# Patient Record
Sex: Female | Born: 1960 | Hispanic: No | Marital: Single | Smoking: Never smoker
Health system: Southern US, Community
[De-identification: ages and names within clinical notes are randomized; demographics above are authoritative.]

## PROBLEM LIST (undated history)

## (undated) DIAGNOSIS — E119 Type 2 diabetes mellitus without complications: Secondary | ICD-10-CM

## (undated) DIAGNOSIS — I10 Essential (primary) hypertension: Secondary | ICD-10-CM

## (undated) DIAGNOSIS — I639 Cerebral infarction, unspecified: Secondary | ICD-10-CM

## (undated) HISTORY — DX: Essential (primary) hypertension: I10

## (undated) HISTORY — PX: ABDOMINAL HYSTERECTOMY: SHX81

## (undated) NOTE — *Deleted (*Deleted)
48 year old female status post stroke with left-sided weakness has been having episodic left-sided chest pain tonight.  Pain will last a few minutes before resolving.  She describes a tight feeling.  There is no associated dyspnea, nausea, diaphoresis.  She does have history of hypertension and there is a positive family history of premature coronary atherosclerosis.  ECG shows no acute process.

---

## 1997-11-30 ENCOUNTER — Other Ambulatory Visit: Admission: RE | Admit: 1997-11-30 | Discharge: 1997-11-30 | Payer: Self-pay | Admitting: Internal Medicine

## 1999-11-07 ENCOUNTER — Emergency Department (HOSPITAL_COMMUNITY): Admission: EM | Admit: 1999-11-07 | Discharge: 1999-11-07 | Payer: Self-pay | Admitting: Emergency Medicine

## 1999-11-07 ENCOUNTER — Encounter: Payer: Self-pay | Admitting: Emergency Medicine

## 2008-01-27 ENCOUNTER — Ambulatory Visit: Payer: Self-pay | Admitting: Pulmonary Disease

## 2008-01-27 ENCOUNTER — Ambulatory Visit: Payer: Self-pay | Admitting: Cardiovascular Disease

## 2008-01-27 ENCOUNTER — Inpatient Hospital Stay (HOSPITAL_COMMUNITY): Admission: EM | Admit: 2008-01-27 | Discharge: 2008-02-04 | Payer: Self-pay | Admitting: Emergency Medicine

## 2008-01-27 ENCOUNTER — Encounter (INDEPENDENT_AMBULATORY_CARE_PROVIDER_SITE_OTHER): Payer: Self-pay | Admitting: Internal Medicine

## 2008-01-27 ENCOUNTER — Ambulatory Visit: Payer: Self-pay | Admitting: Oncology

## 2008-02-18 ENCOUNTER — Ambulatory Visit: Payer: Self-pay | Admitting: Family Medicine

## 2008-02-22 ENCOUNTER — Ambulatory Visit: Payer: Self-pay | Admitting: *Deleted

## 2008-03-01 ENCOUNTER — Ambulatory Visit (HOSPITAL_COMMUNITY): Admission: RE | Admit: 2008-03-01 | Discharge: 2008-03-01 | Payer: Self-pay | Admitting: Family Medicine

## 2008-03-03 ENCOUNTER — Ambulatory Visit: Payer: Self-pay | Admitting: Pulmonary Disease

## 2008-03-03 DIAGNOSIS — R05 Cough: Secondary | ICD-10-CM | POA: Insufficient documentation

## 2008-03-03 DIAGNOSIS — R059 Cough, unspecified: Secondary | ICD-10-CM | POA: Insufficient documentation

## 2008-03-03 DIAGNOSIS — R0902 Hypoxemia: Secondary | ICD-10-CM | POA: Insufficient documentation

## 2008-03-09 ENCOUNTER — Encounter: Payer: Self-pay | Admitting: Obstetrics and Gynecology

## 2008-03-09 ENCOUNTER — Other Ambulatory Visit: Admission: RE | Admit: 2008-03-09 | Discharge: 2008-03-09 | Payer: Self-pay | Admitting: Obstetrics and Gynecology

## 2008-03-09 ENCOUNTER — Ambulatory Visit: Payer: Self-pay | Admitting: Obstetrics and Gynecology

## 2008-03-09 LAB — CONVERTED CEMR LAB
HCT: 33.8 % — ABNORMAL LOW (ref 36.0–46.0)
Hemoglobin: 10.6 g/dL — ABNORMAL LOW (ref 12.0–15.0)
MCHC: 31.4 g/dL (ref 30.0–36.0)
MCV: 82 fL (ref 78.0–100.0)
Platelets: 300 10*3/uL (ref 150–400)
RBC: 4.12 M/uL (ref 3.87–5.11)
RDW: 23.8 % — ABNORMAL HIGH (ref 11.5–15.5)
WBC: 9.4 10*3/uL (ref 4.0–10.5)

## 2008-03-21 ENCOUNTER — Telehealth: Payer: Self-pay | Admitting: Pulmonary Disease

## 2008-03-23 ENCOUNTER — Ambulatory Visit: Payer: Self-pay | Admitting: Obstetrics and Gynecology

## 2008-03-23 ENCOUNTER — Encounter: Payer: Self-pay | Admitting: Pulmonary Disease

## 2008-04-05 ENCOUNTER — Telehealth: Payer: Self-pay | Admitting: Pulmonary Disease

## 2008-05-02 ENCOUNTER — Ambulatory Visit: Payer: Self-pay | Admitting: Pulmonary Disease

## 2008-05-07 ENCOUNTER — Encounter: Payer: Self-pay | Admitting: Pulmonary Disease

## 2008-05-07 ENCOUNTER — Ambulatory Visit (HOSPITAL_BASED_OUTPATIENT_CLINIC_OR_DEPARTMENT_OTHER): Admission: RE | Admit: 2008-05-07 | Discharge: 2008-05-07 | Payer: Self-pay | Admitting: Pulmonary Disease

## 2008-05-09 ENCOUNTER — Ambulatory Visit: Payer: Self-pay | Admitting: Family Medicine

## 2008-05-09 LAB — CONVERTED CEMR LAB
ALT: 14 units/L (ref 0–35)
AST: 17 units/L (ref 0–37)
Albumin: 4 g/dL (ref 3.5–5.2)
Alkaline Phosphatase: 58 units/L (ref 39–117)
BUN: 11 mg/dL (ref 6–23)
Basophils Absolute: 0.1 10*3/uL (ref 0.0–0.1)
Basophils Relative: 1 % (ref 0–1)
CO2: 21 meq/L (ref 19–32)
Calcium: 11 mg/dL — ABNORMAL HIGH (ref 8.4–10.5)
Chloride: 106 meq/L (ref 96–112)
Cholesterol: 156 mg/dL (ref 0–200)
Creatinine, Ser: 0.58 mg/dL (ref 0.40–1.20)
Eosinophils Absolute: 0.3 10*3/uL (ref 0.0–0.7)
Eosinophils Relative: 3 % (ref 0–5)
Free T4: 0.96 ng/dL (ref 0.89–1.80)
Glucose, Bld: 93 mg/dL (ref 70–99)
HCT: 36 % (ref 36.0–46.0)
HDL: 49 mg/dL (ref >39)
Hemoglobin: 11.2 g/dL — ABNORMAL LOW (ref 12.0–15.0)
LDL Cholesterol: 91 mg/dL (ref 0–99)
Lymphocytes Relative: 24 % (ref 12–46)
Lymphs Abs: 2 10*3/uL (ref 0.7–4.0)
MCHC: 31.1 g/dL (ref 30.0–36.0)
MCV: 91.8 fL (ref 78.0–100.0)
Monocytes Absolute: 0.8 10*3/uL (ref 0.1–1.0)
Monocytes Relative: 9 % (ref 3–12)
Neutro Abs: 5.1 10*3/uL (ref 1.7–7.7)
Neutrophils Relative %: 62 % (ref 43–77)
Platelets: 319 10*3/uL (ref 150–400)
Potassium: 3.9 meq/L (ref 3.5–5.3)
RBC: 3.92 M/uL (ref 3.87–5.11)
RDW: 14.6 % (ref 11.5–15.5)
Sodium: 139 meq/L (ref 135–145)
TSH: 2.107 microintl units/mL (ref 0.350–4.50)
Total Bilirubin: 0.2 mg/dL — ABNORMAL LOW (ref 0.3–1.2)
Total CHOL/HDL Ratio: 3.2
Total Protein: 7.7 g/dL (ref 6.0–8.3)
Triglycerides: 78 mg/dL (ref <150)
VLDL: 16 mg/dL (ref 0–40)
Vit D, 1,25-Dihydroxy: 10 — ABNORMAL LOW (ref 30–89)
WBC: 8.2 10*3/uL (ref 4.0–10.5)

## 2008-05-10 ENCOUNTER — Ambulatory Visit: Payer: Self-pay | Admitting: Pulmonary Disease

## 2008-06-27 ENCOUNTER — Ambulatory Visit: Payer: Self-pay | Admitting: Pulmonary Disease

## 2008-06-27 DIAGNOSIS — G4733 Obstructive sleep apnea (adult) (pediatric): Secondary | ICD-10-CM | POA: Insufficient documentation

## 2008-06-27 DIAGNOSIS — J449 Chronic obstructive pulmonary disease, unspecified: Secondary | ICD-10-CM | POA: Insufficient documentation

## 2008-06-27 DIAGNOSIS — J4489 Other specified chronic obstructive pulmonary disease: Secondary | ICD-10-CM

## 2008-06-27 HISTORY — DX: Other specified chronic obstructive pulmonary disease: J44.89

## 2008-06-27 HISTORY — DX: Chronic obstructive pulmonary disease, unspecified: J44.9

## 2008-06-27 HISTORY — DX: Obstructive sleep apnea (adult) (pediatric): G47.33

## 2008-07-21 ENCOUNTER — Encounter: Payer: Self-pay | Admitting: Pulmonary Disease

## 2008-08-14 ENCOUNTER — Ambulatory Visit: Payer: Self-pay | Admitting: Pulmonary Disease

## 2008-08-31 ENCOUNTER — Ambulatory Visit: Payer: Self-pay | Admitting: Obstetrics & Gynecology

## 2008-09-06 ENCOUNTER — Encounter: Payer: Self-pay | Admitting: Pulmonary Disease

## 2008-09-11 ENCOUNTER — Encounter: Payer: Self-pay | Admitting: Pulmonary Disease

## 2008-09-12 ENCOUNTER — Encounter: Payer: Self-pay | Admitting: Pulmonary Disease

## 2008-10-03 ENCOUNTER — Inpatient Hospital Stay (HOSPITAL_COMMUNITY): Admission: RE | Admit: 2008-10-03 | Discharge: 2008-10-05 | Payer: Self-pay | Admitting: Obstetrics & Gynecology

## 2008-10-03 ENCOUNTER — Ambulatory Visit: Payer: Self-pay | Admitting: Obstetrics & Gynecology

## 2008-10-03 ENCOUNTER — Encounter: Payer: Self-pay | Admitting: Obstetrics & Gynecology

## 2008-11-22 ENCOUNTER — Ambulatory Visit: Payer: Self-pay | Admitting: Obstetrics & Gynecology

## 2009-08-29 IMAGING — NM NM PULM PERFUSION & VENT (REBREATHING & WASHOUT)
2 series · 12 of 12 positions shown · non-contrast
Comparison: No priors

CLINICAL DATA: Chest pain/hypoxemia

NUCLEAR MEDICINE VENTILATION AND PERFUSION SCAN
TECHNIQUE: Perfusion images were obtained in multiple projections
after intravenous injection of radiopharmaceutical. Sequential
dynamic ventilation images were obtained in standard planar
projections following inhalation of radiopharmaceutical.
Radiopharmaceutical: 8.2 mCi xenon 133 and 4.9 mCi technetium MAA

[Series 1: vq ventlung · 3.26mm/px · 6 of 17 frames shown (1 of 2)]
[frame 2/17  full-range]
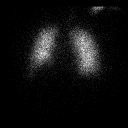
[frame 4/17  full-range]
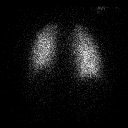
[frame 7/17  full-range]
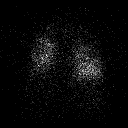
[frame 10/17]
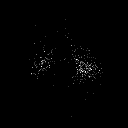
[frame 13/17]
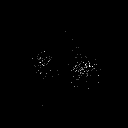
[frame 16/17]
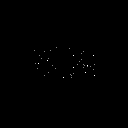

[Series 1: vq ventlung · 3.25mm/px · 6 of 17 frames shown (2 of 2)]
[frame 2/17  full-range]
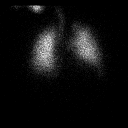
[frame 4/17  full-range]
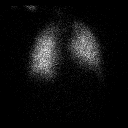
[frame 7/17  full-range]
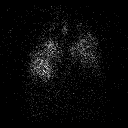
[frame 10/17]
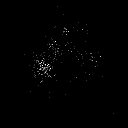
[frame 13/17]
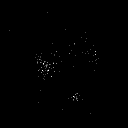
[frame 16/17]
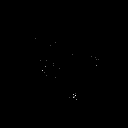

[12 of 12 positions shown; findings below may reference images not displayed]

FINDINGS: There is no significant perfusion defect noted.  There is
normal ventilatory wash in.  There is moderate retention of isotope
at the lung bases on the wash out phase, consistent with an element
of obstructive pulmonary disease.
IMPRESSION: 1.  No evidence for pulmonary emboli or other perfusion
abnormality.
2.  There is moderate retention of isotope at the lung bases
consistent with an element of obstructive pulmonary disease.

## 2009-12-19 ENCOUNTER — Telehealth: Payer: Self-pay | Admitting: Pulmonary Disease

## 2010-06-04 NOTE — Miscellaneous (Signed)
Summary: Overnight oximetry on CPAP 09/06/08  Clinical Lists Changes Test time 6hrs 10 min.  Lowest SpO2 88%, with 8 seconds of test time spent with SpO2 < 90.  Explained to patient over phone that CPAP is effective, but that she needs to be more compliant with therapy.

## 2010-06-04 NOTE — Progress Notes (Signed)
Summary: nos appt  Phone Note Call from Patient   Caller: [email or web address]  Call For: Kisean Rollo Summary of Call: LMTCB x2 to rsc nos from 8/16. Initial call taken by: Darletta Moll,  December 19, 2009 2:30 PM

## 2010-06-04 NOTE — Assessment & Plan Note (Signed)
Summary: f/u sleep results ///kp   PCP:     Chief Complaint:  Followup on sleep study results.  Pt denies any new complaints today.Marland Kitchen  History of Present Illness: I saw Haley Hernandez in hospital follow up for her obstructive lung disease, nocturnal hypoxemia, and to review her sleep test results.  Her breathing has been doing well.  She does not feel her breathing limits her activities in any way.  She denies cough, wheeze, sputum, or chest pain.  She has not needed to use her combivent for several months.  She had her sleep test from May 07, 2008.  This showed an AHI of 11, and oxygen nadir of 78%.    Current Allergies: ! PCN Past Medical History:    COPD         - PFT 05/02/08 FEV1 2.02 (90%), FVC 2.87 (98%), TLC 3.74 (89%), DLCO 81%    OSA         - PSG 05/07/08 AHI 11, O2 nadir 78%    Anemia    Substance abuse    ACE inhibitor cough    HTN    Fibroids   Vital Signs:  Patient Profile:   50 Years Old Female Height:     60 inches Weight:      165.50 pounds O2 Sat:      99 % O2 treatment:    Room Air Temp:     97.9 degrees F oral Pulse rate:   85 / minute BP sitting:   126 / 78  (left arm)  Vitals Entered By: Vernie Murders (June 27, 2008 9:48 AM)                Physical Exam  General:     normal appearance and obese.   Nose:     no deformity, discharge, inflammation, or lesions Mouth:     3+ tonsills, MP 3 airway Neck:     no JVD.   Lungs:     clear bilaterally to auscultation and percussion Heart:     regular rate and rhythm, S1, S2 without murmurs, rubs, gallops, or clicks Abdomen:     bowel sounds positive; abdomen soft and non-tender without masses, or organomegaly Extremities:     no clubbing, cyanosis, edema, or deformity noted Cervical Nodes:     no significant adenopathy   Impression & Recommendations:  Problem # 1:  OBSTRUCTIVE SLEEP APNEA (ICD-327.23) I reviewed her sleep test with her.  I explained how sleep apnea can affect her  health, particularly with regard to her hypertension.  The importance of weight control was reviewed.  Driving precautions were discussed.  I reviewed treatment options for her sleep apnea.  Will proceed with starting her on auto-CPAP titration.  Depending on her response she may need to have an in-lab titration study.  She may also need ENT evaluation for her tonsills if she has difficulty tolerating CPAP.  Problem # 2:  HYPOXEMIA (ICD-799.02) Her nocturnal hypoxemia is most likely related to her sleep disordered breathing, and is being addressed as stated above.  Problem # 3:  COPD (ICD-496) She has very little with regards to symptoms from her COPD.  I have advised her to continue with her as needed use of combivent.  Complete Medication List: 1)  Hydrochlorothiazide 25 Mg Tabs (Hydrochlorothiazide) .Marland Kitchen.. 1 once daily 2)  Norvasc 10 Mg Tabs (Amlodipine besylate) .Marland Kitchen.. 1 once daily 3)  Benicar 40 Mg Tabs (Olmesartan medoxomil) .Marland Kitchen.. 1 once daily 4)  Ferrous  Sulfate 325 (65 Fe) Mg Tabs (Ferrous sulfate) .Marland Kitchen.. 1 two times a day 5)  Combivent 103-18 Mcg/act Aero (Ipratropium-albuterol) .... 2 puffs three times a day as needed 6)  Megestrol Acetate 20 Mg Tabs (Megestrol acetate) .... Once daily  Patient Instructions: 1)  Will set up CPAP machine at home 2)  follow up in 6 to 8 weeks

## 2010-06-04 NOTE — Procedures (Signed)
Summary: Oximetry/[facility]  Oximetry/[facility]   Imported By: Lester Clearfield 06/14/2008 13:26:11  _____________________________________________________________________  External Attachment:    Type:   Image     Comment:   External Document

## 2010-06-04 NOTE — Miscellaneous (Signed)
Summary: Orders Update pft charges  Clinical Lists Changes  Orders: Added new Service order of Carbon Monoxide diffusing w/capacity (94720) - Signed Added new Service order of Lung Volumes (94240) - Signed Added new Service order of Spirometry (Pre & Post) (94060) - Signed 

## 2010-06-04 NOTE — Progress Notes (Signed)
Summary: returning  call from vs  Phone Note Call from Patient   Caller: Patient Call For: Haley Hernandez Summary of Call: pt returning call from dr Karryn Kosinski.  Initial call taken by: Tivis Ringer,  April 05, 2008 4:16 PM  Follow-up for Phone Call        Explained to patient about intermittent oxygen desaturation during ONO, and concern for sleep apnea.  She is agreeable to doing sleep test.  Will set this up, and arrange follow up after review of PSG. Follow-up by: Coralyn Helling MD,  April 10, 2008 2:37 PM

## 2010-06-04 NOTE — Progress Notes (Signed)
Summary: pt wants o2 picked up  Phone Note Call from Patient Call back at Home Phone [phone]   Caller: Patient Call For: Haley Hernandez Summary of Call: pt wants [facility] to pick up her oxygen Initial call taken by: Lacinda Axon,  March 21, 2008 11:00 AM  Follow-up for Phone Call        Spoke with pt.  She states that she never uses o2 and VS is aware of this.  She is requesting that we send order to DME to have tanks picked up.  Having overnight oximetry done tonight.  I advised that we sould probably wait until VS reviews results of overnight before we d/c o2, but pt wants tanks picked up asap.  Please advise if this is okay, thanks Follow-up by: Vernie Murders,  March 21, 2008 11:06 AM  Additional Follow-up for Phone Call Additional follow up Details #1::        Has she had her overnight oximetry done?  I would like to review this first.  If we do not have a copy, then can you please have [facility] send a copy of the report. Additional Follow-up by: Coralyn Helling MD,  March 22, 2008 12:01 AM    Additional Follow-up for Phone Call Additional follow up Details #2::    Pt states she will have ONO tonight. She understands that VS wants to review these results before making a decision about her oxygen. We will ask VS to contact the patient once he receives results of ONO. Follow-up by: Michel Bickers CMA,  March 22, 2008 1:51 PM  Additional Follow-up for Phone Call Additional follow up Details #3:: Details for Additional Follow-up Action Taken: Reviewed ONO on RA.  She does have intermittent episodes of oxygen desaturation.  I am concerned that she may have sleep apnea, and will need a sleep study to further evaluate this.  I have left a message for the patient to call and discuss this further. Additional Follow-up by: Coralyn Helling MD,  April 04, 2008 12:40 PM

## 2010-06-04 NOTE — Assessment & Plan Note (Signed)
Summary: HFU PER PT///KP   Visit Type:  Hospital Follow-up PCP:  None  Chief Complaint:  Followup after hospital discharge.  She c/o body aches x 6 days.  Breathing is well.  No other complaints..  History of Present Illness: I saw Haley Hernandez in hospital follow up for her obstructive lung disease.  She has been doing okay since hospital discharge.  She continues to use supplemental oxygen at night.  She has been using combivent two times a day, and this helps.  She feels her breathing has improved.  She denies using cocaine since her hospitalization.    Serial Vital Signs/Assessments:  Comments: 12:19 PM Ambulatory Pulse Oximetry  Resting; HR__77___    02 Sat__97%ra___  Lap1 (185 feet)   HR__107___   02 Sat__94%ra___ Lap2 (185 feet)   HR__113___   02 Sat__92%ra___    Lap3 (185 feet)   HR__115___   02 Sat__92%ra__  _x__Test Completed without Difficulty ___Test Stopped due to:   By: Vernie Murders      Current Allergies (reviewed today): ! PCN  Past Medical History:    Obstructive lung disease    Anemia    Substance abuse    ACE inhibitor cough    HTN      Past Surgical History:    None   Family History:    Mother - DM, HTN    Brother - Lung cancer  Social History:    Never smoker     ETOH on occ    Single    children    Works in childcare     Vital Signs:  Patient Profile:   50 Years Old Female Weight:      160.13 pounds O2 Sat:      99 % O2 treatment:    Room Air Temp:     97.9 degrees F oral Pulse rate:   76 / minute BP sitting:   118 / 80  (left arm)  Vitals Entered By: Vernie Murders (March 03, 2008 11:47 AM)                 Physical Exam  General:     normal appearance.   Nose:     no deformity, discharge, inflammation, or lesions Mouth:     no deformity or lesions Neck:     no masses, thyromegaly, or abnormal cervical nodes Lungs:     clear bilaterally to auscultation and percussion Heart:     regular rate and  rhythm, S1, S2 without murmurs, rubs, gallops, or clicks Abdomen:     bowel sounds positive; abdomen soft and non-tender without masses, or organomegaly Extremities:     no clubbing, cyanosis, edema, or deformity noted      Impression & Recommendations:  Problem # 1:  DYSPNEA (ICD-786.05) Again I a concerned that she has reactive airways disease.  I have advised her to use her combivent as needed.  If she finds she needs to use this on a regular basis, then she may need to have further adjustments to her inhaler regimen.  I will re-schedule her for PFT's to further evaluate this. Orders: Est. Patient Level II (27253) Full Pulmomary Function Test (PFT)   Problem # 2:  HYPOXEMIA (ICD-799.02) Her oxygen level is normal at rest, and with exertion.   I will schedule her for an overnight oximetry on room air.  If this is abnormal, then she will need to have a sleep test. Orders: Est. Patient Level II (66440) Full Pulmomary Function  Test (PFT) DME Referral (DME)   Problem # 3:  COUGH DUE TO ACE INHIBITORS (ICD-786.2) This has improved since she was switched to an ARB.  Medications Added to Medication List This Visit: 1)  Hydrochlorothiazide 25 Mg Tabs (Hydrochlorothiazide) .Marland Kitchen.. 1 once daily 2)  Norvasc 10 Mg Tabs (Amlodipine besylate) .Marland Kitchen.. 1 once daily 3)  Benicar 40 Mg Tabs (Olmesartan medoxomil) .Marland Kitchen.. 1 once daily 4)  Ferrous Sulfate 325 (65 Fe) Mg Tabs (Ferrous sulfate) .Marland Kitchen.. 1 two times a day 5)  Combivent 103-18 Mcg/act Aero (Ipratropium-albuterol) .... 2 puffs three times a day 6)  Combivent 103-18 Mcg/act Aero (Ipratropium-albuterol) .... 2 puffs three times a day as needed  Complete Medication List: 1)  Hydrochlorothiazide 25 Mg Tabs (Hydrochlorothiazide) .Marland Kitchen.. 1 once daily 2)  Norvasc 10 Mg Tabs (Amlodipine besylate) .Marland Kitchen.. 1 once daily 3)  Benicar 40 Mg Tabs (Olmesartan medoxomil) .Marland Kitchen.. 1 once daily 4)  Ferrous Sulfate 325 (65 Fe) Mg Tabs (Ferrous sulfate) .Marland Kitchen.. 1 two times a  day 5)  Combivent 103-18 Mcg/act Aero (Ipratropium-albuterol) .... 2 puffs three times a day as needed   Patient Instructions: 1)  Overnight oxygen test at home 2)  Use combivent two puffs only as needed upto four times per day 3)  Will schedule breathing test (PFT) 4)  Follow up in one month   ]  Appended Document: HFU PER PT///KP Overnight oximetry from 03/22/08 on room air.  Lowest O2 59% although this may have been artifact.  She did however spend 10% of test with O2 level below 90%, and 3.4% of test with O2 below 88%.  I have left a message for the patient to call to discuss the possibility of undergoing an overnight polysomnogram to further evaluate her nocturnal hypoxemia, and the possibility of sleep apnea.

## 2010-08-12 LAB — CBC
HCT: 23.6 % — ABNORMAL LOW (ref 36.0–46.0)
HCT: 24.3 % — ABNORMAL LOW (ref 36.0–46.0)
Hemoglobin: 7.8 g/dL — CL (ref 12.0–15.0)
Hemoglobin: 8 g/dL — ABNORMAL LOW (ref 12.0–15.0)
MCHC: 32.8 g/dL (ref 30.0–36.0)
MCHC: 32.8 g/dL (ref 30.0–36.0)
MCV: 77 fL — ABNORMAL LOW (ref 78.0–100.0)
MCV: 78.3 fL (ref 78.0–100.0)
Platelets: 229 10*3/uL (ref 150–400)
Platelets: 262 10*3/uL (ref 150–400)
RBC: 3.02 MIL/uL — ABNORMAL LOW (ref 3.87–5.11)
RBC: 3.16 MIL/uL — ABNORMAL LOW (ref 3.87–5.11)
RDW: 23.2 % — ABNORMAL HIGH (ref 11.5–15.5)
RDW: 24 % — ABNORMAL HIGH (ref 11.5–15.5)
WBC: 15.1 10*3/uL — ABNORMAL HIGH (ref 4.0–10.5)
WBC: 9.4 10*3/uL (ref 4.0–10.5)

## 2010-08-12 LAB — TYPE AND SCREEN
ABO/RH(D): O POS
Antibody Screen: NEGATIVE

## 2010-08-12 LAB — ABO/RH: ABO/RH(D): O POS

## 2010-08-13 LAB — CBC
HCT: 26 % — ABNORMAL LOW (ref 36.0–46.0)
Hemoglobin: 8.4 g/dL — ABNORMAL LOW (ref 12.0–15.0)
MCHC: 32.1 g/dL (ref 30.0–36.0)
MCV: 77.6 fL — ABNORMAL LOW (ref 78.0–100.0)
Platelets: 237 10*3/uL (ref 150–400)
RBC: 3.36 MIL/uL — ABNORMAL LOW (ref 3.87–5.11)
RDW: 23.4 % — ABNORMAL HIGH (ref 11.5–15.5)
WBC: 6.6 10*3/uL (ref 4.0–10.5)

## 2010-08-13 LAB — BASIC METABOLIC PANEL
BUN: 4 mg/dL — ABNORMAL LOW (ref 6–23)
CO2: 26 mEq/L (ref 19–32)
Calcium: 10.3 mg/dL (ref 8.4–10.5)
Chloride: 107 mEq/L (ref 96–112)
Creatinine, Ser: 0.71 mg/dL (ref 0.4–1.2)
GFR calc Af Amer: >60 mL/min (ref >60)
GFR calc non Af Amer: >60 mL/min (ref >60)
Glucose, Bld: 101 mg/dL — ABNORMAL HIGH (ref 70–99)
Potassium: 3.8 mEq/L (ref 3.5–5.1)
Sodium: 137 mEq/L (ref 135–145)

## 2010-09-17 NOTE — Consult Note (Signed)
NAME:  Haley Hernandez, Haley Hernandez             ACCOUNT NO.:  [id number]   MEDICAL RECORD NO.:  [id number]          PATIENT TYPE:  INP   LOCATION:  1425                         FACILITY:  [facility]   PHYSICIAN:  Coralyn Helling, MD        DATE OF BIRTH:  [birth year]   DATE OF CONSULTATION:  02/02/2008  DATE OF DISCHARGE:                                 CONSULTATION   REFERRING PHYSICIAN:  Dr. Isidor Holts.   REASON FOR CONSULTATION:  Hypoxemia.   [name] is a 50 year old female who was admitted on September 24  with symptoms of chest pain in the setting of hypertensive urgency.  She  was also noted to be bronchospastic.  It was later revealed that she had  recently done cocaine, and this is demonstrated in her urine drug  screen.  She was also noted to have profound anemia which was felt to be  related to excessive menstrual bleeding.  She does say that she has  problems with chest tightness and wheezing even prior to these recent  events.  She was recovering from her chest pain and anemia when it was  noted that she had oxygen desaturation with ambulation.  An arterial  blood gas was done on room air which demonstrated hypoxemia.  She had a  CT scan of her chest done on September 24 which showed no evidence for  pulmonary embolism or pulmonary parenchymal disease.  She had an  echocardiogram done on September 24 which showed an ejection fraction of  70% with left ventricular hypertrophy and normal right ventricular  function.  She had a chest x-ray on September 29 which showed  cardiomegaly.  She had an arterial blood gas on September 29 which  showed a pH of 7.47, a pCO2 of 46, and a PaO2 of 50.  She also developed  a cough, and there is concern this may related to an ACE inhibitor which  was started for her blood pressure control, and this has been  subsequently switched to an angiotensin receptor blocker.   Her past medical history is unremarkable.   Past surgical history is unremarkable.   She was not on any outpatient  medications.   Her inpatient medications include:  1. Aspirin 81 mg daily.  2. Avelox 400 mg daily.  3. Benicar 40 mg daily.  4. Ferrous sulfate 325 mg b.i.d.  5. Hydrochlorothiazide 25 mg daily.  6. Norvasc 10 mg daily.  7. Protonix 40 mg b.i.d.   SHE HAD AN ALLERGY TO PENICILLIN WHICH CAUSES HER TO DEVELOP HIVES.   FAMILY HISTORY:  Significant for mother who had diabetes and  hypertension and brother had lung cancer.   PHYSICAL EXAMINATION:  She is seen in her hospital room.  She is awake,  alert, does not appear to be acute distress.  Blood pressure is 123/70,  heart rate is 84, respiratory rate 20, temperature is 97.5, oxygen  saturation 97% on 2 liters but was noted to drop to 86% on room air with  ambulation.  HEENT:  Pupils reactive.  External muscles intact.  There is no sinus  tenderness.  No nasal discharge.  No oral lesions.  She has scalloped  border of the tongue.  She has a Mallampati 3 airway.  There is no lymphadenopathy, no thyromegaly, no jugular distention.  HEART:  S1-S2 with an S4.  There is no murmur.  CHEST:  There was mildly prolonged expiratory phase, but there was no  wheezing or rales.  ABDOMEN:  Soft, nontender, positive bowel sounds.  EXTREMITIES:  She has STDs in place.  There is no edema, cyanosis or  clubbing.  NEUROLOGIC EXAM:  She had normal strength.   Hemoglobin 9.2, hematocrit 29.9, platelet count 123, WBC 9.8.  Sodium  137, potassium 2.9, chloride 103, CO2 29, BUN 7, creatinine 0.63,  glucose 103, AST 25, ALT 13, ALP 68, bilirubin 0.7, albumin 3.7.  Cardiac enzymes are negative.  Calcium is 10.9.  TSH was 0.81.   IMPRESSION:  Hypoxemia.  She does have evidence for diastolic  dysfunction on her echocardiogram.  Her CT scan of the chest which was  recently done was unremarkable.  She does have symptoms of reactive  airway disease, and this could be contributing to some of the findings  of hypoxemia.  What I  would recommend is to have her undergo full  pulmonary function tests to include spirometry pre- and post-  bronchodilators lung volume assessment as well as diffusing capacity.  I  will also arrange for her to undergo a VQ scan.  Then, after review of  these results, further recommendations will be made with regards to  additional diagnostic interventions or therapies for her hypoxemia.      Coralyn Helling, MD  Electronically Signed     VS/MEDQ  D:  02/02/2008  T:  02/03/2008  Job:  [id number]

## 2010-09-17 NOTE — Op Note (Signed)
NAME:  Haley Hernandez, Haley Hernandez             ACCOUNT NO.:  [id number]   MEDICAL RECORD NO.:  [id number]          PATIENT TYPE:  INP   LOCATION:  9302                          FACILITY:  [facility]   PHYSICIAN:  Allie Bossier, MD        DATE OF BIRTH:  13-Feb-1961   DATE OF PROCEDURE:  10/03/2008  DATE OF DISCHARGE:                               OPERATIVE REPORT   PREOPERATIVE DIAGNOSES:  1. Menorrhagia.  2. Fibroids.  3. Anemia.  4. Obesity.   POSTOPERATIVE DIAGNOSES:  1. Menorrhagia.  2. Fibroids.  3. Anemia.  4. Obesity.   PROCEDURE:  Total abdominal hysterectomy.   SURGEON:  Allie Bossier, MD   ASSISTANTMichele Mcalpine D. Okey Dupre, MD   ANESTHESIA:  GETA.   ANESTHESIOLOGIST:  Raul Del, MD   COMPLICATIONS:  None.   ESTIMATED BLOOD LOSS:  250 mL.   SPECIMEN:  Greatly enlarged uterus with fibroids.   FINDINGS:  Normal adnexa.   DETAILED PROCEDURE AND FINDINGS:  Preoperatively the risks, benefits,  alternatives of surgery were explained, understood, and accepted.  Consents were signed.  All questions were answered.  She was given her  preop antibiotics in the holding area.  In the operating room, general  anesthesia was applied without complication.  Her abdomen and vagina  were prepped and draped in usual sterile fashion.  Her bladder was  catheterized with a Foley.  This Foley catheter drained clear urine  throughout the case.  A transverse incision was made at the site of her  previous transverse scar.  Incision was carried down to the subcutaneous  tissue to the fascia.  Bleeding encountered was cauterized with the  Bovie.  Fascial incision was scored in the midline.  Fascial incision  was extended bilaterally and curved slightly upward.  The pyramidalis  muscles and approximately 50% of the rectus muscles were separated in  transverse fashion using electrosurgical technique.  The inferior  epigastric vessels were left intact.  The peritoneum was entered with  hemostats.  Peritoneal  incision was extended bilaterally and away from  the bladder with the Bovie.  The greatly enlarged uterus (palpable now  up to 1 cm below the umbilicus) was grasped and brought forth through  the incision where the proximal large 5 cm pedunculated fibroid  separated, but the remainder of the uterus was grossly enlarged with  multiple fibroids.  The utero-ovarian round ligaments were clamped with  Kocher clamps for uterine manipulation.  The adnexa appeared normal.  The utero-ovarian ligaments were then clamped, cut, and doubly ligated  with 2-0 Vicryl sutures used throughout this case unless otherwise  specified.  The round ligaments were clamped, cut, and ligated.  Bladder  flap was created anteriorly.  Bladder was kept well out of the operative  site.  At this point, the uterine vessels were skeletonized, they were  then clamped, cut, and doubly ligated.  The uterus and cervix were  separated from their pelvic attachments using clamp, cut, and ligate  technique.  A curved Heaney sutures were placed at the vaginal cuff  angle, these were ligated using  Heaney sutures as well.  The remainder  of the cervix was removed and noted to be intact.  Remainder of the  vaginal cuff was closed with a 0 Vicryl running locking suture.  Excellent hemostasis was noted.  The pelvis was irrigated with a liter  of warm normal saline.  All pedicles were again inspected and noted to  be hemostatic.  Please note that after removal of the uterus, I did  packed the bowel out of the operative site in order to visualize all the  pedicles as well as the ureters.  The ureters were functioning normally  and were normal size.  During the case, the uterus itself provided most  of the retraction of the bowel, but I did during the case packed the  bowel upwards with 3 moist lap sponges to assure the bowel was out of  the operative site.  She tolerated the procedure well.  After the pelvis  was irrigated, cleaned, and  dried, the rectus muscles and rectus fascia  were inspected.  These were hemostatic as well.  The fascia was closed  with a 0 PDS loop in a running nonlocking fashion.  No defects were  palpable.  The subcu tissue was irrigated, cleaned, and dried.  It was  then infiltrated with 30 mL of 0.5% Marcaine.  A subcuticular closure  was done with 3-0 Vicryl suture.  Steri-Strips were placed.  A Foley  catheter drained clear urine throughout.  She was extubated and taken to  recovery room in stable condition.      Allie Bossier, MD  Electronically Signed     MCD/MEDQ  D:  10/03/2008  T:  10/04/2008  Job:  [id number]

## 2010-09-17 NOTE — Procedures (Signed)
NAME:  Haley Hernandez, Haley Hernandez NO.:  [id number]   MEDICAL RECORD NO.:  [id number]          PATIENT TYPE:  OUT   LOCATION:  SLEEP CENTER                 FACILITY:  [facility]   PHYSICIAN:  Coralyn Helling, MD        DATE OF BIRTH:  [birth year]   DATE OF STUDY:  05/07/2008                            NOCTURNAL POLYSOMNOGRAM   REFERRING PHYSICIAN:  Coralyn Helling, MD   FACILITY:  [facility].   REFERRING PHYSICIAN:  Coralyn Helling, MD   INDICATION FOR STUDY:  [name] is a 50 year old female who has a  history of hypertension and excessive daytime sleepiness.  She was noted  to have nocturnal hypoxemia and overnight oximetry.  She was therefore  referred to the Sleep Lab for evaluation of hypersomnia with obstructive  sleep apnea.  Height is 5 feet, weight 254 pounds, neck size is 13.   EPWORTH SLEEPINESS SCORE:  Epworth score is 5.   MEDICATIONS:  Hydrochlorothiazide, Norvasc, Benicar, ferrous sulfate.   SLEEP ARCHITECTURE:  Total recording time was 425 minutes.  Total sleep  time was 336 minutes.  Sleep efficiency was 79%.  Sleep latency was 37.5  minutes.  REM latency was 32 minutes.  The study was notable for lack of  slow-wave sleep.  The patient slept in both the supine and nonsupine  position.   RESPIRATORY DATA:  The average respiratory rate was 18.  The overall  apnea-hypopnea index was 11.  The events were exclusively obstructive in  nature.  The REM apnea-hypopnea index was 33.  The non REM apnea-  hypopnea index was 6.4.  The supine apnea-hypopnea index was 17.  The  nonsupine apnea-hypopnea was 5.  Moderate snoring was noted by the  technician.   OXYGEN DATA:  The baseline oxygenation was 98%.  The oxygen saturation  nadir was 78%.  The patient spent a total of 5 minutes with an oxygen  saturation less than 90% and 3 minutes with an oxygen saturation less  than 88%.   CARDIAC DATA:  The average heart rate was 90 and the rhythm strip showed  normal sinus  rhythm with sinus tachycardia.   MOVEMENT-PARASOMNIA:  The periodic limb movement index was 1.8 and the  patient had one restroom trip.   IMPRESSIONS-RECOMMENDATIONS:  This study showed evidence for mild-to-  moderate obstructive sleep apnea with apnea-hypopnea index of 11 and  oxygen saturation nadir of 78%.   In addition to diet, exercise, and weight reduction, further therapeutic  interventions could include CPAP therapy, oral appliance, or surgical  intervention.      Coralyn Helling, MD  Diplomat, American Board of Sleep Medicine  Electronically Signed     VS/MEDQ  D:  05/10/2008 11:52:33  T:  05/11/2008 04:05:54  Job:  [id number]

## 2010-09-17 NOTE — Group Therapy Note (Signed)
NAME:  Haley Hernandez, Haley Hernandez NO.:  [id number]   MEDICAL RECORD NO.:  [id number]          PATIENT TYPE:  WOC   LOCATION:  [facility]                   FACILITY:  [facility]   PHYSICIAN:  Argentina Donovan, MD        DATE OF BIRTH:  [birth year]   DATE OF SERVICE:  03/09/2008                                  CLINIC NOTE   The patient is a 50 year old African American female who was referred  from medical doctor because of uterine fibroids and chronic  menometrorrhagia with dysmenorrhea.  Endometrial biopsy was done by  sounding the uterus to 14 cm without incident.  On pelvic examination  the abdomen is soft, flat, nontender, but the uterus is palpable three  fingerbreadths above the symphysis and markedly irregular.  External  genitalia normal, BUS within normal limits.  Vagina is clean and well  rugated.  The cervix is clean, parous and anterior.  Pap smear was taken  as well and bimanual reveals a uterus about [redacted] weeks gestational size,  markedly irregular in configuration.  Adnexa could not be well palpated.  Please refer to the ultrasound.  The patient also has been recently seen  with having had significant medical problems thinking she only had a  cold and ended up with a large medical workup which can be evaluated by  looking at the correspondence material in the chart with chronic  obstructive pulmonary disease and upper respiratory infection.  She also  had severe hypertension for which she is on treatment at this time.  Will have the patient return in 2 weeks for more detailed evaluation and  discussion of future possible hysterectomy.           ______________________________  Argentina Donovan, MD     PR/MEDQ  D:  03/09/2008  T:  03/09/2008  Job:  [id number]

## 2010-09-17 NOTE — Discharge Summary (Signed)
NAME:  Haley Hernandez, Haley Hernandez             ACCOUNT NO.:  [id number]   MEDICAL RECORD NO.:  [id number]          PATIENT TYPE:  INP   LOCATION:  1425                         FACILITY:  [facility]   PHYSICIAN:  Isidor Holts, M.D.  DATE OF BIRTH:  July 04, 1960   DATE OF ADMISSION:  01/27/2008  DATE OF DISCHARGE:  02/04/2008                               DISCHARGE SUMMARY   ADDENDUM:   PMD:  Unassigned.   For discharge diagnoses, refer to interim discharge summary dictated  February 01, 2008, by Dr. Jannette Spanner.  In addition:  1. Chronic obstructive pulmonary disease.  2. Upper respiratory tract infection.   For details of admission history and clinical course, as well as  procedures and consultations, refer to above-mentioned interim summary.  However, for the period from February 02, 2008, to February 04, 2008, the  following are pertinent:  The patient remained clinically stable, blood  pressure was brought under adequate control with a combination of  Hydrochlorothiazide, Norvasc and ARB.  The patient continued on iron  supplementation, hemoglobin remained stable.  She did develop symptoms  of upper respiratory tract infection, i.e. cough productive of  yellowish/greenish phlegm, for which she was commenced on oral Avelox,  and as of February 04, 2008, had completed 3 out of a planned 7-day course  of treatment, which will be continued on an outpatient basis.  Because  of persistent hypoxemia at rest and on exertion, a pulmonary  consultation was called, which was kindly provided by Dr. Coralyn Helling;  for details of this consultation, refer to consultation notes of  February 02, 2008.  He concluded that she did have features of reactive  airway disease and ordered a ventilation-perfusion lung scan and chest x-  ray on February 03, 2008.  Chest x-ray showed no acute cardiopulmonary  findings.  Ventilation-perfusion lung scan showed no evidence of  pulmonary emboli or other perfusion abnormality.   There was, however,  moderate extension of isotope at the lung bases consistent with an  element of obstructive pulmonary disease.  Dr. Craige Cotta has recommended  Combivent inhaler.  The patient clinically improved, although she was  unable to perform pulmonary function testing because she developed chest  pain during the course of this procedure, and it had to be discontinued.  She desaturated on ambulation, at between 86 and 88%.  However, over the  course of the next few days her oxygen saturation on room air improved,  so that she was saturating consistently above 90% as of February 03, 2008.  As matter of fact, in the morning of February 04, 2008, she was sating at  100% on room air.  She felt clinically much better and was keen to be  discharged.  Hemoglobin as of February 03, 2008, was 9.7 with hematocrit  of 31.7.   DISPOSITION:  The patient was on February 04, 2008, considered clinically  stable for discharge.  She was, therefore, discharged accordingly.   DIET:  Heart-healthy.   ACTIVITY:  As tolerated, otherwise within the limits imposed by  shortness of breath.   FOLLOW-UP INSTRUCTIONS:  The patient has been instructed to  follow up  with Dr. Coralyn Helling, pulmonary medicine, in 2 weeks.  Appropriate  information has been provided.  She has also been instructed to follow  up with her gynecologist at the GYN clinic. Arrangements have already  been put in place.   SPECIAL INSTRUCTIONS:  The patient has been arranged short-term home  oxygen.      Isidor Holts, M.D.  Electronically Signed     CO/MEDQ  D:  02/04/2008  T:  02/04/2008  Job:  [id number]   cc:   Coralyn Helling, MD  [address]  [place], [place] [address]

## 2010-09-17 NOTE — Group Therapy Note (Signed)
NAME:  Haley Hernandez, Haley Hernandez NO.:  [id number]   MEDICAL RECORD NO.:  [id number]          PATIENT TYPE:  WOC   LOCATION:  [facility]                   FACILITY:  [facility]   PHYSICIAN:  Allie Bossier, MD        DATE OF BIRTH:  06/26/1960   DATE OF SERVICE:                                  CLINIC NOTE   HISTORY:  Haley Hernandez is a 50 year old single black gravida 3, para 3  with 2 living children.  She is seen here as a surgical consult for her  complaint of uterine fibroids and dysmenorrhea.  She says that she has  been bleeding essentially since December and a hemoglobin was done on  March 09, 2008 and it was 10.6 with a normal platelet count.  She was  prescribed iron.  In the meantime, an ultrasound was done which showed  multiple uterine fibroids, the largest at the mid-lower uterine segment  and 5 cm in size.  Several of them are of submucosal position.  Her  uterus length is 13.7 cm.  Endometrial biopsy and Pap smear were done  along with a mammogram, all of these were normal and she wants a  hysterectomy.  We discussed leaving her ovaries in situ versus removing  them and the risks and benefits of each.  She has decided she would like  to keep her ovaries in situ.   PAST MEDICAL HISTORY:  1. Uterine fibroids.  2. Dysmenorrhea.  3. Sleep apnea (she is cared for by Dr. Craige Cotta).  4. Hypertension.  She is [facility] for at [facility].  5. She is overweight.  6. She is anemic.  7. Menometrorrhagia.   REVIEW OF SYSTEMS:  She has been abstinent since October 2009 due to the  continuous bleeding, but she has been monogamous with her boyfriend for  the last 8 years.  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  She  denies dyspareunia and is able to achieve an orgasm when she is sexually  active.  She does now use a CPAP machine for her sleep apnea.  She is  interested in weight loss, but has yet to do this.   MEDICATIONS:  1. Hydrochlorothiazide 25 mg daily.  2. Norvasc  10 mg daily.  3. Benicar 80 mg daily.  4. Vitamin D 125 mg daily.  5. Iron pills twice a day.  6. She keeps a Combivent inhaler with her, but rarely uses this.   PAST SURGICAL HISTORY:  Tubal ligation.   FAMILY HISTORY:  Negative for breast, GYN and colon malignancies.   ALLERGIES:  No latex allergies.   MEDICINE ALLERGIES:  PENICILLIN.   PHYSICAL EXAMINATION:  VITAL SIGNS:  Weight 166, height 5 feet, blood  pressure 166/88, pulse 103.  HEENT:  Normal.  BREAST:  Normal.  HEART:  Regular rate and rhythm.  LUNGS:  Clear to auscultation bilaterally.  ABDOMEN:  Obese, benign.  GU:  Her uterus is palpable up to about 3 cm below the umbilicus, but is  very mobile and I feel that it can be easily removed through a Maylard  incision.   ASSESSMENT/PLAN:  1. Symptomatic uterine fibroids with anemia.  2. Dysmenorrhea.  3. Menometrorrhagia.   She understands the risks associated with hysterectomy include, but are  not limited to infection, bleeding, damage to bowel, bladder, ureter and  death.  She wishes to proceed.      Allie Bossier, MD     MCD/MEDQ  D:  08/31/2008  T:  08/31/2008  Job:  [id number]

## 2010-09-17 NOTE — H&P (Signed)
NAME:  Haley Hernandez, Haley Hernandez NO.:  [id number]   MEDICAL RECORD NO.:  [id number]          PATIENT TYPE:  EMS   LOCATION:  ED                           FACILITY:  [facility]   PHYSICIAN:  Vania Rea, M.D. DATE OF BIRTH:  1960-12-07   DATE OF ADMISSION:  01/27/2008  DATE OF DISCHARGE:                              HISTORY & PHYSICAL   PRIMARY CARE PHYSICIAN:  Unassigned.   CHIEF COMPLAINT:  Chest pain, coughing and shortness of breath since  yesterday morning.   HISTORY OF PRESENT ILLNESS:  This is a 50 year old African American lady  with no medical follow-up and who takes no medications; but who has been  having menorrhagia for many years.  She has sought no medical help for  this.  In any event, yesterday morning she woke up with coughing,  shortness of breath and chest discomfort; associated with sore throat  also.  She thought it was some sort of virus and it would eventually  resolve without treatment; but, on retiring to bed yesterday evening she  started experiencing severe central chest pain that did not appear to  have any aggravating or relieving factors, and she decided to come to  the emergency room.   The patient, at her baseline, has severe dyspnea on exertion -- which is  climbing one flight of stairs and is on the whole a rather sedentary  person.  Her menorrhagia takes the form of 7 heavy days of menstruation,  during which she uses about 8 pads as well as tampons.  Additionally,  the patient says she used cocaine for the first time about 2 or 3 days  ago.  However, the patient's story of cocaine use is sort of shifting,  as the days she used it are shifting between Sunday, Monday and Tuesday  depending on how it is presented to her.  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   PAST MEDICAL HISTORY:  None.   MEDICATIONS:  None.   ALLERGIES:  PENICILLIN causes hives.   SOCIAL HISTORY:  As noted  above.   FAMILY HISTORY:  Significant for mother with diabetes and hypertension.  Brother with cancer of the lung.   REVIEW OF SYSTEMS:  Significant for headache and sore throat starting  yesterday morning.  Menorrhagia as noted above.  Dyspnea on exertion, as  noted above.  No history of swelling of the legs.  Other than this, a 10-  point review of systems is unremarkable.   PHYSICAL EXAMINATION:  A middle-aged African American lady lying in bed;  appears somewhat dyspneic, appears somewhat short of breath.  VITALS:  Temperature 97.4, pulse 122, respiration 22, blood pressure  204/102.  She is saturating at 98% on 2 liters.  Her pupils are round, equal and reactive.  Mucous membranes pale.  She  is anicteric.  She has no cervical lymphadenopathy or thyromegaly.  No  carotid bruit.  CHEST:  She has occasional rhonchi bilaterally.  CARDIOVASCULAR SYSTEM.  She is tachycardiac.  No murmurs heard.  ABDOMEN:  Obese and soft.  No masses are felt.  EXTREMITIES:  She has trace edema bilaterally.  She has 3+ pounding  pulses bilaterally.  CENTRAL NERVOUS SYSTEM:  Cranial nerves II-XII are grossly intact.  She  has no focal neurologic deficit.   LABS:  White count is elevated at 13.3, hemoglobin 6.8, MCV 61.3,  platelet count 57.  Sodium 133, potassium 3.6, chloride 102, CO2 26, BUN  5, creatinine 0.56, glucose 114.  Her stool is negative for occult  blood.  No urinalysis is seen.  Her cardiac enzymes shows undetectable  CK-MB, undetectable troponins and normal myoglobin.  Her urine drug  screen is positive for cocaine.  Her D-dimer is marginally elevated at  0.62.   ASSESSMENT:  1. Hypertensive urgency.  2. Severe anemia.  3. Cocaine abuse.  4. Chest pain associated with severe anemia and uncontrolled      hypertension, with cocaine abuse.  5. Thrombocytopenia of unclear etiology.  6. Acute bronchospasm without past history of asthma.   PLAN:  Will admit this lady to step down on a  nitroglycerin drip.  We  will give bronchodilators with Xopenex.  She has already had an anemia  panel drawn and transfusion is in process.  We will control her blood  pressure with nitroglycerin.  We will give sedatives with  benzodiazepines and will start Procardia.  She may benefit from an  OB/GYN evaluation, although her stage III hypertension would certainly  explain her menorrhagia.  Other plans as per orders.      Vania Rea, M.D.  Electronically Signed     LC/MEDQ  D:  01/27/2008  T:  01/27/2008  Job:  [id number]

## 2010-09-17 NOTE — Group Therapy Note (Signed)
NAME:  Haley Hernandez, Haley Hernandez NO.:  [id number]   MEDICAL RECORD NO.:  [id number]           PATIENT TYPE:   [facility]ATION:  [facility]                     FACILITY:   PHYSICIAN:  Johnella Moloney, MD        DATE OF BIRTH:  06-18-1960   DATE OF SERVICE:  03/22/2008                                  CLINIC NOTE   REASON FOR VISIT:  Patient is here for followup on endometrial biopsy  and Pap smear results.   HISTORY OF PRESENT ILLNESS:  [name] is a 50 year old  gravida 3, para 2-0-1-2 who presents today for further followup on  chronic menometrorrhagia with dysmenorrhea.  Approximately 2 weeks ago,  she had an endometrial biopsy done with Pap smear.  The endometrial  biopsy revealed benign early secretory phase endometrium and the Pap  smear was negative.  The patient previously had a pelvic ultrasound  which revealed a thickened endometrial complex with multiple uterine  fibroids as big as 5 cm and a uterus measuring 14 cm in length.  Given  her chronic dysmenorrhea, as well as moderate bleeding, it is  recommended the patient have either a hypothermic ablation or  potentially a total abdominal hysterectomy.   Blood pressure today is 146/84, heart rate 97, respiratory rate 20, temp  97.8.  Patient is 5 feet 0 inches tall and weighs 155 pounds.   ASSESSMENT AND PLAN:  Patient has uterine fibroids and dysmenorrhea  secondary to her uterine fibroids.  The patient does not have insurance  so she was instructed to fill out an application for financial aid and  submit for 5 financial counseling.  After she goes through this process,  she will meet for surgical evaluation to discuss the options of  hypothermic ablation versus a total abdominal hysterectomy.  Patient was  given a prescription of Megace 20 mg taken twice a day.  Of note, on her  previous visit, patient was found to have a hemoglobin of 10.6 with an  RDW of 23.8.  I discussed that with the patient and I  discussed the  importance of iron supplementation, as well as dietary iron.  The  patient was instructed to make a followup appointment after she has her  financial aid packet submitted.     ______________________________  Haley Hernandez, D.O.    ______________________________  Johnella Moloney, MD    MC/MEDQ  D:  03/23/2008  T:  03/23/2008  Job:  [id number]

## 2010-09-17 NOTE — Group Therapy Note (Signed)
NAME:  Haley Hernandez, Haley Hernandez             ACCOUNT NO.:  [id number]   MEDICAL RECORD NO.:  [id number]          PATIENT TYPE:  INP   LOCATION:  1425                         FACILITY:  [facility]   PHYSICIAN:  Michelene Gardener, MD    DATE OF BIRTH:  1961-01-13                                 PROGRESS NOTE   CURRENT DIAGNOSES:  1. Accelerated hypertension.  2. Iron-deficiency anemia secondary to heavy periods.  3. Thrombocytopenia.  4. History of cocaine abuse.  5. Hypoxemia.   DISCHARGE MEDICATIONS:  To be determined by discharging physician.   CONSULTATIONS:  1. Hematology consult.  2. Cardiology consult.  3. GYN consult.   PROCEDURES.:  1. Chest x-ray on Haley Hernandez 23 showed cardiomegaly.  2. CT angiography on Haley Hernandez 24 showed no evidence of PE and no      evidence of dissection.  3. Pelvic ultrasound Haley Hernandez 26 showed significant enlarged uterus      containing multiple uterine fibroids and marked thickened      endometrium around 2.7 cm in diameter.  4. Abdominal ultrasound Haley Hernandez 28 showed no acute problems.  5. Echocardiogram on Haley Hernandez 24 showed ejection fraction 70-80% with      no left ventricular wall motion abnormalities.   COURSE OF HOSPITALIZATION:  1. Accelerated hypertension.  This patient's blood pressure was      204/102 on the time of admission.  The patient was admitted to the      intensive care unit.  The patient was started on nitroglycerin      drip.  Oral medications were introduced.  Through the      hospitalization, medications have been titrated.  Nitroglycerin      drip was discontinued.  Oral medications including      hydrochlorothiazide, lisinopril and Norvasc were started.  As of      today, the patient has acceptable control of her blood pressure      with systolic blood pressure ranging between 120 up to 140.  I had      a prolonged discussion with her regarding compliance with her      medications and with medical follow-up, and the patient  voiced      understanding.  2. Anemia.  On the time of presentation, hemoglobin was 6.8.  Upon      further questioning, the patient has been having prolonged periods      with heavy bleeding during those periods.  Pelvic ultrasound was      done, and it showed multiple fibroids with thickened endometrium      suggestive of hyperplasia versus tumor.  I discussed the case with      Dr. Hyacinth Meeker ( the GYN doctor on call) who suggested to see her as an      outpatient in the GYN clinic.  I explained this to the patient.  I      gave her the number of the GYN clinic, and it was written in her      discharge sheet.  The patient will contact GYN clinic for possible      endometrial biopsy as an outpatient.  The patient was also      evaluated by hematology. The pt received initially 2 units of      packed RBCs.  Then, another unit was required later on during the      hospitalization.  The patient was also given IV iron by hematology.      Following that, I started the patient on ferrous sulfate 325 mg      twice daily.  Last hemoglobin was done on Haley Hernandez 29, and it was      9.3.  No bleeding in the hospital.  3. Thrombocytopenia.  As per hematology, this might be secondary to      iron-deficiency anemia versus ITP.  Platelets remain stable in the      hospital, and the patient does not need further investigation at      this time.  4. Hypoxemia.  The pt cont to require oxygen. Oxygen was taken today      9/25 and her saturation dropped to mid 80s.  I did an ABG on her      that showed hypoxemia with pO2 of 50 and oxygen of 84 on room air.      As mentioned above, the patient already underwent 2 chest x-rays      and CT angiography, and they were negative.  Also, echocardiogram      was done and again was negative.  Repeat CXR is ordered for today.      Will try to taper oxygen as tolerated. If pt remains hypoxic, then      she might need pulmonary evaluation.  5. Cocaine abuse.  The patient  was advised about the risk.   DISPOSITION:  This patient made very good improvement during the hospitalization.  Her  blood pressure is currently under control on 3 medications that include  Norvasc, lisinopril and hydrochlorothiazide.  The patient started to  develop cough, and I am not very quite sure that this is coming from  lung problem versus her lisinopril.  I ordered a chest x-ray for today  which is currently pending. If the CXR is neg and her cough continues,  then we will consider switching lisinopril to a different agent.  Regarding her anemia, she did not have bleeding during the hospital and  her hemoglobin has remained stable.  Arrangements were made to follow  with GYN clinic as an outpatient.  I think this patient will be able to  go home within 1-2 days if she is able to maintain good saturation on  room air.  If the patient remains hypoxic, then I would suggest  pulmonary evaluation.  Updated course of hospitalization and updated  discharge medications will be updated by the discharging physician   Total assessment time is 40 minutes.      Michelene Gardener, MD  Electronically Signed     NAE/MEDQ  D:  02/01/2008  T:  02/01/2008  Job:  [id number]

## 2010-09-17 NOTE — Consult Note (Signed)
NAME:  Haley Hernandez, ZEHRING NO.:  [id number]   MEDICAL RECORD NO.:  [id number]          PATIENT TYPE:  INP   LOCATION:  1235                         FACILITY:  [facility]   PHYSICIAN:  Verne Carrow, MDDATE OF BIRTH:  [birth year]   DATE OF CONSULTATION:  01/27/2008  DATE OF DISCHARGE:                                 CONSULTATION   CONSULTING PHYSICIAN:  [facility].   REASON FOR CONSULTATION:  Chest pain.   HISTORY OF PRESENT ILLNESS:  Ms. Vo is a pleasant 50 year old  African American female with no known past medical history except for  cocaine use, who was admitted earlier this morning with complaints of  coughing, shortness of breath, and substernal chest pain over the last  24 hours.  The patient states that she was in her normal state of good  health until yesterday morning, when she began to cough and noticed  associated dyspnea with substernal chest pain.  There was no radiation  of the pain or associated diaphoresis or nausea.  It sounds like the  patient mainly presented to the emergency department secondary to her  shortness of breath.  She denies having had any production of mucous  with her cough, fever, chills, near syncope, syncope, dysuria, or change  in her bowel habits.  She also denies any unilateral lower extremity  swelling or recent immobility like a long car trip.  Upon further  questioning, she tells me that her chest pain seems to be worsened with  deep inspiration.  There also seems to be a positional component to her  chest pain.  Currently, she states that her pain is very mild, but is  still present in the center of her chest.  Of note, on arrival to the  emergency department this morning, the patient was found to be  hypertensive with a blood pressure of 204/102.   PAST MEDICAL HISTORY:  Heavy menstrual cycles, but no other medical  problems.   PAST SURGICAL HISTORY:  Ankle surgery.   ALLERGIES:  PENICILLIN.   HOME  MEDICATIONS:  None.   CURRENT MEDICATIONS:  1. Norvasc 5 mg once daily.  2. Aspirin 81 mg once daily.  3. Hydrochlorothiazide 25 mg once daily.  4. Procardia 90 mg once daily.  5. Protonix 40 mg twice daily.   SOCIAL HISTORY:  The patient admits to using alcohol socially, but  denies the use of tobacco.  She denies to me having used any illicit  drugs.  However, the medical record states that she admitted earlier  today using cocaine over the last several days.  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  She has a 2 adult children that are  ages 62 and 38.   FAMILY HISTORY:  The patient's mother died from an unknown cause, but  had diabetes mellitus.  The patient's father and brother both died from  cancer.  There does not seem to be a family history of premature  coronary artery disease.   REVIEW OF SYSTEMS:  As stated in the history of present illness and is  otherwise negative.  PHYSICAL EXAMINATION:  VITALS:  Temperature 97.9, pulse 107, blood  pressure 173/83, and respiratory rate 16-20.  GENERAL:  Alert and oriented x3 in no acute distress.  NECK:  No JVD.  No carotid bruits.  No thyromegaly.  No lymphadenopathy.  OROPHARYNX:  Clear.  Mucous membranes moist.  LUNGS:  Clear to auscultation bilaterally without wheezes, rhonchi, or  crackles noted.  CARDIOVASCULAR:  Tachycardic, regular rhythm.  Systolic flow murmur  noted over the precordium.  There are no gallops or rubs noted.  ABDOMEN:  Soft and nontender.  Bowel sounds present.  EXTREMITIES:  No evidence of edema.  Pulses are 2+ in all extremities.   DIAGNOSTIC STUDIES:  1. Laboratory values on admission showed hemoglobin of 6.8.  Following      blood transfusion, her hemoglobin is 9.1.  White blood cell count      13.2, platelets 57,000; potassium 3.5, creatinine 0.7, calcium      10.1; INR 1.1, D-dimer 0.41, troponin negative x3.  2. A 12-lead electrocardiogram shows sinus tachycardia, but not       specific ST and T-wave changes.  3. CT angiogram of the chest shows no evidence of pulmonary embolism,      no evidence of aortic dissection or acute pulmonary process.   ASSESSMENT AND PLAN:  This is a 50 year old African American female  admitted with hypertensive urgency, tachycardia, found to be anemic, and  complaining of cough, shortness of breath, and chest pain.   Chest pain - I think that the patient's chest pain sounds atypical and  noncardiac in etiology.  Most likely, her chest pain is secondary to her  hypertensive urgency and anemia.  Atypical features of her chest pain  are it is worsened with inspiration and changes with position.  It has  also been constant over the last 24 hours.  Her cardiac enzymes have  been negative x3.  Her EKG is not suggestive of ischemia.  The  differential for her chest pain includes pulmonary embolism; however,  the patient had CT angiogram of her chest earlier that showed no  evidence of pulmonary embolus.  There is also no evidence of aortic  dissection on this study.  I agree with transfusing the patient with  packed red blood cells for her anemia.  Currently, she is on  nitroglycerin drip for hypertension.  Her echocardiogram has been  performed, the results are pending at this time.  We will follow up on  her echocardiogram.  We will make further recommendations at that time.  It is most likely that the patient would benefit from a stress test,  when her other issues including anemia and hypertension are more stable.  I feel that her tachycardia is most likely secondary to her anemia and  it should resolve when her blood counts are more stable.   The Cardiology team will continue to follow along with this very  pleasant lady.      Verne Carrow, MD  Electronically Signed     CM/MEDQ  D:  01/27/2008  T:  01/28/2008  Job:  [id number]

## 2010-09-17 NOTE — Consult Note (Signed)
NAME:  Haley Hernandez, Haley Hernandez NO.:  [id number]   MEDICAL RECORD NO.:  [id number]          PATIENT TYPE:  INP   LOCATION:  1235                         FACILITY:  [facility]   PHYSICIAN:  Samul Dada, M.D.DATE OF BIRTH:  1960/11/17   DATE OF CONSULTATION:  01/27/2008  DATE OF DISCHARGE:                                 CONSULTATION   REASON FOR CONSULTATION:  Anemia and low platelet count.   REQUESTING PHYSICIAN:  Michelene Gardener, MD.   HISTORY OF PRESENT ILLNESS:  Haley Hernandez is a 50 year old African  American female with no known prior medical history; been on no  medications, except cocaine which she has admitted to using for the  first time about 2-3 days ago  prior to admission.  Seen in the  emergency department with acute onset of shortness of breath, chest pain  and nonproductive cough.  A CT angio of the chest on January 27, 2008  was negative for PE, no acute findings.  Workup included a CBC, which  reveals severe anemia with an H&H of 6.8 and 23.6.  The platelets on  admission were 57.  She received transfusion of 3 units of packed RBCs,  with an elevation of her H&H to 9.1 and 30.3 respectively.  Her ferritin  was 1.  MCV 67.1.  Reticulocytes were normal at 28.9.  B12 386 and folic  acid greater than 20.  Hemoccult was negative.  The patient has a  history of heavy menses, with periods lasting 8 days.  Her last  menstrual period was on January 08, 2008.  The platelets were noticed  to be decreased at 57,000, with no other labs being available for  comparison.  The smear was ordered for review.  We were asked to see  her, with recommendations regarding her care.   PAST MEDICAL HISTORY:  1. Menorrhagia.  2. Recent history of cocaine use.   SURGERY:  None.   ALLERGIES:  PENICILLIN.   MEDICATIONS:  1. Procardia as directed.  2. Aspirin,  3. Valium.  4. Xopenex.  5. Solu-Medrol.  6. Morphine sulfate.  7. Nitroglycerin.  8. Protonix.  9.  Zofran.  10.Oxycodone.   REVIEW OF SYSTEMS:  Remarkable for dyspnea on exertion, nonproductive  cough, chest pain on admission which has now improved, occasional GERD  symptoms.  She has had fatigue, but no weight loss.  No decrease in  appetite.  The rest of the review of systems is negative.   FAMILY HISTORY:  Mother died with diabetes and hypertension.  Father  died with some type of cancer.  One brother alive with a history of  cancer.   SOCIAL HISTORY:  The patient is married.  She has 2 grown children, ages  77 and 76.  No tobacco history.  No alcohol history.  She admits to have  taken cocaine for the first time about 3 days prior to admission.  The  patient lives in [place].  She is Control and instrumentation engineer.   PHYSICAL EXAMINATION:  She is a moderately obese 50 year old African  American female in no acute distress, alert and oriented x3.  Blood  pressure 150/76, pulse 109, respirations 27, temperature 98.1, pulse  oximetry 98% on 2 liters.  HEENT:  Normocephalic, atraumatic.  Sclerae anicteric.  Oral cavity  without lesions or thrush.  NECK:  Supple.  No cervical or  supraclavicular masses.  LUNGS:  Remarkable for bilateral rhonchi.  No wheezing or rales.  No  axillary masses.  CARDIOVASCULAR:  Tachycardic; regular rate and rhythm  without murmurs, rubs or gallops.  Occasional ectopic beat.  ABDOMEN:  Soft, nontender.  Bowel sounds x4.  No hepatosplenomegaly.  No  spider veins.  EXTREMITIES:  No clubbing or cyanosis.  No edema.  No inguinal masses.  SKIN:  Without bruising or petechial rash.  BREASTS:  Not examined.  GU and RECTAL:  Deferred.  MUSCULOSKELETAL:  No spinal tenderness.  NEUROLOGIC:  Nonfocal.   LABS:  Hemoglobin 9.1, hematocrit 30.3, white count 13.2, platelets 57,  MCV 67.1, ANC 111.6, monocytes 0.5, lymphocytes 0.8, reticulocyte  absolute is normal at 28.9.  Folic acid greater than 20, B12 386, iron  12, TIBC 427, percent saturation 3, ferritin 1, D-dimer 0.6.   Sodium  133, potassium 3.6, BUN 5, creatinine 0.56, glucose 114, direct  bilirubin 0.1, indirect bilirubin 0.6, total bilirubin 0.7, alkaline  phosphatase 60, AST 25, ALT 13, total protein 7.8.  Albumin 3.7, TSH  pending.  Hemoccult negative.  Urine drug screen positive for cocaine on  January 27, 2008.  Homocysteine cardiac panel and lipid profile are  pending.  Troponin is negative.   ASSESSMENT/PLAN:  Dr. Arline Asp has seen and evaluated the patient and  reviewed the chart.  This is a 50 year old woman, asked to see for  evaluation of anemia secondary to iron deficiency related to heavy  menses.  The hemoglobin was 6.8, which has risen to 9.1 after units of  packed RBCs.  The etiology of the thrombocytopenia is unclear, could be  secondary to severe iron deficiency or ITP.  Will check her HIV status.  Will give IV iron in the morning.  Would check CBC again.  Her cocaine  use of may have been, as well, a cause of her thrombocytopenia.  Thank  you very much for allowing [place] the opportunity to participate in the care  of this patient.      Marlowe Kays, P.A.      Samul Dada, M.D.  Electronically Signed    SW/MEDQ  D:  01/28/2008  T:  01/28/2008  Job:  [id number]

## 2010-09-20 NOTE — Discharge Summary (Signed)
NAME:  Haley Hernandez, Haley Hernandez             ACCOUNT NO.:  [id number]   MEDICAL RECORD NO.:  [id number]          PATIENT TYPE:  INP   LOCATION:  9302                          FACILITY:  [facility]   PHYSICIAN:  Allie Bossier, MD        DATE OF BIRTH:  January 20, 1961   DATE OF ADMISSION:  10/03/2008  DATE OF DISCHARGE:  10/05/2008                               DISCHARGE SUMMARY   ADMISSION DIAGNOSES:  Symptomatic uterine fibroids and anemia  (hemoglobin 8.4).   DISCHARGE DIAGNOSES:  Symptomatic uterine fibroids and anemia  (hemoglobin 8.4).   CONDITION:  Stable.   DISPOSITION ON DIET:  As tolerated.   ACTIVITY:  As tolerated.  She is not to drive a car or have intercourse.  She is not to drive a car while taking narcotics, and she is not to have  intercourse until 6 weeks and after being seen in the office.   Followup in 6 weeks at the office p.r.n. or sooner.   MEDICATIONS:  Her home medications plus her new prescription of Percocet  325 mg/5 mg one p.o. q.4 h p.r.n. pain, #30, no refills.   HISTORY OF PRESENT ILLNESS AND HOSPITAL COURSE:  Haley Hernandez is a 50-  year-old black lady with symptomatic uterine fibroid.  She has been  having heavy bleeding and was seen in the GYN Clinic.  Her uterus on  ultrasound and physical exam was noted to be grossly enlarged (nearly up  to the umbilicus).  Her hemoglobin preoperatively was 8.4, postop day #1  it was 8.0 and postop day #2 it was 7.8.  She remained afebrile  throughout her hospital course.  Please note, she did undergo total  abdominal hysterectomy.  Findings included grossly enlarged uterus but  normal-appearing adnexa, they were certainly left in situ.  EBL was  approximately 250 mL.  By postoperative day #1, she was voiding without  dysuria, ambulating, tolerating p.o. without nausea or vomiting.  Physical exam on both days revealed a benign abdomen with normoactive  bowel sounds and some pain and no erythema of her incision.  She was  discharged home in stable condition, will follow up as above.      Allie Bossier, MD  Electronically Signed     MCD/MEDQ  D:  10/13/2008  T:  10/14/2008  Job:  [id number]

## 2011-02-03 LAB — BASIC METABOLIC PANEL
BUN: 4 — ABNORMAL LOW
BUN: 5 — ABNORMAL LOW
BUN: 7
BUN: 7
CO2: 20
CO2: 26
CO2: 26
CO2: 29
Calcium: 10.1
Calcium: 10.3
Calcium: 10.4
Calcium: 10.9 — ABNORMAL HIGH
Chloride: 102
Chloride: 102
Chloride: 102
Chloride: 103
Creatinine, Ser: 0.56
Creatinine, Ser: 0.63
Creatinine, Ser: 0.68
Creatinine, Ser: 0.71
GFR calc Af Amer: >60
GFR calc Af Amer: >60
GFR calc Af Amer: >60
GFR calc Af Amer: >60
GFR calc non Af Amer: >60
GFR calc non Af Amer: >60
GFR calc non Af Amer: >60
GFR calc non Af Amer: >60
Glucose, Bld: 103 — ABNORMAL HIGH
Glucose, Bld: 114 — ABNORMAL HIGH
Glucose, Bld: 121 — ABNORMAL HIGH
Glucose, Bld: 187 — ABNORMAL HIGH
Potassium: 3.1 — ABNORMAL LOW
Potassium: 3.5
Potassium: 3.6
Potassium: 3.9
Sodium: 133 — ABNORMAL LOW
Sodium: 134 — ABNORMAL LOW
Sodium: 135
Sodium: 137

## 2011-02-03 LAB — CBC
HCT: 23.6 — ABNORMAL LOW
HCT: 26.3 — ABNORMAL LOW
HCT: 26.4 — ABNORMAL LOW
HCT: 28.2 — ABNORMAL LOW
HCT: 29.9 — ABNORMAL LOW
HCT: 30.3 — ABNORMAL LOW
Hemoglobin: 6.8 — CL
Hemoglobin: 8 — ABNORMAL LOW
Hemoglobin: 8.1 — ABNORMAL LOW
Hemoglobin: 8.5 — ABNORMAL LOW
Hemoglobin: 9.1 — ABNORMAL LOW
Hemoglobin: 9.2 — ABNORMAL LOW
MCHC: 28.8 — ABNORMAL LOW
MCHC: 30.2
MCHC: 30.2
MCHC: 30.3
MCHC: 30.6
MCHC: 30.6
MCV: 61.8 — ABNORMAL LOW
MCV: 66.8 — ABNORMAL LOW
MCV: 67.1 — ABNORMAL LOW
MCV: 68.5 — ABNORMAL LOW
MCV: 68.9 — ABNORMAL LOW
MCV: 71.4 — ABNORMAL LOW
Platelets: 123 — ABNORMAL LOW
Platelets: 57 — ABNORMAL LOW
Platelets: 57 — ABNORMAL LOW
Platelets: 58 — ABNORMAL LOW
Platelets: 74 — ABNORMAL LOW
Platelets: 92 — ABNORMAL LOW
RBC: 3.82 — ABNORMAL LOW
RBC: 3.82 — ABNORMAL LOW
RBC: 3.95
RBC: 4.12
RBC: 4.19
RBC: 4.51
RDW: 22.7 — ABNORMAL HIGH
RDW: 24.9 — ABNORMAL HIGH
RDW: 25.4 — ABNORMAL HIGH
RDW: 26.8 — ABNORMAL HIGH
RDW: 26.9 — ABNORMAL HIGH
RDW: 29 — ABNORMAL HIGH
WBC: 11.1 — ABNORMAL HIGH
WBC: 11.4 — ABNORMAL HIGH
WBC: 13.2 — ABNORMAL HIGH
WBC: 13.3 — ABNORMAL HIGH
WBC: 8.8
WBC: 9.8

## 2011-02-03 LAB — BLOOD GAS, ARTERIAL
Acid-Base Excess: 8.8 — ABNORMAL HIGH
Bicarbonate: 33.2 — ABNORMAL HIGH
FIO2: 0.21
O2 Saturation: 84.9
Patient temperature: 37
TCO2: 30.7
pCO2 arterial: 46 — ABNORMAL HIGH
pH, Arterial: 7.472 — ABNORMAL HIGH
pO2, Arterial: 50.1 — ABNORMAL LOW

## 2011-02-03 LAB — CARDIAC PANEL(CRET KIN+CKTOT+MB+TROPI)
CK, MB: 4
CK, MB: 4.4 — ABNORMAL HIGH
CK, MB: 5.6 — ABNORMAL HIGH
Relative Index: 1.6
Relative Index: 1.8
Relative Index: 1.9
Total CK: 239 — ABNORMAL HIGH
Total CK: 253 — ABNORMAL HIGH
Total CK: 290 — ABNORMAL HIGH
Troponin I: 0.01
Troponin I: 0.02
Troponin I: 0.02

## 2011-02-03 LAB — LIPID PANEL
Cholesterol: 156
HDL: 68
LDL Cholesterol: 79
Total CHOL/HDL Ratio: 2.3
Triglycerides: 44
VLDL: 9

## 2011-02-03 LAB — CROSSMATCH
ABO/RH(D): O POS
Antibody Screen: NEGATIVE

## 2011-02-03 LAB — PREPARE RBC (CROSSMATCH)

## 2011-02-03 LAB — HAPTOGLOBIN: Haptoglobin: 136

## 2011-02-03 LAB — HEPATIC FUNCTION PANEL
ALT: 13
AST: 25
Albumin: 3.7
Alkaline Phosphatase: 60
Bilirubin, Direct: 0.1
Indirect Bilirubin: 0.6
Total Bilirubin: 0.7
Total Protein: 7.8

## 2011-02-03 LAB — RAPID URINE DRUG SCREEN, HOSP PERFORMED
Amphetamines: NOT DETECTED
Barbiturates: NOT DETECTED
Benzodiazepines: NOT DETECTED
Cocaine: POSITIVE — AB
Opiates: NOT DETECTED
Tetrahydrocannabinol: NOT DETECTED

## 2011-02-03 LAB — FERRITIN: Ferritin: 1 — ABNORMAL LOW (ref 10–291)

## 2011-02-03 LAB — DIFFERENTIAL
Basophils Absolute: 0.1
Basophils Relative: 1
Eosinophils Absolute: 0.3
Eosinophils Relative: 2
Lymphocytes Relative: 6 — ABNORMAL LOW
Lymphs Abs: 0.8
Monocytes Absolute: 0.5
Monocytes Relative: 4
Neutro Abs: 11.6 — ABNORMAL HIGH
Neutrophils Relative %: 87 — ABNORMAL HIGH

## 2011-02-03 LAB — DIC (DISSEMINATED INTRAVASCULAR COAGULATION)PANEL
Smear Review: NONE SEEN
aPTT: 28

## 2011-02-03 LAB — TSH: TSH: 0.814

## 2011-02-03 LAB — FOLATE: Folate: >20

## 2011-02-03 LAB — HIV 1/2 CONFIRMATION
HIV-1 antibody: NEGATIVE
HIV-2 Ab: NEGATIVE

## 2011-02-03 LAB — IRON AND TIBC
Iron: 12 — ABNORMAL LOW
Saturation Ratios: 3 — ABNORMAL LOW
TIBC: 427
UIBC: 415

## 2011-02-03 LAB — LACTATE DEHYDROGENASE: LDH: 145

## 2011-02-03 LAB — OCCULT BLOOD X 1 CARD TO LAB, STOOL: Fecal Occult Bld: NEGATIVE

## 2011-02-03 LAB — RETICULOCYTES
RBC.: 2.89 — ABNORMAL LOW
Retic Count, Absolute: 28.9
Retic Ct Pct: 1

## 2011-02-03 LAB — VITAMIN B12: Vitamin B-12: 386 (ref 211–911)

## 2011-02-03 LAB — POCT CARDIAC MARKERS
CKMB, poc: 2.4
CKMB, poc: <1 — ABNORMAL LOW
Myoglobin, poc: 101
Myoglobin, poc: 52.4
Troponin i, poc: <0.05
Troponin i, poc: <0.05

## 2011-02-03 LAB — DIC (DISSEMINATED INTRAVASCULAR COAGULATION) PANEL (NOT AT ARMC)
D-Dimer, Quant: 0.41
Fibrinogen: 288
INR: 1.1
Platelets: 53 — ABNORMAL LOW
Prothrombin Time: 15

## 2011-02-03 LAB — D-DIMER, QUANTITATIVE: D-Dimer, Quant: 0.62 — ABNORMAL HIGH

## 2011-02-03 LAB — HEMOGLOBIN AND HEMATOCRIT, BLOOD
HCT: 30.4 — ABNORMAL LOW
Hemoglobin: 9.3 — ABNORMAL LOW

## 2011-02-03 LAB — ABO/RH: ABO/RH(D): O POS

## 2011-02-03 LAB — SAVE SMEAR

## 2011-02-03 LAB — HOMOCYSTEINE: Homocysteine: 3.4 — ABNORMAL LOW

## 2011-02-04 LAB — BASIC METABOLIC PANEL
BUN: 11
CO2: 31
Calcium: 11.1 — ABNORMAL HIGH
Chloride: 99
Creatinine, Ser: 0.73
GFR calc Af Amer: >60
GFR calc non Af Amer: >60
Glucose, Bld: 93
Potassium: 3.7
Sodium: 136

## 2011-02-04 LAB — CBC
HCT: 31.7 — ABNORMAL LOW
Hemoglobin: 9.7 — ABNORMAL LOW
MCHC: 30.5
MCV: 73.3 — ABNORMAL LOW
Platelets: 294
RBC: 4.33
RDW: 33.4 — ABNORMAL HIGH
WBC: 10

## 2011-02-04 LAB — POCT PREGNANCY, URINE: Preg Test, Ur: NEGATIVE

## 2014-06-28 ENCOUNTER — Other Ambulatory Visit: Payer: Self-pay

## 2014-06-28 DIAGNOSIS — Z1231 Encounter for screening mammogram for malignant neoplasm of breast: Secondary | ICD-10-CM

## 2014-07-06 ENCOUNTER — Ambulatory Visit: Payer: Self-pay

## 2014-07-06 ENCOUNTER — Ambulatory Visit
Admission: RE | Admit: 2014-07-06 | Discharge: 2014-07-06 | Disposition: A | Discharge status: Home / Self Care | Payer: Self-pay | Source: Ambulatory Visit

## 2014-07-06 DIAGNOSIS — Z1231 Encounter for screening mammogram for malignant neoplasm of breast: Secondary | ICD-10-CM

## 2014-07-10 ENCOUNTER — Other Ambulatory Visit: Payer: Self-pay | Admitting: *Deleted

## 2014-07-10 ENCOUNTER — Other Ambulatory Visit: Payer: Self-pay | Admitting: Nurse Practitioner

## 2014-07-10 DIAGNOSIS — R928 Other abnormal and inconclusive findings on diagnostic imaging of breast: Secondary | ICD-10-CM

## 2014-07-11 ENCOUNTER — Other Ambulatory Visit: Payer: Self-pay | Admitting: Registered Nurse

## 2014-07-11 DIAGNOSIS — R928 Other abnormal and inconclusive findings on diagnostic imaging of breast: Secondary | ICD-10-CM

## 2014-07-12 ENCOUNTER — Other Ambulatory Visit: Payer: BLUE CROSS/BLUE SHIELD

## 2014-07-12 ENCOUNTER — Ambulatory Visit
Admission: RE | Admit: 2014-07-12 | Discharge: 2014-07-12 | Disposition: A | Discharge status: Home / Self Care | Payer: BLUE CROSS/BLUE SHIELD | Source: Ambulatory Visit | Attending: Registered Nurse | Admitting: Registered Nurse

## 2014-07-12 ENCOUNTER — Other Ambulatory Visit: Payer: Self-pay | Admitting: Registered Nurse

## 2014-07-12 DIAGNOSIS — R928 Other abnormal and inconclusive findings on diagnostic imaging of breast: Secondary | ICD-10-CM

## 2014-07-12 DIAGNOSIS — N6320 Unspecified lump in the left breast, unspecified quadrant: Secondary | ICD-10-CM

## 2014-07-12 DIAGNOSIS — N6310 Unspecified lump in the right breast, unspecified quadrant: Secondary | ICD-10-CM

## 2014-07-13 ENCOUNTER — Other Ambulatory Visit: Payer: Self-pay | Admitting: Registered Nurse

## 2014-07-13 DIAGNOSIS — N6320 Unspecified lump in the left breast, unspecified quadrant: Secondary | ICD-10-CM

## 2014-07-20 ENCOUNTER — Ambulatory Visit
Admission: RE | Admit: 2014-07-20 | Discharge: 2014-07-20 | Disposition: A | Discharge status: Home / Self Care | Payer: BLUE CROSS/BLUE SHIELD | Source: Ambulatory Visit | Attending: Registered Nurse | Admitting: Registered Nurse

## 2014-07-20 ENCOUNTER — Other Ambulatory Visit: Payer: Self-pay | Admitting: Registered Nurse

## 2014-07-20 DIAGNOSIS — N6310 Unspecified lump in the right breast, unspecified quadrant: Secondary | ICD-10-CM

## 2014-07-20 DIAGNOSIS — N6320 Unspecified lump in the left breast, unspecified quadrant: Secondary | ICD-10-CM

## 2019-07-04 ENCOUNTER — Ambulatory Visit: Payer: BLUE CROSS/BLUE SHIELD | Attending: Internal Medicine

## 2019-07-04 DIAGNOSIS — Z23 Encounter for immunization: Secondary | ICD-10-CM | POA: Insufficient documentation

## 2019-07-04 NOTE — Progress Notes (Signed)
   Covid-19 Vaccination Clinic  Name:  Maripaz Mullan    MRN: [id number] DOB: January 22, 1961  07/04/2019  Ms. Barresi was observed post Covid-19 immunization for 15 minutes without incidence. She was provided with Vaccine Information Sheet and instruction to access the V-Safe system.   Ms. Johns was instructed to call 911 with any severe reactions post vaccine: Marland Kitchen Difficulty breathing  . Swelling of your face and throat  . A fast heartbeat  . A bad rash all over your body  . Dizziness and weakness    Immunizations Administered    Name Date Dose VIS Date Route   Pfizer COVID-19 Vaccine 07/04/2019  4:18 PM 0.3 mL 04/15/2019 Intramuscular   Manufacturer: [employer], [employer]   Lot: [id number]   NDC: [address]-1

## 2019-07-18 ENCOUNTER — Encounter (HOSPITAL_COMMUNITY): Payer: Self-pay | Admitting: Neurology

## 2019-07-18 ENCOUNTER — Inpatient Hospital Stay (HOSPITAL_COMMUNITY)
Admission: EM | Admit: 2019-07-18 | Discharge: 2019-08-31 | DRG: 003 | Disposition: A | Discharge status: Other Acute Inpatient Hospital | Payer: 59 | Attending: Internal Medicine | Admitting: Internal Medicine

## 2019-07-18 ENCOUNTER — Inpatient Hospital Stay (HOSPITAL_COMMUNITY): Payer: 59

## 2019-07-18 ENCOUNTER — Emergency Department (HOSPITAL_COMMUNITY): Payer: 59

## 2019-07-18 DIAGNOSIS — J690 Pneumonitis due to inhalation of food and vomit: Secondary | ICD-10-CM | POA: Diagnosis not present

## 2019-07-18 DIAGNOSIS — Z20822 Contact with and (suspected) exposure to covid-19: Secondary | ICD-10-CM | POA: Diagnosis present

## 2019-07-18 DIAGNOSIS — E878 Other disorders of electrolyte and fluid balance, not elsewhere classified: Secondary | ICD-10-CM | POA: Diagnosis not present

## 2019-07-18 DIAGNOSIS — R4701 Aphasia: Secondary | ICD-10-CM | POA: Diagnosis present

## 2019-07-18 DIAGNOSIS — I615 Nontraumatic intracerebral hemorrhage, intraventricular: Principal | ICD-10-CM

## 2019-07-18 DIAGNOSIS — Z79899 Other long term (current) drug therapy: Secondary | ICD-10-CM

## 2019-07-18 DIAGNOSIS — I160 Hypertensive urgency: Secondary | ICD-10-CM | POA: Diagnosis present

## 2019-07-18 DIAGNOSIS — E871 Hypo-osmolality and hyponatremia: Secondary | ICD-10-CM | POA: Diagnosis not present

## 2019-07-18 DIAGNOSIS — T783XXA Angioneurotic edema, initial encounter: Secondary | ICD-10-CM | POA: Diagnosis not present

## 2019-07-18 DIAGNOSIS — J449 Chronic obstructive pulmonary disease, unspecified: Secondary | ICD-10-CM | POA: Diagnosis present

## 2019-07-18 DIAGNOSIS — G9389 Other specified disorders of brain: Secondary | ICD-10-CM | POA: Diagnosis present

## 2019-07-18 DIAGNOSIS — E876 Hypokalemia: Secondary | ICD-10-CM | POA: Diagnosis not present

## 2019-07-18 DIAGNOSIS — R1110 Vomiting, unspecified: Secondary | ICD-10-CM

## 2019-07-18 DIAGNOSIS — J9600 Acute respiratory failure, unspecified whether with hypoxia or hypercapnia: Secondary | ICD-10-CM

## 2019-07-18 DIAGNOSIS — E669 Obesity, unspecified: Secondary | ICD-10-CM | POA: Diagnosis present

## 2019-07-18 DIAGNOSIS — G8194 Hemiplegia, unspecified affecting left nondominant side: Secondary | ICD-10-CM | POA: Diagnosis present

## 2019-07-18 DIAGNOSIS — T461X5A Adverse effect of calcium-channel blockers, initial encounter: Secondary | ICD-10-CM | POA: Diagnosis not present

## 2019-07-18 DIAGNOSIS — Y92230 Patient room in hospital as the place of occurrence of the external cause: Secondary | ICD-10-CM | POA: Diagnosis not present

## 2019-07-18 DIAGNOSIS — Z978 Presence of other specified devices: Secondary | ICD-10-CM

## 2019-07-18 DIAGNOSIS — I639 Cerebral infarction, unspecified: Secondary | ICD-10-CM | POA: Diagnosis present

## 2019-07-18 DIAGNOSIS — Z930 Tracheostomy status: Secondary | ICD-10-CM | POA: Diagnosis not present

## 2019-07-18 DIAGNOSIS — D6489 Other specified anemias: Secondary | ICD-10-CM | POA: Diagnosis not present

## 2019-07-18 DIAGNOSIS — R001 Bradycardia, unspecified: Secondary | ICD-10-CM | POA: Diagnosis present

## 2019-07-18 DIAGNOSIS — J9601 Acute respiratory failure with hypoxia: Secondary | ICD-10-CM | POA: Diagnosis not present

## 2019-07-18 DIAGNOSIS — R739 Hyperglycemia, unspecified: Secondary | ICD-10-CM | POA: Diagnosis present

## 2019-07-18 DIAGNOSIS — R220 Localized swelling, mass and lump, head: Secondary | ICD-10-CM | POA: Diagnosis not present

## 2019-07-18 DIAGNOSIS — I610 Nontraumatic intracerebral hemorrhage in hemisphere, subcortical: Secondary | ICD-10-CM

## 2019-07-18 DIAGNOSIS — Z23 Encounter for immunization: Secondary | ICD-10-CM

## 2019-07-18 DIAGNOSIS — G936 Cerebral edema: Secondary | ICD-10-CM | POA: Diagnosis present

## 2019-07-18 DIAGNOSIS — R1310 Dysphagia, unspecified: Secondary | ICD-10-CM | POA: Diagnosis present

## 2019-07-18 DIAGNOSIS — Z880 Allergy status to penicillin: Secondary | ICD-10-CM

## 2019-07-18 DIAGNOSIS — Z789 Other specified health status: Secondary | ICD-10-CM | POA: Diagnosis not present

## 2019-07-18 DIAGNOSIS — Z6832 Body mass index (BMI) 32.0-32.9, adult: Secondary | ICD-10-CM

## 2019-07-18 DIAGNOSIS — Z8673 Personal history of transient ischemic attack (TIA), and cerebral infarction without residual deficits: Secondary | ICD-10-CM

## 2019-07-18 DIAGNOSIS — R4182 Altered mental status, unspecified: Secondary | ICD-10-CM | POA: Diagnosis not present

## 2019-07-18 DIAGNOSIS — I618 Other nontraumatic intracerebral hemorrhage: Secondary | ICD-10-CM | POA: Diagnosis present

## 2019-07-18 DIAGNOSIS — R1312 Dysphagia, oropharyngeal phase: Secondary | ICD-10-CM | POA: Diagnosis not present

## 2019-07-18 DIAGNOSIS — Q382 Macroglossia: Secondary | ICD-10-CM

## 2019-07-18 DIAGNOSIS — Z515 Encounter for palliative care: Secondary | ICD-10-CM

## 2019-07-18 DIAGNOSIS — G4733 Obstructive sleep apnea (adult) (pediatric): Secondary | ICD-10-CM | POA: Diagnosis present

## 2019-07-18 DIAGNOSIS — E870 Hyperosmolality and hypernatremia: Secondary | ICD-10-CM | POA: Diagnosis not present

## 2019-07-18 DIAGNOSIS — Z7189 Other specified counseling: Secondary | ICD-10-CM

## 2019-07-18 DIAGNOSIS — G9341 Metabolic encephalopathy: Secondary | ICD-10-CM | POA: Diagnosis not present

## 2019-07-18 DIAGNOSIS — J9690 Respiratory failure, unspecified, unspecified whether with hypoxia or hypercapnia: Secondary | ICD-10-CM

## 2019-07-18 DIAGNOSIS — T783XXD Angioneurotic edema, subsequent encounter: Secondary | ICD-10-CM | POA: Diagnosis not present

## 2019-07-18 DIAGNOSIS — Z888 Allergy status to other drugs, medicaments and biological substances status: Secondary | ICD-10-CM

## 2019-07-18 DIAGNOSIS — R29810 Facial weakness: Secondary | ICD-10-CM | POA: Diagnosis present

## 2019-07-18 DIAGNOSIS — J189 Pneumonia, unspecified organism: Secondary | ICD-10-CM

## 2019-07-18 DIAGNOSIS — R509 Fever, unspecified: Secondary | ICD-10-CM | POA: Diagnosis present

## 2019-07-18 DIAGNOSIS — Z4659 Encounter for fitting and adjustment of other gastrointestinal appliance and device: Secondary | ICD-10-CM

## 2019-07-18 DIAGNOSIS — G911 Obstructive hydrocephalus: Secondary | ICD-10-CM | POA: Diagnosis present

## 2019-07-18 HISTORY — DX: Nontraumatic intracerebral hemorrhage, intraventricular: I61.5

## 2019-07-18 LAB — I-STAT CHEM 8, ED
BUN: 13 mg/dL (ref 6–20)
Calcium, Ion: 1.24 mmol/L (ref 1.15–1.40)
Chloride: 102 mmol/L (ref 98–111)
Creatinine, Ser: 0.6 mg/dL (ref 0.44–1.00)
Glucose, Bld: 153 mg/dL — ABNORMAL HIGH (ref 70–99)
HCT: 41 % (ref 36.0–46.0)
Hemoglobin: 13.9 g/dL (ref 12.0–15.0)
Potassium: 3.3 mmol/L — ABNORMAL LOW (ref 3.5–5.1)
Sodium: 137 mmol/L (ref 135–145)
TCO2: 26 mmol/L (ref 22–32)

## 2019-07-18 LAB — COMPREHENSIVE METABOLIC PANEL
ALT: 20 U/L (ref 0–44)
AST: 21 U/L (ref 15–41)
Albumin: 4.1 g/dL (ref 3.5–5.0)
Alkaline Phosphatase: 62 U/L (ref 38–126)
Anion gap: 13 (ref 5–15)
BUN: 11 mg/dL (ref 6–20)
CO2: 24 mmol/L (ref 22–32)
Calcium: 10.4 mg/dL — ABNORMAL HIGH (ref 8.9–10.3)
Chloride: 99 mmol/L (ref 98–111)
Creatinine, Ser: 0.76 mg/dL (ref 0.44–1.00)
GFR calc Af Amer: >60 mL/min (ref >60)
GFR calc non Af Amer: >60 mL/min (ref >60)
Glucose, Bld: 157 mg/dL — ABNORMAL HIGH (ref 70–99)
Potassium: 3.6 mmol/L (ref 3.5–5.1)
Sodium: 136 mmol/L (ref 135–145)
Total Bilirubin: 0.9 mg/dL (ref 0.3–1.2)
Total Protein: 8.3 g/dL — ABNORMAL HIGH (ref 6.5–8.1)

## 2019-07-18 LAB — POCT I-STAT 7, (LYTES, BLD GAS, ICA,H+H)
Acid-Base Excess: 2 mmol/L (ref 0.0–2.0)
Bicarbonate: 29.8 mmol/L — ABNORMAL HIGH (ref 20.0–28.0)
Calcium, Ion: 1.44 mmol/L — ABNORMAL HIGH (ref 1.15–1.40)
HCT: 40 % (ref 36.0–46.0)
Hemoglobin: 13.6 g/dL (ref 12.0–15.0)
O2 Saturation: 98 %
Patient temperature: 95.7
Potassium: 3.5 mmol/L (ref 3.5–5.1)
Sodium: 138 mmol/L (ref 135–145)
TCO2: 32 mmol/L (ref 22–32)
pCO2 arterial: 57.1 mmHg — ABNORMAL HIGH (ref 32.0–48.0)
pH, Arterial: 7.317 — ABNORMAL LOW (ref 7.350–7.450)
pO2, Arterial: 107 mmHg (ref 83.0–108.0)

## 2019-07-18 LAB — CBC
HCT: 40.4 % (ref 36.0–46.0)
Hemoglobin: 13 g/dL (ref 12.0–15.0)
MCH: 28.1 pg (ref 26.0–34.0)
MCHC: 32.2 g/dL (ref 30.0–36.0)
MCV: 87.4 fL (ref 80.0–100.0)
Platelets: 227 10*3/uL (ref 150–400)
RBC: 4.62 MIL/uL (ref 3.87–5.11)
RDW: 13.2 % (ref 11.5–15.5)
WBC: 8.8 10*3/uL (ref 4.0–10.5)
nRBC: 0 % (ref 0.0–0.2)

## 2019-07-18 LAB — HIV ANTIBODY (ROUTINE TESTING W REFLEX): HIV Screen 4th Generation wRfx: NONREACTIVE

## 2019-07-18 LAB — DIFFERENTIAL
Abs Immature Granulocytes: 0.02 10*3/uL (ref 0.00–0.07)
Basophils Absolute: 0.1 10*3/uL (ref 0.0–0.1)
Basophils Relative: 1 %
Eosinophils Absolute: 0.3 10*3/uL (ref 0.0–0.5)
Eosinophils Relative: 4 %
Immature Granulocytes: 0 %
Lymphocytes Relative: 23 %
Lymphs Abs: 2 10*3/uL (ref 0.7–4.0)
Monocytes Absolute: 0.7 10*3/uL (ref 0.1–1.0)
Monocytes Relative: 8 %
Neutro Abs: 5.7 10*3/uL (ref 1.7–7.7)
Neutrophils Relative %: 64 %

## 2019-07-18 LAB — RESPIRATORY PANEL BY RT PCR (FLU A&B, COVID)
Influenza A by PCR: NEGATIVE
Influenza B by PCR: NEGATIVE
SARS Coronavirus 2 by RT PCR: NEGATIVE

## 2019-07-18 LAB — I-STAT BETA HCG BLOOD, ED (MC, WL, AP ONLY): I-stat hCG, quantitative: 5.8 m[IU]/mL — ABNORMAL HIGH (ref <5)

## 2019-07-18 LAB — APTT: aPTT: 23 seconds — ABNORMAL LOW (ref 24–36)

## 2019-07-18 LAB — PROTIME-INR
INR: 1 (ref 0.8–1.2)
Prothrombin Time: 12.6 seconds (ref 11.4–15.2)

## 2019-07-18 MED ORDER — ACETAMINOPHEN 325 MG PO TABS
650.0000 mg | ORAL_TABLET | ORAL | Status: DC | PRN
  Administered 2019-08-25: 650 mg via ORAL
  Filled 2019-07-18: qty 2

## 2019-07-18 MED ORDER — CLEVIDIPINE BUTYRATE 0.5 MG/ML IV EMUL
0.0000 mg/h | INTRAVENOUS | Status: DC
  Administered 2019-07-18: 1 mg/h via INTRAVENOUS
  Administered 2019-07-19: 16 mg/h via INTRAVENOUS
  Administered 2019-07-19: 12 mg/h via INTRAVENOUS
  Administered 2019-07-19: 18 mg/h via INTRAVENOUS
  Administered 2019-07-19: 1 mg/h via INTRAVENOUS
  Administered 2019-07-19: 20 mg/h via INTRAVENOUS
  Administered 2019-07-20: 6 mg/h via INTRAVENOUS
  Administered 2019-07-20: 8 mg/h via INTRAVENOUS
  Administered 2019-07-20: 16 mg/h via INTRAVENOUS
  Administered 2019-07-20: 8 mg/h via INTRAVENOUS
  Administered 2019-07-21: 12 mg/h via INTRAVENOUS
  Administered 2019-07-21: 8 mg/h via INTRAVENOUS
  Administered 2019-07-21: 22:00:00 13 mg/h via INTRAVENOUS
  Administered 2019-07-21: 21 mg/h via INTRAVENOUS
  Administered 2019-07-21: 10 mg/h via INTRAVENOUS
  Administered 2019-07-22: 1 mg/h via INTRAVENOUS
  Administered 2019-07-22: 16 mg/h via INTRAVENOUS
  Administered 2019-07-23: 3 mg/h via INTRAVENOUS
  Administered 2019-07-23: 18 mg/h via INTRAVENOUS
  Administered 2019-07-23: 20 mg/h via INTRAVENOUS
  Administered 2019-07-23: 8 mg/h via INTRAVENOUS
  Administered 2019-07-23: 18 mg/h via INTRAVENOUS
  Administered 2019-07-24: 21 mg/h via INTRAVENOUS
  Administered 2019-07-24: 19 mg/h via INTRAVENOUS
  Administered 2019-07-24: 21 mg/h via INTRAVENOUS
  Administered 2019-07-24: 18 mg/h via INTRAVENOUS
  Administered 2019-07-24: 09:00:00 19 mg/h via INTRAVENOUS
  Administered 2019-07-24: 21 mg/h via INTRAVENOUS
  Administered 2019-07-24: 18 mg/h via INTRAVENOUS
  Administered 2019-07-24: 19 mg/h via INTRAVENOUS
  Administered 2019-07-24: 14:00:00 20 mg/h via INTRAVENOUS
  Administered 2019-07-25: 13 mg/h via INTRAVENOUS
  Administered 2019-07-25: 2 mg/h via INTRAVENOUS
  Administered 2019-07-25: 9 mg/h via INTRAVENOUS
  Administered 2019-07-25: 15 mg/h via INTRAVENOUS
  Administered 2019-07-25: 19 mg/h via INTRAVENOUS
  Administered 2019-07-26: 6 mg/h via INTRAVENOUS
  Administered 2019-07-27: 3 mg/h via INTRAVENOUS
  Administered 2019-07-27: 10 mg/h via INTRAVENOUS
  Administered 2019-07-30 (×2): 4 mg/h via INTRAVENOUS
  Administered 2019-08-01: 09:00:00 5 mg/h via INTRAVENOUS
  Administered 2019-08-02: 6 mg/h via INTRAVENOUS
  Filled 2019-07-18: qty 50
  Filled 2019-07-18: qty 100
  Filled 2019-07-18 (×14): qty 50
  Filled 2019-07-18: qty 100
  Filled 2019-07-18 (×7): qty 50
  Filled 2019-07-18: qty 100
  Filled 2019-07-18 (×3): qty 50
  Filled 2019-07-18: qty 100
  Filled 2019-07-18 (×13): qty 50

## 2019-07-18 MED ORDER — ONDANSETRON HCL 4 MG/2ML IJ SOLN
INTRAMUSCULAR | Status: AC
  Filled 2019-07-18: qty 2

## 2019-07-18 MED ORDER — LABETALOL HCL 5 MG/ML IV SOLN
10.0000 mg | Freq: Once | INTRAVENOUS | Status: AC
  Administered 2019-07-18: 10 mg via INTRAVENOUS

## 2019-07-18 MED ORDER — CLEVIDIPINE BUTYRATE 0.5 MG/ML IV EMUL
INTRAVENOUS | Status: AC
  Filled 2019-07-18: qty 50

## 2019-07-18 MED ORDER — ACETAMINOPHEN 160 MG/5ML PO SOLN
650.0000 mg | ORAL | Status: DC | PRN
  Administered 2019-07-19 – 2019-08-31 (×36): 650 mg
  Filled 2019-07-18 (×37): qty 20.3

## 2019-07-18 MED ORDER — CHLORHEXIDINE GLUCONATE CLOTH 2 % EX PADS
6.0000 | MEDICATED_PAD | Freq: Every day | CUTANEOUS | Status: DC
  Administered 2019-07-20 – 2019-07-30 (×8): 6 via TOPICAL

## 2019-07-18 MED ORDER — ONDANSETRON HCL 4 MG/2ML IJ SOLN
4.0000 mg | Freq: Once | INTRAMUSCULAR | Status: AC
  Administered 2019-07-18: 4 mg via INTRAVENOUS

## 2019-07-18 MED ORDER — PROPOFOL 1000 MG/100ML IV EMUL
INTRAVENOUS | Status: AC
  Administered 2019-07-18: 40 ug/kg/min
  Filled 2019-07-18: qty 100

## 2019-07-18 MED ORDER — ACETAMINOPHEN 650 MG RE SUPP: 650.0000 mg | RECTAL | Status: DC | PRN

## 2019-07-18 MED ORDER — SENNOSIDES-DOCUSATE SODIUM 8.6-50 MG PO TABS: 1.0000 | ORAL_TABLET | Freq: Two times a day (BID) | ORAL | Status: DC

## 2019-07-18 MED ORDER — PANTOPRAZOLE SODIUM 40 MG IV SOLR: 40.0000 mg | Freq: Every day | INTRAVENOUS | Status: DC

## 2019-07-18 MED ORDER — STROKE: EARLY STAGES OF RECOVERY BOOK
Freq: Once | Status: DC
  Filled 2019-07-18: qty 1

## 2019-07-18 MED ORDER — FENTANYL CITRATE (PF) 100 MCG/2ML IJ SOLN
INTRAMUSCULAR | Status: AC
  Filled 2019-07-18: qty 2

## 2019-07-18 MED ORDER — SODIUM CHLORIDE 0.9% FLUSH: 3.0000 mL | Freq: Once | INTRAVENOUS | Status: DC

## 2019-07-18 MED ORDER — FENTANYL CITRATE (PF) 100 MCG/2ML IJ SOLN
INTRAMUSCULAR | Status: AC
  Administered 2019-07-18: 50 ug
  Filled 2019-07-18: qty 2

## 2019-07-18 NOTE — ED Notes (Signed)
Pt's daughter at bedside, please call her with questions/information/changes Mckala Pantaleon McClinton [phone]

## 2019-07-18 NOTE — ED Notes (Signed)
Attempted x 2 to call daughter, no answer, LM

## 2019-07-18 NOTE — ED Provider Notes (Signed)
[facility] EMERGENCY DEPARTMENT Provider Note   CSN: [id number] Arrival date & time: 07/18/19  2031     History Chief Complaint  Patient presents with  . Code Stroke    Haley Hernandez is a 59 y.o. female.  HPI Patient is a 59 year old female with a PMH of OSA, COPD presenting to the ED today due to concern for stroke.  Per EMS, patient was on the phone with her family at 75 when she stopped responding appropriately.  Family arrived at her house and found her on the ground.  They subsequently called EMS who transported her to the ED for further management.  Per EMS, patient had left hemibody weakness with rightward gaze deviation.  They noted her SBP to be in the 250s.    No past medical history on file.  Patient Active Problem List   Diagnosis Date Noted  . ICH (intracerebral hemorrhage) ([place]) 07/18/2019  . OBSTRUCTIVE SLEEP APNEA 06/27/2008  . COPD 06/27/2008  . COUGH DUE TO ACE INHIBITORS 03/03/2008  . HYPOXEMIA 03/03/2008       OB History   No obstetric history on file.     No family history on file.  Social History   Tobacco Use  . Smoking status: Not on file  Substance Use Topics  . Alcohol use: Not on file  . Drug use: Not on file    Home Medications Prior to Admission medications   Not on File    Allergies    Penicillins  Review of Systems   Review of Systems  Unable to perform ROS: Acuity of condition    Physical Exam Updated Vital Signs BP 114/75   Pulse 62   Temp (!) 95.7 F (35.4 C) (Tympanic)   Resp 16   SpO2 100%   Physical Exam Vitals and nursing note reviewed.  Constitutional:      General: She is in acute distress.     Appearance: She is well-developed. She is obese. She is ill-appearing.     Comments: Patient somnolent on arrival with vomitus on chart.  HENT:     Head: Normocephalic and atraumatic.     Right Ear: External ear normal.     Left Ear: External ear normal.     Nose: Nose normal. No  congestion or rhinorrhea.     Mouth/Throat:     Mouth: Mucous membranes are moist.     Pharynx: Oropharynx is clear.  Eyes:     Pupils: Pupils are equal, round, and reactive to light.     Comments: Rightward gaze deviation.  Cardiovascular:     Rate and Rhythm: Normal rate and regular rhythm.     Pulses: Normal pulses.     Heart sounds: Normal heart sounds.  Pulmonary:     Effort: Pulmonary effort is normal. No respiratory distress.     Breath sounds: Normal breath sounds. No stridor. No wheezing, rhonchi or rales.  Abdominal:     General: There is no distension.     Palpations: Abdomen is soft.     Tenderness: There is no abdominal tenderness. There is no guarding or rebound.  Musculoskeletal:        General: Normal range of motion.     Cervical back: Normal range of motion and neck supple.     Right lower leg: No edema.     Left lower leg: No edema.  Skin:    General: Skin is warm and dry.     Capillary Refill: Capillary refill  takes less than 2 seconds.  Neurological:     Comments: Patient somnolent on arrival.  She arouses and opens her eyes to verbal stimulation.  She follows simple commands in her right upper extremity.  She has rightward gaze deviation.  She has 5/5 strength in right upper and lower extremities.  0/5 strength in left upper and lower extremities.  She makes incomprehensible sounds.     ED Results / Procedures / Treatments   Labs (all labs ordered are listed, but only abnormal results are displayed) Labs Reviewed  APTT - Abnormal; Notable for the following components:      Result Value   aPTT 23 (*)    All other components within normal limits  COMPREHENSIVE METABOLIC PANEL - Abnormal; Notable for the following components:   Glucose, Bld 157 (*)    Calcium 10.4 (*)    Total Protein 8.3 (*)    All other components within normal limits  I-STAT CHEM 8, ED - Abnormal; Notable for the following components:   Potassium 3.3 (*)    Glucose, Bld 153 (*)     All other components within normal limits  I-STAT BETA HCG BLOOD, ED ([facility], [facility], [facility] ONLY) - Abnormal; Notable for the following components:   I-stat hCG, quantitative 5.8 (*)    All other components within normal limits  POCT I-STAT 7, (LYTES, BLD GAS, ICA,H+H) - Abnormal; Notable for the following components:   pH, Arterial 7.317 (*)    pCO2 arterial 57.1 (*)    Bicarbonate 29.8 (*)    Calcium, Ion 1.44 (*)    All other components within normal limits  RESPIRATORY PANEL BY RT PCR (FLU A&B, COVID)  MRSA PCR SCREENING  PROTIME-INR  CBC  DIFFERENTIAL  HIV ANTIBODY (ROUTINE TESTING W REFLEX)  TRIGLYCERIDES  BLOOD GAS, ARTERIAL  CBC  RENAL FUNCTION PANEL  MAGNESIUM  RAPID URINE DRUG SCREEN, HOSP PERFORMED  CBG MONITORING, ED    EKG EKG Interpretation  Date/Time:  Monday July 18 2019 21:09:32 EDT Ventricular Rate:  65 PR Interval:    QRS Duration: 105 QT Interval:  430 QTC Calculation: 448 R Axis:   -36 Text Interpretation: Sinus rhythm Prolonged PR interval Abnormal R-wave progression, late transition LVH with secondary repolarization abnormality No significant change since last tracing Confirmed by Wandra Arthurs [phone]) on 07/18/2019 9:46:52 PM   Radiology DG Chest Portable 1 View  Result Date: 07/18/2019 CLINICAL DATA:  Endotracheal tube placement. EXAM: PORTABLE CHEST 1 VIEW COMPARISON:  February 03, 2008 FINDINGS: An endotracheal tube is seen with its distal tip approximately 4.0 cm from the carina. A nasogastric tube is noted with its distal tip overlying the body of the stomach. There is no evidence of an acute infiltrate, pleural effusion or pneumothorax. The heart size and mediastinal contours are within normal limits. The visualized skeletal structures are unremarkable. IMPRESSION: Endotracheal tube and nasogastric tube positioning, as described above, without evidence of acute or active cardiopulmonary disease. Electronically Signed   By: Virgina Norfolk M.D.   On:  07/18/2019 22:09   CT HEAD CODE STROKE WO CONTRAST  Addendum Date: 07/18/2019   ADDENDUM REPORT: 07/18/2019 21:30 ADDENDUM: These results were called by telephone at the time of interpretation on 07/18/2019 at 9 pm to provider Dr. Lorraine Lax, who verbally acknowledged these results. Electronically Signed   By: Kellie Simmering DO   On: 07/18/2019 21:30   Result Date: 07/18/2019 CLINICAL DATA:  Code stroke. Neuro deficit, acute, stroke suspected. Additional history provided: Neuro deficit,  acute, stroke suspected, left-sided weakness, dizziness. EXAM: CT HEAD WITHOUT CONTRAST TECHNIQUE: Contiguous axial images were obtained from the base of the skull through the vertex without intravenous contrast. COMPARISON:  No pertinent prior studies available for comparison. FINDINGS: Brain: There is an acute parenchymal hemorrhage with moderate surrounding edema centered within the region of the posterior right basal ganglia and right thalamus. The dominant portion of the parenchymal hemorrhage measures 3.2 x 2.9 x 4.2 cm. Moderate volume intraventricular extension of hemorrhage into the lateral, third and fourth ventricles. Associated mass effect with partial effacement of the right lateral and third ventricles. 4 mm leftward midline shift on the current examination. There is mild prominence of the lateral ventricles suspicious for early entrapment/hydrocephalus, particularly the right temporal horn. Vascular: No hyperdense vessel.  Atherosclerotic calcifications. Skull: Normal. Negative for fracture or focal lesion. Sinuses/Orbits: Visualized orbits demonstrate no acute abnormality. Large left maxillary sinus mucous retention cyst. Bilateral ethmoid and maxillary sinus mucosal thickening. No significant mastoid effusion. IMPRESSION: 3.2 x 2.9 x 4.2 cm acute parenchymal hemorrhage with surrounding edema centered within the posterior right basal ganglia and right thalamus. Moderate volume intraventricular extension. Mass effect  with partial effacement of the right lateral and third ventricles, and 4 mm leftward midline shift. Mild prominence of the lateral ventricles suspicious for early entrapment/hydrocephalus. Electronically Signed: By: Kellie Simmering DO On: 07/18/2019 20:50    Procedures Procedure Name: Intubation Date/Time: 07/19/2019 12:56 AM Performed by: Nadeen Landau, MD Oxygen Delivery Method: Non-rebreather mask Preoxygenation: Pre-oxygenation with 100% oxygen Induction Type: Rapid sequence Ventilation: Mask ventilation without difficulty Laryngoscope Size: Mac and 4 Grade View: Grade II Tube size: 7.5 mm Number of attempts: 1 Placement Confirmation: ETT inserted through vocal cords under direct vision Secured at: 22 cm Tube secured with: ETT holder Dental Injury: Teeth and Oropharynx as per pre-operative assessment       (including critical care time)  Medications Ordered in ED Medications  sodium chloride flush (NS) 0.9 % injection 3 mL (has no administration in time range)   stroke: mapping our early stages of recovery book (has no administration in time range)  acetaminophen (TYLENOL) tablet 650 mg (has no administration in time range)    Or  acetaminophen (TYLENOL) 160 MG/5ML solution 650 mg (has no administration in time range)    Or  acetaminophen (TYLENOL) suppository 650 mg (has no administration in time range)  senna-docusate (Senokot-S) tablet 1 tablet (has no administration in time range)  pantoprazole (PROTONIX) injection 40 mg (has no administration in time range)  clevidipine (CLEVIPREX) infusion 0.5 mg/mL (1 mg/hr Intravenous New Bag/Given 07/18/19 2144)  ondansetron (ZOFRAN) 4 MG/2ML injection (has no administration in time range)  fentaNYL (SUBLIMAZE) 100 MCG/2ML injection (has no administration in time range)  labetalol (NORMODYNE) injection 10 mg (10 mg Intravenous Given 07/18/19 2035)  ondansetron (ZOFRAN) injection 4 mg (4 mg Intravenous Given 07/18/19 2037)  propofol  (DIPRIVAN) 1000 MG/100ML infusion (40 mcg/kg/min  New Bag/Given 07/18/19 2147)  fentaNYL (SUBLIMAZE) 100 MCG/2ML injection (50 mcg  Given 07/18/19 2105)    ED Course  I have reviewed the triage vital signs and the nursing notes.  Pertinent labs & imaging results that were available during my care of the patient were reviewed by me and considered in my medical decision making (see chart for details).    MDM Rules/Calculators/A&P                     Patient is a 59 year old female with a PMH  of OSA, COPD presenting to the ED today due to concern for stroke.  On arrival, patient somnolent but arouses to verbal stimuli.  Physical exam concerning for stroke.  Patient initially maintaining her airway and deemed stable to go to CT scanner.  Large IVH noted in CT scanner.  Patient significantly hypertensive with initial BP 248/123.  She was started on Cleviprex and intubated for airway protection as documented above.  Patient placed on propofol for sedation.  SBP goal less than 160.  CT scan revealed acute parenchymal hemorrhage with surrounding edema centered within the posterior right basal ganglia and right thalamus.  Moderate volume intraventricular extension.  4 mm midline shift.  Signs of early hydrocephalus.  Neurosurgery team consulted for EVD.  Patient admitted to neurology team.  For details on hospital course following admission, please refer to oncoming team's note.  Patient stable at time of admission.  Patient assessed and evaluated with Dr. Darl Householder.  Nadeen Landau, MD   Final Clinical Impression(s) / ED Diagnoses Final diagnoses:  None    Rx / DC Orders ED Discharge Orders    None       Nadeen Landau, MD 07/19/19 0059    Drenda Freeze, MD 07/20/19 1031

## 2019-07-18 NOTE — Procedures (Signed)
Consent was obtained by daughter to place EVD. Patient was prepped and draped in sterile fashion. Insertion site was marked mid puppilary line on the left just behind the hairline. 3 cc lidocaine was used. Small stab incision was made. Hand drill was then used to make a craniotomy. Dura was punctured. Ventricular catheter was then inserted to 59mm where csf flow was visualized. A separate incision was made laterally and catheter was tunneled underneath the skin. Catheter was sutured in place. CSF flowed freely. Initial incision was then sutured closed. Catheter was hooked up to external drainage system. Sterile dressing was applied. Patient's vital signs were stable and tolerated procedure well. Drain to set to 10cm H20

## 2019-07-18 NOTE — H&P (Signed)
Chief Complaint: Left Side weakness  History obtained from: Daughter and Chart    HPI:                                                                                                                                       Haley Hernandez is a 59 y.o. female with past medical history of intracerebral hemorrhage 7 years ago, hypertension presents to the emergency department after family found her unresponsive.  Last known normal 7:45 PM, family found her unresponsive.  EMS was called and patient noted to be plegic on the left side and gaze deviation to the right.  Blood pressure was 250 systolic.  When patient arrived to [facility] ER, she was immediately taken for stat CT head which demonstrated a large right thalamic hemorrhage with intraventricular extension and early hydrocephalus.  She was emergently intubated for airway protection.  Labetalol and Cleviprex was initiated for blood pressure control, initial delay as patient needed to be intubated.  Neurosurgery was consulted for EVD placement.  Date last known well: 3.15.21 Time last known well: 7.45 pm tPA Given: no, hemorrhage    Intracerebral Hemorrhage (ICH) Score Glascow Coma Score 5-12 +1 Age >/= 80 yes no 0 ICH volume >/= 45ml no 0 IVH yes +1 Infratentorial origin no 0 Total:  2   Past medical history -Hypertension, sleep apnea, hemorrhage, obesity  Social history: Non-smoker, no history of alcohol abuse Family history: No strokes at young age  Allergies:  Allergies  Allergen Reactions   Penicillins     Medications:                                                                                                                        I reviewed home medications   ROS:  Unable to obtain due to patient's mental status   Examination:                                                                                                       General: Appears well-developed, obese Psych: Affect appropriate to situation Eyes: No scleral injection HENT: No OP obstrucion Head: Normocephalic Cardiovascular: Normal rate and regular rhythm.  Respiratory: Effort normal and breath sounds normal to anterior ascultation GI: Soft.  No distension. There is no tenderness.  Skin: WDI    Neurological Examination Mental Status: Somnolent but arousable, follows simple commands such as squeezing with her right hand and showing a thumbs up.  Nonverbal. Cranial Nerves: II: Visual fields grossly normal,  III,IV, VI: Forced gaze deviation to the right VII: left facial droop XII: midline tongue extension Motor: Right : Upper extremity   5/5    Left:     Upper extremity   0/5  Lower extremity   5/5     Lower extremity   1/5 Tone and bulk:normal tone throughout; no atrophy noted Sensory: No appreciable grimace to noxious stimulus on the left side Plantars: Right: downgoing   Left: downgoing Cerebellar: normal finger-to-nose, normal rapid alternating movements  Gait: Unable to assess     Lab Results: Basic Metabolic Panel: Recent Labs  Lab 07/18/19 09/13/2033 07/18/19 2047 07/18/19 09-14-2111  NA 136 137 138  K 3.6 3.3* 3.5  CL 99 102  --   CO2 24  --   --   GLUCOSE 157* 153*  --   BUN 11 13  --   CREATININE 0.76 0.60  --   CALCIUM 10.4*  --   --     CBC: Recent Labs  Lab 07/18/19 Sep 13, 2033 07/18/19 Sep 13, 2045 07/18/19 2111-09-14  WBC 8.8  --   --   NEUTROABS 5.7  --   --   HGB 13.0 13.9 13.6  HCT 40.4 41.0 40.0  MCV 87.4  --   --   PLT 227  --   --     Coagulation Studies: Recent Labs    07/18/19 2033/09/13  LABPROT 12.6  INR 1.0    Imaging: CT HEAD CODE STROKE WO CONTRAST  Addendum Date: 07/18/2019   ADDENDUM REPORT: 07/18/2019 21:30 ADDENDUM: These results were called by telephone at the time of interpretation on 07/18/2019 at 9 pm to provider Dr. Laurence Slate, who verbally acknowledged these  results. Electronically Signed   By: Jackey Loge DO   On: 07/18/2019 21:30   Result Date: 07/18/2019 CLINICAL DATA:  Code stroke. Neuro deficit, acute, stroke suspected. Additional history provided: Neuro deficit, acute, stroke suspected, left-sided weakness, dizziness. EXAM: CT HEAD WITHOUT CONTRAST TECHNIQUE: Contiguous axial images were obtained from the base of the skull through the vertex without intravenous contrast. COMPARISON:  No pertinent prior studies available for comparison. FINDINGS: Brain: There is an acute parenchymal hemorrhage with moderate surrounding edema centered within the region of the posterior right basal ganglia and right thalamus. The dominant portion of the parenchymal hemorrhage measures 3.2 x 2.9 x 4.2 cm. Moderate volume intraventricular  extension of hemorrhage into the lateral, third and fourth ventricles. Associated mass effect with partial effacement of the right lateral and third ventricles. 4 mm leftward midline shift on the current examination. There is mild prominence of the lateral ventricles suspicious for early entrapment/hydrocephalus, particularly the right temporal horn. Vascular: No hyperdense vessel.  Atherosclerotic calcifications. Skull: Normal. Negative for fracture or focal lesion. Sinuses/Orbits: Visualized orbits demonstrate no acute abnormality. Large left maxillary sinus mucous retention cyst. Bilateral ethmoid and maxillary sinus mucosal thickening. No significant mastoid effusion. IMPRESSION: 3.2 x 2.9 x 4.2 cm acute parenchymal hemorrhage with surrounding edema centered within the posterior right basal ganglia and right thalamus. Moderate volume intraventricular extension. Mass effect with partial effacement of the right lateral and third ventricles, and 4 mm leftward midline shift. Mild prominence of the lateral ventricles suspicious for early entrapment/hydrocephalus. Electronically Signed: By: Kellie Simmering DO On: 07/18/2019 20:50     ASSESSMENT AND  PLAN   59 year old female with past medical history of hypertension, sleep apnea, hemorrhage presents to the emergency department with left hemiplegia and right gaze deviation secondary to right thalamic hemorrhage with intraventricular extension.  Patient not on any antiplatelets/anticoagulations required reversal.  Blood pressure significantly elevated greater than 998 systolic-blood pressure goal to less than 338 systolic blood pressure.  Acute intraparenchymal hemorrhage in the right thalamus and basal ganglia with intraventricular extension with mild hydrocephalus  Plan -Admit to neuro ICU -BP goal less than 250 systolic blood pressure (higher target of 160 rather than 140 SBP as initial blood pressure greater than 539 systolic) -No antiplatelets/anticoagulants -Stat neurosurgery consult for EVD - Repeat CT head in the morning  Acute respiratory failure - appreciate PCCM assistance for ventilator management  Hypertensive Emergency - BP goal less than 767 systolic, on cleviprex  - continue oral BP meds once NG tube placed   This patient is neurologically critically ill due to [place].  She is at risk for significant risk of neurological worsening from cerebral edema,  death from brain herniation, heart failure, hemorrhage expansion,  infection, respiratory failure and seizure. This patient's care requires constant monitoring of vital signs, hemodynamics, respiratory and cardiac monitoring, review of multiple databases, neurological assessment, discussion with family, other specialists and medical decision making of high complexity.  I spent 45 minutes of neurocritical time in the care of this patient.

## 2019-07-18 NOTE — ED Notes (Signed)
Eber Jones, sister, [phone] would like an update on pt when available

## 2019-07-18 NOTE — Consult Note (Signed)
Reason for Consult: thalamic bleed with ivh Referring Physician: EDP  Yeva Bissette is an 59 y.o. female.   HPI:  59 year old female presented to the ED tonight with left sided hemiplegia. Patient was sedated and intubated for airway protection. Per neurology she was following commands on arrival to the ED   History reviewed. No pertinent past medical history.  History reviewed. No pertinent surgical history.  Allergies  Allergen Reactions  . Penicillins     Social History   Tobacco Use  . Smoking status: Not on file  Substance Use Topics  . Alcohol use: Not on file    History reviewed. No pertinent family history.   Review of Systems  Positive ROS:   All other systems have been reviewed and were otherwise negative with the exception of those mentioned in the HPI and as above.  Objective: Vital signs in last 24 hours: Temp:  [95.7 F (35.4 C)] 95.7 F (35.4 C) (03/15 2139) Pulse Rate:  [48-94] 62 (03/15 2142) Resp:  [7-20] 16 (03/15 2142) BP: (114-258)/(75-145) 114/75 (03/15 2142) SpO2:  [92 %-100 %] 100 % (03/15 2142) FiO2 (%):  [50 %] 50 % (03/15 2100)  General Appearance: sedated intubated Head: Normocephalic, without obvious abnormality, atraumatic Eyes: PERRL right 3, left 2, conjunctiva/corneas clear, EOM's intact, fundi benign, both eyes   Throat: ett Lungs: respirations unlabored Heart: bradycardic and regular rhythm  NEUROLOGIC:   Mental status:sedated on propofol, unable to fc Motor Exam - left hemiplegia, purposeful on the right  Sensory Exam - not tested Reflexes: not tested Coordination - not tested Gait - not tested Balance - not tested Cranial Nerves: I: smell Not tested  II: visual acuity  OS: na    OD: na  II: visual fields Full to confrontation  II: pupils Right 3, left 2, round, reactive to light  III,VII: ptosis None  III,IV,VI: extraocular muscles  uta  V: mastication uta  V: facial light touch sensation  uta  V,VII: corneal  reflex  Present  VII: facial muscle function - upper  uta  VII: facial muscle function - lower uta  VIII: hearing uta  IX: soft palate elevation  uta  IX,X: gag reflex present  XI: trapezius strength  uta  XI: sternocleidomastoid strength uta  XI: neck flexion strength  uta  XII: tongue strength  uta    Data Review Lab Results  Component Value Date   WBC 8.8 07/18/2019   HGB 13.6 07/18/2019   HCT 40.0 07/18/2019   MCV 87.4 07/18/2019   PLT 227 07/18/2019   Lab Results  Component Value Date   NA 138 07/18/2019   K 3.5 07/18/2019   CL 102 07/18/2019   CO2 24 07/18/2019   BUN 13 07/18/2019   CREATININE 0.60 07/18/2019   GLUCOSE 153 (H) 07/18/2019   Lab Results  Component Value Date   INR 1.0 07/18/2019    Radiology: CT HEAD CODE STROKE WO CONTRAST  Addendum Date: 07/18/2019   ADDENDUM REPORT: 07/18/2019 21:30 ADDENDUM: These results were called by telephone at the time of interpretation on 07/18/2019 at 9 pm to provider Dr. Lorraine Lax, who verbally acknowledged these results. Electronically Signed   By: Kellie Simmering DO   On: 07/18/2019 21:30   Result Date: 07/18/2019 CLINICAL DATA:  Code stroke. Neuro deficit, acute, stroke suspected. Additional history provided: Neuro deficit, acute, stroke suspected, left-sided weakness, dizziness. EXAM: CT HEAD WITHOUT CONTRAST TECHNIQUE: Contiguous axial images were obtained from the base of the skull through  the vertex without intravenous contrast. COMPARISON:  No pertinent prior studies available for comparison. FINDINGS: Brain: There is an acute parenchymal hemorrhage with moderate surrounding edema centered within the region of the posterior right basal ganglia and right thalamus. The dominant portion of the parenchymal hemorrhage measures 3.2 x 2.9 x 4.2 cm. Moderate volume intraventricular extension of hemorrhage into the lateral, third and fourth ventricles. Associated mass effect with partial effacement of the right lateral and third  ventricles. 4 mm leftward midline shift on the current examination. There is mild prominence of the lateral ventricles suspicious for early entrapment/hydrocephalus, particularly the right temporal horn. Vascular: No hyperdense vessel.  Atherosclerotic calcifications. Skull: Normal. Negative for fracture or focal lesion. Sinuses/Orbits: Visualized orbits demonstrate no acute abnormality. Large left maxillary sinus mucous retention cyst. Bilateral ethmoid and maxillary sinus mucosal thickening. No significant mastoid effusion. IMPRESSION: 3.2 x 2.9 x 4.2 cm acute parenchymal hemorrhage with surrounding edema centered within the posterior right basal ganglia and right thalamus. Moderate volume intraventricular extension. Mass effect with partial effacement of the right lateral and third ventricles, and 4 mm leftward midline shift. Mild prominence of the lateral ventricles suspicious for early entrapment/hydrocephalus. Electronically Signed: By: Jackey Loge DO On: 07/18/2019 20:50    Assessment/Plan: 59 year old female presented to the ED tonight with left hemiplegia. CT head showed a large acute intraparenchymal hemorrhage right basal ganglia and thalamus with extension into her ventricles and 51mm of shift. Consent was obtained by the daughter for EVD placement. EVD was placed at bedside and patient tolerated well. Please keep at 10cm H20 for drainage.    [name] Ladene Allocca 07/18/2019 10:09 PM

## 2019-07-18 NOTE — ED Triage Notes (Signed)
Pt transported from home as a code stroke. EMS arrived at pts home @ 2000, found pt lying on the floor, LSN while talking with daughter on the telephone. R sided gaze noted, nonverbal, drooling. # 18 R AC est by EMS, no blood thinners,

## 2019-07-18 NOTE — ED Notes (Signed)
Prop stopped by NS

## 2019-07-19 ENCOUNTER — Inpatient Hospital Stay (HOSPITAL_COMMUNITY): Payer: 59

## 2019-07-19 ENCOUNTER — Inpatient Hospital Stay: Payer: Self-pay

## 2019-07-19 DIAGNOSIS — I615 Nontraumatic intracerebral hemorrhage, intraventricular: Principal | ICD-10-CM

## 2019-07-19 DIAGNOSIS — J9601 Acute respiratory failure with hypoxia: Secondary | ICD-10-CM

## 2019-07-19 DIAGNOSIS — Z8679 Personal history of other diseases of the circulatory system: Secondary | ICD-10-CM

## 2019-07-19 DIAGNOSIS — I161 Hypertensive emergency: Secondary | ICD-10-CM

## 2019-07-19 DIAGNOSIS — J9602 Acute respiratory failure with hypercapnia: Secondary | ICD-10-CM

## 2019-07-19 DIAGNOSIS — I610 Nontraumatic intracerebral hemorrhage in hemisphere, subcortical: Secondary | ICD-10-CM

## 2019-07-19 DIAGNOSIS — I6389 Other cerebral infarction: Secondary | ICD-10-CM

## 2019-07-19 LAB — HEMOGLOBIN A1C
Hgb A1c MFr Bld: 5.7 % — ABNORMAL HIGH (ref 4.8–5.6)
Mean Plasma Glucose: 116.89 mg/dL

## 2019-07-19 LAB — RENAL FUNCTION PANEL
Albumin: 4 g/dL (ref 3.5–5.0)
Anion gap: 13 (ref 5–15)
BUN: 11 mg/dL (ref 6–20)
CO2: 23 mmol/L (ref 22–32)
Calcium: 9.8 mg/dL (ref 8.9–10.3)
Chloride: 98 mmol/L (ref 98–111)
Creatinine, Ser: 0.56 mg/dL (ref 0.44–1.00)
GFR calc Af Amer: >60 mL/min (ref >60)
GFR calc non Af Amer: >60 mL/min (ref >60)
Glucose, Bld: 130 mg/dL — ABNORMAL HIGH (ref 70–99)
Phosphorus: 2.1 mg/dL — ABNORMAL LOW (ref 2.5–4.6)
Potassium: 5.6 mmol/L — ABNORMAL HIGH (ref 3.5–5.1)
Sodium: 134 mmol/L — ABNORMAL LOW (ref 135–145)

## 2019-07-19 LAB — GLUCOSE, CAPILLARY
Glucose-Capillary: 118 mg/dL — ABNORMAL HIGH (ref 70–99)
Glucose-Capillary: 143 mg/dL — ABNORMAL HIGH (ref 70–99)
Glucose-Capillary: 142 mg/dL — ABNORMAL HIGH (ref 70–99)
Glucose-Capillary: 109 mg/dL — ABNORMAL HIGH (ref 70–99)
Glucose-Capillary: 108 mg/dL — ABNORMAL HIGH (ref 70–99)
Glucose-Capillary: 110 mg/dL — ABNORMAL HIGH (ref 70–99)

## 2019-07-19 LAB — POCT I-STAT 7, (LYTES, BLD GAS, ICA,H+H)
Acid-Base Excess: 2 mmol/L (ref 0.0–2.0)
Bicarbonate: 28.7 mmol/L — ABNORMAL HIGH (ref 20.0–28.0)
Calcium, Ion: 1.36 mmol/L (ref 1.15–1.40)
HCT: 40 % (ref 36.0–46.0)
Hemoglobin: 13.6 g/dL (ref 12.0–15.0)
O2 Saturation: 97 %
Patient temperature: 97.9
Potassium: 3.2 mmol/L — ABNORMAL LOW (ref 3.5–5.1)
Sodium: 134 mmol/L — ABNORMAL LOW (ref 135–145)
TCO2: 30 mmol/L (ref 22–32)
pCO2 arterial: 50.3 mmHg — ABNORMAL HIGH (ref 32.0–48.0)
pH, Arterial: 7.362 (ref 7.350–7.450)
pO2, Arterial: 90 mmHg (ref 83.0–108.0)

## 2019-07-19 LAB — BASIC METABOLIC PANEL
Anion gap: 16 — ABNORMAL HIGH (ref 5–15)
BUN: 8 mg/dL (ref 6–20)
CO2: 21 mmol/L — ABNORMAL LOW (ref 22–32)
Calcium: 10 mg/dL (ref 8.9–10.3)
Chloride: 102 mmol/L (ref 98–111)
Creatinine, Ser: 0.76 mg/dL (ref 0.44–1.00)
GFR calc Af Amer: >60 mL/min (ref >60)
GFR calc non Af Amer: >60 mL/min (ref >60)
Glucose, Bld: 150 mg/dL — ABNORMAL HIGH (ref 70–99)
Potassium: 4.1 mmol/L (ref 3.5–5.1)
Sodium: 139 mmol/L (ref 135–145)

## 2019-07-19 LAB — CBC
HCT: 41.1 % (ref 36.0–46.0)
Hemoglobin: 13.4 g/dL (ref 12.0–15.0)
MCH: 28.9 pg (ref 26.0–34.0)
MCHC: 32.6 g/dL (ref 30.0–36.0)
MCV: 88.8 fL (ref 80.0–100.0)
Platelets: 214 10*3/uL (ref 150–400)
RBC: 4.63 MIL/uL (ref 3.87–5.11)
RDW: 13.5 % (ref 11.5–15.5)
WBC: 11.9 10*3/uL — ABNORMAL HIGH (ref 4.0–10.5)
nRBC: 0 % (ref 0.0–0.2)

## 2019-07-19 LAB — RAPID URINE DRUG SCREEN, HOSP PERFORMED
Amphetamines: NOT DETECTED
Barbiturates: NOT DETECTED
Benzodiazepines: NOT DETECTED
Cocaine: NOT DETECTED
Opiates: NOT DETECTED
Tetrahydrocannabinol: NOT DETECTED

## 2019-07-19 LAB — TRIGLYCERIDES: Triglycerides: 607 mg/dL — ABNORMAL HIGH (ref <150)

## 2019-07-19 LAB — SODIUM
Sodium: 155 mmol/L — ABNORMAL HIGH (ref 135–145)
Sodium: 146 mmol/L — ABNORMAL HIGH (ref 135–145)

## 2019-07-19 LAB — ECHOCARDIOGRAM COMPLETE
Height: 60 in
Weight: 2645.52 oz

## 2019-07-19 LAB — MRSA PCR SCREENING: MRSA by PCR: NEGATIVE

## 2019-07-19 LAB — MAGNESIUM: Magnesium: 2 mg/dL (ref 1.7–2.4)

## 2019-07-19 MED ORDER — CHLORHEXIDINE GLUCONATE 0.12% ORAL RINSE (MEDLINE KIT)
15.0000 mL | Freq: Two times a day (BID) | OROMUCOSAL | Status: DC
  Administered 2019-07-19 – 2019-07-25 (×11): 15 mL via OROMUCOSAL

## 2019-07-19 MED ORDER — METRONIDAZOLE IN NACL 5-0.79 MG/ML-% IV SOLN
500.0000 mg | Freq: Three times a day (TID) | INTRAVENOUS | Status: AC
  Administered 2019-07-19 – 2019-07-24 (×15): 500 mg via INTRAVENOUS
  Filled 2019-07-19 (×15): qty 100

## 2019-07-19 MED ORDER — SODIUM PHOSPHATES 45 MMOLE/15ML IV SOLN
15.0000 mmol | Freq: Once | INTRAVENOUS | Status: AC
  Administered 2019-07-19: 15 mmol via INTRAVENOUS
  Filled 2019-07-19: qty 5

## 2019-07-19 MED ORDER — FENTANYL CITRATE (PF) 100 MCG/2ML IJ SOLN
50.0000 ug | INTRAMUSCULAR | Status: DC | PRN
  Administered 2019-07-20 – 2019-07-21 (×3): 50 ug via INTRAVENOUS
  Administered 2019-07-21 (×4): 100 ug via INTRAVENOUS
  Administered 2019-07-21: 50 ug via INTRAVENOUS
  Administered 2019-07-21 – 2019-07-22 (×5): 100 ug via INTRAVENOUS
  Administered 2019-07-31: 25 ug via INTRAVENOUS
  Administered 2019-08-01 – 2019-08-02 (×4): 100 ug via INTRAVENOUS
  Filled 2019-07-19 (×18): qty 2

## 2019-07-19 MED ORDER — PANTOPRAZOLE SODIUM 40 MG IV SOLR
40.0000 mg | Freq: Two times a day (BID) | INTRAVENOUS | Status: DC
  Administered 2019-07-19 – 2019-07-24 (×10): 40 mg via INTRAVENOUS
  Filled 2019-07-19 (×11): qty 40

## 2019-07-19 MED ORDER — SODIUM CHLORIDE 3 % IV SOLN: INTRAVENOUS | Status: DC

## 2019-07-19 MED ORDER — INSULIN ASPART 100 UNIT/ML ~~LOC~~ SOLN
0.0000 [IU] | SUBCUTANEOUS | Status: DC
  Administered 2019-07-19 – 2019-07-21 (×6): 1 [IU] via SUBCUTANEOUS
  Administered 2019-07-21: 2 [IU] via SUBCUTANEOUS
  Administered 2019-07-21 – 2019-07-23 (×5): 1 [IU] via SUBCUTANEOUS
  Administered 2019-07-23 – 2019-07-24 (×3): 2 [IU] via SUBCUTANEOUS
  Administered 2019-07-24 – 2019-07-25 (×5): 1 [IU] via SUBCUTANEOUS
  Administered 2019-07-25: 2 [IU] via SUBCUTANEOUS
  Administered 2019-07-25 (×2): 1 [IU] via SUBCUTANEOUS
  Administered 2019-07-26 (×4): 2 [IU] via SUBCUTANEOUS
  Administered 2019-07-26 (×2): 1 [IU] via SUBCUTANEOUS
  Administered 2019-07-27: 16:00:00 2 [IU] via SUBCUTANEOUS
  Administered 2019-07-27: 1 [IU] via SUBCUTANEOUS
  Administered 2019-07-27: 3 [IU] via SUBCUTANEOUS
  Administered 2019-07-27: 2 [IU] via SUBCUTANEOUS
  Administered 2019-07-27 – 2019-07-28 (×2): 1 [IU] via SUBCUTANEOUS
  Administered 2019-07-28: 2 [IU] via SUBCUTANEOUS
  Administered 2019-07-28 (×4): 1 [IU] via SUBCUTANEOUS
  Administered 2019-07-28 – 2019-07-29 (×5): 2 [IU] via SUBCUTANEOUS
  Administered 2019-07-29 (×2): 1 [IU] via SUBCUTANEOUS
  Administered 2019-07-30: 2 [IU] via SUBCUTANEOUS
  Administered 2019-07-30: 1 [IU] via SUBCUTANEOUS
  Administered 2019-07-30: 2 [IU] via SUBCUTANEOUS
  Administered 2019-07-30 – 2019-07-31 (×3): 1 [IU] via SUBCUTANEOUS
  Administered 2019-07-31: 2 [IU] via SUBCUTANEOUS
  Administered 2019-07-31: 1 [IU] via SUBCUTANEOUS
  Administered 2019-07-31 (×3): 2 [IU] via SUBCUTANEOUS
  Administered 2019-08-01 (×3): 1 [IU] via SUBCUTANEOUS
  Administered 2019-08-01: 2 [IU] via SUBCUTANEOUS
  Administered 2019-08-01 – 2019-08-02 (×4): 1 [IU] via SUBCUTANEOUS
  Administered 2019-08-02 (×3): 2 [IU] via SUBCUTANEOUS
  Administered 2019-08-03: 1 [IU] via SUBCUTANEOUS
  Administered 2019-08-03: 2 [IU] via SUBCUTANEOUS
  Administered 2019-08-03: 1 [IU] via SUBCUTANEOUS
  Administered 2019-08-03: 2 [IU] via SUBCUTANEOUS
  Administered 2019-08-03: 1 [IU] via SUBCUTANEOUS
  Administered 2019-08-04 (×2): 2 [IU] via SUBCUTANEOUS
  Administered 2019-08-04 (×2): 1 [IU] via SUBCUTANEOUS
  Administered 2019-08-04: 3 [IU] via SUBCUTANEOUS
  Administered 2019-08-05: 2 [IU] via SUBCUTANEOUS
  Administered 2019-08-05: 1 [IU] via SUBCUTANEOUS
  Administered 2019-08-05: 2 [IU] via SUBCUTANEOUS
  Administered 2019-08-05: 1 [IU] via SUBCUTANEOUS
  Administered 2019-08-05 – 2019-08-07 (×14): 2 [IU] via SUBCUTANEOUS
  Administered 2019-08-08 (×2): 3 [IU] via SUBCUTANEOUS
  Administered 2019-08-08 (×3): 2 [IU] via SUBCUTANEOUS
  Administered 2019-08-09: 5 [IU] via SUBCUTANEOUS
  Administered 2019-08-09 (×2): 7 [IU] via SUBCUTANEOUS

## 2019-07-19 MED ORDER — CHLORHEXIDINE GLUCONATE 0.12% ORAL RINSE (MEDLINE KIT)
15.0000 mL | Freq: Two times a day (BID) | OROMUCOSAL | Status: DC
  Administered 2019-07-19 – 2019-08-03 (×21): 15 mL via OROMUCOSAL

## 2019-07-19 MED ORDER — INFLUENZA VAC SPLIT QUAD 0.5 ML IM SUSY
0.5000 mL | PREFILLED_SYRINGE | INTRAMUSCULAR | Status: AC
  Administered 2019-07-25: 0.5 mL via INTRAMUSCULAR
  Filled 2019-07-19: qty 0.5

## 2019-07-19 MED ORDER — SENNOSIDES-DOCUSATE SODIUM 8.6-50 MG PO TABS
1.0000 | ORAL_TABLET | Freq: Two times a day (BID) | ORAL | Status: DC
  Administered 2019-07-19 – 2019-07-22 (×6): 1
  Filled 2019-07-19 (×6): qty 1

## 2019-07-19 MED ORDER — PROPOFOL 1000 MG/100ML IV EMUL
5.0000 ug/kg/min | INTRAVENOUS | Status: DC
  Administered 2019-07-19: 45 ug/kg/min via INTRAVENOUS
  Administered 2019-07-19 (×2): 65 ug/kg/min via INTRAVENOUS
  Administered 2019-07-19: 35 ug/kg/min via INTRAVENOUS
  Administered 2019-07-19: 45 ug/kg/min via INTRAVENOUS
  Administered 2019-07-19: 55 ug/kg/min via INTRAVENOUS
  Administered 2019-07-20 (×6): 65 ug/kg/min via INTRAVENOUS
  Administered 2019-07-20: 55 ug/kg/min via INTRAVENOUS
  Administered 2019-07-21 (×2): 65 ug/kg/min via INTRAVENOUS
  Administered 2019-07-21: 20 ug/kg/min via INTRAVENOUS
  Administered 2019-07-21 – 2019-07-23 (×6): 25 ug/kg/min via INTRAVENOUS
  Filled 2019-07-19 (×11): qty 100
  Filled 2019-07-19: qty 200
  Filled 2019-07-19 (×3): qty 100
  Filled 2019-07-19: qty 200
  Filled 2019-07-19 (×5): qty 100

## 2019-07-19 MED ORDER — POTASSIUM CHLORIDE 20 MEQ/15ML (10%) PO SOLN
30.0000 meq | ORAL | Status: AC
  Administered 2019-07-19 (×2): 30 meq
  Filled 2019-07-19 (×2): qty 30

## 2019-07-19 MED ORDER — SODIUM CHLORIDE 0.9 % IV SOLN
2.0000 g | INTRAVENOUS | Status: DC
  Administered 2019-07-19 – 2019-07-22 (×4): 2 g via INTRAVENOUS
  Filled 2019-07-19: qty 2
  Filled 2019-07-19: qty 20
  Filled 2019-07-19: qty 2
  Filled 2019-07-19: qty 20
  Filled 2019-07-19: qty 2

## 2019-07-19 MED ORDER — SODIUM CHLORIDE 0.9% FLUSH
10.0000 mL | Freq: Two times a day (BID) | INTRAVENOUS | Status: DC
  Administered 2019-07-19 – 2019-07-24 (×10): 10 mL
  Administered 2019-07-24: 20 mL
  Administered 2019-07-25 – 2019-08-06 (×24): 10 mL
  Administered 2019-08-07: 20 mL
  Administered 2019-08-07 – 2019-08-11 (×9): 10 mL
  Administered 2019-08-12: 30 mL
  Administered 2019-08-12 – 2019-08-18 (×12): 10 mL
  Administered 2019-08-18: 20 mL
  Administered 2019-08-19 – 2019-08-23 (×10): 10 mL
  Administered 2019-08-24: 30 mL
  Administered 2019-08-24 – 2019-08-31 (×13): 10 mL

## 2019-07-19 MED ORDER — FENTANYL CITRATE (PF) 100 MCG/2ML IJ SOLN
50.0000 ug | INTRAMUSCULAR | Status: AC | PRN
  Administered 2019-07-20 – 2019-07-21 (×3): 50 ug via INTRAVENOUS
  Filled 2019-07-19 (×3): qty 2

## 2019-07-19 MED ORDER — ORAL CARE MOUTH RINSE: 15.0000 mL | OROMUCOSAL | Status: DC

## 2019-07-19 MED ORDER — PNEUMOCOCCAL VAC POLYVALENT 25 MCG/0.5ML IJ INJ
0.5000 mL | INJECTION | INTRAMUSCULAR | Status: DC
  Filled 2019-07-19 (×2): qty 0.5

## 2019-07-19 MED ORDER — SODIUM CHLORIDE 0.9% FLUSH
10.0000 mL | INTRAVENOUS | Status: DC | PRN
  Administered 2019-08-05: 10 mL
  Administered 2019-08-17: 20 mL

## 2019-07-19 MED ORDER — ALBUTEROL SULFATE (2.5 MG/3ML) 0.083% IN NEBU: 2.5000 mg | INHALATION_SOLUTION | RESPIRATORY_TRACT | Status: DC | PRN

## 2019-07-19 MED ORDER — ORAL CARE MOUTH RINSE
15.0000 mL | OROMUCOSAL | Status: DC
  Administered 2019-07-19 – 2019-08-03 (×153): 15 mL via OROMUCOSAL

## 2019-07-19 MED ORDER — SODIUM CHLORIDE 3 % IV SOLN
INTRAVENOUS | Status: DC
  Administered 2019-07-19: 75 mL/h via INTRAVENOUS
  Administered 2019-07-19 – 2019-07-21 (×5): 50 mL/h via INTRAVENOUS
  Filled 2019-07-19 (×9): qty 500

## 2019-07-19 MED ORDER — BISACODYL 10 MG RE SUPP: 10.0000 mg | Freq: Every day | RECTAL | Status: DC | PRN

## 2019-07-19 NOTE — Progress Notes (Signed)
Patient ID: Haley Hernandez, female   DOB: May 17, 1960, 59 y.o.   MRN: [id number] ventric patent with bloody csf

## 2019-07-19 NOTE — Progress Notes (Signed)
OT Cancellation Note  Patient Details Name: Haley Hernandez MRN: [id number] DOB: 1960/06/14   Cancelled Treatment:    Reason Eval/Treat Not Completed: Active bedrest order;Medical issues which prohibited therapy(Bedrest, intubated, sedated. Will return as schedule allows.)  Mahesh Sizemore M Rafan Sanders Zaelynn Fuchs MSOT, OTR/L Acute Rehab Pager: [phone] Office: [phone] 07/19/2019, 7:22 AM

## 2019-07-19 NOTE — Progress Notes (Signed)
eLink Physician-Brief Progress Note Patient Name: Haley Hernandez DOB: 1961/02/01 MRN: [id number]   Date of Service  07/19/2019  HPI/Events of Note  KCL 3.2  eICU Interventions  KCL 30 meq via NG tube Q 4 hours x 2 doses.        Thomasene Lot Kili Gracy 07/19/2019, 1:53 AM

## 2019-07-19 NOTE — Progress Notes (Signed)
  Echocardiogram 2D Echocardiogram has been performed.  Haley Hernandez 07/19/2019, 3:17 PM

## 2019-07-19 NOTE — Progress Notes (Signed)
STROKE TEAM PROGRESS NOTE   INTERVAL HISTORY RN and daughter at bedside.  Patient still intubated, on sedation.  On Cleviprex for BP control.  Eyes disconjugate.  CT repeat showed slightly enlargement of the hematoma with increased midline shift, but EVD tip is inside hematoma.  Neurosurgery contacted.  Vitals:   07/19/19 1005 07/19/19 1015 07/19/19 1030 07/19/19 1059  BP: (!) 160/103 (!) 128/94 123/90   Pulse: (!) 109 89 82 (!) 102  Resp: 14 16 18  (!) 21  Temp:      TempSrc:      SpO2: 96% 98% 100% 100%  Weight:      Height:        CBC:  Recent Labs  Lab 07/18/19 2035 07/18/19 2047 07/19/19 0105 07/19/19 0123  WBC 8.8  --   --  11.9*  NEUTROABS 5.7  --   --   --   HGB 13.0   < > 13.6 13.4  HCT 40.4   < > 40.0 41.1  MCV 87.4  --   --  88.8  PLT 227  --   --  214   < > = values in this interval not displayed.    Basic Metabolic Panel:  Recent Labs  Lab 07/19/19 0123 07/19/19 0838  NA 134* 139  K 5.6* 4.1  CL 98 102  CO2 23 21*  GLUCOSE 130* 150*  BUN 11 8  CREATININE 0.56 0.76  CALCIUM 9.8 10.0  MG 2.0  --   PHOS 2.1*  --    Lipid Panel:     Component Value Date/Time   CHOL 156 05/09/2008 2106   TRIG 607 (H) 07/19/2019 0123   HDL 49 05/09/2008 2106   CHOLHDL 3.2 Ratio 05/09/2008 2106   VLDL 16 05/09/2008 2106   LDLCALC 91 05/09/2008 2106   HgbA1c:  Lab Results  Component Value Date   HGBA1C 5.7 (H) 07/19/2019   Urine Drug Screen:     Component Value Date/Time   LABOPIA NONE DETECTED 07/19/2019 0220   COCAINSCRNUR NONE DETECTED 07/19/2019 0220   LABBENZ NONE DETECTED 07/19/2019 0220   AMPHETMU NONE DETECTED 07/19/2019 0220   THCU NONE DETECTED 07/19/2019 0220   LABBARB NONE DETECTED 07/19/2019 0220    Alcohol Level No results found for: ETH  IMAGING past 24 hours CT HEAD WO CONTRAST  Result Date: 07/19/2019 CLINICAL DATA:  ICH. EXAM: CT HEAD WITHOUT CONTRAST TECHNIQUE: Contiguous axial images were obtained from the base of the skull through  the vertex without intravenous contrast. COMPARISON:  Head CT July 18, 2019 FINDINGS: Brain: Status post placement of a left frontal approach external ventricular drain coursing through the frontal horn of the lateral ventricles with the tip at the level of the right thalamus, at the center of the known hemorrhage. There is interval increase of the right thalamic hematoma now measuring approximately 4.0 by 3.1 by 3.0 cm with interval worsening of the leftward midline shift, now measuring 7 mm compared to 4 mm on prior. There is further extension of the hemorrhage into the ventricular system with blood seen in the bilateral lateral ventricles, third and fourth ventricles. There is however an interval decrease in size of the lateral ventricles. Vascular: Small calcified plaques are noted in the bilateral carotid siphons. Skull: Left frontal burr hole for EVD placement with adjacent mild subcutaneous emphysema. Sinuses/Orbits: Prominent bilateral superior ophthalmic vein is noted, progressed from prior CT, suggesting increased intracranial pressure. Mucosal thickening of the bilateral ethmoid cells, sphenoid and maxillary sinuses  with mucous retention cyst in the left maxillary sinus. The mastoids are clear. Other: None. IMPRESSION: 1. Interval placement of a left frontal approach external ventricular drain with the tip at the level of the right thalamus. Interval increase in the size of the right thalamic hematoma with interval worsening of the leftward midline shift, now measuring 7 mm compared to 4 mm on prior CT. There is further extension of the hemorrhage into the ventricular system with blood seen in the bilateral lateral ventricles, third and fourth ventricles. The size of the lateral ventricle has decreased. 2. Prominent bilateral superior ophthalmic vein, progressed from prior CT, suggesting increased intracranial pressure. These results were called by telephone at the time of interpretation on 07/19/2019 at  11:32 am to provider [facility] , who verbally acknowledged these results. Electronically Signed   By: Baldemar Lenis M.D.   On: 07/19/2019 11:34   DG Chest Portable 1 View  Result Date: 07/18/2019 CLINICAL DATA:  Endotracheal tube placement. EXAM: PORTABLE CHEST 1 VIEW COMPARISON:  February 03, 2008 FINDINGS: An endotracheal tube is seen with its distal tip approximately 4.0 cm from the carina. A nasogastric tube is noted with its distal tip overlying the body of the stomach. There is no evidence of an acute infiltrate, pleural effusion or pneumothorax. The heart size and mediastinal contours are within normal limits. The visualized skeletal structures are unremarkable. IMPRESSION: Endotracheal tube and nasogastric tube positioning, as described above, without evidence of acute or active cardiopulmonary disease. Electronically Signed   By: Aram Candela M.D.   On: 07/18/2019 22:09   ECHOCARDIOGRAM COMPLETE  Result Date: 07/19/2019    ECHOCARDIOGRAM REPORT   Patient Name:   PERCY COMP Date of Exam: 07/19/2019 Medical Rec #:  [id number]       Height:       60.0 in Accession #:    [id number]      Weight:       165.3 lb Date of Birth:  20-Jan-1961       BSA:          1.722 m Patient Age:    59 years        BP:           199/123 mmHg Patient Gender: F               HR:           1 bpm. Exam Location:  Inpatient Procedure: 2D Echo, Cardiac Doppler and Color Doppler Indications:    Stroke 434.91/I163.9  History:        Patient has prior history of Echocardiogram examinations, most                 recent 01/27/2008. COPD; Risk Factors:Sleep Apnea.  Sonographer:    Ross Ludwig RDCS (AE) Referring Phys: [id number] [facility]  Sonographer Comments: Patient is morbidly obese and echo performed with patient supine and on artificial respirator. Image acquisition challenging due to patient body habitus. IMPRESSIONS  1. Left ventricular ejection fraction, by estimation, is 60 to 65%. The left ventricle has  hyperdynamic function. The left ventricle has no regional wall motion abnormalities. There is severe concentric left ventricular hypertrophy. Indeterminate diastolic filling due to E-A fusion. Consider evalution for 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 given severe LV wall thickness and pericardial effusion with cMRI or PYP scan.  2. Right ventricular systolic function is normal. The right ventricular size is normal.  3. Left atrial size was moderately dilated.  4. The  mitral valve is grossly normal. Trivial mitral valve regurgitation.  5. The aortic valve is tricuspid. Aortic valve regurgitation is not visualized. FINDINGS  Left Ventricle: Left ventricular ejection fraction, by estimation, is 60 to 65%. The left ventricle has hyperdynamic function. The left ventricle has no regional wall motion abnormalities. The left ventricular internal cavity size was small. There is severe concentric left ventricular hypertrophy. Indeterminate diastolic filling due to E-A fusion. Right Ventricle: The right ventricular size is normal. No increase in right ventricular wall thickness. Right ventricular systolic function is normal. Left Atrium: Left atrial size was moderately dilated. Right Atrium: Right atrial size was normal in size. Pericardium: Trivial pericardial effusion is present. The pericardial effusion is posterior to the left ventricle. Mitral Valve: The mitral valve is grossly normal. Trivial mitral valve regurgitation. Tricuspid Valve: The tricuspid valve is grossly normal. Tricuspid valve regurgitation is trivial. Aortic Valve: The aortic valve is tricuspid. Aortic valve regurgitation is not visualized. Aortic valve mean gradient measures 5.0 mmHg. Aortic valve peak gradient measures 10.8 mmHg. Aortic valve area, by VTI measures 4.25 cm. Pulmonic Valve: The pulmonic valve was grossly normal. Pulmonic valve regurgitation is not visualized. Aorta: The aortic root and ascending aorta are structurally normal, with no evidence of  dilitation. Venous: IVC assessment for right atrial pressure unable to be performed due to mechanical ventilation. IAS/Shunts: There is right bowing of the interatrial septum, suggestive of elevated left atrial pressure. The interatrial septum was not well visualized.  LEFT VENTRICLE PLAX 2D LVIDd:         3.13 cm LVIDs:         2.23 cm LV PW:         2.81 cm LV IVS:        2.20 cm LVOT diam:     2.30 cm LV SV:         89 LV SV Index:   52 LVOT Area:     4.15 cm  RIGHT VENTRICLE             IVC RV Basal diam:  2.79 cm     IVC diam: 2.64 cm RV S prime:     23.60 cm/s TAPSE (M-mode): 2.7 cm LEFT ATRIUM             Index       RIGHT ATRIUM           Index LA diam:        1.60 cm 0.93 cm/m  RA Area:     17.30 cm LA Vol (A2C):   51.5 ml 29.91 ml/m RA Volume:   42.00 ml  24.40 ml/m LA Vol (A4C):   68.2 ml 39.61 ml/m LA Biplane Vol: 63.2 ml 36.71 ml/m  AORTIC VALVE AV Area (Vmax):    3.70 cm AV Area (Vmean):   3.58 cm AV Area (VTI):     4.25 cm AV Vmax:           164.00 cm/s AV Vmean:          110.000 cm/s AV VTI:            0.209 m AV Peak Grad:      10.8 mmHg AV Mean Grad:      5.0 mmHg LVOT Vmax:         146.00 cm/s LVOT Vmean:        94.800 cm/s LVOT VTI:          0.214 m LVOT/AV VTI ratio: 1.02  AORTA  Ao Root diam: 3.10 cm Ao Asc diam:  3.40 cm TRICUSPID VALVE TR Peak grad:   24.4 mmHg TR Vmax:        247.00 cm/s  SHUNTS Systemic VTI:  0.21 m Systemic Diam: 2.30 cm Lyman Bishop MD Electronically signed by Lyman Bishop MD Signature Date/Time: 07/19/2019/3:29:52 PM    Final    CT HEAD CODE STROKE WO CONTRAST  Addendum Date: 07/18/2019   ADDENDUM REPORT: 07/18/2019 21:30 ADDENDUM: These results were called by telephone at the time of interpretation on 07/18/2019 at 9 pm to provider Dr. Lorraine Lax, who verbally acknowledged these results. Electronically Signed   By: Kellie Simmering DO   On: 07/18/2019 21:30   Result Date: 07/18/2019 CLINICAL DATA:  Code stroke. Neuro deficit, acute, stroke suspected. Additional  history provided: Neuro deficit, acute, stroke suspected, left-sided weakness, dizziness. EXAM: CT HEAD WITHOUT CONTRAST TECHNIQUE: Contiguous axial images were obtained from the base of the skull through the vertex without intravenous contrast. COMPARISON:  No pertinent prior studies available for comparison. FINDINGS: Brain: There is an acute parenchymal hemorrhage with moderate surrounding edema centered within the region of the posterior right basal ganglia and right thalamus. The dominant portion of the parenchymal hemorrhage measures 3.2 x 2.9 x 4.2 cm. Moderate volume intraventricular extension of hemorrhage into the lateral, third and fourth ventricles. Associated mass effect with partial effacement of the right lateral and third ventricles. 4 mm leftward midline shift on the current examination. There is mild prominence of the lateral ventricles suspicious for early entrapment/hydrocephalus, particularly the right temporal horn. Vascular: No hyperdense vessel.  Atherosclerotic calcifications. Skull: Normal. Negative for fracture or focal lesion. Sinuses/Orbits: Visualized orbits demonstrate no acute abnormality. Large left maxillary sinus mucous retention cyst. Bilateral ethmoid and maxillary sinus mucosal thickening. No significant mastoid effusion. IMPRESSION: 3.2 x 2.9 x 4.2 cm acute parenchymal hemorrhage with surrounding edema centered within the posterior right basal ganglia and right thalamus. Moderate volume intraventricular extension. Mass effect with partial effacement of the right lateral and third ventricles, and 4 mm leftward midline shift. Mild prominence of the lateral ventricles suspicious for early entrapment/hydrocephalus. Electronically Signed: By: Kellie Simmering DO On: 07/18/2019 20:50   [place] EKG SITE RITE  Result Date: 07/19/2019 If Site Rite image not attached, placement could not be confirmed due to current cardiac rhythm.   PHYSICAL EXAM  Temp:  [95.7 F (35.4 C)-101.7 F (38.7  C)] 98 F (36.7 C) (03/16 2000) Pulse Rate:  [48-135] 73 (03/16 1945) Resp:  [7-27] 18 (03/16 1945) BP: (95-258)/(69-145) 120/82 (03/16 1945) SpO2:  [92 %-100 %] 100 % (03/16 1945) FiO2 (%):  [40 %-50 %] 40 % (03/16 2011) Weight:  [75 kg] 75 kg (03/15 2035)  General - Well nourished, well developed, intubated on sedation.  Ophthalmologic - fundi not visualized due to noncooperation.  Cardiovascular - Regular rhythm but tachycardia.  Neuro - intubated on sedation, eyes closed, not following commands. With forced eye opening, eyes disconjugate with right eye mid position and left eye down and out, not blinking to visual threat, doll's eyes present, not tracking, PERRL. Corneal reflex weak bilaterally, gag and cough present. Breathing over the vent.  Facial symmetry not able to test due to ET tube.  Tongue protrusion not cooperative. On pain stimulation, barely against gravity of RUE and slight withdraw of RLE, no movement of LUE and LLE. DTR 1+ and no babinski. Sensation, coordination and gait not tested.  ASSESSMENT/PLAN Ms. Timiya Howells is a 59 y.o. female with history  of ICH 7 yrs ago, HTN found unresponsive. Found to have L sided hemiplegia, R gaze, SBP 250.  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 Moderate IVH. Mass effect w/ partial effacment R lateral and 3rd ventricles. 4mm L midline shift. Suspicious for early hydrocephalus.   Repeat CT head 3/16 interval L frontal lobe EVD w/ tip at R thalamus. Interval increased in R ICH now w/ 7mm L midline shift. IVH extended into B lateral, 3rd and 4th ventricles. Prominent B superior ophthalmic vein suggestion increased ICP.   CT repeat in am  2D Echo EF 60-65%. No source of embolus. Severe LVH. 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  LDL pending   HgbA1c 5.7  Scd for VTE prophylaxis  No antithrombotic  prior to admission, now on No antithrombotic given hemorrhage   Therapy recommendations:  pending   Disposition:  pending   Cytotoxic Cerebral Edema Obstructive hydrocephalus Induced Hypernatremia  EVD placed 3/15 [name](Jones)  CT 3/16 am- hematoma enlargement - radiologist concerned w/ EVD tip placement. Verlin DikeKimberly Meyran notified  Started on 3% protocol  PICC placement  Na 134->139->155   Goal Na 150-155  Na q6h  Acute Respiratory Failure d/t ICH Probable Aspiration PNA  Intubated in ED  T-max 101.7  Flagyl 3/15>>  Ceftriaxone 3/15>>  CCM on board  Hypertensive Emergency  SBP > 250 on arrival  Home meds:  None listed  On Cleviprex gtt . SBP goal <140 . Long-term BP goal normotensive  Dysphagia . Secondary to stroke . NPO . Speech to see once exutbated   Other Stroke Risk Factors  Obesity, Body mass index is 32.29 kg/m., recommend weight loss, diet and exercise as appropriate   Hx stroke/TIA  ICH 7 yrs ago. No data available in EPIC  Other Active Problems  Hypokalemia 3.2->5.6->4.1 supplemented  Hospital day # 1  This patient is critically ill due to ICH, IVH, hydrocephalus status post EVD, hypertensive emergency and at significant risk of neurological worsening, death form hematoma expansion, obstructive hydrocephalus, cerebral edema, brain herniation. This patient's care requires constant monitoring of vital signs, hemodynamics, respiratory and cardiac monitoring, review of multiple databases, neurological assessment, discussion with family, other specialists and medical decision making of high complexity. I spent 40 minutes of neurocritical care time in the care of this patient.  I had long discussion with daughter at bedside, updated pt current condition, treatment plan and potential prognosis, and answered all the questions.  She expressed understanding and appreciation.   Marvel PlanJindong Lainee Lehrman, MD PhD Stroke Neurology 07/19/2019 8:32 PM   To contact  Stroke Continuity provider, please refer to [email or web address]. After hours, contact General Neurology

## 2019-07-19 NOTE — Plan of Care (Signed)
  Problem: Clinical Measurements: Goal: Cardiovascular complication will be avoided Outcome: Progressing   Problem: Elimination: Goal: Will not experience complications related to urinary retention Outcome: Progressing   

## 2019-07-19 NOTE — Progress Notes (Signed)
Peripherally Inserted Central Catheter/Midline Placement  The IV Nurse has discussed with the patient and/or persons authorized to consent for the patient, the purpose of this procedure and the potential benefits and risks involved with this procedure.  The benefits include less needle sticks, lab draws from the catheter, and the patient may be discharged home with the catheter. Risks include, but not limited to, infection, bleeding, blood clot (thrombus formation), and puncture of an artery; nerve damage and irregular heartbeat and possibility to perform a PICC exchange if needed/ordered by physician.  Alternatives to this procedure were also discussed.  Bard Power PICC patient education guide, fact sheet on infection prevention and patient information card has been provided to patient /or left at bedside.  Consent obtained from daughter due to altered mental status.  PICC/Midline Placement Documentation  PICC Triple Lumen 07/19/19 PICC Right Basilic 37 cm 0 cm (Active)  Indication for Insertion or Continuance of Line Administration of hyperosmolar/irritating solutions (i.e. TPN, Vancomycin, etc.) 07/19/19 1310  Exposed Catheter (cm) 0 cm 07/19/19 1310  Site Assessment Clean;Dry;Intact 07/19/19 1310  Lumen #1 Status Flushed;Saline locked;Blood return noted 07/19/19 1310  Lumen #2 Status Flushed;Saline locked;Blood return noted 07/19/19 1310  Lumen #3 Status Flushed;Saline locked;Blood return noted 07/19/19 1310  Dressing Type Transparent 07/19/19 1310  Dressing Status Clean;Dry;Intact 07/19/19 1310  Dressing Intervention New dressing 07/19/19 1310  Dressing Change Due 07/26/19 07/19/19 1310       Mikaeel Petrow, Lajean Manes 07/19/2019, 1:12 PM

## 2019-07-19 NOTE — Progress Notes (Signed)
Notified MD Xu of jump in Sodium from 139 to 155, orders placed for rate change of 3% Sodium chloride (now 53ml/hr).

## 2019-07-19 NOTE — Progress Notes (Signed)
PT Cancellation Note  Patient Details Name: Haley Hernandez MRN: [id number] DOB: [birth year]   Cancelled Treatment:    Reason Eval/Treat Not Completed: Medical issues which prohibited therapy  Patient intubated, sedated, and on bedrest. Will follow and proceed with evaluation when appropriate.    Jerolyn Center, PT Pager [phone]  [name] 07/19/2019, 8:19 AM

## 2019-07-19 NOTE — Progress Notes (Signed)
Patient transported to CT and back to room 4N15 without complications.

## 2019-07-19 NOTE — Progress Notes (Signed)
SLP Cancellation Note  Patient Details Name: Haley Hernandez MRN: [id number] DOB: 06-25-1960   Cancelled treatment:       Reason Eval/Treat Not Completed: Patient not medically ready, intubated. Will follow up as able.    Mahala Menghini., M.A. CCC-SLP Acute Rehabilitation Services Pager [phone] Office [phone]  07/19/2019, 7:49 AM

## 2019-07-19 NOTE — Consult Note (Signed)
NAME:  Haley Hernandez, MRN:  [id number], DOB:  26-Mar-1961, LOS: 1 ADMISSION DATE:  07/18/2019, CONSULTATION DATE:  07/19/2019 REFERRING MD:  Dr. Laurence Slate, CHIEF COMPLAINT:  ICH  Brief History   59 year old female with prior history ICH and HTN found unresponsive at home found to left hemiplegia and right gaze with SBP 250.  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  Intubated in ER for airway protection.  NSGY consulted and EVD placed.  Placed on cleviprex for blood pressure control and admitted to Neurology.  PCCM consulted for ventilator management.   History of present illness   HPI obtained from medical chart review as patient is intubated and sedated on mechanical ventilation.    59 year old female with hx of HTN, OSA, and prior ICH seven years ago presenting from home as code stroke after patient found unresponsive.  Last known normal 1945.  EMS found patient with left hemiplegia and right gaze and SBP 250.  No known anticoagulants.  Unclear if patient is taking any antihypertensives.  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 Intubated in ER for airway protection.  NSGY consulted and EVD placed.  No significant lab abnormalities, glucose 157, and coags normal.  CXR without abnormalities and stable placement of ETT.  Placed on cleviprex for blood pressure control and admitted to Neurology.  PCCM consulted for ventilator management.   Past Medical History  HTN OSA ICH (?2014)  Significant Hospital Events   3/15  Admitted  Consults:  NSGY PCCM  Procedures:  3/15 ETT >> 3/15 EVD >>  Significant Diagnostic Tests:  3/15 CTH >> 3.2 x 2.9 x 4.2 cm acute parenchymal hemorrhage with surrounding edema centered within the posterior right basal ganglia and right thalamus. Moderate volume intraventricular extension. Mass effect with partial  effacement of the right lateral and third ventricles, and 4 mm leftward midline shift. Mild prominence of the lateral ventricles suspicious for early entrapment/hydrocephalus.  Micro Data:  3/15 SARS2 / Flu A/B >> neg 3/15 MRSA PCR >>  Antimicrobials:  n/a  Interim history/subjective:  On propofol and cleviprex  Objective   Blood pressure (!) 142/88, pulse 72, temperature 97.9 F (36.6 C), temperature source Axillary, resp. rate 16, weight 75 kg, SpO2 100 %.    Vent Mode: PRVC FiO2 (%):  [50 %] 50 % Set Rate:  [16 bmp] 16 bmp Vt Set:  [400 mL] 400 mL PEEP:  [5 cmH20] 5 cmH20 Plateau Pressure:  [15 cmH20-27 cmH20] 15 cmH20  No intake or output data in the 24 hours ending 07/19/19 0008 Filed Weights   07/18/19 2035  Weight: 75 kg   Examination: General:  Adult female in NAD sedated/ intubated on mechanical ventilation  HEENT: MM pink/moist, 7.5 ETT at 22 at lip, OGT,  R pupil 4/ L 3- both non reactive, anicteric, left EVD at 10 cmH2O -bloody Neuro: sedated, localizes in RUE, withdrawals in LE, moving LUE- ?posturing, doesn't appear to be localization CV: rr, NSR, no murmur  PULM:  MV supported breaths, faint left upper wheeze, otherwise clear GI: obese, soft, bs active  Extremities: warm/dry, no LE edema  Skin: no rashes  Resolved Hospital Problem list    Assessment & Plan:   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 s/p EVD secondary to hypertensive crisis P:  Per Neurology and NSGY EVD currently  at 10 cmH20 cleviprex for SBP goal < 160 Serial neuro exams Further imaging per NSGY    Hypertensive crisis  Hx HTN, unclear if on home meds - EKG non acute P:  Tele monitoring Cleviprex for SBP goal < 160 Check UDS - noted on prior UDS to be cocaine positive 2009 Trend UOP/ BMET Check UA   Acute respiratory insufficiency Hx untreated OSA  P:  Full MV support, PRVC Based on slight resp acidosis on last ABG,  increase rate to 18 and recheck ABG in 1 hour  Wean FiO2/ PEEP for sat goals 94-99% Prn albuterol  VAP bundle PPI  Daily SBT/ WUA   Hyperglycemia P:  Check A1c CBG q 4/ SSI sensitive   Best practice:  Diet: NPO Pain/Anxiety/Delirium protocol (if indicated): PAD protocol propofol/ prn fentanyl / RASS goal 0-/1 VAP protocol (if indicated): yes DVT prophylaxis: SCDs only  GI prophylaxis: PPI Glucose control: SSI sensitive Mobility: BR Code Status: Full  Family Communication: per primary  Disposition: Neuro ICU   Labs   CBC: Recent Labs  Lab 07/18/19 2035 07/18/19 2047 07/18/19 2113  WBC 8.8  --   --   NEUTROABS 5.7  --   --   HGB 13.0 13.9 13.6  HCT 40.4 41.0 40.0  MCV 87.4  --   --   PLT 227  --   --     Basic Metabolic Panel: Recent Labs  Lab 07/18/19 2035 07/18/19 2047 07/18/19 2113  NA 136 137 138  K 3.6 3.3* 3.5  CL 99 102  --   CO2 24  --   --   GLUCOSE 157* 153*  --   BUN 11 13  --   CREATININE 0.76 0.60  --   CALCIUM 10.4*  --   --    GFR: CrCl cannot be calculated (Unknown ideal weight.). Recent Labs  Lab 07/18/19 2035  WBC 8.8    Liver Function Tests: Recent Labs  Lab 07/18/19 2035  AST 21  ALT 20  ALKPHOS 62  BILITOT 0.9  PROT 8.3*  ALBUMIN 4.1   No results for input(s): LIPASE, AMYLASE in the last 168 hours. No results for input(s): AMMONIA in the last 168 hours.  ABG    Component Value Date/Time   PHART 7.317 (L) 07/18/2019 2113   PCO2ART 57.1 (H) 07/18/2019 2113   PO2ART 107.0 07/18/2019 2113   HCO3 29.8 (H) 07/18/2019 2113   TCO2 32 07/18/2019 2113   O2SAT 98.0 07/18/2019 2113     Coagulation Profile: Recent Labs  Lab 07/18/19 2035  INR 1.0    Cardiac Enzymes: No results for input(s): CKTOTAL, CKMB, CKMBINDEX, TROPONINI in the last 168 hours.  HbA1C: No results found for: HGBA1C  CBG: No results for input(s): GLUCAP in the last 168 hours.  Review of Systems:   Unable   Past Medical History  She,   has no past medical history on file.   Surgical History   History reviewed. No pertinent surgical history.   Social History    Unable  Family History   Her family history is not on file.   Allergies Allergies  Allergen Reactions  . Penicillins      Home Medications  Prior to Admission medications   Not on File     CRITICAL CARE Performed by: Posey Boyer   Total critical care time: 40 minutes   Critical care time was exclusive of separately billable procedures and treating other patients.   Critical care was  necessary to treat or prevent imminent or life-threatening deterioration.   Critical care was time spent personally by me on the following activities: development of treatment plan with patient and/or surrogate as well as nursing, discussions with consultants, evaluation of patient's response to treatment, examination of patient, obtaining history from patient or surrogate, ordering and performing treatments and interventions, ordering and review of laboratory studies, ordering and review of radiographic studies, pulse oximetry and re-evaluation of patient's condition.  Kennieth Rad, MSN, AGACNP-BC [facility] 07/19/2019, 1:13 AM

## 2019-07-20 ENCOUNTER — Inpatient Hospital Stay (HOSPITAL_COMMUNITY): Payer: 59

## 2019-07-20 DIAGNOSIS — R1312 Dysphagia, oropharyngeal phase: Secondary | ICD-10-CM

## 2019-07-20 DIAGNOSIS — G911 Obstructive hydrocephalus: Secondary | ICD-10-CM

## 2019-07-20 DIAGNOSIS — J9600 Acute respiratory failure, unspecified whether with hypoxia or hypercapnia: Secondary | ICD-10-CM

## 2019-07-20 DIAGNOSIS — I611 Nontraumatic intracerebral hemorrhage in hemisphere, cortical: Secondary | ICD-10-CM

## 2019-07-20 LAB — CBC
HCT: 38.5 % (ref 36.0–46.0)
Hemoglobin: 12.5 g/dL (ref 12.0–15.0)
MCH: 28.9 pg (ref 26.0–34.0)
MCHC: 32.5 g/dL (ref 30.0–36.0)
MCV: 89.1 fL (ref 80.0–100.0)
Platelets: 200 10*3/uL (ref 150–400)
RBC: 4.32 MIL/uL (ref 3.87–5.11)
RDW: 13.9 % (ref 11.5–15.5)
WBC: 9.6 10*3/uL (ref 4.0–10.5)
nRBC: 0 % (ref 0.0–0.2)

## 2019-07-20 LAB — LIPID PANEL
Cholesterol: 180 mg/dL (ref 0–200)
HDL: 45 mg/dL (ref >40)
LDL Cholesterol: 63 mg/dL (ref 0–99)
Total CHOL/HDL Ratio: 4 RATIO
Triglycerides: 362 mg/dL — ABNORMAL HIGH (ref <150)
VLDL: 72 mg/dL — ABNORMAL HIGH (ref 0–40)

## 2019-07-20 LAB — BASIC METABOLIC PANEL
Anion gap: 12 (ref 5–15)
BUN: 5 mg/dL — ABNORMAL LOW (ref 6–20)
CO2: 23 mmol/L (ref 22–32)
Calcium: 9.7 mg/dL (ref 8.9–10.3)
Chloride: 111 mmol/L (ref 98–111)
Creatinine, Ser: 0.64 mg/dL (ref 0.44–1.00)
GFR calc Af Amer: >60 mL/min (ref >60)
GFR calc non Af Amer: >60 mL/min (ref >60)
Glucose, Bld: 145 mg/dL — ABNORMAL HIGH (ref 70–99)
Potassium: 3 mmol/L — ABNORMAL LOW (ref 3.5–5.1)
Sodium: 146 mmol/L — ABNORMAL HIGH (ref 135–145)

## 2019-07-20 LAB — TRIGLYCERIDES: Triglycerides: 355 mg/dL — ABNORMAL HIGH (ref <150)

## 2019-07-20 LAB — SODIUM
Sodium: >180 mmol/L (ref 135–145)
Sodium: 152 mmol/L — ABNORMAL HIGH (ref 135–145)
Sodium: 154 mmol/L — ABNORMAL HIGH (ref 135–145)

## 2019-07-20 LAB — GLUCOSE, CAPILLARY
Glucose-Capillary: 136 mg/dL — ABNORMAL HIGH (ref 70–99)
Glucose-Capillary: 127 mg/dL — ABNORMAL HIGH (ref 70–99)
Glucose-Capillary: 98 mg/dL (ref 70–99)
Glucose-Capillary: 102 mg/dL — ABNORMAL HIGH (ref 70–99)
Glucose-Capillary: 105 mg/dL — ABNORMAL HIGH (ref 70–99)
Glucose-Capillary: 134 mg/dL — ABNORMAL HIGH (ref 70–99)

## 2019-07-20 LAB — MAGNESIUM: Magnesium: 1.9 mg/dL (ref 1.7–2.4)

## 2019-07-20 LAB — PHOSPHORUS
Phosphorus: 2 mg/dL — ABNORMAL LOW (ref 2.5–4.6)
Phosphorus: 2 mg/dL — ABNORMAL LOW (ref 2.5–4.6)

## 2019-07-20 MED ORDER — VITAL HIGH PROTEIN PO LIQD: 1000.0000 mL | ORAL | Status: DC

## 2019-07-20 MED ORDER — PRO-STAT SUGAR FREE PO LIQD
60.0000 mL | Freq: Two times a day (BID) | ORAL | Status: DC
  Administered 2019-07-20 – 2019-07-25 (×11): 60 mL
  Filled 2019-07-20 (×11): qty 60

## 2019-07-20 MED ORDER — POTASSIUM CHLORIDE 20 MEQ/15ML (10%) PO SOLN
40.0000 meq | ORAL | Status: DC
  Filled 2019-07-20: qty 30

## 2019-07-20 MED ORDER — IOHEXOL 350 MG/ML SOLN
75.0000 mL | Freq: Once | INTRAVENOUS | Status: AC | PRN
  Administered 2019-07-20: 75 mL via INTRAVENOUS

## 2019-07-20 MED ORDER — POTASSIUM CHLORIDE 20 MEQ/15ML (10%) PO SOLN
40.0000 meq | ORAL | Status: AC
  Administered 2019-07-20 (×2): 40 meq
  Filled 2019-07-20: qty 30

## 2019-07-20 MED ORDER — POTASSIUM CHLORIDE 20 MEQ/15ML (10%) PO SOLN: 40.0000 meq | ORAL | Status: DC

## 2019-07-20 MED ORDER — LOSARTAN POTASSIUM 50 MG PO TABS
50.0000 mg | ORAL_TABLET | Freq: Every day | ORAL | Status: DC
  Administered 2019-07-20 – 2019-07-23 (×4): 50 mg
  Filled 2019-07-20 (×4): qty 1

## 2019-07-20 MED ORDER — HEPARIN SODIUM (PORCINE) 5000 UNIT/ML IJ SOLN
5000.0000 [IU] | Freq: Three times a day (TID) | INTRAMUSCULAR | Status: DC
  Administered 2019-07-20 – 2019-07-22 (×7): 5000 [IU] via SUBCUTANEOUS
  Filled 2019-07-20 (×7): qty 1

## 2019-07-20 MED ORDER — PRO-STAT SUGAR FREE PO LIQD: 30.0000 mL | Freq: Two times a day (BID) | ORAL | Status: DC

## 2019-07-20 MED ORDER — SODIUM CHLORIDE 23.4 % INJECTION (4 MEQ/ML) FOR IV ADMINISTRATION
120.0000 meq | Freq: Once | INTRAVENOUS | Status: AC
  Administered 2019-07-20: 120 meq via INTRAVENOUS
  Filled 2019-07-20: qty 30

## 2019-07-20 MED ORDER — CARVEDILOL 12.5 MG PO TABS
12.5000 mg | ORAL_TABLET | Freq: Two times a day (BID) | ORAL | Status: DC
  Administered 2019-07-20 – 2019-07-24 (×9): 12.5 mg
  Filled 2019-07-20 (×9): qty 1

## 2019-07-20 MED ORDER — VITAL HIGH PROTEIN PO LIQD
1000.0000 mL | ORAL | Status: DC
  Administered 2019-07-20 – 2019-07-25 (×6): 1000 mL

## 2019-07-20 NOTE — Progress Notes (Signed)
EEG completed, results pending. 

## 2019-07-20 NOTE — Progress Notes (Addendum)
Notified primary MD of excess secretions from mouth, especially after medications through OG.  KUB order placed--received call stating OG need to be advanced. Tube feedings turned off.  OG advanced and another KUB order placed.  1815 Called Radiology--spoke with Nadeen Landau MD to clarify results of KUB; OG is now in appropriate position.

## 2019-07-20 NOTE — Procedures (Signed)
Patient Name: [name]  MRN: [id number]  Epilepsy Attending: Charlsie Quest  Referring Physician/Provider: Dr Marvel Plan Date: 07/20/2019 Duration: 25.10 mins  Patient history: 58yo F with R basal ganglia ICH and alternating forced gaze deviation. EEG to evaluate for seizure  Level of alertness: coamtose  AEDs during EEG study: Propofol  Technical aspects: This EEG study was done with scalp electrodes positioned according to the 10-20 International system of electrode placement. Electrical activity was acquired at a sampling rate of 500Hz  and reviewed with a high frequency filter of 70Hz  and a low frequency filter of 1Hz . EEG data were recorded continuously and digitally stored.   DESCRIPTION: EEG showed continuous generalized 3-6hz  polymorphic theta-delta slowing. Hyperventilation and photic stimulation were not performed.  ABNORMALITY - Continuous slow, generalized   IMPRESSION: This study is suggestive of severe diffuse encephalopathy, non specific to etiology. No seizures or epileptiform discharges were seen throughout the recording.    Wilmary Levit 

## 2019-07-20 NOTE — Progress Notes (Addendum)
STROKE TEAM PROGRESS NOTE   INTERVAL HISTORY RN at bedside.  Patient still intubated, on sedation.  On Cleviprex for BP control.  Eyes initially left forced gaze, with doll's eye maneuver, they became right forced gaze, mins later, back to left forced gaze.  CT repeat this morning showed stable MLS, hematoma, hydrocephalus. EVD draining.   Vitals:   07/20/19 0800 07/20/19 0805 07/20/19 0810 07/20/19 0815  BP: (!) 153/99 (!) 151/95 (!) 147/93 120/81  Pulse: (!) 106 (!) 103 100 90  Resp: 12 11 11  (!) 6  Temp:      TempSrc:      SpO2: 100% 100% 98% 99%  Weight:      Height:        CBC:  Recent Labs  Lab 07/18/19 2035 07/18/19 2047 07/19/19 0123 07/20/19 0556  WBC 8.8   < > 11.9* 9.6  NEUTROABS 5.7  --   --   --   HGB 13.0   < > 13.4 12.5  HCT 40.4   < > 41.1 38.5  MCV 87.4   < > 88.8 89.1  PLT 227   < > 214 200   < > = values in this interval not displayed.    Basic Metabolic Panel:  Recent Labs  Lab 07/19/19 0123 07/19/19 0123 07/19/19 0838 07/19/19 1600 07/19/19 2138 07/20/19 0556  NA 134*   < > 139   < > 146* 146*  K 5.6*   < > 4.1  --   --  3.0*  CL 98   < > 102  --   --  111  CO2 23   < > 21*  --   --  23  GLUCOSE 130*   < > 150*  --   --  145*  BUN 11   < > 8  --   --  5*  CREATININE 0.56   < > 0.76  --   --  0.64  CALCIUM 9.8   < > 10.0  --   --  9.7  MG 2.0  --   --   --   --  1.9  PHOS 2.1*  --   --   --   --   --    < > = values in this interval not displayed.   Lipid Panel:     Component Value Date/Time   CHOL 180 07/20/2019 0556   TRIG 362 (H) 07/20/2019 0556   TRIG 355 (H) 07/20/2019 0556   HDL 45 07/20/2019 0556   CHOLHDL 4.0 07/20/2019 0556   VLDL 72 (H) 07/20/2019 0556   LDLCALC 63 07/20/2019 0556   HgbA1c:  Lab Results  Component Value Date   HGBA1C 5.7 (H) 07/19/2019   Urine Drug Screen:     Component Value Date/Time   LABOPIA NONE DETECTED 07/19/2019 0220   COCAINSCRNUR NONE DETECTED 07/19/2019 0220   LABBENZ NONE DETECTED  07/19/2019 0220   AMPHETMU NONE DETECTED 07/19/2019 0220   THCU NONE DETECTED 07/19/2019 0220   LABBARB NONE DETECTED 07/19/2019 0220    Alcohol Level No results found for: ETH  IMAGING past 24 hours CT ANGIO HEAD W OR WO CONTRAST  Result Date: 07/20/2019 CLINICAL DATA:  Cerebral hemorrhage suspected. EXAM: CT ANGIOGRAPHY HEAD TECHNIQUE: Multidetector CT imaging of the head was performed using the standard protocol during bolus administration of intravenous contrast. Multiplanar CT image reconstructions and MIPs were obtained to evaluate the vascular anatomy. CONTRAST:  52mL OMNIPAQUE IOHEXOL 350 MG/ML SOLN COMPARISON:  Head CT from yesterday FINDINGS: CTA HEAD Anterior circulation: The carotid arteries show minimal atheromatous changes without stenosis or irregularity. No branch occlusion, beading, or aneurysm. No enhancement within the right thalamic hematoma to suggest a vascular malformation. Posterior circulation: Dominant left vertebral artery. The vertebral and basilar arteries are smooth and widely patent. Significant posterior communicating artery contribution to the right PCA. No branch occlusion or beading. No aneurysm or signs of vascular malformation. Venous sinuses: Not opacified on this arterial study. Anatomic variants: None significant The right thalamic hematoma continues to measure up to 4 x 3 cm on axial slices. Diffuse intraventricular clot with ventricular dilatation that appears stable. The EVD is in stable position with tip at the right thalamic hematoma. Midline shift measures up to 7 mm. IMPRESSION: 1. No arterial finding to explain the patient's thalamic hemorrhage. 2. Size stable thalamic hematoma and midline shift (7 mm). 3. Stable EVD positioning and ventriculomegaly. Electronically Signed   By: Marnee Spring M.D.   On: 07/20/2019 06:51   CT HEAD WO CONTRAST  Result Date: 07/19/2019 CLINICAL DATA:  ICH. EXAM: CT HEAD WITHOUT CONTRAST TECHNIQUE: Contiguous axial images  were obtained from the base of the skull through the vertex without intravenous contrast. COMPARISON:  Head CT July 18, 2019 FINDINGS: Brain: Status post placement of a left frontal approach external ventricular drain coursing through the frontal horn of the lateral ventricles with the tip at the level of the right thalamus, at the center of the known hemorrhage. There is interval increase of the right thalamic hematoma now measuring approximately 4.0 by 3.1 by 3.0 cm with interval worsening of the leftward midline shift, now measuring 7 mm compared to 4 mm on prior. There is further extension of the hemorrhage into the ventricular system with blood seen in the bilateral lateral ventricles, third and fourth ventricles. There is however an interval decrease in size of the lateral ventricles. Vascular: Small calcified plaques are noted in the bilateral carotid siphons. Skull: Left frontal burr hole for EVD placement with adjacent mild subcutaneous emphysema. Sinuses/Orbits: Prominent bilateral superior ophthalmic vein is noted, progressed from prior CT, suggesting increased intracranial pressure. Mucosal thickening of the bilateral ethmoid cells, sphenoid and maxillary sinuses with mucous retention cyst in the left maxillary sinus. The mastoids are clear. Other: None. IMPRESSION: 1. Interval placement of a left frontal approach external ventricular drain with the tip at the level of the right thalamus. Interval increase in the size of the right thalamic hematoma with interval worsening of the leftward midline shift, now measuring 7 mm compared to 4 mm on prior CT. There is further extension of the hemorrhage into the ventricular system with blood seen in the bilateral lateral ventricles, third and fourth ventricles. The size of the lateral ventricle has decreased. 2. Prominent bilateral superior ophthalmic vein, progressed from prior CT, suggesting increased intracranial pressure. These results were called by  telephone at the time of interpretation on 07/19/2019 at 11:32 am to provider [facility] , who verbally acknowledged these results. Electronically Signed   By: Baldemar Lenis M.D.   On: 07/19/2019 11:34   ECHOCARDIOGRAM COMPLETE  Result Date: 07/19/2019    ECHOCARDIOGRAM REPORT   Patient Name:   Haley Hernandez Date of Exam: 07/19/2019 Medical Rec #:  [id number]       Height:       60.0 in Accession #:    [id number]      Weight:       165.3 lb Date of  Birth:  1960/12/15       BSA:          1.722 m Patient Age:    58 years        BP:           199/123 mmHg Patient Gender: F               HR:           1 bpm. Exam Location:  Inpatient Procedure: 2D Echo, Cardiac Doppler and Color Doppler Indications:    Stroke 434.91/I163.9  History:        Patient has prior history of Echocardiogram examinations, most                 recent 01/27/2008. COPD; Risk Factors:Sleep Apnea.  Sonographer:    Ross Ludwig RDCS (AE) Referring Phys: [id number] [facility]  Sonographer Comments: Patient is morbidly obese and echo performed with patient supine and on artificial respirator. Image acquisition challenging due to patient body habitus. IMPRESSIONS  1. Left ventricular ejection fraction, by estimation, is 60 to 65%. The left ventricle has hyperdynamic function. The left ventricle has no regional wall motion abnormalities. There is severe concentric left ventricular hypertrophy. Indeterminate diastolic filling due to E-A fusion. Consider evalution for 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 given severe LV wall thickness and pericardial effusion with cMRI or PYP scan.  2. Right ventricular systolic function is normal. The right ventricular size is normal.  3. Left atrial size was moderately dilated.  4. The mitral valve is grossly normal. Trivial mitral valve regurgitation.  5. The aortic valve is tricuspid. Aortic valve regurgitation is not visualized. FINDINGS  Left Ventricle: Left ventricular ejection fraction, by estimation, is 60 to 65%.  The left ventricle has hyperdynamic function. The left ventricle has no regional wall motion abnormalities. The left ventricular internal cavity size was small. There is severe concentric left ventricular hypertrophy. Indeterminate diastolic filling due to E-A fusion. Right Ventricle: The right ventricular size is normal. No increase in right ventricular wall thickness. Right ventricular systolic function is normal. Left Atrium: Left atrial size was moderately dilated. Right Atrium: Right atrial size was normal in size. Pericardium: Trivial pericardial effusion is present. The pericardial effusion is posterior to the left ventricle. Mitral Valve: The mitral valve is grossly normal. Trivial mitral valve regurgitation. Tricuspid Valve: The tricuspid valve is grossly normal. Tricuspid valve regurgitation is trivial. Aortic Valve: The aortic valve is tricuspid. Aortic valve regurgitation is not visualized. Aortic valve mean gradient measures 5.0 mmHg. Aortic valve peak gradient measures 10.8 mmHg. Aortic valve area, by VTI measures 4.25 cm. Pulmonic Valve: The pulmonic valve was grossly normal. Pulmonic valve regurgitation is not visualized. Aorta: The aortic root and ascending aorta are structurally normal, with no evidence of dilitation. Venous: IVC assessment for right atrial pressure unable to be performed due to mechanical ventilation. IAS/Shunts: There is right bowing of the interatrial septum, suggestive of elevated left atrial pressure. The interatrial septum was not well visualized.  LEFT VENTRICLE PLAX 2D LVIDd:         3.13 cm LVIDs:         2.23 cm LV PW:         2.81 cm LV IVS:        2.20 cm LVOT diam:     2.30 cm LV SV:         89 LV SV Index:   52 LVOT Area:     4.15 cm  RIGHT VENTRICLE  IVC RV Basal diam:  2.79 cm     IVC diam: 2.64 cm RV S prime:     23.60 cm/s TAPSE (M-mode): 2.7 cm LEFT ATRIUM             Index       RIGHT ATRIUM           Index LA diam:        1.60 cm 0.93 cm/m  RA  Area:     17.30 cm LA Vol (A2C):   51.5 ml 29.91 ml/m RA Volume:   42.00 ml  24.40 ml/m LA Vol (A4C):   68.2 ml 39.61 ml/m LA Biplane Vol: 63.2 ml 36.71 ml/m  AORTIC VALVE AV Area (Vmax):    3.70 cm AV Area (Vmean):   3.58 cm AV Area (VTI):     4.25 cm AV Vmax:           164.00 cm/s AV Vmean:          110.000 cm/s AV VTI:            0.209 m AV Peak Grad:      10.8 mmHg AV Mean Grad:      5.0 mmHg LVOT Vmax:         146.00 cm/s LVOT Vmean:        94.800 cm/s LVOT VTI:          0.214 m LVOT/AV VTI ratio: 1.02  AORTA Ao Root diam: 3.10 cm Ao Asc diam:  3.40 cm TRICUSPID VALVE TR Peak grad:   24.4 mmHg TR Vmax:        247.00 cm/s  SHUNTS Systemic VTI:  0.21 m Systemic Diam: 2.30 cm Zoila ShutterKenneth Hilty MD Electronically signed by Zoila ShutterKenneth Hilty MD Signature Date/Time: 07/19/2019/3:29:52 PM    Final    US EKG SITE RITE  Result Date: 07/19/2019 If Site Rite image not attached, placement could not be confirmed due to current cardiac rhythm.   PHYSICAL EXAM  Temp:  [97.9 F (36.6 C)-99.9 F (37.7 C)] 97.9 F (36.6 C) (03/17 0400) Pulse Rate:  [69-121] 90 (03/17 0815) Resp:  [6-27] 6 (03/17 0815) BP: (95-200)/(69-145) 120/81 (03/17 0815) SpO2:  [94 %-100 %] 99 % (03/17 0815) FiO2 (%):  [30 %-40 %] 30 % (03/17 0737)  General - Well nourished, well developed, intubated on sedation.  Ophthalmologic - fundi not visualized due to noncooperation.  Cardiovascular - Regular rhythm but tachycardia.  Neuro - intubated on sedation, eyes closed, not following commands. With forced eye opening, eyes initially left forced gaze, with doll's eye maneuver, they became right forced gaze, mins later, back to left forced gaze, doll's eyes present, not tracking, PERRL. Corneal reflex weak bilaterally, gag and cough present. Breathing over the vent.  Facial symmetry not able to test due to ET tube.  Tongue protrusion not cooperative. On pain stimulation, slight withdraw of RUE and no movement of RLE, LUE or LLE. DTR 1+  and no babinski. Sensation, coordination and gait not tested.  ASSESSMENT/PLAN Haley Hernandez is a 59 y.o. female with history of ICH 7 yrs ago, HTN found unresponsive. Found to have L sided hemiplegia, R gaze, SBP 250.  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 Moderate IVH. Mass effect w/ partial effacment R lateral and 3rd ventricles. 4mm L midline shift. Suspicious for early hydrocephalus.  Repeat CT head 3/16 interval L frontal lobe EVD w/ tip at R thalamus. Interval increased in R ICH now w/ 18mm L midline shift. IVH extended into B lateral, 3rd and 4th ventricles. Prominent B superior ophthalmic vein suggestion increased ICP.   CT repeat 3/17 - stable MLS 55mm, hematoma and hydrocephalus  2D Echo EF 60-65%. No source of embolus. Severe LVH. 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  EEG pending for alternating gaze  LDL 63   HgbA1c 5.7  subq heparin for VTE prophylaxis  No antithrombotic prior to admission, now on No antithrombotic given hemorrhage   Therapy recommendations:  pending   Disposition:  pending   Cytotoxic Cerebral Edema Obstructive hydrocephalus Induced Hypernatremia  EVD placed 3/15 Ronnald Ramp)  CT 3/16 - hematoma mild enlargement  CT 3/17 - stable hematoma and MLS  Started on 3% protocol  PICC placement  Na 134->139->155->146   Goal Na 150-155  Na q6h  Give one dose of 23.4% today for Na up to goal  Acute Respiratory Failure d/t ICH Probable Aspiration PNA  Intubated in ED  T-max 101.7->afebrile  Flagyl 3/15>>  Ceftriaxone 3/15>>  CCM on board  Hypertensive Emergency  SBP > 250 on arrival  Home meds:  Coreg, HCTZ and losartan  On Cleviprex gtt . SBP goal <160 . Resume home losartan 50 . Long-term BP goal normotensive  Dysphagia . Secondary to stroke . NPO . Speech to see once exutbated . Will  start trickle feeds   Other Stroke Risk Factors  Obesity, Body mass index is 32.29 kg/m., recommend weight loss, diet and exercise as appropriate   Hx stroke/TIA  ICH 7 yrs ago. No data available in EPIC  Other Active Problems  Hypokalemia 3.2->5.6->4.1->3.0 supplemented  Hospital day # 2  This patient is critically ill due to [place], IVH, hydrocephalus status post EVD, hypertensive emergency and at significant risk of neurological worsening, death form hematoma expansion, obstructive hydrocephalus, cerebral edema, brain herniation. This patient's care requires constant monitoring of vital signs, hemodynamics, respiratory and cardiac monitoring, review of multiple databases, neurological assessment, discussion with family, other specialists and medical decision making of high complexity. I spent 35 minutes of neurocritical care time in the care of this patient. I discussed with CCM attending Dr. Elsworth Soho.  Rosalin Hawking, MD PhD Stroke Neurology 07/20/2019 8:22 AM   To contact Stroke Continuity provider, please refer to [email or web address] After hours, contact General Neurology

## 2019-07-20 NOTE — Progress Notes (Signed)
PT Cancellation Note  Patient Details Name: Haley Hernandez MRN: [id number] DOB: [birth year]   Cancelled Treatment:    Reason Eval/Treat Not Completed: Medical issues which prohibited therapy active bedrest order and remains intubated, noted per chart became agitated this AM when propofol decreased. RN request to hold. Will follow.    Lillia Pauls, PT, DPT Acute Rehabilitation Services Pager [phone] Office [phone]    [name] 07/20/2019, 12:41 PM

## 2019-07-20 NOTE — Progress Notes (Addendum)
NAME:  Haley Hernandez, MRN:  [id number], DOB:  1961/03/17, LOS: 2 ADMISSION DATE:  07/18/2019, CONSULTATION DATE:  07/19/2019 REFERRING MD:  Dr. Lorraine Lax, CHIEF COMPLAINT:  ICH  Brief History   59 year old female with prior history ICH 7 yrs ago and HTN found unresponsive at home found to left hemiplegia and right gaze with SBP 250.  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  Intubated in ER for airway protection.  NSGY consulted and EVD placed.  Placed on cleviprex for blood pressure control and admitted to Neurology.  PCCM consulted for ventilator management.    Past Medical History  HTN OSA ICH (?2014)  Significant Hospital Events   3/15  Admitted  Consults:  NSGY PCCM  Procedures:  3/15 ETT >> 3/15 EVD >>  Significant Diagnostic Tests:  3/15 CTH >>  3.2 x 2.9 x 4.2 cm acute parenchymal hemorrhage with surrounding edema centered within the posterior right basal ganglia and right thalamus. Moderate volume intraventricular extension. Mass effect with partial effacement of the right lateral and third ventricles, and 4 mm leftward midline shift. Mild prominence of the lateral ventricles suspicious for early entrapment/hydrocephalus.  3/17 CT angio >> Size stable thalamic hematoma and midline shift (7 mm).  Stable EVD positioning and ventriculomegaly  Micro Data:  3/15 SARS2 / Flu A/B >> neg 3/15 MRSA PCR >> neg  Antimicrobials:  n/a  Interim history/subjective:   Remains critically ill, intubated On high-dose propofol and Cleviprex  Agitated per RN with breath stacking and hypertension when propofol decreased   Objective   Blood pressure 120/81, pulse 90, temperature 97.9 F (36.6 C), temperature source Axillary, resp. rate (!) 6, height 5' (1.524 m), weight 75 kg, SpO2 99 %.    Vent Mode: PRVC FiO2 (%):  [30 %-40 %] 40 % Set Rate:  [18 bmp] 18 bmp Vt Set:  [360 mL] 360 mL PEEP:  [5 cmH20] 5  cmH20 Pressure Support:  [12 cmH20] 12 cmH20 Plateau Pressure:  [12 cmH20-14 cmH20] 12 cmH20   Intake/Output Summary (Last 24 hours) at 07/20/2019 0858 Last data filed at 07/20/2019 0800 Gross per 24 hour  Intake 2641.88 ml  Output 1862 ml  Net 779.88 ml   Filed Weights   07/18/19 2035  Weight: 75 kg   Examination: General:  Adult female in NAD sedated/ intubated on mechanical ventilation  HEENT: MM pink/moist, 7.5 ETT , OGT,  R pupil 4/ L 3- both non reactive, anicteric,  Neuro: Unable to examine since sedated, localizes in RUE, withdrawals in LE,  CV: rr, NSR, no murmur  PULM: No accessory muscle use, bilateral ventilated breath sounds GI: obese, soft, bs active  Extremities: warm/dry, no LE edema  Skin: no rashes  Labs show mild hypernatremia, hypokalemia, no leukocytosis Chest x-ray 3/15 does not show any evidence of infiltrates  Resolved Hospital Problem list    Assessment & Plan:   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 s/p EVD secondary to hypertensive crisis P:  EVD currently at 10 cmH20, neurosurgery managing Serial neuro exams 3% saline and further imaging per Neurology Propofol and fentanyl as needed with goal RASS 0 to -1  Hypertensive crisis  Hx HTN, unclear if on home meds UDS neg - noted  to be cocaine positive 2009 P:  Tele monitoring Cleviprex for SBP goal < 160 Restart carvedilol and losartan, home meds   Acute respiratory failure Hx untreated OSA  P:  Prn albuterol  VAP bundle PPI  Daily SBT/ WUA but not ready for extubation  Aspiration pneumonitis-started on ceftriaxone and Flagyl -Obtain respiratory culture and complete 5 days  Hyperglycemia P:  Check A1c CBG q 4/ SSI sensitive  Start tube feeds  Hypo-kalemia will be repleted  Best practice:  Diet: NPO Pain/Anxiety/Delirium protocol (if indicated): PAD protocol propofol/ prn fentanyl / RASS goal 0-/1 VAP protocol (if indicated):  yes DVT prophylaxis: SCDs only  GI prophylaxis: PPI Glucose control: SSI sensitive Mobility: BR Code Status: Full  Family Communication: per neurology Disposition: Neuro ICU   The patient is critically ill with multiple organ systems failure and requires high complexity decision making for assessment and support, frequent evaluation and titration of therapies, application of advanced monitoring technologies and extensive interpretation of multiple databases. Critical Care Time devoted to patient care services described in this note independent of APP/resident  time is 32 minutes.   Cyril Mourning MD. Tonny Bollman. [facility]  If no response to pager , please call [phone]   07/20/2019

## 2019-07-20 NOTE — Progress Notes (Signed)
Initial Nutrition Assessment  DOCUMENTATION CODES:   Obesity unspecified  INTERVENTION:   Initiate Vital High Protein @ 25 ml/hr (600 ml/day) 60 ml Prostat BID  Provides: 1000 kcal, 112 grams protein, and 501 ml free water.   TF regimen cleviprex and propofol at current rate providing 3887 total kcal/day    NUTRITION DIAGNOSIS:   Inadequate oral intake related to inability to eat as evidenced by NPO status.  GOAL:   Provide needs based on ASPEN/SCCM guidelines  MONITOR:   TF tolerance, Vent status, I & O's  REASON FOR ASSESSMENT:   Ventilator    ASSESSMENT:   Pt with PMH of HTN, OSA, and prior ICH admitted from home with acute intraparenchymal hemorrhage in R thalamus and basal ganglia with IVH extension s/p EVD.   Pt discussed during ICU rounds and with RN.  Per RN trying to wean propofol.   Patient is currently intubated on ventilator support MV: 6.4 L/min Temp (24hrs), Avg:98.4 F (36.9 C), Min:97.5 F (36.4 C), Max:99.9 F (37.7 C)  Propofol: 33 ml/hr (75 mcg) provides: 871 kcal  Cleviprex: 42 ml/hr provides: 2016 kcal  Medications reviewed and include: 40 mEq KCl every 4 hours x 3, senokot-s Hypertonic saline Labs reviewed: Na 146 (H d/t hypertonic saline), K+ 3 (L), TG: 362 (H) Drain: 131    NUTRITION - FOCUSED PHYSICAL EXAM:    Most Recent Value  Orbital Region  No depletion  Upper Arm Region  No depletion  Thoracic and Lumbar Region  No depletion  Buccal Region  Unable to assess  Temple Region  No depletion  Clavicle Bone Region  No depletion  Clavicle and Acromion Bone Region  No depletion  Scapular Bone Region  No depletion  Dorsal Hand  No depletion  Patellar Region  No depletion  Anterior Thigh Region  No depletion  Posterior Calf Region  No depletion  Edema (RD Assessment)  None  Hair  Reviewed  Eyes  Unable to assess  Mouth  Unable to assess  Skin  Reviewed  Nails  Reviewed       Diet Order:   Diet Order            Diet  NPO time specified  Diet effective now              EDUCATION NEEDS:   No education needs have been identified at this time  Skin:  Skin Assessment: Reviewed RN Assessment  Last BM:  unknown  Height:   Ht Readings from Last 1 Encounters:  07/19/19 5' (1.524 m)    Weight:   Wt Readings from Last 1 Encounters:  07/18/19 75 kg    Ideal Body Weight:  45.4 kg  BMI:  Body mass index is 32.29 kg/m.  Estimated Nutritional Needs:   Kcal:  1250  Protein:  105 grams  Fluid:  >1.5 L/day  Cammy Copa., RD, LDN, CNSC See AMiON for contact information

## 2019-07-20 NOTE — Progress Notes (Signed)
Patient ID: Haley Hernandez, female   DOB: [birth year], 59 y.o.   MRN: [id number] The ventriculostomy was not draining this morning, and upon further inspection it was turned off the first stopcock, once I simply turned this on it dripped clear spinal fluid quickly.  CT scan from yesterday reviewed and while the catheter is a little long and the tip ends in the thalamus, it ends in the thalamus that hemorrhaged and not the "good" thalamus.  And therefore I think that further manipulation of the catheter to pull it back somewhat is not necessary, especially given the fact that this 1 is draining so well for her.

## 2019-07-20 NOTE — Progress Notes (Signed)
SLP Cancellation Note  Patient Details Name: Haley Hernandez MRN: [id number] DOB: 03-Oct-1960   Cancelled treatment:       Reason Eval/Treat Not Completed: Medical issues which prohibited therapy (on vent). Will continue to follow.    Mahala Menghini., M.A. CCC-SLP Acute Rehabilitation Services Pager [phone] Office [phone]  07/20/2019, 8:06 AM

## 2019-07-20 NOTE — Progress Notes (Signed)
OT Cancellation Note  Patient Details Name: Haley Hernandez MRN: [id number] DOB: [birth year]   Cancelled Treatment:    Reason Eval/Treat Not Completed: Medical issues which prohibited therapy; active bedrest order and remains intubated, noted per chart became agitated this AM when propofol decreased. RN request to hold. Will follow.  Marcy Siren, OT Acute Rehabilitation Services Pager [phone] Office [phone]   Orlando Penner 07/20/2019, 10:59 AM

## 2019-07-20 NOTE — Progress Notes (Signed)
Pt transported from ICU room to CT then returned to ICU room without complication.

## 2019-07-21 DIAGNOSIS — I612 Nontraumatic intracerebral hemorrhage in hemisphere, unspecified: Secondary | ICD-10-CM

## 2019-07-21 LAB — SODIUM
Sodium: 154 mmol/L — ABNORMAL HIGH (ref 135–145)
Sodium: 156 mmol/L — ABNORMAL HIGH (ref 135–145)
Sodium: 143 mmol/L (ref 135–145)
Sodium: 157 mmol/L — ABNORMAL HIGH (ref 135–145)

## 2019-07-21 LAB — CBC
HCT: 37.2 % (ref 36.0–46.0)
Hemoglobin: 12 g/dL (ref 12.0–15.0)
MCH: 29.7 pg (ref 26.0–34.0)
MCHC: 32.3 g/dL (ref 30.0–36.0)
MCV: 92.1 fL (ref 80.0–100.0)
Platelets: 197 10*3/uL (ref 150–400)
RBC: 4.04 MIL/uL (ref 3.87–5.11)
RDW: 14.9 % (ref 11.5–15.5)
WBC: 8.1 10*3/uL (ref 4.0–10.5)
nRBC: 0 % (ref 0.0–0.2)

## 2019-07-21 LAB — GLUCOSE, CAPILLARY
Glucose-Capillary: 126 mg/dL — ABNORMAL HIGH (ref 70–99)
Glucose-Capillary: 86 mg/dL (ref 70–99)
Glucose-Capillary: 117 mg/dL — ABNORMAL HIGH (ref 70–99)
Glucose-Capillary: 172 mg/dL — ABNORMAL HIGH (ref 70–99)
Glucose-Capillary: 140 mg/dL — ABNORMAL HIGH (ref 70–99)
Glucose-Capillary: 120 mg/dL — ABNORMAL HIGH (ref 70–99)

## 2019-07-21 LAB — BASIC METABOLIC PANEL
Anion gap: 9 (ref 5–15)
BUN: 10 mg/dL (ref 6–20)
CO2: 24 mmol/L (ref 22–32)
Calcium: 9.8 mg/dL (ref 8.9–10.3)
Chloride: 121 mmol/L — ABNORMAL HIGH (ref 98–111)
Creatinine, Ser: 0.54 mg/dL (ref 0.44–1.00)
GFR calc Af Amer: >60 mL/min (ref >60)
GFR calc non Af Amer: >60 mL/min (ref >60)
Glucose, Bld: 141 mg/dL — ABNORMAL HIGH (ref 70–99)
Potassium: 3 mmol/L — ABNORMAL LOW (ref 3.5–5.1)
Sodium: 154 mmol/L — ABNORMAL HIGH (ref 135–145)

## 2019-07-21 LAB — PHOSPHORUS: Phosphorus: 1.8 mg/dL — ABNORMAL LOW (ref 2.5–4.6)

## 2019-07-21 LAB — MAGNESIUM: Magnesium: 1.9 mg/dL (ref 1.7–2.4)

## 2019-07-21 MED ORDER — POTASSIUM CHLORIDE 20 MEQ/15ML (10%) PO SOLN
40.0000 meq | Freq: Once | ORAL | Status: AC
  Administered 2019-07-21: 40 meq
  Filled 2019-07-21: qty 30

## 2019-07-21 MED ORDER — POTASSIUM PHOSPHATES 15 MMOLE/5ML IV SOLN
30.0000 mmol | Freq: Once | INTRAVENOUS | Status: AC
  Administered 2019-07-21: 30 mmol via INTRAVENOUS
  Filled 2019-07-21: qty 10

## 2019-07-21 NOTE — Progress Notes (Signed)
NAME:  Haley Hernandez, MRN:  [id number], DOB:  1960/07/26, LOS: 3 ADMISSION DATE:  07/18/2019, CONSULTATION DATE:  07/19/2019 REFERRING MD:  Dr. Laurence Slate, CHIEF COMPLAINT:  ICH  Brief History   59 year old female with prior history ICH 7 yrs ago and HTN found unresponsive at home found to left hemiplegia and right gaze with SBP 250.  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  Intubated in ER for airway protection.  NSGY consulted and EVD placed.  Placed on cleviprex for blood pressure control and admitted to Neurology.  PCCM consulted for ventilator management.    Past Medical History  HTN OSA ICH (?2014)  Significant Hospital Events   3/15  Admitted  Consults:  NSGY PCCM  Procedures:  3/15 ETT >> 3/15 EVD >>  Significant Diagnostic Tests:  3/15 CTH >>  3.2 x 2.9 x 4.2 cm acute parenchymal hemorrhage with surrounding edema centered within the posterior right basal ganglia and right thalamus. Moderate volume intraventricular extension. Mass effect with partial effacement of the right lateral and third ventricles, and 4 mm leftward midline shift. Mild prominence of the lateral ventricles suspicious for early entrapment/hydrocephalus.  3/17 CT angio >> Size stable thalamic hematoma and midline shift (7 mm).  Stable EVD positioning and ventriculomegaly  Micro Data:  3/15 SARS2 / Flu A/B >> neg 3/15 MRSA PCR >> neg 3/17 resp cx >>   Antimicrobials:  3/16 ceftriaxone >> 3/16 Flagyl >>  Interim history/subjective:   Remains critically ill, intubated Sedated with high-dose propofol On Cleviprex for hypertension Ventriculostomy draining bloody CSF   Objective   Blood pressure (!) 145/88, pulse 82, temperature (!) 97 F (36.1 C), temperature source Axillary, resp. rate 12, height 5' (1.524 m), weight 75 kg, SpO2 97 %.    Vent Mode: CPAP;PSV FiO2 (%):  [40 %] 40 % Set Rate:  [18 bmp] 18 bmp Vt Set:  [360 mL]  360 mL PEEP:  [5 cmH20] 5 cmH20 Pressure Support:  [12 cmH20] 12 cmH20 Plateau Pressure:  [13 cmH20-14 cmH20] 14 cmH20   Intake/Output Summary (Last 24 hours) at 07/21/2019 7408 Last data filed at 07/21/2019 0900 Gross per 24 hour  Intake 3187.65 ml  Output 2159 ml  Net 1028.65 ml   Filed Weights   07/18/19 2035  Weight: 75 kg   Examination: General:  Adult female in NAD sedated/ intubated on mechanical ventilation  HEENT: MM pink/moist, 7.5 ETT , OGT,  R pupil 4/ L 3- both non reactive, anicteric,  Neuro: When propofol decreased, spontaneous movement of right upper extremity?  Localizes but not purposeful, some coughing with increased BP , left hemiplegia CV: rr, NSR, no murmur  PULM: No accessory muscle use, bilateral ventilated breath sounds GI: obese, soft, bs active  Extremities: warm/dry, no LE edema  Skin: no rashes  Labs show induced hypernatremia, hypokalemia, hypophosphatemia no leukocytosis Chest x-ray 3/15 does not show any evidence of infiltrates X-ray abdomen 3/17 shows good placement of OG tube  Resolved Hospital Problem list    Assessment & Plan:   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 s/p EVD secondary to hypertensive crisis P:  EVD at 10 cmH20, neurosurgery managing, draining bloody CSF Serial neuro exams off propofol 3% saline and further imaging per Neurology Propofol and fentanyl as needed with goal RASS 0 to -1, can switch to Precedex but trying to avoid blood pressure swings  Hypertensive crisis  Hx HTN,  unclear if on home meds UDS neg - noted  to be cocaine positive 2009 P:  Tele monitoring Cleviprex for SBP goal < 160 Restarted carvedilol and losartan, home meds   Acute respiratory failure Hx untreated OSA  P:  Prn albuterol  VAP bundle PPI  Daily SBT/ WUA but not ready for extubation  Aspiration pneumonitis-started on ceftriaxone and Flagyl -Obtain respiratory culture and complete 5  days  Hyperglycemia  A1c 5.7 P:  CBG q 4/ SSI sensitive  ct tube feeds  Hypo-kalemia and hypophosphatemia will be repleted  Prognosis guarded   Best practice:  Diet: NPO Pain/Anxiety/Delirium protocol (if indicated): PAD protocol propofol/ prn fentanyl / RASS goal 0-/1 VAP protocol (if indicated): yes DVT prophylaxis: SCDs only  GI prophylaxis: PPI Glucose control: SSI sensitive Mobility: BR Code Status: Full  Family Communication: per neurology Disposition: Neuro ICU   The patient is critically ill with multiple organ systems failure and requires high complexity decision making for assessment and support, frequent evaluation and titration of therapies, application of advanced monitoring technologies and extensive interpretation of multiple databases. Critical Care Time devoted to patient care services described in this note independent of APP/resident  time is 31 minutes.    Kara Mead MD. [facility]. [facility]  If no response to pager , please call [phone]   07/21/2019

## 2019-07-21 NOTE — Progress Notes (Signed)
Patient ID: Haley Hernandez, female   DOB: 01/14/1961, 59 y.o.   MRN: [id number] ventric patent and draining bloody csf. Prognosis for functional recovery grim

## 2019-07-21 NOTE — Progress Notes (Signed)
OT Cancellation Note  Patient Details Name: Meshelle Holness MRN: [id number] DOB: [birth year]   Cancelled Treatment:    Reason Eval/Treat Not Completed: Medical issues which prohibited therapy. (RN request hold as pt with elevated BP with minimal activity/movement. Intubated and sedated. Active bedrest. Will return as schedule allows and pt medically ready.)  Lilya Smitherman M Mintie Witherington Siona Coulston MSOT, OTR/L Acute Rehab Pager: [phone] Office: [phone] 07/21/2019, 8:06 AM

## 2019-07-21 NOTE — Progress Notes (Addendum)
STROKE TEAM PROGRESS NOTE   INTERVAL HISTORY RN at bedside.  She is sedated due to agitation. No significant changes overnight. EVD draining. Still on cleviprex. Still on 3%. NSY following.  CT repeat yesterday showed stable midline shift, hematoma, hydrocephalus. EVD draining.   Vitals:   07/21/19 0830 07/21/19 0845 07/21/19 0900 07/21/19 0915  BP: (!) 164/97 (!) 155/87 (!) 196/106 (!) 145/88  Pulse: (!) 110 78 (!) 104 82  Resp: 14 12 14 12   Temp:      TempSrc:      SpO2: 98% 98% 99% 97%  Weight:      Height:        CBC:  Recent Labs  Lab 07/18/19 2035 07/18/19 2047 07/20/19 0556 07/21/19 0500  WBC 8.8   < > 9.6 8.1  NEUTROABS 5.7  --   --   --   HGB 13.0   < > 12.5 12.0  HCT 40.4   < > 38.5 37.2  MCV 87.4   < > 89.1 92.1  PLT 227   < > 200 197   < > = values in this interval not displayed.    Basic Metabolic Panel:  Recent Labs  Lab 07/20/19 0556 07/20/19 1037 07/20/19 1745 07/20/19 2007 07/21/19 0500 07/21/19 0811  NA 146*   < >  --    < > 154* 156*  K 3.0*  --   --   --  3.0*  --   CL 111  --   --   --  121*  --   CO2 23  --   --   --  24  --   GLUCOSE 145*  --   --   --  141*  --   BUN 5*  --   --   --  10  --   CREATININE 0.64  --   --   --  0.54  --   CALCIUM 9.7  --   --   --  9.8  --   MG 1.9  --   --   --  1.9  --   PHOS  --    < > 2.0*  --  1.8*  --    < > = values in this interval not displayed.   Lipid Panel:     Component Value Date/Time   CHOL 180 07/20/2019 0556   TRIG 362 (H) 07/20/2019 0556   TRIG 355 (H) 07/20/2019 0556   HDL 45 07/20/2019 0556   CHOLHDL 4.0 07/20/2019 0556   VLDL 72 (H) 07/20/2019 0556   LDLCALC 63 07/20/2019 0556   HgbA1c:  Lab Results  Component Value Date   HGBA1C 5.7 (H) 07/19/2019   Urine Drug Screen:     Component Value Date/Time   LABOPIA NONE DETECTED 07/19/2019 0220   COCAINSCRNUR NONE DETECTED 07/19/2019 0220   LABBENZ NONE DETECTED 07/19/2019 0220   AMPHETMU NONE DETECTED 07/19/2019 0220    THCU NONE DETECTED 07/19/2019 0220   LABBARB NONE DETECTED 07/19/2019 0220    Alcohol Level No results found for: ETH  IMAGING past 24 hours DG Abd 1 View  Result Date: 07/20/2019 CLINICAL DATA:  Vomiting, increased secretions EXAM: ABDOMEN - 1 VIEW COMPARISON:  None. FINDINGS: General paucity of small bowel gas with nonobstructive pattern of colonic gas, scattered gas present to the rectum. No large burden of stool in the colon. Esophagogastric tube projects with tip just below the diaphragm, side port above the diaphragm. No free air in  the abdomen on supine radiograph. Right renal calculus. IMPRESSION: 1. Nonobstructive bowel gas pattern. 2. Esophagogastric tube tip just below the diaphragm, side port above the diaphragm. Recommend advancement to ensure subdiaphragmatic positioning of tip and side port. These results will be called to the ordering clinician or representative by the Radiologist Assistant, and communication documented in the PACS or [employer]. Electronically Signed   By: Lauralyn Primes M.D.   On: 07/20/2019 16:58   DG Abd Portable 1V  Addendum Date: 07/20/2019   ADDENDUM REPORT: 07/20/2019 18:28 ADDENDUM: Addendum created to correct a voice recognition air in the findings section of the report. The second sentence in the findings section should read: Tip and side port are NOW below the diaphragm in the stomach. Electronically Signed   By: Narda Rutherford M.D.   On: 07/20/2019 18:28   Result Date: 07/20/2019 CLINICAL DATA:  OG tube advanced. EXAM: PORTABLE ABDOMEN - 1 VIEW COMPARISON:  Radiograph earlier this day. FINDINGS: The enteric tube is but advanced. Tip and side port are no below the diaphragm in the stomach. The tip is directed towards the gastric cardia. Normal bowel gas pattern. IMPRESSION: Tip and side port of the enteric tube in the stomach. Electronically Signed: By: Narda Rutherford M.D. On: 07/20/2019 17:58    PHYSICAL EXAM  Temp:  [97 F (36.1 C)-98.8 F  (37.1 C)] 97 F (36.1 C) (03/18 0800) Pulse Rate:  [60-110] 82 (03/18 0915) Resp:  [12-24] 12 (03/18 0915) BP: (99-196)/(71-146) 145/88 (03/18 0915) SpO2:  [95 %-100 %] 97 % (03/18 0915) FiO2 (%):  [40 %] 40 % (03/18 0817)  General - Well nourished, well developed, intubated on sedation.  Ophthalmologic - fundi not visualized due to noncooperation.  Cardiovascular - Regular rhythm but tachycardia.  Neuro - intubated on sedation, eyes closed but opens with stimulation, not following commands. Slightly disconjugate midline gaze, doll's eyes present, not tracking, pupils pinpoint and sluggish. Corneal reflex +bilaterally, gag and cough present. Breathing over the vent.  Facial symmetry not able to test due to ET tube.  Tongue protrusion not cooperative. On pain stimulation, sright hand moving spontaneously, no movement of RLE, LUE or LLE. DTR 1+ and toes equiv. Sensation, coordination and gait not tested.  ASSESSMENT/PLAN [name] is a 59 y.o. female with history of ICH 7 yrs ago, HTN found unresponsive. Found to have L sided hemiplegia, R gaze, SBP 250.  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 Moderate IVH. Mass effect w/ partial effacment R lateral and 3rd ventricles. 11mm L midline shift. Suspicious for early hydrocephalus.   Repeat CT head 3/16 interval L frontal lobe EVD w/ tip at R thalamus. Interval increased in R ICH now w/ 15mm L midline shift. IVH extended into B lateral, 3rd and 4th ventricles. Prominent B superior ophthalmic vein suggestion increased ICP.   CT repeat 3/17 - stable MLS 79mm, hematoma and hydrocephalus  2D Echo EF 60-65%.No arterial finding for bleed . Severe LVH. 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  EEG severe diffuse encephalopathy   LDL 63   HgbA1c 5.7  subq heparin for VTE prophylaxis  No antithrombotic prior to  admission, now on No antithrombotic given hemorrhage   Therapy recommendations:  pending   Disposition:  pending   Cytotoxic Cerebral Edema Obstructive hydrocephalus Induced Hypernatremia  EVD placed 3/15 [name])  CT 3/16 - hematoma mild  enlargement  CT 3/17 - stable hematoma and MLS  Started on 3% protocol  PICC placement  Na 134->139->155->146 ->154  Goal Na 150-155  Na q6h  Acute Respiratory Failure d/t ICH Probable Aspiration PNA  Intubated in ED  T-max 101.7->afebrile  Flagyl 3/15>>  Ceftriaxone 3/15>>  CCM on board  Hypertensive Emergency  SBP > 250 on arrival  Home meds:  Coreg, HCTZ and losartan  On Cleviprex gtt, labetalol and hctz prn . SBP goal <160 . Resume home losartan 50 . Long-term BP goal normotensive  Dysphagia . Secondary to stroke . NPO . Speech to see once exutbated . Will start trickle feeds   Other Stroke Risk Factors  Obesity, Body mass index is 32.29 kg/m., recommend weight loss, diet and exercise as appropriate   Hx stroke/TIA  ICH 7 yrs ago. No data available in EPIC  Other Active Problems  Hypokalemia 3.2->5.6->4.1->3.0 supplemented  Hospital day # 3  This patient is critically ill due to [place], IVH, hydrocephalus status post EVD, hypertensive emergency and at significant risk of neurological worsening, death form hematoma expansion, obstructive hydrocephalus, cerebral edema, brain herniation. This patient's care requires constant monitoring of vital signs, hemodynamics, respiratory and cardiac monitoring, review of multiple databases, neurological assessment, discussion with family, other specialists and medical decision making of high complexity. I spent 30 minutes of neurocritical care time in the care of this patient.    Sarina Ill, MD Stroke Neurology 07/21/2019 10:46 AM   To contact Stroke Continuity provider, please refer to [email or web address] After hours, contact General Neurology

## 2019-07-21 NOTE — Progress Notes (Signed)
PT Cancellation/Discharge Note  Patient Details Name: Haley Hernandez MRN: [id number] DOB: November 29, 1960   Cancelled Treatment:    Reason Eval/Treat Not Completed: PT screened, no needs identified, will sign off  Discussed with RN patient's medical status and will sign-off at this time. Please re-order PT if pt becomes medically appropriate.   Jerolyn Center, PT Pager [phone]  [name] 07/21/2019, 11:01 AM

## 2019-07-22 ENCOUNTER — Inpatient Hospital Stay (HOSPITAL_COMMUNITY): Payer: 59

## 2019-07-22 LAB — CBC
HCT: 36.2 % (ref 36.0–46.0)
Hemoglobin: 11.8 g/dL — ABNORMAL LOW (ref 12.0–15.0)
MCH: 30.1 pg (ref 26.0–34.0)
MCHC: 32.6 g/dL (ref 30.0–36.0)
MCV: 92.3 fL (ref 80.0–100.0)
Platelets: 207 10*3/uL (ref 150–400)
RBC: 3.92 MIL/uL (ref 3.87–5.11)
RDW: 15.4 % (ref 11.5–15.5)
WBC: 9.1 10*3/uL (ref 4.0–10.5)
nRBC: 0.2 % (ref 0.0–0.2)

## 2019-07-22 LAB — BASIC METABOLIC PANEL
Anion gap: 10 (ref 5–15)
BUN: 17 mg/dL (ref 6–20)
CO2: 24 mmol/L (ref 22–32)
Calcium: 10.2 mg/dL (ref 8.9–10.3)
Chloride: 121 mmol/L — ABNORMAL HIGH (ref 98–111)
Creatinine, Ser: 0.65 mg/dL (ref 0.44–1.00)
GFR calc Af Amer: >60 mL/min (ref >60)
GFR calc non Af Amer: >60 mL/min (ref >60)
Glucose, Bld: 136 mg/dL — ABNORMAL HIGH (ref 70–99)
Potassium: 3.4 mmol/L — ABNORMAL LOW (ref 3.5–5.1)
Sodium: 155 mmol/L — ABNORMAL HIGH (ref 135–145)

## 2019-07-22 LAB — GLUCOSE, CAPILLARY
Glucose-Capillary: 124 mg/dL — ABNORMAL HIGH (ref 70–99)
Glucose-Capillary: 105 mg/dL — ABNORMAL HIGH (ref 70–99)
Glucose-Capillary: 108 mg/dL — ABNORMAL HIGH (ref 70–99)
Glucose-Capillary: 113 mg/dL — ABNORMAL HIGH (ref 70–99)
Glucose-Capillary: 121 mg/dL — ABNORMAL HIGH (ref 70–99)
Glucose-Capillary: 85 mg/dL (ref 70–99)

## 2019-07-22 LAB — MAGNESIUM: Magnesium: 1.9 mg/dL (ref 1.7–2.4)

## 2019-07-22 LAB — SODIUM
Sodium: 154 mmol/L — ABNORMAL HIGH (ref 135–145)
Sodium: 156 mmol/L — ABNORMAL HIGH (ref 135–145)
Sodium: 168 mmol/L (ref 135–145)

## 2019-07-22 LAB — CULTURE, RESPIRATORY W GRAM STAIN
Culture: NO GROWTH
Gram Stain: NONE SEEN

## 2019-07-22 LAB — PHOSPHORUS: Phosphorus: 3.2 mg/dL (ref 2.5–4.6)

## 2019-07-22 LAB — TRIGLYCERIDES: Triglycerides: 860 mg/dL — ABNORMAL HIGH (ref <150)

## 2019-07-22 MED ORDER — SODIUM CHLORIDE 3 % IV SOLN
INTRAVENOUS | Status: DC
  Administered 2019-07-22: 25 mL/h via INTRAVENOUS
  Administered 2019-07-23: 75 mL/h via INTRAVENOUS
  Administered 2019-07-23: 25 mL/h via INTRAVENOUS
  Administered 2019-07-24: 75 mL/h via INTRAVENOUS
  Administered 2019-07-24 – 2019-07-25 (×2): 50 mL/h via INTRAVENOUS
  Filled 2019-07-22 (×8): qty 500

## 2019-07-22 MED ORDER — FENTANYL CITRATE (PF) 100 MCG/2ML IJ SOLN: 50.0000 ug | Freq: Once | INTRAMUSCULAR | Status: DC

## 2019-07-22 MED ORDER — POTASSIUM CHLORIDE 20 MEQ/15ML (10%) PO SOLN
20.0000 meq | ORAL | Status: AC
  Administered 2019-07-22 (×2): 20 meq
  Filled 2019-07-22 (×2): qty 15

## 2019-07-22 MED ORDER — LOPERAMIDE HCL 2 MG PO CAPS
2.0000 mg | ORAL_CAPSULE | Freq: Two times a day (BID) | ORAL | Status: DC | PRN
  Filled 2019-07-22: qty 1

## 2019-07-22 MED ORDER — FENTANYL BOLUS VIA INFUSION
50.0000 ug | INTRAVENOUS | Status: DC | PRN
  Administered 2019-07-25 – 2019-07-26 (×4): 50 ug via INTRAVENOUS
  Filled 2019-07-22: qty 50

## 2019-07-22 MED ORDER — LOPERAMIDE HCL 1 MG/7.5ML PO SUSP
2.0000 mg | Freq: Two times a day (BID) | ORAL | Status: DC | PRN
  Administered 2019-07-22 – 2019-07-27 (×5): 2 mg
  Filled 2019-07-22 (×6): qty 15

## 2019-07-22 MED ORDER — FENTANYL 2500MCG IN NS 250ML (10MCG/ML) PREMIX INFUSION
0.0000 ug/h | INTRAVENOUS | Status: DC
  Administered 2019-07-22: 50 ug/h via INTRAVENOUS
  Administered 2019-07-23 – 2019-07-25 (×2): 100 ug/h via INTRAVENOUS
  Administered 2019-07-27: 25 ug/h via INTRAVENOUS
  Filled 2019-07-22 (×5): qty 250

## 2019-07-22 NOTE — Progress Notes (Signed)
Patient on loperamide PRN. If patient becomes febrile or has a rise in [facility], call the MD.

## 2019-07-22 NOTE — Progress Notes (Signed)
ventric patent and draining. No change in neurologic function. No acute events overnight

## 2019-07-22 NOTE — Progress Notes (Signed)
NAME:  Haley Hernandez, MRN:  [id number], DOB:  10/18/1960, LOS: 4 ADMISSION DATE:  07/18/2019, CONSULTATION DATE:  07/19/2019 REFERRING MD:  Dr. Lorraine Lax, CHIEF COMPLAINT:  ICH  Brief History   59 year old female with prior history ICH 7 yrs ago and HTN found unresponsive at home found to left hemiplegia and right gaze with SBP 250.  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  Intubated in ER for airway protection.  NSGY consulted and EVD placed.  Placed on cleviprex for blood pressure control and admitted to Neurology.  PCCM consulted for ventilator management.    Past Medical History  HTN OSA ICH (?2014)  Significant Hospital Events   3/15  Admitted  Consults:  NSGY PCCM  Procedures:  3/15 ETT >> 3/15 EVD >>  Significant Diagnostic Tests:  3/15 CTH >>  3.2 x 2.9 x 4.2 cm acute parenchymal hemorrhage with surrounding edema centered within the posterior right basal ganglia and right thalamus. Moderate volume intraventricular extension. Mass effect with partial effacement of the right lateral and third ventricles, and 4 mm leftward midline shift. Mild prominence of the lateral ventricles suspicious for early entrapment/hydrocephalus.  3/17 CT angio >> Size stable thalamic hematoma and midline shift (7 mm).  Stable EVD positioning and ventriculomegaly  Micro Data:  3/15 SARS2 / Flu A/B >> neg 3/15 MRSA PCR >> neg 3/17 resp cx >>   Antimicrobials:  3/16 ceftriaxone >> 3/16 Flagyl >>  Interim history/subjective:   Spontaneously opens eyes. Coughs off sedation.    Objective   Blood pressure 106/77, pulse 78, temperature 98.6 F (37 C), temperature source Oral, resp. rate 18, height 5' (1.524 m), weight 70.6 kg, SpO2 97 %.    Vent Mode: PSV FiO2 (%):  [40 %] 40 % Set Rate:  [18 bmp] 18 bmp Vt Set:  [360 mL] 360 mL PEEP:  [5 cmH20] 5 cmH20 Pressure Support:  [10 ERX54-00 cmH20] 10 cmH20 Plateau Pressure:   [12 cmH20-14 cmH20] 14 cmH20   Intake/Output Summary (Last 24 hours) at 07/22/2019 0739 Last data filed at 07/22/2019 0700 Gross per 24 hour  Intake 3387.24 ml  Output 2114 ml  Net 1273.24 ml   Filed Weights   07/18/19 2035 07/22/19 0500  Weight: 75 kg 70.6 kg   Physical Exam: General: Critically ill-appearing female, no acute distress HENT: [place], AT, ETT in place, left EVD draining Eyes: EOMI, no scleral icterus, downward gazie Respiratory: Clear to auscultation bilaterally.  No crackles, wheezing or rales Cardiovascular: RRR, -M/R/G, no JVD GI: BS+, soft, nontender Extremities:-Edema,-tenderness Neuro: Spontaneously opens eyes, does not follow commands. Withdraws in [place]. No withdrawal in remaining limbs Skin: Intact, no rashes or bruising GU: Purewick in place  CBC Latest Ref Rng & Units 07/22/2019 07/21/2019 07/20/2019  WBC 4.0 - 10.5 K/uL 9.1 8.1 9.6  Hemoglobin 12.0 - 15.0 g/dL 11.8(L) 12.0 12.5  Hematocrit 36.0 - 46.0 % 36.2 37.2 38.5  Platelets 150 - 400 K/uL 207 197 200   CMP Latest Ref Rng & Units 07/22/2019 07/21/2019 07/21/2019  Glucose 70 - 99 mg/dL 136(H) - -  BUN 6 - 20 mg/dL 17 - -  Creatinine 0.44 - 1.00 mg/dL 0.65 - -  Sodium 135 - 145 mmol/L 155(H) 157(H) 143  Potassium 3.5 - 5.1 mmol/L 3.4(L) - -  Chloride 98 - 111 mmol/L 121(H) - -  CO2 22 - 32 mmol/L 24 - -  Calcium 8.9 - 10.3 mg/dL 10.2 - -  Total Protein 6.5 - 8.1 g/dL - - -  Total Bilirubin 0.3 - 1.2 mg/dL - - -  Alkaline Phos 38 - 126 U/L - - -  AST 15 - 41 U/L - - -  ALT 0 - 44 U/L - - -     Resolved Hospital Problem list    Assessment & Plan:   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 s/p EVD secondary to hypertensive crisis P:  EVD at 10 cmH20, neurosurgery managing, draining bloody CSF Serial neuro exams off propofol - unchanged 3% saline and further imaging per Neurology. Currently off Propofol and fentanyl as needed with goal RASS 0 to -1,  can switch to Precedex but trying to avoid blood pressure swings  Hypertensive crisis  Hx HTN, unclear if on home meds UDS neg - noted  to be cocaine positive 2009 P:  Tele monitoring Cleviprex for SBP goal < 160 Restarted carvedilol and losartan, home meds  Acute respiratory failure Hx untreated OSA  P:  Full vent support Daily SBT/ WUA but not ready for extubation due to neuro status Prn albuterol  VAP bundle PPI   Aspiration pneumonitis-started on ceftriaxone and Flagyl -Obtain respiratory culture and complete 5 days ( end date 3/21)  Hyperglycemia  A1c 5.7 P:  CBG q 4/ SSI sensitive  ct tube feeds  Hypo-kalemia  -Replete  Prognosis guarded   Best practice:  Diet: TF Pain/Anxiety/Delirium protocol (if indicated): PAD protocol propofol/ prn fentanyl / RASS goal 0-/1 VAP protocol (if indicated): yes DVT prophylaxis: SCDs only  GI prophylaxis: PPI Glucose control: SSI sensitive Mobility: BR Code Status: Full  Family Communication: per neurology. CCM updated family on 3/19 Jon Gills) Disposition: Neuro ICU   The patient is critically ill with multiple organ systems failure and requires high complexity decision making for assessment and support, frequent evaluation and titration of therapies, application of advanced monitoring technologies and extensive interpretation of multiple databases.   Critical Care Time devoted to patient care services described in this note is 36 Minutes.   Mechele [name], M.D. [facility] Pulmonary/Critical Care Medicine 07/22/2019 7:39 AM   Please see Amion for pager number to reach on-call Pulmonary and Critical Care Team.

## 2019-07-22 NOTE — Progress Notes (Signed)
Pt did ps 10/5 for 8 minutes.  Placed pt back on full vent support due to insufficient respiratory effort.

## 2019-07-22 NOTE — Progress Notes (Signed)
STROKE TEAM PROGRESS NOTE   INTERVAL HISTORY RN at bedside.  She is sedated due to agitation. No significant changes overnight. EVD bloody drainage. Still on cleviprex prn, starting fentanyl and propofol. Still on 3%, turned off at this time pending Na.  NSY following.  CT repeat showed stable midline shift, hematoma, hydrocephalus.    Vitals:   07/22/19 1100 07/22/19 1116 07/22/19 1130 07/22/19 1200  BP: 128/82  131/86 (!) 149/92  Pulse: 68 70 80 68  Resp: 18 18 17 18   Temp:    98.6 F (37 C)  TempSrc:    Axillary  SpO2: 100% 100% 98% 100%  Weight:      Height:        CBC:  Recent Labs  Lab 07/18/19 2035 07/18/19 2047 07/21/19 0500 07/22/19 0222  WBC 8.8   < > 8.1 9.1  NEUTROABS 5.7  --   --   --   HGB 13.0   < > 12.0 11.8*  HCT 40.4   < > 37.2 36.2  MCV 87.4   < > 92.1 92.3  PLT 227   < > 197 207   < > = values in this interval not displayed.    Basic Metabolic Panel:  Recent Labs  Lab 07/21/19 0500 07/21/19 0811 07/22/19 0222 07/22/19 0800  NA 154*   < > 155* 154*  K 3.0*  --  3.4*  --   CL 121*  --  121*  --   CO2 24  --  24  --   GLUCOSE 141*  --  136*  --   BUN 10  --  17  --   CREATININE 0.54  --  0.65  --   CALCIUM 9.8  --  10.2  --   MG 1.9  --  1.9  --   PHOS 1.8*  --  3.2  --    < > = values in this interval not displayed.   Lipid Panel:     Component Value Date/Time   CHOL 180 07/20/2019 0556   TRIG 860 (H) 07/22/2019 0222   HDL 45 07/20/2019 0556   CHOLHDL 4.0 07/20/2019 0556   VLDL 72 (H) 07/20/2019 0556   LDLCALC 63 07/20/2019 0556   HgbA1c:  Lab Results  Component Value Date   HGBA1C 5.7 (H) 07/19/2019   Urine Drug Screen:     Component Value Date/Time   LABOPIA NONE DETECTED 07/19/2019 0220   COCAINSCRNUR NONE DETECTED 07/19/2019 0220   LABBENZ NONE DETECTED 07/19/2019 0220   AMPHETMU NONE DETECTED 07/19/2019 0220   THCU NONE DETECTED 07/19/2019 0220   LABBARB NONE DETECTED 07/19/2019 0220    Alcohol Level No results found  for: ETH  IMAGING past 24 hours DG Chest Port 1 View  Result Date: 07/22/2019 CLINICAL DATA:  Acute respiratory failure.  Brain hemorrhage EXAM: PORTABLE CHEST 1 VIEW COMPARISON:  Four days ago FINDINGS: Endotracheal tube tip at the clavicular heads. The enteric tube tip is at the proximal stomach. Interval PICC with tip at the distal SVC. Cardiomegaly. New small left pleural effusion. No edema or air leak IMPRESSION: 1. Unremarkable hardware positioning. 2. Mild atelectasis and or pleural fluid at the left base. 3. Cardiomegaly Electronically Signed   By: 07/24/2019 M.D.   On: 07/22/2019 07:19    PHYSICAL EXAM  Temp:  [98.3 F (36.8 C)-100 F (37.8 C)] 98.6 F (37 C) (03/19 1200) Pulse Rate:  [68-112] 68 (03/19 1200) Resp:  [13-23] 18 (03/19 1200)  BP: (87-186)/(58-138) 149/92 (03/19 1200) SpO2:  [90 %-100 %] 100 % (03/19 1200) FiO2 (%):  [40 %] 40 % (03/19 1200) Weight:  [70.6 kg] 70.6 kg (03/19 0500)  General - Well nourished, well developed, intubated on sedation.  Ophthalmologic - fundi not visualized due to noncooperation.  Cardiovascular - Regular rhythm but tachycardia.  Neuro - intubated on sedation, eyes closed but opens with stimulation, not following commands. Slightly disconjugate midline downward gaze, doll's eyes present, not tracking, pupils 66mm and sluggish. Corneal reflex +bilaterally, gag and cough present. Breathing over the vent.  Facial symmetry not able to test due to ET tube.  Tongue protrusion not cooperative.  right hand moving spontaneously, no movement of RLE, LUE or LLE on pain. DTR 1+ and toes equiv. Sensation, coordination and gait not tested.  ASSESSMENT/PLAN Ms. Georgeana Oertel is a 59 y.o. female with history of ICH 7 yrs ago, HTN found unresponsive. Found to have L sided hemiplegia, R gaze, SBP 250.  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 Moderate IVH. Mass effect w/ partial effacment R lateral and 3rd ventricles. 75mm L midline shift. Suspicious for early hydrocephalus.   Repeat CT head 3/16 interval L frontal lobe EVD w/ tip at R thalamus. Interval increased in R ICH now w/ 18mm L midline shift. IVH extended into B lateral, 3rd and 4th ventricles. Prominent B superior ophthalmic vein suggestion increased ICP.   CT repeat 3/17 - stable MLS 43mm, hematoma and hydrocephalus  2D Echo EF 60-65%.No arterial finding for bleed . Severe LVH. 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  EEG severe diffuse encephalopathy   LDL 63   HgbA1c 5.7  subq heparin for VTE prophylaxis discontinued due to bleed  No antithrombotic prior to admission, now on No antithrombotic given hemorrhage   Therapy recommendations:  pending   Disposition:  pending   Cytotoxic Cerebral Edema Obstructive hydrocephalus Induced Hypernatremia  EVD placed 3/15 Haley Hernandez)  CT 3/16 - hematoma mild enlargement  CT 3/17 - stable hematoma and MLS  Started on 3% protocol  PICC placement  Na 134->139->155->146 ->154->155 holding 3% pending Na this morning  Goal Na 150-155  Na q6h  Acute Respiratory Failure d/t ICH Probable Aspiration PNA  Intubated in ED  T-max 101.7->99.5 last evening today afebrile  Flagyl 3/15>>continues today 3/19  Ceftriaxone 3/15>> continues today 3/19  CCM on board  Hypertensive Emergency  SBP > 250 on arrival  Home meds:  Coreg, HCTZ and losartan  On Cleviprex gtt, . SBP goal <160 . Resume home losartan 50 . Long-term BP goal normotensive  Dysphagia . Secondary to stroke . NPO . Speech to see once exutbated . On trickle feeds 25   Other Stroke Risk Factors  Obesity, Body mass index is 30.4 kg/m., recommend weight loss, diet and exercise as appropriate   Hx stroke/TIA  ICH 7 yrs ago. No data available in EPIC  Other Active Problems  Hypokalemia 3.2->5.6->4.1->3.0 ->  3.4  Hospital day # 4  This patient is critically ill due to ICH, IVH, hydrocephalus status post EVD, hypertensive emergency and at significant risk of neurological worsening, death form hematoma expansion, obstructive hydrocephalus, cerebral edema, brain herniation. This patient's care requires constant monitoring of vital signs, hemodynamics, respiratory and cardiac monitoring, review of multiple databases, neurological assessment, discussion with family, other specialists and medical decision making of high complexity. I spent  30 minutes of neurocritical care time in the care of this patient.    Sarina Ill, MD Stroke Neurology 07/22/2019 1:02 PM   To contact Stroke Continuity provider, please refer to [email or web address] After hours, contact General Neurology

## 2019-07-22 NOTE — Progress Notes (Signed)
OT Cancellation Note  Patient Details Name: Haley Hernandez MRN: [id number] DOB: 20-Mar-1961   Cancelled Treatment:    Reason Eval/Treat Not Completed: Medical issues which prohibited therapy;Active bedrest order. Discussed with RN patient's medical status and will sign-off at this time. Please reconsult for OT evaluation when pt medically appropriate. Thank you.  Krishang Reading M Charity Tessier Roe Wilner MSOT, OTR/L Acute Rehab Pager: [phone] Office: [phone] 07/22/2019, 10:17 AM

## 2019-07-23 ENCOUNTER — Inpatient Hospital Stay (HOSPITAL_COMMUNITY): Payer: 59

## 2019-07-23 DIAGNOSIS — Z4659 Encounter for fitting and adjustment of other gastrointestinal appliance and device: Secondary | ICD-10-CM

## 2019-07-23 DIAGNOSIS — G936 Cerebral edema: Secondary | ICD-10-CM

## 2019-07-23 DIAGNOSIS — E876 Hypokalemia: Secondary | ICD-10-CM

## 2019-07-23 DIAGNOSIS — J9601 Acute respiratory failure with hypoxia: Secondary | ICD-10-CM | POA: Diagnosis present

## 2019-07-23 DIAGNOSIS — R509 Fever, unspecified: Secondary | ICD-10-CM | POA: Diagnosis present

## 2019-07-23 LAB — GLUCOSE, CAPILLARY
Glucose-Capillary: 112 mg/dL — ABNORMAL HIGH (ref 70–99)
Glucose-Capillary: 84 mg/dL (ref 70–99)
Glucose-Capillary: 154 mg/dL — ABNORMAL HIGH (ref 70–99)
Glucose-Capillary: 115 mg/dL — ABNORMAL HIGH (ref 70–99)
Glucose-Capillary: 123 mg/dL — ABNORMAL HIGH (ref 70–99)
Glucose-Capillary: 145 mg/dL — ABNORMAL HIGH (ref 70–99)

## 2019-07-23 LAB — CBC
HCT: 35.1 % — ABNORMAL LOW (ref 36.0–46.0)
Hemoglobin: 11.3 g/dL — ABNORMAL LOW (ref 12.0–15.0)
MCH: 30 pg (ref 26.0–34.0)
MCHC: 32.2 g/dL (ref 30.0–36.0)
MCV: 93.1 fL (ref 80.0–100.0)
Platelets: 184 10*3/uL (ref 150–400)
RBC: 3.77 MIL/uL — ABNORMAL LOW (ref 3.87–5.11)
RDW: 15.8 % — ABNORMAL HIGH (ref 11.5–15.5)
WBC: 7.9 10*3/uL (ref 4.0–10.5)
nRBC: 0 % (ref 0.0–0.2)

## 2019-07-23 LAB — BASIC METABOLIC PANEL
Anion gap: 9 (ref 5–15)
BUN: 27 mg/dL — ABNORMAL HIGH (ref 6–20)
CO2: 23 mmol/L (ref 22–32)
Calcium: 9.9 mg/dL (ref 8.9–10.3)
Chloride: 119 mmol/L — ABNORMAL HIGH (ref 98–111)
Creatinine, Ser: 0.68 mg/dL (ref 0.44–1.00)
GFR calc Af Amer: >60 mL/min (ref >60)
GFR calc non Af Amer: >60 mL/min (ref >60)
Glucose, Bld: 125 mg/dL — ABNORMAL HIGH (ref 70–99)
Potassium: 2.9 mmol/L — ABNORMAL LOW (ref 3.5–5.1)
Sodium: 151 mmol/L — ABNORMAL HIGH (ref 135–145)

## 2019-07-23 LAB — PHOSPHORUS: Phosphorus: 2.4 mg/dL — ABNORMAL LOW (ref 2.5–4.6)

## 2019-07-23 LAB — SODIUM
Sodium: 166 mmol/L (ref 135–145)
Sodium: 154 mmol/L — ABNORMAL HIGH (ref 135–145)
Sodium: 139 mmol/L (ref 135–145)
Sodium: 138 mmol/L (ref 135–145)

## 2019-07-23 LAB — MAGNESIUM: Magnesium: 1.9 mg/dL (ref 1.7–2.4)

## 2019-07-23 MED ORDER — POTASSIUM PHOSPHATES 15 MMOLE/5ML IV SOLN
30.0000 mmol | Freq: Once | INTRAVENOUS | Status: AC
  Administered 2019-07-23: 30 mmol via INTRAVENOUS
  Filled 2019-07-23: qty 10

## 2019-07-23 MED ORDER — POTASSIUM CHLORIDE 20 MEQ/15ML (10%) PO SOLN: 40.0000 meq | ORAL | Status: DC

## 2019-07-23 MED ORDER — AMLODIPINE BESYLATE 10 MG PO TABS
10.0000 mg | ORAL_TABLET | Freq: Every day | ORAL | Status: DC
  Filled 2019-07-23: qty 1

## 2019-07-23 MED ORDER — AMLODIPINE BESYLATE 10 MG PO TABS
10.0000 mg | ORAL_TABLET | Freq: Every day | ORAL | Status: DC
  Administered 2019-07-23 – 2019-08-01 (×10): 10 mg
  Filled 2019-07-23 (×9): qty 1

## 2019-07-23 MED ORDER — LOSARTAN POTASSIUM 50 MG PO TABS
50.0000 mg | ORAL_TABLET | Freq: Two times a day (BID) | ORAL | Status: DC
  Administered 2019-07-23 – 2019-07-24 (×2): 50 mg
  Filled 2019-07-23 (×2): qty 1

## 2019-07-23 MED ORDER — POTASSIUM CHLORIDE 20 MEQ/15ML (10%) PO SOLN
40.0000 meq | ORAL | Status: AC
  Administered 2019-07-23: 40 meq

## 2019-07-23 MED ORDER — POTASSIUM CHLORIDE 20 MEQ/15ML (10%) PO SOLN
40.0000 meq | ORAL | Status: DC
  Filled 2019-07-23: qty 30

## 2019-07-23 MED ORDER — AMLODIPINE BESYLATE 10 MG PO TABS: 10.0000 mg | ORAL_TABLET | Freq: Every day | ORAL | Status: DC

## 2019-07-23 MED ORDER — SODIUM CHLORIDE 0.9 % IV SOLN
INTRAVENOUS | Status: DC | PRN
  Administered 2019-07-23: 500 mL via INTRAVENOUS

## 2019-07-23 MED ORDER — SODIUM CHLORIDE 0.9 % IV SOLN
2.0000 g | Freq: Three times a day (TID) | INTRAVENOUS | Status: DC
  Administered 2019-07-23 – 2019-07-27 (×13): 2 g via INTRAVENOUS
  Filled 2019-07-23 (×16): qty 2

## 2019-07-23 NOTE — Progress Notes (Signed)
SLP Cancellation Note  Patient Details Name: Haley Hernandez MRN: [id number] DOB: Jul 05, 1960   Cancelled treatment:        Pt remains on vent support. Will follow.   Royce Macadamia 07/23/2019, 8:52 AM

## 2019-07-23 NOTE — Progress Notes (Signed)
Pharmacy Antibiotic Note  Haley Hernandez is a 59 y.o. female admitted on 07/18/2019 with ICH, now concerned for HAP. Pharmacy has been consulted for cefepime dosing. Patient was originally getting CTX + Flagyl for suspected aspiration PNA.   No leukocytosis this morning, spiked fever of 101.2 overnight with continued fever this morning. There is now concern for hospital acquired gram negative infection.  Plan: - Start cefepime 2g IV q8hr - Continue Flagyl per MD - Monitor renal function, cultures and sensitivities  Height: 5' (152.4 cm) Weight: 156 lb 4.9 oz (70.9 kg) IBW/kg (Calculated) : 45.5  Temp (24hrs), Avg:99.9 F (37.7 C), Min:98.6 F (37 C), Max:101.2 F (38.4 C)  Recent Labs  Lab 07/19/19 0123 07/19/19 0123 07/19/19 0838 07/20/19 0556 07/21/19 0500 07/22/19 0222 07/23/19 0500  WBC 11.9*  --   --  9.6 8.1 9.1 7.9  CREATININE 0.56   < > 0.76 0.64 0.54 0.65 0.68   < > = values in this interval not displayed.    Estimated Creatinine Clearance: 68.2 mL/min (by C-G formula based on SCr of 0.68 mg/dL).    Allergies  Allergen Reactions  . Penicillins     Unable to verify reactions at this time/ Tolerated ceftriaxone March 2021    Antimicrobials this admission: CTX 3/16 >> 3/19 Flagyl 3/16 >>  Cefepime 3/20 >>  Dose adjustments this admission:  Microbiology results: 3/20 BCx: sent 3/20 RespCx: sent 3/17 RespCx: No growth x2 days 3/15 MRSA PCR: Negative  Thank you for allowing pharmacy to be a part of this patient's care.  Ellison Carwin, PharmD PGY1 Pharmacy Resident

## 2019-07-23 NOTE — Progress Notes (Signed)
STROKE TEAM PROGRESS NOTE   INTERVAL HISTORY RN and daughter at bedside. Intubated on vent. She is still on fentanyl and propofol and cleviprex. She is open eyes on voice but not following commands, left gaze preference and blinking to visual threat on the left but not on the right. Not moving extremities on pain. EVD continues to drain.    Vitals:   07/23/19 0410 07/23/19 0500 07/23/19 0600 07/23/19 0605  BP: (!) 149/93 140/85 (!) 169/98 (!) 158/93  Pulse: 98 92 97 97  Resp: 18 18 18 18   Temp:      TempSrc:      SpO2: 100% 99% 100% 99%  Weight:  70.9 kg    Height:        CBC:  Recent Labs  Lab 07/18/19 2035 07/18/19 2047 07/22/19 0222 07/23/19 0500  WBC 8.8   < > 9.1 7.9  NEUTROABS 5.7  --   --   --   HGB 13.0   < > 11.8* 11.3*  HCT 40.4   < > 36.2 35.1*  MCV 87.4   < > 92.3 93.1  PLT 227   < > 207 184   < > = values in this interval not displayed.    Basic Metabolic Panel:  Recent Labs  Lab 07/21/19 0500 07/21/19 0811 07/22/19 0222 07/22/19 0800 07/22/19 2000 07/23/19 0200  NA 154*   < > 155*   < > 168* 166*  K 3.0*  --  3.4*  --   --   --   CL 121*  --  121*  --   --   --   CO2 24  --  24  --   --   --   GLUCOSE 141*  --  136*  --   --   --   BUN 10  --  17  --   --   --   CREATININE 0.54  --  0.65  --   --   --   CALCIUM 9.8  --  10.2  --   --   --   MG 1.9  --  1.9  --   --   --   PHOS 1.8*  --  3.2  --   --   --    < > = values in this interval not displayed.   Lipid Panel:     Component Value Date/Time   CHOL 180 07/20/2019 0556   TRIG 860 (H) 07/22/2019 0222   HDL 45 07/20/2019 0556   CHOLHDL 4.0 07/20/2019 0556   VLDL 72 (H) 07/20/2019 0556   LDLCALC 63 07/20/2019 0556   HgbA1c:  Lab Results  Component Value Date   HGBA1C 5.7 (H) 07/19/2019   Urine Drug Screen:     Component Value Date/Time   LABOPIA NONE DETECTED 07/19/2019 0220   COCAINSCRNUR NONE DETECTED 07/19/2019 0220   LABBENZ NONE DETECTED 07/19/2019 0220   AMPHETMU NONE  DETECTED 07/19/2019 0220   THCU NONE DETECTED 07/19/2019 0220   LABBARB NONE DETECTED 07/19/2019 0220    Alcohol Level No results found for: ETH  IMAGING past 24 hours No results found.  PHYSICAL EXAM  Temp:  [98.6 F (37 C)-101.2 F (38.4 C)] 99.9 F (37.7 C) (03/20 0400) Pulse Rate:  [68-99] 97 (03/20 0605) Resp:  [13-18] 18 (03/20 0605) BP: (97-202)/(67-120) 158/93 (03/20 0605) SpO2:  [93 %-100 %] 99 % (03/20 0605) FiO2 (%):  [40 %] 40 % (03/20 0339) Weight:  [70.9 kg]  70.9 kg (03/20 0500)  General - Well nourished, well developed, intubated on sedation.  Ophthalmologic - fundi not visualized due to noncooperation.  Cardiovascular - Regular rhythm but tachycardia.  Neuro - intubated on sedation, eyes closed but easily opens with voice and able to maintain opening, not following commands. Eye on left gaze position, doll's eyes present, not tracking, pupils 53mm and sluggish, blinking to visual threat on the left but not on the right, more consistent with vegetative state. Corneal reflex + bilaterally, gag and cough present. Breathing over the vent.  Facial symmetry not able to test due to ET tube.  Tongue protrusion not cooperative. No movement of BUE and BLE spontaneously, nor even with pain. DTR 1+ and toes equiv. Sensation, coordination and gait not tested.  ASSESSMENT/PLAN [name] is a 59 y.o. female with history of ICH 7 yrs ago, HTN found unresponsive. Found to have L sided hemiplegia, R gaze, SBP 250.  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 Moderate IVH. Mass effect w/ partial effacment R lateral and 3rd ventricles. 27mm L midline shift. Suspicious for early hydrocephalus.   Repeat CT head 3/16 interval L frontal lobe EVD w/ tip at R thalamus. Interval increased in R ICH now w/ 40mm L midline shift. IVH extended into B  lateral, 3rd and 4th ventricles. Prominent B superior ophthalmic vein suggestion increased ICP.   CT repeat 3/17 - stable MLS 39mm, hematoma and hydrocephalus  CT repeat 3/20 pending  2D Echo EF 60-65%.No arterial finding for bleed . Severe LVH. 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  EEG severe diffuse encephalopathy   LDL 63   HgbA1c 5.7  subq heparin for VTE prophylaxis discontinued due to bleed  No antithrombotic prior to admission, now on No antithrombotic given hemorrhage   Therapy recommendations:  Reorder when medically appropriate   Disposition:  pending   Cytotoxic Cerebral Edema Obstructive hydrocephalus Induced Hypernatremia  EVD placed 3/15 Ronnald Ramp)  CT 3/16 - hematoma mild enlargement  CT 3/17 - stable hematoma and MLS  CT 3/20 pending  On 3% saline  PICC placement  Na 134->139->155->146 ->154->155->151->154  Goal Na 150-155  Na q6h  Acute Respiratory Failure d/t ICH Probable Aspiration PNA  Intubated in ED  T-max 101.7->99.5->101.2->100.7   CCM on board  Pneumonia - on cefepime and Metronidazole  CXR 3/19 Mild atelectasis and or pleural fluid at the left base.  Likely need early trach - will discuss with CCM  Hypertensive Emergency  SBP > 250 on arrival  Home meds:  Coreg, HCTZ and losartan  On Cleviprex gtt, taper off as able . SBP goal <160 . Home losartan 50 and Coreg resumed, add amlodipine 10 . Long-term BP goal normotensive  Dysphagia . Secondary to stroke . NPO . Speech to see once exutbated . On trickle feeds 25I   Other Stroke Risk Factors  Obesity, Body mass index is 30.53 kg/m., recommend weight loss, diet and exercise as appropriate   Hx stroke/TIA  ICH 7 yrs ago. No data available in EPIC  Other Active Problems  Hypokalemia 3.2->5.6->4.1->3.0 -> 3.4->2.9 - supplement  Mild anemia - Hb - 11.3   Hospital day # 5  This patient is critically ill due to [place], IVH, hydrocephalus status post EVD, hypertensive emergency  and at significant risk of neurological worsening, death form hematoma expansion, obstructive hydrocephalus, cerebral edema, brain herniation.  This patient's care requires constant monitoring of vital signs, hemodynamics, respiratory and cardiac monitoring, review of multiple databases, neurological assessment, discussion with family, other specialists and medical decision making of high complexity. I spent 40 minutes of neurocritical care time in the care of this patient. I had long discussion with daughter at bedside, updated pt current condition, treatment plan and potential prognosis, and answered all the questions. She expressed understanding and appreciation.  Marvel Plan, MD PhD Stroke Neurology 07/23/2019 12:48 PM   To contact Stroke Continuity provider, please refer to [email or web address]. After hours, contact General Neurology

## 2019-07-23 NOTE — Progress Notes (Addendum)
Na 154 at 0733 this am, 139 at 1333.  Dr. Roda Shutters notified, verbal order to increase hypertonic saline from 25 mL/hr to 50 mL/hr.

## 2019-07-23 NOTE — Progress Notes (Signed)
Pt transported to CT via vent with no complications noted. Pt returned to 4N15, family in room.

## 2019-07-23 NOTE — Progress Notes (Signed)
Patient ID: Haley Hernandez, female   DOB: 04/30/1961, 59 y.o.   MRN: [id number] EVD functioning well continue with bloody drainage no change neurologically

## 2019-07-23 NOTE — Progress Notes (Signed)
NAME:  Haley Hernandez, MRN:  [id number], DOB:  03/10/1961, LOS: 5 ADMISSION DATE:  07/18/2019, CONSULTATION DATE:  07/19/2019 REFERRING MD:  Dr. Laurence Slate, CHIEF COMPLAINT:  ICH  Brief History   59 year old female with prior history ICH 7 yrs ago and HTN found unresponsive at home found to left hemiplegia and right gaze with SBP 250.  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  Intubated in ER for airway protection.  NSGY consulted and EVD placed.  Placed on cleviprex for blood pressure control and admitted to Neurology.  PCCM consulted for ventilator management.    Past Medical History  HTN OSA ICH (?2014)  Significant Hospital Events   3/15  Admitted  Consults:  NSGY PCCM  Procedures:  3/15 ETT >> 3/15 EVD >>  Significant Diagnostic Tests:  3/15 CTH >>  3.2 x 2.9 x 4.2 cm acute parenchymal hemorrhage with surrounding edema centered within the posterior right basal ganglia and right thalamus. Moderate volume intraventricular extension. Mass effect with partial effacement of the right lateral and third ventricles, and 4 mm leftward midline shift. Mild prominence of the lateral ventricles suspicious for early entrapment/hydrocephalus.  3/17 CT angio >> Size stable thalamic hematoma and midline shift (7 mm).  Stable EVD positioning and ventriculomegaly  Micro Data:  3/15 SARS2 / Flu A/B >> neg 3/15 MRSA PCR >> neg 3/17 resp cx :  Neg gm stain for wbc/org > no growth 3/20 BC x 2 >>> 3/20 Trach >>>    ID/ cultures/data   3/16 ceftriaxone >> 3/20 (spiked to 101.2)  3/16 Flagyl >> Maxepime 3/20 >>> PCT 3/20 >>>  Interim history/subjective:  Coughing off sedation      Objective   Blood pressure (!) 196/118, pulse (!) 104, temperature 99.9 F (37.7 C), temperature source Oral, resp. rate 17, height 5' (1.524 m), weight 70.9 kg, SpO2 100 %.    Vent Mode: PRVC FiO2 (%):  [40 %] 40 % Set Rate:  [18 bmp] 18  bmp Vt Set:  [360 mL] 360 mL PEEP:  [5 cmH20] 5 cmH20 Plateau Pressure:  [13 cmH20-14 cmH20] 13 cmH20   Intake/Output Summary (Last 24 hours) at 07/23/2019 0820 Last data filed at 07/23/2019 0800 Gross per 24 hour  Intake 1490.05 ml  Output 176 ml  Net 1314.05 ml   Filed Weights   07/18/19 2035 07/22/19 0500 07/23/19 0500  Weight: 75 kg 70.6 kg 70.9 kg   Physical Exam: Tmax  101.2  Sedated on vent / coughed and bp up with wua this am  Pt opens eyes to voice/ w/d both UEs not nox/ not fc No jvd Oropharynx et /og Neck supple Lungs clear bilaterally RRR no s3 or or sign murmur Abd soft /tf at 25 cc/hr= goal flexiseal for diarrhea  Extr warm / pas     CBC Latest Ref Rng & Units 07/23/2019 07/22/2019 07/21/2019  WBC 4.0 - 10.5 K/uL 7.9 9.1 8.1  Hemoglobin 12.0 - 15.0 g/dL 11.3(L) 11.8(L) 12.0  Hematocrit 36.0 - 46.0 % 35.1(L) 36.2 37.2  Platelets 150 - 400 K/uL 184 207 197   CMP Latest Ref Rng & Units 07/23/2019 07/23/2019 07/22/2019  Glucose 70 - 99 mg/dL 917(H) - -  BUN 6 - 20 mg/dL 15(A) - -  Creatinine 5.69 - 1.00 mg/dL 7.94 - -  Sodium 801 - 145 mmol/L 151(H) 166(HH) 168(HH)  Potassium 3.5 - 5.1 mmol/L 2.9(L) - -  Chloride 98 - 111  mmol/L 119(H) - -  CO2 22 - 32 mmol/L 23 - -  Calcium 8.9 - 10.3 mg/dL 9.9 - -  Total Protein 6.5 - 8.1 g/dL - - -  Total Bilirubin 0.3 - 1.2 mg/dL - - -  Alkaline Phos 38 - 126 U/L - - -  AST 15 - 41 U/L - - -  ALT 0 - 44 U/L - - -     Resolved Hospital Problem list    Assessment & Plan:   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 s/p EVD secondary to hypertensive crisis P:  neurosurgery managing, draining bloody CSF  3% saline . Currently off with na 151 am 3/20 am> neurology to restart   fentanyl IV drips  with goal RASS 0 to -1 and wean off propofol due to high triglycerides    Hypertensive crisis  Hx HTN, unclear if on home meds UDS neg - noted  to be cocaine positive 2009 P:   Tele monitoring Cleviprex for SBP goal < 160 Restarted carvedilol and losartan, home meds   Acute respiratory failure -Hx untreated OSA   >>> Full vent support/ no weaning given risk of elevated ICP   Prn albuterol  VAP bundle PPI   Aspiration pneumonitis (see micro flowsheet)  - new spike suggestive of hosp acq infection /pt already on flagyl >>> reculture/ check pct am 3/20 and change to max/flagyl ? Add vanc next   Hyperglycemia  -A1c 5.7 >> CBG q 4/ SSI sensitive  >> continue tube feeds  Hypokalemia/hypophosphatemia  >>> Kphos 30 mmol over 4 h am  3/20      Best practice:  Diet: TF Pain/Anxiety/Delirium protocol (if indicated): PAD protocol propofol/ prn fentanyl / RASS goal 0-/1 VAP protocol (if indicated): yes DVT prophylaxis: SCDs only  GI prophylaxis: PPI Glucose control: SSI sensitive Mobility: BR Code Status: Full  Family Communication: per neurology.  Disposition: Neuro ICU      The patient is critically ill with multiple organ systems failure and requires high complexity decision making for assessment and support, frequent evaluation and titration of therapies, application of advanced monitoring technologies and extensive interpretation of multiple databases. Critical Care Time devoted to patient care services described in this note is 45 minutes.

## 2019-07-24 LAB — SODIUM
Sodium: 150 mmol/L — ABNORMAL HIGH (ref 135–145)
Sodium: 155 mmol/L — ABNORMAL HIGH (ref 135–145)
Sodium: 155 mmol/L — ABNORMAL HIGH (ref 135–145)
Sodium: 148 mmol/L — ABNORMAL HIGH (ref 135–145)

## 2019-07-24 LAB — BASIC METABOLIC PANEL
Anion gap: 4 — ABNORMAL LOW (ref 5–15)
BUN: 15 mg/dL (ref 6–20)
CO2: 21 mmol/L — ABNORMAL LOW (ref 22–32)
Calcium: 8 mg/dL — ABNORMAL LOW (ref 8.9–10.3)
Chloride: 125 mmol/L — ABNORMAL HIGH (ref 98–111)
Creatinine, Ser: <0.3 mg/dL — ABNORMAL LOW (ref 0.44–1.00)
Glucose, Bld: 117 mg/dL — ABNORMAL HIGH (ref 70–99)
Potassium: 2.8 mmol/L — ABNORMAL LOW (ref 3.5–5.1)
Sodium: 150 mmol/L — ABNORMAL HIGH (ref 135–145)

## 2019-07-24 LAB — CBC
HCT: 30.5 % — ABNORMAL LOW (ref 36.0–46.0)
Hemoglobin: 11.4 g/dL — ABNORMAL LOW (ref 12.0–15.0)
MCH: 35.4 pg — ABNORMAL HIGH (ref 26.0–34.0)
MCHC: 37.4 g/dL — ABNORMAL HIGH (ref 30.0–36.0)
MCV: 94.7 fL (ref 80.0–100.0)
Platelets: 161 10*3/uL (ref 150–400)
RBC: 3.22 MIL/uL — ABNORMAL LOW (ref 3.87–5.11)
RDW: 15.6 % — ABNORMAL HIGH (ref 11.5–15.5)
WBC: 5.8 10*3/uL (ref 4.0–10.5)
nRBC: 0 % (ref 0.0–0.2)

## 2019-07-24 LAB — GLUCOSE, CAPILLARY
Glucose-Capillary: 116 mg/dL — ABNORMAL HIGH (ref 70–99)
Glucose-Capillary: 132 mg/dL — ABNORMAL HIGH (ref 70–99)
Glucose-Capillary: 147 mg/dL — ABNORMAL HIGH (ref 70–99)
Glucose-Capillary: 169 mg/dL — ABNORMAL HIGH (ref 70–99)
Glucose-Capillary: 170 mg/dL — ABNORMAL HIGH (ref 70–99)
Glucose-Capillary: 138 mg/dL — ABNORMAL HIGH (ref 70–99)

## 2019-07-24 LAB — PHOSPHORUS: Phosphorus: 1.9 mg/dL — ABNORMAL LOW (ref 2.5–4.6)

## 2019-07-24 LAB — MAGNESIUM: Magnesium: 2.1 mg/dL (ref 1.7–2.4)

## 2019-07-24 MED ORDER — LOSARTAN POTASSIUM 50 MG PO TABS: 100.0000 mg | ORAL_TABLET | Freq: Every day | ORAL | Status: DC

## 2019-07-24 MED ORDER — FAMOTIDINE 40 MG/5ML PO SUSR
20.0000 mg | Freq: Two times a day (BID) | ORAL | Status: DC
  Administered 2019-07-25 – 2019-08-12 (×37): 20 mg
  Filled 2019-07-24 (×38): qty 2.5

## 2019-07-24 MED ORDER — LABETALOL HCL 5 MG/ML IV SOLN
10.0000 mg | INTRAVENOUS | Status: DC | PRN
  Administered 2019-07-24 (×2): 20 mg via INTRAVENOUS
  Administered 2019-07-25 (×2): 40 mg via INTRAVENOUS
  Administered 2019-07-26 – 2019-07-27 (×4): 20 mg via INTRAVENOUS
  Administered 2019-07-28: 40 mg via INTRAVENOUS
  Administered 2019-07-28 (×3): 20 mg via INTRAVENOUS
  Administered 2019-07-28: 40 mg via INTRAVENOUS
  Administered 2019-07-28: 03:00:00 20 mg via INTRAVENOUS
  Administered 2019-07-28: 10 mg via INTRAVENOUS
  Administered 2019-07-29 (×6): 20 mg via INTRAVENOUS
  Filled 2019-07-24: qty 8
  Filled 2019-07-24 (×2): qty 4
  Filled 2019-07-24 (×5): qty 8
  Filled 2019-07-24: qty 4
  Filled 2019-07-24 (×3): qty 8
  Filled 2019-07-24 (×2): qty 4
  Filled 2019-07-24: qty 8

## 2019-07-24 MED ORDER — LOSARTAN POTASSIUM 50 MG PO TABS
50.0000 mg | ORAL_TABLET | Freq: Two times a day (BID) | ORAL | Status: DC
  Administered 2019-07-24 – 2019-08-05 (×24): 50 mg
  Filled 2019-07-24 (×24): qty 1

## 2019-07-24 MED ORDER — CARVEDILOL 12.5 MG PO TABS
25.0000 mg | ORAL_TABLET | Freq: Two times a day (BID) | ORAL | Status: DC
  Administered 2019-07-24 – 2019-08-16 (×46): 25 mg
  Filled 2019-07-24 (×3): qty 2
  Filled 2019-07-24: qty 4
  Filled 2019-07-24 (×42): qty 2

## 2019-07-24 MED ORDER — CLONIDINE HCL 0.1 MG PO TABS
0.1000 mg | ORAL_TABLET | Freq: Three times a day (TID) | ORAL | Status: DC
  Filled 2019-07-24: qty 1

## 2019-07-24 MED ORDER — POTASSIUM PHOSPHATES 15 MMOLE/5ML IV SOLN
30.0000 mmol | Freq: Once | INTRAVENOUS | Status: AC
  Administered 2019-07-24: 30 mmol via INTRAVENOUS
  Filled 2019-07-24: qty 10

## 2019-07-24 MED ORDER — POTASSIUM CHLORIDE 10 MEQ/100ML IV SOLN
10.0000 meq | INTRAVENOUS | Status: AC
  Administered 2019-07-24 (×4): 10 meq via INTRAVENOUS
  Filled 2019-07-24 (×4): qty 100

## 2019-07-24 MED ORDER — CLONIDINE HCL 0.1 MG PO TABS
0.1000 mg | ORAL_TABLET | Freq: Three times a day (TID) | ORAL | Status: DC
  Administered 2019-07-24 – 2019-07-25 (×3): 0.1 mg
  Filled 2019-07-24 (×2): qty 1

## 2019-07-24 NOTE — Progress Notes (Signed)
Patient ID: Haley Hernandez, female   DOB: November 02, 1960, 59 y.o.   MRN: [id number] Minimal response to noxious stimulation EVD continues to functioning well

## 2019-07-24 NOTE — Progress Notes (Signed)
STROKE TEAM PROGRESS NOTE   INTERVAL HISTORY RN is at bedside. Still intubated on vent. Eyes open but intermittently following simple commands on the right. EVD continues to drain. Na 155, will decrease 3% and prepare to taper off tomorrow. CT repeat yesterday stable hematoma and edema    Vitals:   07/24/19 0300 07/24/19 0335 07/24/19 0400 07/24/19 0500  BP: 129/74 (!) 148/85    Pulse: 90 (!) 104    Resp: 18 18    Temp:   99.4 F (37.4 C)   TempSrc:   Oral   SpO2: 95% 96%    Weight:    70.9 kg  Height:        CBC:  Recent Labs  Lab 07/18/19 2035 07/18/19 2047 07/22/19 0222 07/23/19 0500  WBC 8.8   < > 9.1 7.9  NEUTROABS 5.7  --   --   --   HGB 13.0   < > 11.8* 11.3*  HCT 40.4   < > 36.2 35.1*  MCV 87.4   < > 92.3 93.1  PLT 227   < > 207 184   < > = values in this interval not displayed.    Basic Metabolic Panel:  Recent Labs  Lab 07/22/19 0222 07/22/19 0800 07/23/19 0500 07/23/19 0731 07/23/19 1946 07/24/19 0207  NA 155*   < > 151*   < > 138 150*  K 3.4*  --  2.9*  --   --   --   CL 121*  --  119*  --   --   --   CO2 24  --  23  --   --   --   GLUCOSE 136*  --  125*  --   --   --   BUN 17  --  27*  --   --   --   CREATININE 0.65  --  0.68  --   --   --   CALCIUM 10.2  --  9.9  --   --   --   MG 1.9  --  1.9  --   --   --   PHOS 3.2  --  2.4*  --   --   --    < > = values in this interval not displayed.   Lipid Panel:     Component Value Date/Time   CHOL 180 07/20/2019 0556   TRIG 860 (H) 07/22/2019 0222   HDL 45 07/20/2019 0556   CHOLHDL 4.0 07/20/2019 0556   VLDL 72 (H) 07/20/2019 0556   LDLCALC 63 07/20/2019 0556   HgbA1c:  Lab Results  Component Value Date   HGBA1C 5.7 (H) 07/19/2019   Urine Drug Screen:     Component Value Date/Time   LABOPIA NONE DETECTED 07/19/2019 0220   COCAINSCRNUR NONE DETECTED 07/19/2019 0220   LABBENZ NONE DETECTED 07/19/2019 0220   AMPHETMU NONE DETECTED 07/19/2019 0220   THCU NONE DETECTED 07/19/2019 0220   LABBARB NONE DETECTED 07/19/2019 0220    Alcohol Level No results found for: ETH  IMAGING past 24 hours CT HEAD WO CONTRAST  Result Date: 07/23/2019 CLINICAL DATA:  Intracranial hemorrhage follow-up EXAM: CT HEAD WITHOUT CONTRAST TECHNIQUE: Contiguous axial images were obtained from the base of the skull through the vertex without intravenous contrast. COMPARISON:  07/19/2019 FINDINGS: Brain: Parenchymal hemorrhage centered within the right thalamus is again identified with similar size. Significant intraventricular extension is again noted. Associated edema and mass effect are similar with approximately 8 mm leftward  midline shift at the level of the third ventricle. Left frontal approach ventriculostomy catheter is in similar position. There is slightly increased hemorrhage along the catheter tract. Decreased size of the lateral ventricles. No new loss of gray-white differentiation. Vascular: No new findings. Skull: No new findings. Sinuses/Orbits: No acute abnormality. Other: None. IMPRESSION: Similar size of parenchymal hemorrhage centered within the right thalamus. Mass effect is similar. Decreased hydrocephalus. Ventriculostomy catheter is in similar position with slightly increased small volume hemorrhage along the catheter tract. Electronically Signed   By: Guadlupe Spanish M.D.   On: 07/23/2019 15:22    PHYSICAL EXAM  Temp:  [98.6 F (37 C)-100.7 F (38.2 C)] 99.4 F (37.4 C) (03/21 0400) Pulse Rate:  [75-110] 104 (03/21 0335) Resp:  [15-27] 18 (03/21 0335) BP: (127-202)/(74-118) 148/85 (03/21 0335) SpO2:  [95 %-100 %] 96 % (03/21 0335) FiO2 (%):  [40 %] 40 % (03/21 0335) Weight:  [70.9 kg] 70.9 kg (03/21 0500)  General - Well nourished, well developed, intubated on sedation.  Ophthalmologic - fundi not visualized due to noncooperation.  Cardiovascular - Regular rhythm but tachycardia.  Neuro - intubated on sedation, eyes spontaneously opens, seems to follow eye close open and  right hand and toe movement as requested. Eye more on left gaze position, doll's eyes present, not tracking, pupils 19mm and sluggish, blinking to visual threat on the left but not on the right. Corneal reflex + bilaterally, gag and cough present. Breathing over the vent.  Facial symmetry not able to test due to ET tube.  Tongue protrusion not cooperative. No movement of LUE and LLE even with pain. However, RUE bicep 3/5 and finger grip 3-/5 on command, RLE toe DF/PF 3/5 on request. DTR 1+ and toes equiv. Sensation, coordination and gait not tested.  ASSESSMENT/PLAN [name] is a 59 y.o. female with history of ICH 7 yrs ago, HTN found unresponsive. Found to have L sided hemiplegia, R gaze, SBP 250.  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 Moderate IVH. Mass effect w/ partial effacment R lateral and 3rd ventricles. 82mm L midline shift. Suspicious for early hydrocephalus.   Repeat CT head 3/16 interval L frontal lobe EVD w/ tip at R thalamus. Interval increased in R ICH now w/ 78mm L midline shift. IVH extended into B lateral, 3rd and 4th ventricles. Prominent B superior ophthalmic vein suggestion increased ICP.   CT repeat 3/17 - stable MLS 11mm, hematoma and hydrocephalus  CT repeat 3/20 - stable hematoma and edeam. Decreased hydrocephalus.   2D Echo EF 60-65%.No arterial finding for bleed . Severe LVH. 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  EEG severe diffuse encephalopathy   LDL 63   HgbA1c 5.7  subq heparin for VTE prophylaxis discontinued due to bleed  No antithrombotic prior to admission, now on No antithrombotic given hemorrhage   Therapy recommendations:  Reorder when medically appropriate   Disposition:  pending   Cytotoxic Cerebral Edema Obstructive hydrocephalus Induced Hypernatremia  EVD placed 3/15 Haley Hernandez)  CT 3/16 - hematoma mild  enlargement  CT 3/17 - stable hematoma and MLS  CT 3/20 - stable hematoma and edema with decreased hydrocephalus  On 3% saline down to 50cc, will start taper tomorrow  PICC placement  Na 134->139->155->146 ->154->155->151->154->155  Goal Na 150-155  Na q6h  Acute Respiratory Failure d/t ICH Probable Aspiration PNA  Intubated in ED  T-max 101.7->99.5->101.2->100.7->99.4   CCM on board  Sedation decreased, off propofol now  Pneumonia - (metronidazole - completed) cefepime started 07/23/19  CXR 3/19 Mild atelectasis and or pleural fluid at the left base.  Likely need early trach, family agrees - will discuss with CCM  Hypertensive Emergency  SBP > 250 on arrival  Home meds:  Coreg, HCTZ and losartan  On Cleviprex gtt, taper off as able . SBP goal <160 . On losartan 50->100 and Coreg 12.5->25 and amlodipine 10 . Long-term BP goal normotensive  Dysphagia . Secondary to stroke . NPO . Speech to see once exutbated . On trickle feeds 25   Other Stroke Risk Factors  Obesity, Body mass index is 30.53 kg/m., recommend weight loss, diet and exercise as appropriate   Hx stroke/TIA  ICH 7 yrs ago. No data available in EPIC  Other Active Problems  Hypokalemia 3.2->5.6->4.1->3.0 -> 3.4->2.9->2.8 - supplement   Mild anemia - Hb - 11.3 - 11.4   Hospital day # 6  This patient is critically ill due to [place], IVH, hydrocephalus status post EVD, hypertensive emergency and at significant risk of neurological worsening, death form hematoma expansion, obstructive hydrocephalus, cerebral edema, brain herniation. This patient's care requires constant monitoring of vital signs, hemodynamics, respiratory and cardiac monitoring, review of multiple databases, neurological assessment, discussion with family, other specialists and medical decision making of high complexity. I spent 35 minutes of neurocritical care time in the care of this patient.  Rosalin Hawking, MD PhD Stroke  Neurology 07/24/2019 11:28 AM   To contact Stroke Continuity provider, please refer to [email or web address] After hours, contact General Neurology

## 2019-07-24 NOTE — Progress Notes (Addendum)
NAME:  Haley Hernandez, MRN:  [id number], DOB:  20-Oct-1960, LOS: 6 ADMISSION DATE:  07/18/2019, CONSULTATION DATE:  07/19/2019 REFERRING MD:  Dr. Lorraine Lax, CHIEF COMPLAINT:  ICH  Brief History   59 year old female with prior history ICH 7 yrs ago and HTN found unresponsive at home found to left hemiplegia and right gaze with SBP 250.  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  Intubated in ER for airway protection.  NSGY consulted and EVD placed.  Placed on cleviprex for blood pressure control and admitted to Neurology.  PCCM consulted for ventilator management.    Past Medical History  HTN OSA ICH (?2014)  Significant Hospital Events   3/15  Admitted  Consults:  NSGY PCCM  Procedures:  3/15 ETT >> 3/15 EVD >>  Significant Diagnostic Tests:  3/15 CTH >>  3.2 x 2.9 x 4.2 cm acute parenchymal hemorrhage with surrounding edema centered within the posterior right basal ganglia and right thalamus. Moderate volume intraventricular extension. Mass effect with partial effacement of the right lateral and third ventricles, and 4 mm leftward midline shift. Mild prominence of the lateral ventricles suspicious for early entrapment/hydrocephalus.  3/17 CT head angio >> Size stable thalamic hematoma and midline shift (7 mm).  Stable EVD positioning and ventriculomegaly  3/20 CT heat w/o contrast Similar size of parenchymal hemorrhage centered within the right thalamus. Mass effect is similar.  Decreased hydrocephalus. Ventriculostomy catheter is in similar position with slightly increased small volume hemorrhage along the catheter tract.  Micro Data:  3/15 SARS2 / Flu A/B >> neg 3/15 MRSA PCR >> neg 3/17 resp cx :  Neg gm stain for wbc/org > no growth 3/20 BC x 2 >>> 3/20 Trach >>> rare wbc's/ no organisms >>>    ID/ cultures/data   3/16 ceftriaxone >> 3/20 (spiked to 101.2)  3/16 Flagyl >> Maxepime 3/20 >>>     Scheduled Meds: .  stroke: mapping our early stages of recovery book   Does not apply Once  . amLODipine  10 mg Per Tube Daily  . carvedilol  12.5 mg Per Tube BID  . chlorhexidine gluconate (MEDLINE KIT)  15 mL Mouth Rinse BID  . chlorhexidine gluconate (MEDLINE KIT)  15 mL Mouth Rinse BID  . Chlorhexidine Gluconate Cloth  6 each Topical Daily  . [START ON 07/25/2019] famotidine  20 mg Per Tube BID  . feeding supplement (PRO-STAT SUGAR FREE 64)  60 mL Per Tube BID  . feeding supplement (VITAL HIGH PROTEIN)  1,000 mL Per Tube Q24H  . fentaNYL (SUBLIMAZE) injection  50 mcg Intravenous Once  . influenza vac split quadrivalent PF  0.5 mL Intramuscular Tomorrow-1000  . insulin aspart  0-9 Units Subcutaneous Q4H  . losartan  50 mg Per Tube BID  . mouth rinse  15 mL Mouth Rinse 10 times per day  . pneumococcal 23 valent vaccine  0.5 mL Intramuscular Tomorrow-1000  . sodium chloride flush  10-40 mL Intracatheter Q12H   Continuous Infusions: . sodium chloride Stopped (07/24/19 0533)  . ceFEPime (MAXIPIME) IV 200 mL/hr at 07/24/19 0600  . clevidipine 19 mg/hr (07/24/19 0915)  . fentaNYL infusion INTRAVENOUS 100 mcg/hr (07/24/19 0600)  . potassium chloride 10 mEq (07/24/19 0939)  . potassium PHOSPHATE IVPB (in mmol)    . propofol (DIPRIVAN) infusion Stopped (07/23/19 1747)  . sodium chloride (hypertonic) 75 mL/hr at 07/24/19 0600   PRN Meds:.sodium chloride, acetaminophen **OR** acetaminophen (TYLENOL) oral liquid 160  mg/5 mL **OR** acetaminophen, albuterol, bisacodyl, fentaNYL, fentaNYL (SUBLIMAZE) injection, loperamide HCl, sodium chloride flush    Interim history/subjective:  Fentanyl on hold this am , some wiggling of R foot      Objective   Blood pressure (!) 164/97, pulse (!) 105, temperature 100.1 F (37.8 C), temperature source Axillary, resp. rate 18, height 5' (1.524 m), weight 70.9 kg, SpO2 99 %.    Vent Mode: PRVC FiO2 (%):  [40 %] 40 % Set Rate:  [18 bmp] 18 bmp Vt  Set:  [360 mL] 360 mL PEEP:  [5 cmH20] 5 cmH20 Plateau Pressure:  [13 cmH20-15 cmH20] 13 cmH20   Intake/Output Summary (Last 24 hours) at 07/24/2019 0959 Last data filed at 07/24/2019 0900 Gross per 24 hour  Intake 3930.57 ml  Output 4343 ml  Net -412.43 ml   Filed Weights   07/22/19 0500 07/23/19 0500 07/24/19 0500  Weight: 70.6 kg 70.9 kg 70.9 kg   Physical Exam: Tmax  100.7 trending down   Holding sedation this am but still not consistently f/c Oral ET/ft  Neck supple Lungs clear bilaterally  RRR no s3 abd soft/  Still having diarrhea Ext warm/ pas     CBC Latest Ref Rng & Units 07/24/2019 07/23/2019 07/22/2019  WBC 4.0 - 10.5 K/uL 5.8 7.9 9.1  Hemoglobin 12.0 - 15.0 g/dL 11.4(L) 11.3(L) 11.8(L)  Hematocrit 36.0 - 46.0 % 30.5(L) 35.1(L) 36.2  Platelets 150 - 400 K/uL 161 184 207   CMP Latest Ref Rng & Units 07/24/2019 07/24/2019 07/24/2019  Glucose 70 - 99 mg/dL - 117(H) -  BUN 6 - 20 mg/dL - 15 -  Creatinine 0.44 - 1.00 mg/dL - <0.30(L) -  Sodium 135 - 145 mmol/L 155(H) 150(H) 150(H)  Potassium 3.5 - 5.1 mmol/L - 2.8(L) -  Chloride 98 - 111 mmol/L - 125(H) -  CO2 22 - 32 mmol/L - 21(L) -  Calcium 8.9 - 10.3 mg/dL - 8.0(L) -  Total Protein 6.5 - 8.1 g/dL - - -  Total Bilirubin 0.3 - 1.2 mg/dL - - -  Alkaline Phos 38 - 126 U/L - - -  AST 15 - 41 U/L - - -  ALT 0 - 44 U/L - - -     Resolved Hospital Problem list    Assessment & Plan:   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 s/p EVD secondary to hypertensive crisis P:  neurosurgery managing, draining bloody CSF  3% saline  Raised Na to 155 > neuro managing per pharmacy   fentanyl IV drip prn, avoiding propofol with hight triglycerides     Hypertensive crisis  Hx HTN, unclear if on home meds UDS neg - 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   Acute respiratory failure -Hx untreated OSA   >>> Full vent support/ no weaning given risk of elevated ICP  Prn albuterol  VAP bundle PPI   Aspiration pneumonitis (see micro flowsheet)  - new spike suggestive of hosp acq infection /pt already on flagyl with diarrhea >>> see micro flowsheet/ check c diff if/when meets criteria   Hyperglycemia  -A1c 5.7 >> CBG q 4/ SSI sensitive  >> continue tube feeds  Hypokalemia/hypophosphatemia  >>> Kphos 30 mmol  over 4 h am  3/21 per pharmacy plus 40 meq KCL       Best practice:  Diet: TF Pain/Anxiety/Delirium protocol (if indicated): fentanyl drip prn  VAP protocol (if indicated): yes DVT prophylaxis: SCDs only  GI prophylaxis: changed to pepcid due to diarrhea  Glucose control: SSI sensitive Mobility: BR Code Status: Full  Family Communication: per neurology.  Disposition: Neuro ICU    The patient is critically ill with multiple organ systems failure and requires high complexity decision making for assessment and support, frequent evaluation and titration of therapies, application of advanced monitoring technologies and extensive interpretation of multiple databases. Critical Care Time devoted to patient care services described in this note is 35 minutes.    Christinia Gully, MD Pulmonary and [place] [phone] After 6:00 PM or weekends, use Beeper [phone]  After 7:00 pm call Elink  [phone]

## 2019-07-25 ENCOUNTER — Encounter (HOSPITAL_COMMUNITY): Payer: Self-pay | Admitting: Neurology

## 2019-07-25 ENCOUNTER — Other Ambulatory Visit: Payer: Self-pay

## 2019-07-25 LAB — GLUCOSE, CAPILLARY
Glucose-Capillary: 130 mg/dL — ABNORMAL HIGH (ref 70–99)
Glucose-Capillary: 123 mg/dL — ABNORMAL HIGH (ref 70–99)
Glucose-Capillary: 155 mg/dL — ABNORMAL HIGH (ref 70–99)
Glucose-Capillary: 108 mg/dL — ABNORMAL HIGH (ref 70–99)
Glucose-Capillary: 125 mg/dL — ABNORMAL HIGH (ref 70–99)
Glucose-Capillary: 140 mg/dL — ABNORMAL HIGH (ref 70–99)

## 2019-07-25 LAB — CULTURE, RESPIRATORY W GRAM STAIN
Culture: NO GROWTH
Special Requests: NORMAL

## 2019-07-25 LAB — PROCALCITONIN: Procalcitonin: <0.1 ng/mL

## 2019-07-25 LAB — BASIC METABOLIC PANEL
Anion gap: 5 (ref 5–15)
BUN: 20 mg/dL (ref 6–20)
CO2: 26 mmol/L (ref 22–32)
Calcium: 9.9 mg/dL (ref 8.9–10.3)
Chloride: 127 mmol/L — ABNORMAL HIGH (ref 98–111)
Creatinine, Ser: 0.57 mg/dL (ref 0.44–1.00)
GFR calc Af Amer: >60 mL/min (ref >60)
GFR calc non Af Amer: >60 mL/min (ref >60)
Glucose, Bld: 148 mg/dL — ABNORMAL HIGH (ref 70–99)
Potassium: 3.1 mmol/L — ABNORMAL LOW (ref 3.5–5.1)
Sodium: 158 mmol/L — ABNORMAL HIGH (ref 135–145)

## 2019-07-25 LAB — TRIGLYCERIDES: Triglycerides: 488 mg/dL — ABNORMAL HIGH (ref <150)

## 2019-07-25 LAB — SODIUM
Sodium: 159 mmol/L — ABNORMAL HIGH (ref 135–145)
Sodium: 158 mmol/L — ABNORMAL HIGH (ref 135–145)
Sodium: 156 mmol/L — ABNORMAL HIGH (ref 135–145)

## 2019-07-25 LAB — PHOSPHORUS: Phosphorus: 2.6 mg/dL (ref 2.5–4.6)

## 2019-07-25 LAB — MAGNESIUM: Magnesium: 1.7 mg/dL (ref 1.7–2.4)

## 2019-07-25 MED ORDER — VITAL AF 1.2 CAL PO LIQD
1000.0000 mL | ORAL | Status: DC
  Administered 2019-07-26 – 2019-07-31 (×6): 1000 mL

## 2019-07-25 MED ORDER — SODIUM CHLORIDE 0.9 % IV SOLN: INTRAVENOUS | Status: DC | PRN

## 2019-07-25 MED ORDER — CLONIDINE HCL 0.2 MG PO TABS
0.2000 mg | ORAL_TABLET | Freq: Three times a day (TID) | ORAL | Status: DC
  Administered 2019-07-25 – 2019-07-29 (×14): 0.2 mg
  Filled 2019-07-25 (×14): qty 1

## 2019-07-25 MED ORDER — PRO-STAT SUGAR FREE PO LIQD
30.0000 mL | Freq: Every day | ORAL | Status: DC
  Administered 2019-07-26 – 2019-08-03 (×8): 30 mL
  Filled 2019-07-25 (×8): qty 30

## 2019-07-25 MED ORDER — SODIUM CHLORIDE 0.9 % IV SOLN: INTRAVENOUS | Status: DC

## 2019-07-25 NOTE — Progress Notes (Signed)
Nutrition Follow-up  DOCUMENTATION CODES:   Obesity unspecified  INTERVENTION:    D/C Vital High Protein  Vital AF 1.2 @ 45 ml/hr (1080 ml/day) via OG tube 30 ml Prostat daily  Provides: 1396 kcal, 96 grams protein, and 875 ml free water.   NUTRITION DIAGNOSIS:   Inadequate oral intake related to inability to eat as evidenced by NPO status.  GOAL:   Provide needs based on ASPEN/SCCM guidelines  MONITOR:   TF tolerance, Vent status, I & O's  REASON FOR ASSESSMENT:   Ventilator    ASSESSMENT:   Pt with PMH of HTN, OSA, and prior ICH admitted from home with acute intraparenchymal hemorrhage in R thalamus and basal ganglia with IVH extension s/p EVD.   Pt discussed during ICU rounds and with RN.  Per RN propofol off and weaning cleviprex off today. Will adjust TF.   Patient is currently intubated on ventilator support MV: 7.3 L/min Temp (24hrs), Avg:99 F (37.2 C), Min:98.6 F (37 C), Max:99.3 F (37.4 C)  Medications reviewed  Labs reviewed: Na 158 (H), K+ 3.1 (L), TG: 488 (H) Drain: 192   TF: Vital High Protein @ 25 ml/hr with 60 ml Prostat BID Provides: 1000 kcal, 112 grams protein, and 501 ml free water.    Diet Order:   Diet Order            Diet NPO time specified  Diet effective now              EDUCATION NEEDS:   No education needs have been identified at this time  Skin:  Skin Assessment: Reviewed RN Assessment  Last BM:  400 ml via rectal tube  Height:   Ht Readings from Last 1 Encounters:  07/25/19 5' (1.524 m)    Weight:   Wt Readings from Last 1 Encounters:  07/25/19 71 kg    Ideal Body Weight:  45.4 kg  BMI:  Body mass index is 30.57 kg/m.  Estimated Nutritional Needs:   Kcal:  1427  Protein:  90-105 grams  Fluid:  >1.5 L/day  Cammy Copa., RD, LDN, CNSC See AMiON for contact information

## 2019-07-25 NOTE — Progress Notes (Signed)
SLP Cancellation Note  Patient Details Name: Haley Hernandez MRN: [id number] DOB: Dec 11, 1960   Cancelled treatment:       Reason Eval/Treat Not Completed: Patient not medically ready - remains on vent, no weaning yet per PCCM note. Will sign off for speech-language eval now. Please reorder when ready.    Mahala Menghini., M.A. CCC-SLP Acute Rehabilitation Services Pager [phone] Office [phone]  07/25/2019, 7:05 AM

## 2019-07-25 NOTE — Progress Notes (Signed)
Patient ID: Haley Hernandez, female   DOB: [birth year], 59 y.o.   MRN: [id number] She looks a little better clinically with her eyes open.  CT scan shows no change in the intercerebral hemorrhage but does show some clearing of the blood in the third and fourth ventricles.  We will raise ventriculostomy drain to 20 cm of water pressure to hopefully start to wean

## 2019-07-25 NOTE — Progress Notes (Signed)
STROKE TEAM PROGRESS NOTE   INTERVAL HISTORY Daughter at bedside. Pt was still intubated, eyes open on voice and maintains open. However, did not following commands today comparing to yesterday. Still on cleviprex, will increase clonidine. Na 159, stop 3% saline.    Vitals:   07/25/19 0700 07/25/19 0740 07/25/19 0800 07/25/19 0900  BP: 127/82  (!) 144/89 (!) 143/86  Pulse: 78  96 84  Resp: 18  18 18   Temp:   98.9 F (37.2 C)   TempSrc:   Axillary   SpO2: 94% 100% 94% 91%  Weight:      Height:       CBC:  Recent Labs  Lab 07/18/19 2035 07/18/19 2047 07/23/19 0500 07/24/19 0500  WBC 8.8   < > 7.9 5.8  NEUTROABS 5.7  --   --   --   HGB 13.0   < > 11.3* 11.4*  HCT 40.4   < > 35.1* 30.5*  MCV 87.4   < > 93.1 94.7  PLT 227   < > 184 161   < > = values in this interval not displayed.   Basic Metabolic Panel:  Recent Labs  Lab 07/23/19 0500 07/23/19 0731 07/24/19 0500 07/24/19 0835 07/24/19 1951 07/25/19 0212  NA 151*   < > 150*   < > 148* 159*  K 2.9*  --  2.8*  --   --   --   CL 119*  --  125*  --   --   --   CO2 23  --  21*  --   --   --   GLUCOSE 125*  --  117*  --   --   --   BUN 27*  --  15  --   --   --   CREATININE 0.68  --  <0.30*  --   --   --   CALCIUM 9.9  --  8.0*  --   --   --   MG 1.9   < > 2.1  --   --  1.7  PHOS 2.4*   < > 1.9*  --   --  2.6   < > = values in this interval not displayed.   Lipid Panel:     Component Value Date/Time   CHOL 180 07/20/2019 0556   TRIG 488 (H) 07/25/2019 0213   HDL 45 07/20/2019 0556   CHOLHDL 4.0 07/20/2019 0556   VLDL 72 (H) 07/20/2019 0556   LDLCALC 63 07/20/2019 0556   HgbA1c:  Lab Results  Component Value Date   HGBA1C 5.7 (H) 07/19/2019   Urine Drug Screen:     Component Value Date/Time   LABOPIA NONE DETECTED 07/19/2019 0220   COCAINSCRNUR NONE DETECTED 07/19/2019 0220   LABBENZ NONE DETECTED 07/19/2019 0220   AMPHETMU NONE DETECTED 07/19/2019 0220   THCU NONE DETECTED 07/19/2019 0220   LABBARB  NONE DETECTED 07/19/2019 0220   IMAGING past 24 hours No results found.  PHYSICAL EXAM    Temp:  [98.6 F (37 C)-100.1 F (37.8 C)] 98.9 F (37.2 C) (03/22 0800) Pulse Rate:  [77-109] 84 (03/22 0900) Resp:  [14-24] 18 (03/22 0900) BP: (116-180)/(70-114) 143/86 (03/22 0900) SpO2:  [91 %-100 %] 91 % (03/22 0900) FiO2 (%):  [30 %-40 %] 30 % (03/22 0740) Weight:  [71 kg] 71 kg (03/22 0436)  General - Well nourished, well developed, intubated on sedation.  Ophthalmologic - fundi not visualized due to noncooperation.  Cardiovascular - Regular  rhythm but tachycardia.  Neuro - intubated on sedation, eyes spontaneously opens, seems to follow eye close open and right hand and toe movement as requested. Eye more on left gaze position, doll's eyes present, not tracking, pupils 12mm and sluggish, blinking to visual threat on the left but not on the right. Corneal reflex + bilaterally, gag and cough present. Breathing over the vent.  Facial symmetry not able to test due to ET tube.  Tongue protrusion not cooperative. No movement of LUE and LLE even with pain. However, RUE bicep 3/5 and finger grip 3-/5 on command, RLE toe DF/PF 3/5 on request. DTR 1+ and toes equiv. Sensation, coordination and gait not tested.  ASSESSMENT/PLAN Ms. Canyon Willow is a 59 y.o. female with history of ICH 7 yrs ago, HTN found unresponsive. Found to have L sided hemiplegia, R gaze, SBP 250.  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 Moderate IVH. Mass effect w/ partial effacment R lateral and 3rd ventricles. 3mm L midline shift. Suspicious for early hydrocephalus.   Repeat CT head 3/16 interval L frontal lobe EVD w/ tip at R thalamus. Interval increased in R ICH now w/ 14mm L midline shift. IVH extended into B lateral, 3rd and 4th ventricles. Prominent B superior ophthalmic vein  suggestion increased ICP.   CT repeat 3/17 - stable MLS 35mm, hematoma and hydrocephalus  CT repeat 3/20 - stable hematoma and edeam. Decreased hydrocephalus.   2D Echo EF 60-65%.No arterial finding for bleed . Severe LVH. 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  EEG severe diffuse encephalopathy   LDL 63   HgbA1c 5.7  SCDs for VTE prophylaxis   No antithrombotic prior to admission, now on No antithrombotic given hemorrhage   Therapy recommendations:  Reorder when medically appropriate   Disposition:  pending   Cytotoxic Cerebral Edema Obstructive hydrocephalus Induced Hypernatremia  EVD placed 3/15 [name])  CT 3/16 - hematoma mild enlargement  CT 3/17 - stable hematoma and MLS  CT 3/20 - stable hematoma and edema with decreased hydrocephalus  Off 3% now, will allow slow drift down of Na  PICC placement  Na 134->139->155->146 ->154->155->151->154->155->148->159  Na q 6h  Acute Respiratory Failure d/t ICH Probable Aspiration PNA  Intubated in ED  T-max 101.7->99.5->101.2->100.7->99.4   CCM on board  Sedation, off propofol, on fentanyl  Pneumonia - (ceftriaxone and metronidazole 3/16>>3/20) cefepime started 07/23/19>>  CXR 3/19 Mild atelectasis and or pleural fluid at the left base.  Likely need early trach, family agrees - will discuss with CCM  Hypertensive Emergency  SBP > 250 on arrival  Home meds:  Coreg, HCTZ and losartan  On Cleviprex gtt, taper off as able . SBP goal <160 . On losartan 50 bid and Coreg 25 bid and amlodipine 10 and catapres 0.1->0.2 tid . Long-term BP goal normotensive  Dysphagia . Secondary to stroke . NPO . On trickle feeds 25 . Put on IVF @ 40  Speech to see once exutbated   Other Stroke Risk Factors  Obesity, Body mass index is 30.57 kg/m., recommend weight loss, diet and exercise as appropriate   Hx stroke/TIA  ICH 7 yrs ago. No data available in EPIC  Other Active Problems  Hypokalemia 3.2->5.6->4.1->3.0 ->  3.4->2.9->2.8 - supplement   Mild anemia - Hb - 11.3 - 11.4   Hospital day # 7  This patient is critically ill due to  ICH, IVH, hydrocephalus status post EVD, hypertensive emergency and at significant risk of neurological worsening, death form hematoma expansion, obstructive hydrocephalus, cerebral edema, brain herniation. This patient's care requires constant monitoring of vital signs, hemodynamics, respiratory and cardiac monitoring, review of multiple databases, neurological assessment, discussion with family, other specialists and medical decision making of high complexity. I spent 35 minutes of neurocritical care time in the care of this patient.   Marvel Plan, MD PhD Stroke Neurology 07/25/2019 10:16 AM   To contact Stroke Continuity provider, please refer to [email or web address]. After hours, contact General Neurology

## 2019-07-25 NOTE — Progress Notes (Signed)
NAME:  Haley Hernandez, MRN:  [id number], DOB:  1961/04/10, LOS: 7 ADMISSION DATE:  07/18/2019, CONSULTATION DATE:  07/19/2019 REFERRING MD:  Dr. Lorraine Lax, CHIEF COMPLAINT:  ICH  Brief History   59 year old female with prior history ICH 7 yrs PTA and HTN found unresponsive at home found to left hemiplegia and right gaze with SBP 250.  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  Intubated in ER for airway protection.  NSGY consulted and EVD placed.  Placed on cleviprex for blood pressure control and admitted to Neurology.  PCCM consulted for ventilator management.    Past Medical History  HTN OSA ICH (?2014)  Significant Hospital Events   3/15  Admitted  Consults:  NSGY PCCM  Procedures:  3/15 ETT >> 3/15 EVD >>  Significant Diagnostic Tests:  3/15 CTH >>  3.2 x 2.9 x 4.2 cm acute parenchymal hemorrhage with surrounding edema centered within the posterior right basal ganglia and right thalamus. Moderate volume intraventricular extension. Mass effect with partial effacement of the right lateral and third ventricles, and 4 mm leftward midline shift. Mild prominence of the lateral ventricles suspicious for early entrapment/hydrocephalus.  3/17 CT head angio >> Size stable thalamic hematoma and midline shift (7 mm).  Stable EVD positioning and ventriculomegaly  3/20 CT heat w/o contrast Similar size of parenchymal hemorrhage centered within the right thalamus. Mass effect is similar.  Decreased hydrocephalus. Ventriculostomy catheter is in similar position with slightly increased small volume hemorrhage along the catheter tract.  Micro Data:  3/15 SARS2 / Flu A/B >> neg 3/15 MRSA PCR >> neg 3/17 resp cx :  Neg gm stain for wbc/org > no growth 3/20 BC x 2 >>> 3/20 Trach >>> rare wbc's/ no organisms >>>    ID/ cultures/data   3/16 ceftriaxone >> 3/20 (spiked to 101.2)  Flagyl 3/16  >>  3/20  Maxepime 3/20 >>>      Scheduled Meds: .  stroke: mapping our early stages of recovery book   Does not apply Once  . amLODipine  10 mg Per Tube Daily  . carvedilol  25 mg Per Tube BID  . chlorhexidine gluconate (MEDLINE KIT)  15 mL Mouth Rinse BID  . chlorhexidine gluconate (MEDLINE KIT)  15 mL Mouth Rinse BID  . Chlorhexidine Gluconate Cloth  6 each Topical Daily  . cloNIDine  0.1 mg Per Tube TID  . famotidine  20 mg Per Tube BID  . feeding supplement (PRO-STAT SUGAR FREE 64)  60 mL Per Tube BID  . feeding supplement (VITAL HIGH PROTEIN)  1,000 mL Per Tube Q24H  . fentaNYL (SUBLIMAZE) injection  50 mcg Intravenous Once  . influenza vac split quadrivalent PF  0.5 mL Intramuscular Tomorrow-1000  . insulin aspart  0-9 Units Subcutaneous Q4H  . losartan  50 mg Per Tube BID  . mouth rinse  15 mL Mouth Rinse 10 times per day  . pneumococcal 23 valent vaccine  0.5 mL Intramuscular Tomorrow-1000  . sodium chloride flush  10-40 mL Intracatheter Q12H   Continuous Infusions: . sodium chloride 10 mL/hr at 07/25/19 0900  . ceFEPime (MAXIPIME) IV Stopped (07/25/19 0533)  . clevidipine 13 mg/hr (07/25/19 0900)  . fentaNYL infusion INTRAVENOUS 50 mcg/hr (07/25/19 0900)  . sodium chloride (hypertonic) 50 mL/hr at 07/25/19 0900   PRN Meds:.sodium chloride, acetaminophen **OR** acetaminophen (TYLENOL) oral liquid 160 mg/5 mL **OR** acetaminophen, albuterol, bisacodyl, fentaNYL, fentaNYL (SUBLIMAZE) injection, labetalol, loperamide HCl, sodium  chloride flush    Interim history/subjective:  Wiggles R foot and hand / still on low dose fentanyl being weaned      Objective   Blood pressure (!) 144/89, pulse 96, temperature 98.9 F (37.2 C), temperature source Axillary, resp. rate 18, height 5' (1.524 m), weight 71 kg, SpO2 94 %.    Vent Mode: PRVC FiO2 (%):  [30 %-40 %] 30 % Set Rate:  [18 bmp] 18 bmp Vt Set:  [360 mL] 360 mL PEEP:  [5 cmH20] 5 cmH20 Plateau Pressure:  [12 cmH20-15 cmH20] 15 cmH20   Intake/Output  Summary (Last 24 hours) at 07/25/2019 1610 Last data filed at 07/25/2019 0900 Gross per 24 hour  Intake 4040.09 ml  Output 2987 ml  Net 1053.09 ml   Filed Weights   07/23/19 0500 07/24/19 0500 07/25/19 0436  Weight: 70.9 kg 70.9 kg 71 kg   Physical Exam: Tmax   100.1 still trening down slowly  F/c despite fentanyl drip still  Oral et/ ft Neck supple Lungs clear  RRR no s2 abd soft/ still having diarrhea Ext warm/ PAS hose     CBC Latest Ref Rng & Units 07/24/2019 07/23/2019 07/22/2019  WBC 4.0 - 10.5 K/uL 5.8 7.9 9.1  Hemoglobin 12.0 - 15.0 g/dL 11.4(L) 11.3(L) 11.8(L)  Hematocrit 36.0 - 46.0 % 30.5(L) 35.1(L) 36.2  Platelets 150 - 400 K/uL 161 184 207   CMP Latest Ref Rng & Units 07/25/2019 07/24/2019 07/24/2019  Glucose 70 - 99 mg/dL - - -  BUN 6 - 20 mg/dL - - -  Creatinine 0.44 - 1.00 mg/dL - - -  Sodium 135 - 145 mmol/L 159(H) 148(H) 155(H)  Potassium 3.5 - 5.1 mmol/L - - -  Chloride 98 - 111 mmol/L - - -  CO2 22 - 32 mmol/L - - -  Calcium 8.9 - 10.3 mg/dL - - -  Total Protein 6.5 - 8.1 g/dL - - -  Total Bilirubin 0.3 - 1.2 mg/dL - - -  Alkaline Phos 38 - 126 U/L - - -  AST 15 - 41 U/L - - -  ALT 0 - 44 U/L - - -     Resolved Hospital Problem list    Assessment & Plan:   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 s/p EVD secondary to hypertensive crisis P:  neurosurgery managing, draining bloody CSF  3% saline  Raised Na to 159 am 3/22  > neuro managing per pharmacy   fentanyl IV drip prn, avoiding propofol with hight triglycerides     Hypertensive crisis  Hx HTN, unclear if on home meds UDS neg - 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   Acute respiratory failure -Hx  untreated OSA   >>> ok to try weaning to psv daytime if able to come off fentanyl s bp spike Prn albuterol  VAP bundle PPI   Aspiration pneumonitis (see micro flowsheet)  - new spike suggestive of hosp acq infection /pt already on flagyl with diarrhea >>> stopped roc/flagyl per flowsheet/ added maxipime for SPACE orgs (hosp acquired gnrs)   Hyperglycemia  -A1c 5.7 >> CBG q 4/ SSI sensitive  >> continue tube feeds  Hypokalemia/hypophosphatemia  >>>  Repeat K pending am 3/22/  phos ok        Best practice:  Diet: TF Pain/Anxiety/Delirium protocol (if indicated): fentanyl drip prn  VAP protocol (if indicated): yes DVT prophylaxis: SCDs only  GI prophylaxis: changed to pepcid due to diarrhea  Glucose control: SSI sensitive Mobility: BR Code Status: Full  Family Communication: per neurology.  Disposition: Neuro ICU    The patient is critically ill with multiple organ systems failure and requires high complexity decision making for assessment and support, frequent evaluation and titration of therapies, application of advanced monitoring technologies and extensive interpretation of multiple databases. Critical Care Time devoted to patient care services described in this note is 40 minutes.    Christinia Gully, MD Pulmonary and [place] [phone] After 6:00 PM or weekends, use Beeper [phone]  After 7:00 pm call Elink  [phone]

## 2019-07-26 ENCOUNTER — Inpatient Hospital Stay (HOSPITAL_COMMUNITY): Payer: 59

## 2019-07-26 DIAGNOSIS — Z978 Presence of other specified devices: Secondary | ICD-10-CM

## 2019-07-26 LAB — BASIC METABOLIC PANEL
Anion gap: 6 (ref 5–15)
Anion gap: 8 (ref 5–15)
Anion gap: 4 — ABNORMAL LOW (ref 5–15)
BUN: 23 mg/dL — ABNORMAL HIGH (ref 6–20)
BUN: 30 mg/dL — ABNORMAL HIGH (ref 6–20)
BUN: 30 mg/dL — ABNORMAL HIGH (ref 6–20)
CO2: 25 mmol/L (ref 22–32)
CO2: 26 mmol/L (ref 22–32)
CO2: 26 mmol/L (ref 22–32)
Calcium: 9.6 mg/dL (ref 8.9–10.3)
Calcium: 10.3 mg/dL (ref 8.9–10.3)
Calcium: 10.1 mg/dL (ref 8.9–10.3)
Chloride: 120 mmol/L — ABNORMAL HIGH (ref 98–111)
Chloride: 121 mmol/L — ABNORMAL HIGH (ref 98–111)
Chloride: 125 mmol/L — ABNORMAL HIGH (ref 98–111)
Creatinine, Ser: 0.47 mg/dL (ref 0.44–1.00)
Creatinine, Ser: 0.79 mg/dL (ref 0.44–1.00)
Creatinine, Ser: 0.7 mg/dL (ref 0.44–1.00)
GFR calc Af Amer: >60 mL/min (ref >60)
GFR calc Af Amer: >60 mL/min (ref >60)
GFR calc Af Amer: >60 mL/min (ref >60)
GFR calc non Af Amer: >60 mL/min (ref >60)
GFR calc non Af Amer: >60 mL/min (ref >60)
GFR calc non Af Amer: >60 mL/min (ref >60)
Glucose, Bld: 144 mg/dL — ABNORMAL HIGH (ref 70–99)
Glucose, Bld: 161 mg/dL — ABNORMAL HIGH (ref 70–99)
Glucose, Bld: 162 mg/dL — ABNORMAL HIGH (ref 70–99)
Potassium: 2.7 mmol/L — CL (ref 3.5–5.1)
Potassium: 3.8 mmol/L (ref 3.5–5.1)
Potassium: 3.9 mmol/L (ref 3.5–5.1)
Sodium: 151 mmol/L — ABNORMAL HIGH (ref 135–145)
Sodium: 155 mmol/L — ABNORMAL HIGH (ref 135–145)
Sodium: 155 mmol/L — ABNORMAL HIGH (ref 135–145)

## 2019-07-26 LAB — GLUCOSE, CAPILLARY
Glucose-Capillary: 142 mg/dL — ABNORMAL HIGH (ref 70–99)
Glucose-Capillary: 151 mg/dL — ABNORMAL HIGH (ref 70–99)
Glucose-Capillary: 189 mg/dL — ABNORMAL HIGH (ref 70–99)
Glucose-Capillary: 155 mg/dL — ABNORMAL HIGH (ref 70–99)
Glucose-Capillary: 156 mg/dL — ABNORMAL HIGH (ref 70–99)
Glucose-Capillary: 146 mg/dL — ABNORMAL HIGH (ref 70–99)

## 2019-07-26 LAB — CBC
HCT: 32.4 % — ABNORMAL LOW (ref 36.0–46.0)
Hemoglobin: 10.2 g/dL — ABNORMAL LOW (ref 12.0–15.0)
MCH: 29.8 pg (ref 26.0–34.0)
MCHC: 31.5 g/dL (ref 30.0–36.0)
MCV: 94.7 fL (ref 80.0–100.0)
Platelets: 162 10*3/uL (ref 150–400)
RBC: 3.42 MIL/uL — ABNORMAL LOW (ref 3.87–5.11)
RDW: 15.6 % — ABNORMAL HIGH (ref 11.5–15.5)
WBC: 7.7 10*3/uL (ref 4.0–10.5)
nRBC: 0 % (ref 0.0–0.2)

## 2019-07-26 LAB — SODIUM
Sodium: 154 mmol/L — ABNORMAL HIGH (ref 135–145)
Sodium: 152 mmol/L — ABNORMAL HIGH (ref 135–145)

## 2019-07-26 LAB — MAGNESIUM: Magnesium: 2 mg/dL (ref 1.7–2.4)

## 2019-07-26 MED ORDER — POTASSIUM CHLORIDE 20 MEQ/15ML (10%) PO SOLN
40.0000 meq | ORAL | Status: AC
  Administered 2019-07-26 (×2): 40 meq
  Filled 2019-07-26 (×2): qty 30

## 2019-07-26 MED ORDER — POTASSIUM CHLORIDE 20 MEQ/15ML (10%) PO SOLN
40.0000 meq | Freq: Once | ORAL | Status: AC
  Administered 2019-07-26: 40 meq

## 2019-07-26 MED ORDER — HEPARIN SODIUM (PORCINE) 5000 UNIT/ML IJ SOLN
5000.0000 [IU] | Freq: Three times a day (TID) | INTRAMUSCULAR | Status: DC
  Administered 2019-07-26 – 2019-07-30 (×12): 5000 [IU] via SUBCUTANEOUS
  Filled 2019-07-26 (×12): qty 1

## 2019-07-26 NOTE — Progress Notes (Signed)
STROKE TEAM PROGRESS NOTE   INTERVAL HISTORY RN at bedside. Pt initially eyes closed but easily open with voice. She was put on weaning this morning but not back to vent. EVD at [facility] yesterday and today will do the clamp by NSG. Otherwise neuro stable.   Vitals:   07/26/19 0733 07/26/19 0741 07/26/19 0753 07/26/19 0800  BP: (!) 169/99 (!) 141/94 132/85 (!) 145/95  Pulse:    88  Resp:    (!) 22  Temp:    (!) 100.5 F (38.1 C)  TempSrc:    Axillary  SpO2:    91%  Weight:      Height:       CBC:  Recent Labs  Lab 07/24/19 0500 07/26/19 0410  WBC 5.8 7.7  HGB 11.4* 10.2*  HCT 30.5* 32.4*  MCV 94.7 94.7  PLT 161 162   Basic Metabolic Panel:  Recent Labs  Lab 07/24/19 0500 07/24/19 0500 07/24/19 0835 07/25/19 0212 07/25/19 0939 07/25/19 1428 07/26/19 0410 07/26/19 0814  NA 150*   < >   < > 159* 158*   < > 151* 152*  K 2.8*   < >  --   --  3.1*  --  2.7*  --   CL 125*   < >  --   --  127*  --  120*  --   CO2 21*   < >  --   --  26  --  25  --   GLUCOSE 117*   < >  --   --  148*  --  144*  --   BUN 15   < >  --   --  20  --  23*  --   CREATININE <0.30*   < >  --   --  0.57  --  0.47  --   CALCIUM 8.0*   < >  --   --  9.9  --  9.6  --   MG 2.1  --    < > 1.7  --   --  2.0  --   PHOS 1.9*  --   --  2.6  --   --   --   --    < > = values in this interval not displayed.   Lipid Panel:     Component Value Date/Time   CHOL 180 07/20/2019 0556   TRIG 488 (H) 07/25/2019 0213   HDL 45 07/20/2019 0556   CHOLHDL 4.0 07/20/2019 0556   VLDL 72 (H) 07/20/2019 0556   LDLCALC 63 07/20/2019 0556   HgbA1c:  Lab Results  Component Value Date   HGBA1C 5.7 (H) 07/19/2019   Urine Drug Screen:     Component Value Date/Time   LABOPIA NONE DETECTED 07/19/2019 0220   COCAINSCRNUR NONE DETECTED 07/19/2019 0220   LABBENZ NONE DETECTED 07/19/2019 0220   AMPHETMU NONE DETECTED 07/19/2019 0220   THCU NONE DETECTED 07/19/2019 0220   LABBARB NONE DETECTED 07/19/2019 0220    IMAGING past 24 hours DG Chest Port 1 View  Result Date: 07/26/2019 CLINICAL DATA:  Acute respiratory failure with hypoxemia EXAM: PORTABLE CHEST 1 VIEW COMPARISON:  07/22/2019 FINDINGS: Endotracheal tube tip at the clavicular heads. The orogastric tube reaches the stomach with tip at the fundus. PICC with tip at the SVC. Retrocardiac reticular density with trace pleural fluid. Cardiomegaly. IMPRESSION: 1. Unremarkable hardware positioning. 2. Atelectasis/infiltrate at the left base with trace pleural fluid. Electronically Signed   By: Marja Kays  Watts M.D.   On: 07/26/2019 05:43    PHYSICAL EXAM  Temp:  [99 F (37.2 C)-100.5 F (38.1 C)] 100.5 F (38.1 C) (03/23 0800) Pulse Rate:  [73-98] 88 (03/23 0800) Resp:  [12-25] 22 (03/23 0800) BP: (124-192)/(76-108) 145/95 (03/23 0800) SpO2:  [91 %-100 %] 91 % (03/23 0800) FiO2 (%):  [30 %] 30 % (03/23 0800) Weight:  [71.5 kg] 71.5 kg (03/23 0500)  General - Well nourished, well developed, intubated on sedation.  Ophthalmologic - fundi not visualized due to noncooperation.  Cardiovascular - Regular rhythm but tachycardia.  Neuro - intubated on sedation, eyes closed but easily open on voice, seems to follow eye close open and right hand and toe movement as requested. Eye more on left gaze position, doll's eyes present, not tracking, pupils 11mm and sluggish, blinking to visual threat on the left but not on the right. Corneal reflex + bilaterally, gag and cough present. Breathing over the vent.  Facial symmetry not able to test due to ET tube.  Tongue protrusion not cooperative. No movement of LUE and LLE even with pain. However, RUE bicep 3/5 and finger grip 3-/5 on command, RLE toe DF/PF 3/5 on request. DTR 1+ and toes equiv. Sensation, coordination and gait not tested.  ASSESSMENT/PLAN [name] is a 59 y.o. female with history of ICH 7 yrs ago, HTN found unresponsive. Found to have L sided hemiplegia, R gaze, SBP 250.  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 Moderate IVH. Mass effect w/ partial effacment R lateral and 3rd ventricles. 89mm L midline shift. Suspicious for early hydrocephalus.   Repeat CT head 3/16 interval L frontal lobe EVD w/ tip at R thalamus. Interval increased in R ICH now w/ 46mm L midline shift. IVH extended into B lateral, 3rd and 4th ventricles. Prominent B superior ophthalmic vein suggestion increased ICP.   CT repeat 3/17 - stable MLS 52mm, hematoma and hydrocephalus  CT repeat 3/20 - stable hematoma and edeam. Decreased hydrocephalus.   2D Echo EF 60-65%.No arterial finding for bleed . Severe LVH. 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  EEG severe diffuse encephalopathy   LDL 63   HgbA1c 5.7  Add Heparin 5000 units sq tid for VTE prophylaxis   No antithrombotic prior to admission, now on No antithrombotic given hemorrhage   Therapy recommendations:  Reorder when medically appropriate   Disposition:  pending   Cytotoxic Cerebral Edema Obstructive hydrocephalus Induced Hypernatremia  EVD placed 3/15 [name])  CT 3/16 - hematoma mild enlargement  CT 3/17 - stable hematoma and MLS  CT 3/20 - stable hematoma and edema with decreased hydrocephalus  PICC placement  Off 3% now, on NS @ 40  Na 134->...->159->154->151->152  EVD clamped 3/23 after tolerated rise to 20 on 3/22  Acute Respiratory Failure d/t ICH Probable Aspiration PNA  Intubated in ED  T-max 101.7->99.5->101.2->100.7->99.4->100.5   CCM on board  Sedation, off propofol, on fentanyl  Pneumonia - (ceftriaxone and metronidazole 3/16>>3/20) cefepime started 07/23/19>>  CXR 3/23 atelectasis/infiltrate L base w/ trace pleural fluid   Likely need early trach, family agrees - will discuss with CCM  Hypertensive Emergency  SBP > 250 on arrival  Home meds:  Coreg, HCTZ and  losartan  Still on Cleviprex gtt, taper off as able . SBP goal <160 . On losartan 50 bid and Coreg 25 bid and amlodipine  10 and catapres 0.1->0.2 tid . Long-term BP goal normotensive  Dysphagia . Secondary to stroke . NPO . On tube feeds @ 45 . On IVF @ 66  Speech to see once exutbated   Other Stroke Risk Factors  Obesity, Body mass index is 30.78 kg/m., recommend weight loss, diet and exercise as appropriate   Hx stroke/TIA  ICH 7 yrs ago. No data available in EPIC  Other Active Problems  Hypokalemia 3.2->5.6->4.1->3.0 -> 3.4->2.9->2.8->2.7 - supplement   Mild anemia - Hb - 11.3 ->11.4->10.2  Hospital day # 8  This patient is critically ill due to [place], IVH, hydrocephalus status post EVD, hypertensive emergency and at significant risk of neurological worsening, death form hematoma expansion, obstructive hydrocephalus, cerebral edema, brain herniation. This patient's care requires constant monitoring of vital signs, hemodynamics, respiratory and cardiac monitoring, review of multiple databases, neurological assessment, discussion with family, other specialists and medical decision making of high complexity. I spent 30 minutes of neurocritical care time in the care of this patient.   Rosalin Hawking, MD PhD Stroke Neurology 07/26/2019 8:56 AM   To contact Stroke Continuity provider, please refer to [email or web address] After hours, contact General Neurology

## 2019-07-26 NOTE — Progress Notes (Addendum)
CRITICAL VALUE STICKER  CRITICAL VALUE: potassium 2.7  MD NOTIFIED: Elink  TIME OF NOTIFICATION: 0520  RESPONSE: see orders

## 2019-07-26 NOTE — Progress Notes (Signed)
NAME:  Haley Hernandez, MRN:  [id number], DOB:  Oct 05, 1960, LOS: 8 ADMISSION DATE:  07/18/2019, CONSULTATION DATE:  07/19/2019 REFERRING MD:  Dr. Laurence Slate, CHIEF COMPLAINT:  ICH  Brief History   59 year old female with prior history ICH 7 yrs ago and HTN found unresponsive at home found to left hemiplegia and right gaze with SBP 250.  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  Intubated in ER for airway protection.  NSGY consulted and EVD placed.  Placed on cleviprex for blood pressure control and admitted to Neurology.  PCCM consulted for ventilator management.    Past Medical History  HTN OSA ICH (?2014)  Significant Hospital Events   3/15  Admitted  3/22 - Fentanyl on hold this am , some wiggling of R foot    Consults:  NSGY PCCM  Procedures:  3/15 ETT >> 3/15 EVD >>  Significant Diagnostic Tests:  3/15 CTH >>  3.2 x 2.9 x 4.2 cm acute parenchymal hemorrhage with surrounding edema centered within the posterior right basal ganglia and right thalamus. Moderate volume intraventricular extension. Mass effect with partial effacement of the right lateral and third ventricles, and 4 mm leftward midline shift. Mild prominence of the lateral ventricles suspicious for early entrapment/hydrocephalus.  3/17 CT head angio >> Size stable thalamic hematoma and midline shift (7 mm).  Stable EVD positioning and ventriculomegaly  3/20 CT heat w/o contrast Similar size of parenchymal hemorrhage centered within the right thalamus. Mass effect is similar.  Decreased hydrocephalus. Ventriculostomy catheter is in similar position with slightly increased small volume hemorrhage along the catheter tract.  Micro Data:  3/15 SARS2 / Flu A/B >> neg 3/15 MRSA PCR >> neg 3/17 resp cx :  Neg gm stain for wbc/org > no growth 3/20 BC x 2 >>> 3/20 Trach >>> rare wbc's/ no organisms >>>     ID/ cultures/data   3/16 ceftriaxone >> 3/20   3/16 Flagyl >>3/20 Maxepime 3/20 >>>       Interim history/subjective:    07/26/2019 - > remain on vent. DOing PSV. On 40% fi02. On fent gtt. On cleviprex. Per RN - can wiggle on right purposefully. No movement on left. Gag +. EVD being clamped.  Per neuro Dr Roda Shutters - heading towards a trach. No new culture data   Objective   Blood pressure (!) 164/96, pulse 98, temperature 99 F (37.2 C), temperature source Axillary, resp. rate (!) 25, height 5' (1.524 m), weight 71.5 kg, SpO2 93 %.    Vent Mode: PRVC FiO2 (%):  [30 %] 30 % Set Rate:  [18 bmp] 18 bmp Vt Set:  [360 mL] 360 mL PEEP:  [5 cmH20] 5 cmH20 Pressure Support:  [10 cmH20] 10 cmH20 Plateau Pressure:  [14 cmH20] 14 cmH20   Intake/Output Summary (Last 24 hours) at 07/26/2019 1443 Last data filed at 07/26/2019 0700 Gross per 24 hour  Intake 2804.49 ml  Output 3216 ml  Net -411.51 ml   Filed Weights   07/24/19 0500 07/25/19 0436 07/26/19 0500  Weight: 70.9 kg 71 kg 71.5 kg   General Appearance:  Looks criticall ill OBESE - + Head:  Normocephalic, without obvious abnormality, atraumatic Eyes:  PERRL -x , conjunctiva/corneas - muddy     Ears:  Normal external ear canals, both ears Nose:  G tube - no Throat:  ETT TUBE - yes , OG tube - yes Neck:  Supple,  No enlargement/tenderness/nodules Lungs: Clear to auscultation  bilaterally, Ventilator   Synchrony - yes Heart:  S1 and S2 normal, no murmur, CVP - no.  Pressors - no Abdomen:  Soft, no masses, no organomegaly Genitalia / Rectal:  Not done Extremities:  Extremities- intact Skin:  ntact in exposed areas . Sacral area - no decub per R Neurologic:  Sedation - fent gtt -> RASS - -4    Resolved Hospital Problem list    Assessment & Plan:   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 s/p EVD secondary to hypertensive crisis  07/26/2019 - improved per NSGY. Decreased hydrocephalus but similar mass effect. EVD clamped. Now on  Normal saline. Seems to be off 3% saline   P:  neurosurgery managing, draining bloody CSF   fentanyl IV drip prn, avoiding propofol with hight triglycerides     Hypertensive crisis  Hx HTN, unclear if on home meds UDS neg - noted  to be cocaine positive 2009  07/26/2019 - still on cleviprex  P:  Tele monitoring Cleviprex for SBP goal < 160 On carvedilol and losartan, amlodipine  3/21 increased coreg as not adequately Beta blocked as indicated by  pulse rate and may add clonidine next to block the adrergic system centrally as well.   Acute respiratory failure -Hx untreated OSA   07/26/2019 - doing SBT.  PLan  >>> rest back  On Full vent support - RT to wean only after check with MD given high ICP Prn albuterol  VAP bundle PPI   Aspiration pneumonitis (see micro flowsheet)   07/26/2019 - low grade fever +. On cefepime  Plan  - recheck PCT - if neg cultures and repeat PCT also normal - consider dc antibiotics    Electrolyte imblance  07/26/2019 - low K  Plan  - replete K  Anemia critical illness  07/26/2019 - no active bleeding  Plan  - - PRBC for hgb </= 6.9gm%    - exceptions are   -  if ACS susepcted/confirmed then transfuse for hgb </= 8.0gm%,  or    -  active bleeding with hemodynamic instability, then transfuse regardless of hemoglobin value   At at all times try to transfuse 1 unit prbc as possible with exception of active hemorrhage      Hyperglycemia  -A1c 5.7 >> CBG q 4/ SSI sensitive  >> continue tube feeds  Best practice:  Diet: TF Pain/Anxiety/Delirium protocol (if indicated): fentanyl drip prn  VAP protocol (if indicated): yes DVT prophylaxis: SCDs only  GI prophylaxis: changed to pepcid due to diarrhea  Glucose control: SSI sensitive Mobility: BR Code Status: Full  Family Communication: per neurology.  Disposition: Neuro ICU     ATTESTATION & SIGNATURE   The patient Haley Hernandez is critically ill with multiple organ systems  failure and requires high complexity decision making for assessment and support, frequent evaluation and titration of therapies, application of advanced monitoring technologies and extensive interpretation of multiple databases.   Critical Care Time devoted to patient care services described in this note is  40  Minutes. This time reflects time of care of this signee Dr Brand Males. This critical care time does not reflect procedure time, or teaching time or supervisory time of PA/NP/Med student/Med Resident etc but could involve care discussion time     Dr. Brand Males, M.D., [facility].C.P Pulmonary and Critical Care Medicine Staff Physician [facility] Pager: [phone], If no answer or between  15:00h - 7:00h:  call [phone]  07/26/2019 7:52 AM     LABS    PULMONARY No results for input(s): PHART, PCO2ART, PO2ART, HCO3, TCO2, O2SAT in the last 168 hours.  Invalid input(s): PCO2, PO2  CBC Recent Labs  Lab 07/23/19 0500 07/24/19 0500 07/26/19 0410  HGB 11.3* 11.4* 10.2*  HCT 35.1* 30.5* 32.4*  WBC 7.9 5.8 7.7  PLT 184 161 162    COAGULATION No results for input(s): INR in the last 168 hours.  CARDIAC  No results for input(s): TROPONINI in the last 168 hours. No results for input(s): PROBNP in the last 168 hours.   CHEMISTRY Recent Labs  Lab 07/21/19 0500 07/21/19 0811 07/22/19 0222 07/22/19 0800 07/23/19 0500 07/23/19 0731 07/24/19 0500 07/24/19 0835 07/25/19 0212 07/25/19 0212 07/25/19 0939 07/25/19 1428 07/25/19 2000 07/26/19 0210 07/26/19 0410  NA 154*   < > 155*   < > 151*   < > 150*   < > 159*   < > 158* 158* 156* 154* 151*  K 3.0*   < > 3.4*  --  2.9*   < > 2.8*   < >  --   --  3.1*  --   --   --  2.7*  CL 121*   < > 121*  --  119*  --  125*  --   --   --  127*  --   --   --  120*  CO2 24   < > 24  --  23  --  21*  --   --   --  26  --   --   --  25  GLUCOSE 141*   < > 136*  --  125*  --   117*  --   --   --  148*  --   --   --  144*  BUN 10   < > 17  --  27*  --  15  --   --   --  20  --   --   --  23*  CREATININE 0.54   < > 0.65  --  0.68  --  <0.30*  --   --   --  0.57  --   --   --  0.47  CALCIUM 9.8   < > 10.2  --  9.9  --  8.0*  --   --   --  9.9  --   --   --  9.6  MG 1.9   < > 1.9  --  1.9  --  2.1  --  1.7  --   --   --   --   --  2.0  PHOS 1.8*  --  3.2  --  2.4*  --  1.9*  --  2.6  --   --   --   --   --   --    < > = values in this interval not displayed.   Estimated Creatinine Clearance: 68.5 mL/min (by C-G formula based on SCr of 0.47 mg/dL).   LIVER No results for input(s): AST, ALT, ALKPHOS, BILITOT, PROT, ALBUMIN, INR in the last 168 hours.   INFECTIOUS Recent Labs  Lab 07/25/19 0212  PROCALCITON <0.10     ENDOCRINE CBG (last 3)  Recent Labs    07/25/19 2308 07/26/19 0312 07/26/19 0729  GLUCAP 140* 142* 151*         IMAGING x78h  -  image(s) personally visualized  -   highlighted in bold  CT HEAD WO CONTRAST  Result Date: 07/23/2019 CLINICAL DATA:  Intracranial hemorrhage follow-up EXAM: CT HEAD WITHOUT CONTRAST TECHNIQUE: Contiguous axial images were obtained from the base of the skull through the vertex without intravenous contrast. COMPARISON:  07/19/2019 FINDINGS: Brain: Parenchymal hemorrhage centered within the right thalamus is again identified with similar size. Significant intraventricular extension is again noted. Associated edema and mass effect are similar with approximately 8 mm leftward midline shift at the level of the third ventricle. Left frontal approach ventriculostomy catheter is in similar position. There is slightly increased hemorrhage along the catheter tract. Decreased size of the lateral ventricles. No new loss of gray-white differentiation. Vascular: No new findings. Skull: No new findings. Sinuses/Orbits: No acute abnormality. Other: None. IMPRESSION: Similar size of parenchymal hemorrhage centered within the right  thalamus. Mass effect is similar. Decreased hydrocephalus. Ventriculostomy catheter is in similar position with slightly increased small volume hemorrhage along the catheter tract. Electronically Signed   By: Guadlupe Spanish M.D.   On: 07/23/2019 15:22   DG Chest Port 1 View  Result Date: 07/26/2019 CLINICAL DATA:  Acute respiratory failure with hypoxemia EXAM: PORTABLE CHEST 1 VIEW COMPARISON:  07/22/2019 FINDINGS: Endotracheal tube tip at the clavicular heads. The orogastric tube reaches the stomach with tip at the fundus. PICC with tip at the SVC. Retrocardiac reticular density with trace pleural fluid. Cardiomegaly. IMPRESSION: 1. Unremarkable hardware positioning. 2. Atelectasis/infiltrate at the left base with trace pleural fluid. Electronically Signed   By: Marnee Spring M.D.   On: 07/26/2019 05:43

## 2019-07-26 NOTE — Progress Notes (Signed)
Patient ID: Haley Hernandez, female   DOB: October 10, 1960, 59 y.o.   MRN: [id number] Eyes open, FC. ventric patent at 20, output has slowed. Will clamp and monitor drain in hopes of removal soon

## 2019-07-26 NOTE — Progress Notes (Signed)
[facility] ADULT ICU REPLACEMENT PROTOCOL FOR AM LAB REPLACEMENT ONLY  The patient does apply for the [facility] Adult ICU Electrolyte Replacment Protocol based on the criteria listed below:   1. Is GFR >/= 40 ml/min? Yes.    Patient's GFR today is >60 2. Is urine output >/= 0.5 ml/kg/hr for the last 6 hours? Yes.   Patient's UOP is 4.6 ml/kg/hr 3. Is BUN < 60 mg/dL? Yes.    Patient's BUN today is 23 4. Abnormal electrolyte(s): K+ 2.8. Ordered repletion with: protocol 6. If a panic level lab has been reported, has the CCM MD in charge been notified? Yes.  .   Physician:  Lebron Conners 07/26/2019 5:49 AM

## 2019-07-26 NOTE — Progress Notes (Signed)
Met with daughters at bedside Eldest in APP with Willey Blade  xxxxxxx Discussed need for tracheostomy as recommended by APP Prolonged Mech Ventilation Risks of bleeding, infection, stricture etc., Explained 2 person procedure Explained that procedure can be done Dr Erskine Emery - on Saturday 07/30/2019  Family agreeable and desire full code     SIGNATURE    Dr. Brand Males, M.D., F.C.C.P,  Pulmonary and Critical Care Medicine Staff Physician, [place] Director - Interstitial Lung Disease  Program  [facility] [place] [facility] [place], [place], [address]  Pager: [phone], If no answer or between  15:00h - 7:00h: call [phone] Telephone: [phone]  5:46 PM 07/26/2019

## 2019-07-27 ENCOUNTER — Inpatient Hospital Stay (HOSPITAL_COMMUNITY): Payer: 59

## 2019-07-27 ENCOUNTER — Ambulatory Visit: Payer: BLUE CROSS/BLUE SHIELD

## 2019-07-27 LAB — URINALYSIS, COMPLETE (UACMP) WITH MICROSCOPIC
Bilirubin Urine: NEGATIVE
Glucose, UA: 50 mg/dL — AB
Hgb urine dipstick: NEGATIVE
Ketones, ur: 5 mg/dL — AB
Leukocytes,Ua: NEGATIVE
Nitrite: NEGATIVE
Protein, ur: 30 mg/dL — AB
Specific Gravity, Urine: 1.031 — ABNORMAL HIGH (ref 1.005–1.030)
pH: 5 (ref 5.0–8.0)

## 2019-07-27 LAB — COMPREHENSIVE METABOLIC PANEL
ALT: 18 U/L (ref 0–44)
AST: 15 U/L (ref 15–41)
Albumin: 2.8 g/dL — ABNORMAL LOW (ref 3.5–5.0)
Alkaline Phosphatase: 31 U/L — ABNORMAL LOW (ref 38–126)
Anion gap: 6 (ref 5–15)
BUN: 27 mg/dL — ABNORMAL HIGH (ref 6–20)
CO2: 25 mmol/L (ref 22–32)
Calcium: 10.3 mg/dL (ref 8.9–10.3)
Chloride: 121 mmol/L — ABNORMAL HIGH (ref 98–111)
Creatinine, Ser: 0.66 mg/dL (ref 0.44–1.00)
GFR calc Af Amer: >60 mL/min (ref >60)
GFR calc non Af Amer: >60 mL/min (ref >60)
Glucose, Bld: 167 mg/dL — ABNORMAL HIGH (ref 70–99)
Potassium: 3.8 mmol/L (ref 3.5–5.1)
Sodium: 152 mmol/L — ABNORMAL HIGH (ref 135–145)
Total Bilirubin: 0.6 mg/dL (ref 0.3–1.2)
Total Protein: 6.6 g/dL (ref 6.5–8.1)

## 2019-07-27 LAB — MAGNESIUM: Magnesium: 2.1 mg/dL (ref 1.7–2.4)

## 2019-07-27 LAB — GLUCOSE, CAPILLARY
Glucose-Capillary: 158 mg/dL — ABNORMAL HIGH (ref 70–99)
Glucose-Capillary: 135 mg/dL — ABNORMAL HIGH (ref 70–99)
Glucose-Capillary: 201 mg/dL — ABNORMAL HIGH (ref 70–99)
Glucose-Capillary: 153 mg/dL — ABNORMAL HIGH (ref 70–99)
Glucose-Capillary: 149 mg/dL — ABNORMAL HIGH (ref 70–99)
Glucose-Capillary: 185 mg/dL — ABNORMAL HIGH (ref 70–99)

## 2019-07-27 LAB — CBC
HCT: 33 % — ABNORMAL LOW (ref 36.0–46.0)
Hemoglobin: 10 g/dL — ABNORMAL LOW (ref 12.0–15.0)
MCH: 28.3 pg (ref 26.0–34.0)
MCHC: 30.3 g/dL (ref 30.0–36.0)
MCV: 93.5 fL (ref 80.0–100.0)
Platelets: 184 10*3/uL (ref 150–400)
RBC: 3.53 MIL/uL — ABNORMAL LOW (ref 3.87–5.11)
RDW: 15.7 % — ABNORMAL HIGH (ref 11.5–15.5)
WBC: 9.3 10*3/uL (ref 4.0–10.5)
nRBC: 0 % (ref 0.0–0.2)

## 2019-07-27 LAB — PROCALCITONIN: Procalcitonin: <0.1 ng/mL

## 2019-07-27 NOTE — Progress Notes (Signed)
NAME:  Haley Hernandez, MRN:  [id number], DOB:  July 02, 1960, LOS: 9 ADMISSION DATE:  07/18/2019, CONSULTATION DATE:  07/19/2019 REFERRING MD:  Dr. Lorraine Lax, CHIEF COMPLAINT:  ICH  Brief History   59 year old female with prior history ICH 7 yrs ago and HTN found unresponsive at home found to left hemiplegia and right gaze with SBP 250.  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  Intubated in ER for airway protection.  NSGY consulted and EVD placed.  Placed on cleviprex for blood pressure control and admitted to Neurology.  PCCM consulted for ventilator management.   Past Medical History  HTN OSA ICH (?2014)  Significant Hospital Events   3/15 Admitted, ventriculostomy placed 3/23 ventriculostomy removed 3/24 Fever 101F  Consults:  NSGY  Procedures:  3/15 ETT >> 3/15 EVD >> 3/16 PICC >>   Significant Diagnostic Tests:  3/15 CT head >> 3.2 x 2.9 x 4.2 cm ICH in posterior Rt BG and Rt thalamus, mod IV extension, 4 mm Lt shift 3/16 Echo >> EF 60 to 65%, severe concentric LVH 3/17 CT head >> 7 mm shift 3/17 EEG >> generalized slowing 3/20 CT head >> decreased hydrocephalus  Micro Data:  SARS CoV2 PCR 3/15 >> negative Sputum 3/17 >> negative Sputum 3/20 >> negative Blood 3/20 >>  ID/ cultures/data   Rocephin 3/16 >> 3/19 Flagyl 3/16 >> 3/20 Cefepime 3/20 >>   Interim history/subjective:  Remains on vent, cleviprex.  Objective   Blood pressure (!) 155/87, pulse 90, temperature (!) 101 F (38.3 C), temperature source Axillary, resp. rate (!) 28, height 5' (1.524 m), weight 71.5 kg, SpO2 95 %.    Vent Mode: PRVC FiO2 (%):  [30 %] 30 % Set Rate:  [18 bmp] 18 bmp Vt Set:  [360 mL] 360 mL PEEP:  [5 cmH20] 5 cmH20 Plateau Pressure:  [13 cmH20-15 cmH20] 13 cmH20   Intake/Output Summary (Last 24 hours) at 07/27/2019 0841 Last data filed at 07/27/2019 0800 Gross per 24 hour  Intake 2281.06 ml  Output 1500 ml  Net  781.06 ml   Filed Weights   07/24/19 0500 07/25/19 0436 07/26/19 0500  Weight: 70.9 kg 71 kg 71.5 kg    General - on vent Eyes - pupils reactive ENT - ETT in place, enlarged tongue Cardiac - regular rate/rhythm, no murmur Chest - scattered rhonchi Abdomen - soft, non tender, + bowel sounds Extremities - no cyanosis, clubbing, or edema Skin - no rashes Neuro - follows simple commands, moves Rt   Resolved Hospital Problem list     Assessment & Plan:   Acute hypoxic respiratory failure with compromised airway. Likely also has OSA. - not candidate for extubation at this time due to neuro status and upper airway anatomy - might need trach - f/u CXR intermittently  HTN emergency. 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 - per neurology and neurosurgery  Aspiration pneumonia - continue cefepime  Hyperglycemia. - SSI  Anemia of critical illness. - f/u CBC - transfuse for Hb < 7 or significant bleeding  Best practice:  Diet: TF DVT prophylaxis: SCDs GI prophylaxis: Pepcid Mobility: BR Code Status: Full    Labs:   CMP Latest Ref Rng & Units 07/27/2019 07/26/2019 07/26/2019  Glucose 70 - 99 mg/dL 167(H) 162(H) 161(H)  BUN 6 - 20 mg/dL 27(H) 30(H) 30(H)  Creatinine 0.44 -  1.00 mg/dL 2.76 1.47 0.92  Sodium 135 - 145 mmol/L 152(H) 155(H) 155(H)  Potassium 3.5 - 5.1 mmol/L 3.8 3.9 3.8  Chloride 98 - 111 mmol/L 121(H) 125(H) 121(H)  CO2 22 - 32 mmol/L 25 26 26   Calcium 8.9 - 10.3 mg/dL 95.7 47.3  Total Protein 6.5 - 8.1 g/dL 6.6 - -  Total Bilirubin 0.3 - 1.2 mg/dL 0.6 - -  Alkaline Phos 38 - 126 U/L 31(L) - -  AST 15 - 41 U/L 15 - -  ALT 0 - 44 U/L 18 - -    CBC Latest Ref Rng & Units 07/27/2019 07/26/2019 07/24/2019  WBC 4.0 - 10.5 K/uL 9.3 7.7 5.8  Hemoglobin 12.0 - 15.0 g/dL 10.0(L) 10.2(L) 11.4(L)  Hematocrit 36.0 - 46.0 % 33.0(L) 32.4(L) 30.5(L)   Platelets 150 - 400 K/uL 184 162 161    ABG    Component Value Date/Time   PHART 7.362 07/19/2019 0105   PCO2ART 50.3 (H) 07/19/2019 0105   PO2ART 90.0 07/19/2019 0105   HCO3 28.7 (H) 07/19/2019 0105   TCO2 30 07/19/2019 0105   O2SAT 97.0 07/19/2019 0105    CBG (last 3)  Recent Labs    07/26/19 2311 07/27/19 0310 07/27/19 0808  GLUCAP 146* 158* 135*    CXR (reviewed by me) - interstitial edema  CC time 32 minutes  07/29/19, MD [facility] Pulmonary/Critical Care 07/27/2019, 8:57 AM

## 2019-07-27 NOTE — Procedures (Signed)
Cortrak  Person Inserting Tube:  Haley Hernandez, RD Tube Type:  Cortrak - 43 inches Tube Location:  Left nare Initial Placement:  Stomach Secured by: Bridle Technique Used to Measure Tube Placement:  Documented cm marking at nare/ corner of mouth Cortrak Secured At:  74 cm    Cortrak Tube Team Note:  Consult received to place a Cortrak feeding tube.   No x-ray is required. RN may begin using tube.   If the tube becomes dislodged please keep the tube and contact the Cortrak team at [email or web address] (password [email or web address]) for replacement.  If after hours and replacement cannot be delayed, place a NG tube and confirm placement with an abdominal x-ray.    Haley Yamada, MS, RD, LDN RD pager number and weekend/on-call pager number located in Amion.  

## 2019-07-27 NOTE — Progress Notes (Addendum)
STROKE TEAM PROGRESS NOTE   INTERVAL HISTORY No family at bedside. RN at bedside. Pt on weaning trial this am but not candidate for extubation. Plan for trach this Saturday. EVD has been removed. BP was elevated and put back on cleviprex. Will repeat CT in am.   Vitals:   07/27/19 0600 07/27/19 0700 07/27/19 0800 07/27/19 0858  BP: (!) 158/90 (!) 151/88 (!) 155/87   Pulse: 80 87 90   Resp: (!) 21 (!) 21 (!) 28   Temp:   (!) 101 F (38.3 C)   TempSrc:   Axillary   SpO2: 97% 94% 95% 94%  Weight:      Height:       CBC:  Recent Labs  Lab 07/26/19 0410 07/27/19 0558  WBC 7.7 9.3  HGB 10.2* 10.0*  HCT 32.4* 33.0*  MCV 94.7 93.5  PLT 162 248   Basic Metabolic Panel:  Recent Labs  Lab 07/24/19 0500 07/24/19 0835 07/25/19 0212 07/25/19 0939 07/26/19 0410 07/26/19 0814 07/26/19 2020 07/27/19 0558  NA 150*   < > 159*   < > 151*   < > 155* 152*  K 2.8*  --   --    < > 2.7*   < > 3.9 3.8  CL 125*  --   --    < > 120*   < > 125* 121*  CO2 21*  --   --    < > 25   < > 26 25  GLUCOSE 117*  --   --    < > 144*   < > 162* 167*  BUN 15  --   --    < > 23*   < > 30* 27*  CREATININE <0.30*  --   --    < > 0.47   < > 0.70 0.66  CALCIUM 8.0*  --   --    < > 9.6   < > 10.1 10.3  MG 2.1   < > 1.7  --  2.0  --   --  2.1  PHOS 1.9*  --  2.6  --   --   --   --   --    < > = values in this interval not displayed.   Lipid Panel:     Component Value Date/Time   CHOL 180 07/20/2019 0556   TRIG 488 (H) 07/25/2019 0213   HDL 45 07/20/2019 0556   CHOLHDL 4.0 07/20/2019 0556   VLDL 72 (H) 07/20/2019 0556   LDLCALC 63 07/20/2019 0556   HgbA1c:  Lab Results  Component Value Date   HGBA1C 5.7 (H) 07/19/2019   Urine Drug Screen:     Component Value Date/Time   LABOPIA NONE DETECTED 07/19/2019 0220   COCAINSCRNUR NONE DETECTED 07/19/2019 0220   LABBENZ NONE DETECTED 07/19/2019 0220   AMPHETMU NONE DETECTED 07/19/2019 0220   THCU NONE DETECTED 07/19/2019 0220   LABBARB NONE DETECTED  07/19/2019 0220   IMAGING past 24 hours DG CHEST PORT 1 VIEW  Result Date: 07/27/2019 CLINICAL DATA:  Shortness of breath EXAM: PORTABLE CHEST 1 VIEW COMPARISON:  July 26, 2019 FINDINGS: The heart size and mediastinal contours are unchanged with mild cardiomegaly. ETT is 3 cm above the carina. NG tube is seen within the proximal stomach. A right-sided PICC is seen with the tip at the superior cavoatrial junction. There is a small left pleural effusion. There is prominence of the central pulmonary vasculature. No large airspace consolidation. No acute  osseous abnormality. IMPRESSION: ET tube and lines are in satisfactory position. Pulmonary vascular congestion and mild interstitial edema. Mild cardiomegaly Electronically Signed   By: Jonna Clark M.D.   On: 07/27/2019 05:21    PHYSICAL EXAM  Temp:  [98.7 F (37.1 C)-101 F (38.3 C)] 101 F (38.3 C) (03/24 0800) Pulse Rate:  [75-93] 90 (03/24 0800) Resp:  [19-28] 28 (03/24 0800) BP: (112-165)/(73-97) 155/87 (03/24 0800) SpO2:  [91 %-97 %] 94 % (03/24 0858) FiO2 (%):  [30 %] 30 % (03/24 0858)  General - Well nourished, well developed, intubated on fentanyl.  Ophthalmologic - fundi not visualized due to noncooperation.  Cardiovascular - Regular rhythm but tachycardia.  Neuro - intubated on fentanyl, eyes closed but easily open on voice, seems to follow eye close open and right hand movement as requested, but did not move toes as requested today. Eyes mid position but left eye more at downward gaze position, doll's eyes present, not tracking, pupils 89mm and sluggish, blinking to visual threat on the left but not on the right. Corneal reflex + bilaterally, gag and cough present. Breathing over the vent.  Facial symmetry not able to test due to ET tube.  Tongue protrusion not cooperative. No movement of LUE and LLE even with pain. However, RUE slight withdraw and finger slight movement on command, RLE toe did not move on request but withdraw  to pain. DTR 1+ and toes equiv. Sensation, coordination and gait not tested.  ASSESSMENT/PLAN Ms. Sharina Petre is a 59 y.o. female with history of ICH 7 yrs ago, HTN found unresponsive. Found to have L sided hemiplegia, R gaze, SBP 250.  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 Moderate IVH. Mass effect w/ partial effacment R lateral and 3rd ventricles. 2mm L midline shift. Suspicious for early hydrocephalus.   Repeat CT head 3/16 interval L frontal lobe EVD w/ tip at R thalamus. Interval increased in R ICH now w/ 95mm L midline shift. IVH extended into B lateral, 3rd and 4th ventricles. Prominent B superior ophthalmic vein suggestion increased ICP.   CT repeat 3/17 - stable MLS 4mm, hematoma and hydrocephalus  CT repeat 3/20 - stable hematoma and edeam. Decreased hydrocephalus.   CT repeat pending  2D Echo EF 60-65%.No arterial finding for bleed . Severe LVH. 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  EEG severe diffuse encephalopathy   LDL 63   HgbA1c 5.7  Heparin 5000 units sq tid for VTE prophylaxis   No antithrombotic prior to admission, now on No antithrombotic given hemorrhage   Therapy recommendations:  Reorder when medically appropriate   Disposition:  pending   Cytotoxic Cerebral Edema Obstructive hydrocephalus Induced Hypernatremia  EVD placed 3/15 [name])  CT 3/16 - hematoma mild enlargement  CT 3/17 - stable hematoma and MLS  CT 3/20 - stable hematoma and edema with decreased hydrocephalus  PICC placement  Off 3% now, on NS @ 40  Na 134->...->159->154->151->152->155-<155->152  EVD removed 3/24  CT repeat 3/25 pending  Acute Respiratory Failure d/t ICH Probable Aspiration PNA Fever   Intubated in ED  T-max 101.7->99.5->101.2->100.7->99.4->100.5->101.4  CCM on board  Sedation, off propofol, on fentanyl  Pneumonia -  (ceftriaxone and metronidazole 3/16>>3/20)  Cefepime 07/23/19>>07/27/19  CXR 3/24 Pulmonary vascular congestion and mild interstitial edema.  UA neg  Blood culture pending  Trach planned Saturday  Hypertensive Emergency  SBP >  250 on arrival  Home meds:  Coreg, HCTZ and losartan  Still on Cleviprex gtt, taper off as able . SBP goal <160 . On losartan 50 bid and Coreg 25 bid and amlodipine 10 and catapres 0.1->0.2 tid . Long-term BP goal normotensive  Dysphagia . Secondary to stroke . NPO . Cortrak . On tube feeds @ 45 . On IVF @ 40  Speech to see once exutbated   Other Stroke Risk Factors  Obesity, Body mass index is 30.78 kg/m., recommend weight loss, diet and exercise as appropriate   Hx stroke/TIA  ICH 7 yrs ago. No data available in EPIC  obstructive sleep apnea  Other Active Problems  Hypokalemia 3.2->5.6->4.1->3.0 -> 3.4->2.9->2.8->2.7->3.8->3.9->3.8   Mild anemia of critical illness - Hb - 11.3 ->11.4->10.2->10.0  Hospital day # 9  This patient is critically ill due to ICH, IVH, hydrocephalus status post EVD, hypertensive emergency and at significant risk of neurological worsening, death form hematoma expansion, obstructive hydrocephalus, cerebral edema, brain herniation. This patient's care requires constant monitoring of vital signs, hemodynamics, respiratory and cardiac monitoring, review of multiple databases, neurological assessment, discussion with family, other specialists and medical decision making of high complexity. I spent 30 minutes of neurocritical care time in the care of this patient. I discussed with Dr. Craige Cotta.   Marvel Plan, MD PhD Stroke Neurology 07/27/2019 10:12 AM   To contact Stroke Continuity provider, please refer to [email or web address]. After hours, contact General Neurology

## 2019-07-27 NOTE — Care Management (Addendum)
I spoke with patient's daughters at bedside, Haley Hernandez, and on speaker phone, Haley Hernandez. Started discussion about disposition.  Plan is for trach placement on Saturday. Haley Hernandez states that she is a Lawyer, and Haley Hernandez is a NP. Is is clear that they have a foundational understanding of care needs and medical literacy. I helped to explain that disposition has many determining factors, including insurance. They provided a Medicaid card (number [id number] M, issued 09/13/2018) and believe that the patient may also have BS[employer] Medicare although Haley Hernandez is not sure that the patient kept up on the premiums.  We discussed the potential for LTACH, although this may be low due to payor source, they agreed to consult both [facility]. They do not prefer for their mom to go far or out of state, and would rather care for her at home if no other options are available. We discussed the importance of both pf them being involved in her care and trach care, before she leaves the hospital.  I have sent Medicaid info to Dana Allan in patient accounting to verify. Once verified will reach out to [facility] for initial referral.  Update- Medicaid is family planning, and per Haley Hernandez it will not cover this hospitalization.  [facility] will not accept it for payment either. Updated daughter in room.        Hernandez,Haley Daughter [phone]  [phone]  Haley, Hernandez  [phone]  [phone]

## 2019-07-27 NOTE — Progress Notes (Signed)
Subjective: Patient intubated and following some simple commands  Objective: Vital signs in last 24 hours: Temp:  [98.7 F (37.1 C)-101 F (38.3 C)] 101 F (38.3 C) (03/24 0800) Pulse Rate:  [75-93] 90 (03/24 0800) Resp:  [19-28] 28 (03/24 0800) BP: (112-165)/(73-97) 155/87 (03/24 0800) SpO2:  [91 %-97 %] 94 % (03/24 0858) FiO2 (%):  [30 %] 30 % (03/24 0858)  Intake/Output from previous day: 03/23 0701 - 03/24 0700 In: 2219 [I.V.:1021.2; NG/GT:980; IV Piggyback:217.8] Out: 1500 [Urine:1050; Stool:450] Intake/Output this shift: Total I/O In: 165.1 [I.V.:83.1; IV Piggyback:82] Out: -   Lab Results: Lab Results  Component Value Date   WBC 9.3 07/27/2019   HGB 10.0 (L) 07/27/2019   HCT 33.0 (L) 07/27/2019   MCV 93.5 07/27/2019   PLT 184 07/27/2019   Lab Results  Component Value Date   INR 1.0 07/18/2019   BMET Lab Results  Component Value Date   NA 152 (H) 07/27/2019   K 3.8 07/27/2019   CL 121 (H) 07/27/2019   CO2 25 07/27/2019   GLUCOSE 167 (H) 07/27/2019   BUN 27 (H) 07/27/2019   CREATININE 0.66 07/27/2019   CALCIUM 10.3 07/27/2019    Studies/Results: DG CHEST PORT 1 VIEW  Result Date: 07/27/2019 CLINICAL DATA:  Shortness of breath EXAM: PORTABLE CHEST 1 VIEW COMPARISON:  July 26, 2019 FINDINGS: The heart size and mediastinal contours are unchanged with mild cardiomegaly. ETT is 3 cm above the carina. NG tube is seen within the proximal stomach. A right-sided PICC is seen with the tip at the superior cavoatrial junction. There is a small left pleural effusion. There is prominence of the central pulmonary vasculature. No large airspace consolidation. No acute osseous abnormality. IMPRESSION: ET tube and lines are in satisfactory position. Pulmonary vascular congestion and mild interstitial edema. Mild cardiomegaly Electronically Signed   By: Jonna Clark M.D.   On: 07/27/2019 05:21   DG Chest Port 1 View  Result Date: 07/26/2019 CLINICAL DATA:  Acute  respiratory failure with hypoxemia EXAM: PORTABLE CHEST 1 VIEW COMPARISON:  07/22/2019 FINDINGS: Endotracheal tube tip at the clavicular heads. The orogastric tube reaches the stomach with tip at the fundus. PICC with tip at the SVC. Retrocardiac reticular density with trace pleural fluid. Cardiomegaly. IMPRESSION: 1. Unremarkable hardware positioning. 2. Atelectasis/infiltrate at the left base with trace pleural fluid. Electronically Signed   By: Marnee Spring M.D.   On: 07/26/2019 05:43    Assessment/Plan: EVD has been clamped for over 24 hours now. ICP has been less than 12 since yesterday morning. EVD removed today.    LOS: 9 days    Haley Hernandez [facility] 07/27/2019, 9:49 AM

## 2019-07-28 ENCOUNTER — Inpatient Hospital Stay (HOSPITAL_COMMUNITY): Payer: 59

## 2019-07-28 LAB — CULTURE, BLOOD (ROUTINE X 2)
Culture: NO GROWTH
Culture: NO GROWTH
Special Requests: ADEQUATE

## 2019-07-28 LAB — MAGNESIUM: Magnesium: 2.1 mg/dL (ref 1.7–2.4)

## 2019-07-28 LAB — URINE CULTURE: Culture: NO GROWTH

## 2019-07-28 LAB — CBC
HCT: 33.1 % — ABNORMAL LOW (ref 36.0–46.0)
Hemoglobin: 9.9 g/dL — ABNORMAL LOW (ref 12.0–15.0)
MCH: 28.1 pg (ref 26.0–34.0)
MCHC: 29.9 g/dL — ABNORMAL LOW (ref 30.0–36.0)
MCV: 94 fL (ref 80.0–100.0)
Platelets: 216 10*3/uL (ref 150–400)
RBC: 3.52 MIL/uL — ABNORMAL LOW (ref 3.87–5.11)
RDW: 15.5 % (ref 11.5–15.5)
WBC: 10.3 10*3/uL (ref 4.0–10.5)
nRBC: 0 % (ref 0.0–0.2)

## 2019-07-28 LAB — BASIC METABOLIC PANEL
Anion gap: 8 (ref 5–15)
BUN: 25 mg/dL — ABNORMAL HIGH (ref 6–20)
CO2: 25 mmol/L (ref 22–32)
Calcium: 10.4 mg/dL — ABNORMAL HIGH (ref 8.9–10.3)
Chloride: 117 mmol/L — ABNORMAL HIGH (ref 98–111)
Creatinine, Ser: 0.61 mg/dL (ref 0.44–1.00)
GFR calc Af Amer: >60 mL/min (ref >60)
GFR calc non Af Amer: >60 mL/min (ref >60)
Glucose, Bld: 147 mg/dL — ABNORMAL HIGH (ref 70–99)
Potassium: 3.2 mmol/L — ABNORMAL LOW (ref 3.5–5.1)
Sodium: 150 mmol/L — ABNORMAL HIGH (ref 135–145)

## 2019-07-28 LAB — GLUCOSE, CAPILLARY
Glucose-Capillary: 159 mg/dL — ABNORMAL HIGH (ref 70–99)
Glucose-Capillary: 128 mg/dL — ABNORMAL HIGH (ref 70–99)
Glucose-Capillary: 124 mg/dL — ABNORMAL HIGH (ref 70–99)
Glucose-Capillary: 144 mg/dL — ABNORMAL HIGH (ref 70–99)
Glucose-Capillary: 146 mg/dL — ABNORMAL HIGH (ref 70–99)
Glucose-Capillary: 150 mg/dL — ABNORMAL HIGH (ref 70–99)

## 2019-07-28 LAB — TRIGLYCERIDES: Triglycerides: 272 mg/dL — ABNORMAL HIGH (ref <150)

## 2019-07-28 LAB — PROCALCITONIN: Procalcitonin: <0.1 ng/mL

## 2019-07-28 MED ORDER — POTASSIUM CHLORIDE 20 MEQ/15ML (10%) PO SOLN
40.0000 meq | Freq: Once | ORAL | Status: AC
  Administered 2019-07-28: 40 meq
  Filled 2019-07-28: qty 30

## 2019-07-28 MED ORDER — SODIUM CHLORIDE 0.9 % IV SOLN: INTRAVENOUS | Status: DC

## 2019-07-28 NOTE — Progress Notes (Signed)
STROKE TEAM PROGRESS NOTE   INTERVAL HISTORY Daughter and RN at bedside. Pt eyes closed but easily open on voice and able to maintain opening. Eyes mid position, conjugated. Followed simple commands on the right finger and toe but needed repeated requesting. CT showed increased ventricle size comparing with 5 days ago. Afebrile this am. UA neg. Pending trach Saturday.    Vitals:   07/28/19 0600 07/28/19 0630 07/28/19 0700 07/28/19 0800  BP: 124/78 129/80 135/82 (!) 145/89  Pulse: 73 73 73 75  Resp: 18 18 17 15   Temp:    98.4 F (36.9 C)  TempSrc:    Axillary  SpO2: 96% 97% 97% 100%  Weight:      Height:       CBC:  Recent Labs  Lab 07/27/19 0558 07/28/19 0509  WBC 9.3 10.3  HGB 10.0* 9.9*  HCT 33.0* 33.1*  MCV 93.5 94.0  PLT 184 967   Basic Metabolic Panel:  Recent Labs  Lab 07/24/19 0500 07/24/19 0835 07/25/19 0212 07/25/19 0939 07/27/19 0558 07/28/19 0509  NA 150*   < > 159*   < > 152* 150*  K 2.8*  --   --    < > 3.8 3.2*  CL 125*  --   --    < > 121* 117*  CO2 21*  --   --    < > 25 25  GLUCOSE 117*  --   --    < > 167* 147*  BUN 15  --   --    < > 27* 25*  CREATININE <0.30*  --   --    < > 0.66 0.61  CALCIUM 8.0*  --   --    < > 10.3 10.4*  MG 2.1   < > 1.7   < > 2.1 2.1  PHOS 1.9*  --  2.6  --   --   --    < > = values in this interval not displayed.   Lipid Panel:     Component Value Date/Time   CHOL 180 07/20/2019 0556   TRIG 272 (H) 07/28/2019 0509   HDL 45 07/20/2019 0556   CHOLHDL 4.0 07/20/2019 0556   VLDL 72 (H) 07/20/2019 0556   LDLCALC 63 07/20/2019 0556   HgbA1c:  Lab Results  Component Value Date   HGBA1C 5.7 (H) 07/19/2019   Urine Drug Screen:     Component Value Date/Time   LABOPIA NONE DETECTED 07/19/2019 0220   COCAINSCRNUR NONE DETECTED 07/19/2019 0220   LABBENZ NONE DETECTED 07/19/2019 0220   AMPHETMU NONE DETECTED 07/19/2019 0220   THCU NONE DETECTED 07/19/2019 0220   LABBARB NONE DETECTED 07/19/2019 0220   IMAGING past  24 hours CT HEAD WO CONTRAST  Result Date: 07/28/2019 CLINICAL DATA:  Follow-up cerebral hemorrhage EXAM: CT HEAD WITHOUT CONTRAST TECHNIQUE: Contiguous axial images were obtained from the base of the skull through the vertex without intravenous contrast. COMPARISON:  Five days ago FINDINGS: Brain: Indistinct periphery of the right thalamic hematoma, expected evolution. Maximal high-density hematoma dimensions is 3.6 cm, decreased from 3.9 cm when measured in the same fashion. Intraventricular blood clot is diminished. Ventriculostomy has been removed with mildly increased ventricular volume. No evidence of acute hemorrhage. The hematoma and swelling causes 1 cm of midline shift. Vascular: Atherosclerotic calcification. Skull: Negative for fracture or bone lesion. Sinuses/Orbits: Nasal intubation with nasal and paranasal sinus opacification. IMPRESSION: 1. Expected evolution of right thalamic hematoma with decreasing intraventricular clot. 2. Increased ventricular  volume after ventriculostomy removal. 3. 1 cm of midline shift. 4. Sinusitis in the setting of nasal intubation. Electronically Signed   By: Marnee Spring M.D.   On: 07/28/2019 06:46    PHYSICAL EXAM  Temp:  [98.4 F (36.9 C)-101.4 F (38.6 C)] 98.4 F (36.9 C) (03/25 0800) Pulse Rate:  [70-90] 75 (03/25 0800) Resp:  [15-25] 15 (03/25 0800) BP: (101-203)/(67-114) 145/89 (03/25 0800) SpO2:  [92 %-100 %] 100 % (03/25 0800) FiO2 (%):  [30 %] 30 % (03/25 0800) Weight:  [71.7 kg] 71.7 kg (03/25 0500)  General - Well nourished, well developed, intubated on fentanyl.  Ophthalmologic - fundi not visualized due to noncooperation.  Cardiovascular - Regular rhythm but tachycardia.  Neuro - intubated on fentanyl, eyes closed but easily open on voice, able to follow eye close open and right finger and toe movement as requested, but slow response and needed repetitive request. Eyes mid position, doll's eyes present, not tracking, pupils 79mm  and sluggish, blinking to visual threat on the left but not on the right. Corneal reflex + bilaterally, gag and cough present. Breathing over the vent.  Facial symmetry not able to test due to ET tube.  Tongue protrusion not cooperative. No movement of LUE and LLE even with pain. However, RUE slight withdraw and finger slight movement on command, RLE toe movement on request but mild withdraw to pain. DTR 1+ and toes equiv. Sensation, coordination and gait not tested.  ASSESSMENT/PLAN [name] is a 59 y.o. female with history of ICH 7 yrs ago, HTN found unresponsive. Found to have L sided hemiplegia, R gaze, SBP 250.  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 Moderate IVH. Mass effect w/ partial effacment R lateral and 3rd ventricles. 29mm L midline shift. Suspicious for early hydrocephalus.   Repeat CT head 3/16 interval L frontal lobe EVD w/ tip at R thalamus. Interval increased in R ICH now w/ 71mm L midline shift. IVH extended into B lateral, 3rd and 4th ventricles. Prominent B superior ophthalmic vein suggestion increased ICP.   CT repeat 3/17 - stable MLS 69mm, hematoma and hydrocephalus  CT repeat 3/20 - stable hematoma and edeam. Decreased hydrocephalus.   CT repeat 3/25 evolution R thalamic ICH w/ decreasing clot. Increased ventricular volume s/p EVD removal. 1cm midline shift. Sinusitis.  CT repeat 3/26 pending   2D Echo EF 60-65%.No arterial finding for bleed . Severe LVH. 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   Cytotoxic Cerebral Edema Obstructive  hydrocephalus Induced Hypernatremia  EVD placed 3/15 [name])  CT 3/16 - hematoma mild enlargement  CT 3/17 - stable hematoma and MLS  CT 3/20 - stable hematoma and edema with decreased hydrocephalus  CT repeat 3/25 evolution R thalamic ICH w/ decreasing clot. Increased ventricular volume s/p EVD removal. 1cm midline shift.   CT repeat 3/26 pending  Off 3% now, on NS @ 25  Na 134->...->159->154->151->152->155-<155->152->150  EVD removed 3/24  Acute Respiratory Failure d/t ICH Probable Aspiration PNA Fever   Intubated in ED  T-max 101.7->99.5->101.2->100.7->99.4->100.5->101.4->afebrile  CCM on board  Sedation, off  propofol, on fentanyl  Pneumonia - (ceftriaxone and metronidazole 3/16>>3/20)  Cefepime 07/23/19>>07/27/19 (EVD out)  CXR 3/24 Pulmonary vascular congestion and mild interstitial edema.  UA neg; UCx pending   Blood culture no growth < 24h  Trach planned Saturday  Hypertensive Emergency  SBP > 250 on arrival  Home meds:  Coreg, HCTZ and losartan  off Cleviprex gtt now . SBP goal <160 . On losartan 50 bid and Coreg 25 bid and amlodipine 10 and catapres 0.1->0.2 tid . Long-term BP goal normotensive  Dysphagia . Secondary to stroke . NPO . Cortrak . On tube feeds @ 45 . On IVF @ 25  Speech to see once exutbated   Other Stroke Risk Factors  Obesity, Body mass index is 30.87 kg/m., recommend weight loss, diet and exercise as appropriate   Hx stroke/TIA  ICH 7 yrs ago. No data available in EPIC  obstructive sleep apnea  Other Active Problems  Hypokalemia 3.2->5.6->4.1->3.0 -> 3.4->2.9->2.8->2.7->3.8->3.9->3.8->3.2 - supplement  Mild anemia of critical illness - Hb - 11.3 ->11.4->10.2->10.0->9.9  Hospital day # 10  This patient is critically ill due to ICH, IVH, hydrocephalus status post EVD, hypertensive emergency and at significant risk of neurological worsening, death form hematoma expansion, obstructive hydrocephalus, cerebral  edema, brain herniation. This patient's care requires constant monitoring of vital signs, hemodynamics, respiratory and cardiac monitoring, review of multiple databases, neurological assessment, discussion with family, other specialists and medical decision making of high complexity. I spent 35 minutes of neurocritical care time in the care of this patient. I had long discussion with one daughter at bedside and one daughter over the phone, updated pt current condition, treatment plan and potential prognosis, and answered all the questions. They expressed understanding and appreciation.     Marvel Plan, MD PhD Stroke Neurology 07/28/2019 9:02 AM   To contact Stroke Continuity provider, please refer to [email or web address]. After hours, contact General Neurology

## 2019-07-28 NOTE — Progress Notes (Signed)
NAME:  Haley Hernandez, MRN:  [id number], DOB:  28-Jun-1960, LOS: 25 ADMISSION DATE:  07/18/2019, CONSULTATION DATE:  07/19/2019 REFERRING MD:  Dr. Lorraine Lax, CHIEF COMPLAINT:  ICH  Brief History   59 year old female with prior history ICH 7 yrs ago and HTN found unresponsive at home found to left hemiplegia and right gaze with SBP 250.  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  Intubated in ER for airway protection.  NSGY consulted and EVD placed.  Placed on cleviprex for blood pressure control and admitted to Neurology.  PCCM consulted for ventilator management.   Past Medical History  HTN OSA ICH (?2014)  Significant Hospital Events   3/15 Admitted, ventriculostomy placed 3/23 ventriculostomy removed 3/24 Fever 101F  Consults:  NSGY  Procedures:  3/15 ETT >> 3/15 EVD >> 3/16 PICC >>   Significant Diagnostic Tests:  3/15 CT head >> 3.2 x 2.9 x 4.2 cm ICH in posterior Rt BG and Rt thalamus, mod IV extension, 4 mm Lt shift 3/16 Echo >> EF 60 to 65%, severe concentric LVH 3/17 CT head >> 7 mm shift 3/17 EEG >> generalized slowing 3/20 CT head >> decreased hydrocephalus 3/24 CT head >>> 1. Expected evolution of right thalamic hematoma with decreasing intraventricular clot. 2. Increased ventricular volume after ventriculostomy removal. 3. 1 cm of midline shift. 4. Sinusitis   Micro Data:  SARS CoV2 PCR 3/15 >> negative Sputum 3/17 >> negative Sputum 3/20 >> negative Blood 3/20 >>  ID/ cultures/data   Rocephin 3/16 >> 3/19 Flagyl 3/16 >> 3/20 Cefepime 3/20 >>   Interim history/subjective:  Tol PS 8/5.  Mental status unchanged. Repeat CT head as above.  Off cleviprex.   Objective   Blood pressure (!) 145/89, pulse 75, temperature 99.4 F (37.4 C), temperature source Axillary, resp. rate 15, height 5' (1.524 m), weight 71.7 kg, SpO2 100 %.    Vent Mode: PSV;CPAP FiO2 (%):  [30 %] 30 % Set Rate:  [18 bmp]  18 bmp Vt Set:  [360 mL] 360 mL PEEP:  [5 cmH20] 5 cmH20 Pressure Support:  [8 cmH20] 8 cmH20 Plateau Pressure:  [10 cmH20-15 cmH20] 10 cmH20   Intake/Output Summary (Last 24 hours) at 07/28/2019 0849 Last data filed at 07/28/2019 0800 Gross per 24 hour  Intake 2548.7 ml  Output 1850 ml  Net 698.7 ml   Filed Weights   07/25/19 0436 07/26/19 0500 07/28/19 0500  Weight: 71 kg 71.5 kg 71.7 kg    General - NAD on vent Eyes - disconjugate gaze, pupils reactive  ENT - mm moist, ETT, large protruding tongue  Cardiac - s1s2 rrr Chest - resps even non labored on PS wean, diminished throughout  Abdomen - soft, non tender, + bowel sounds Extremities - no cyanosis, clubbing, or edema Skin - no rashes Neuro - sedated on low dose fentanyl, RASS -2    Resolved Hospital Problem list     Assessment & Plan:   Acute hypoxic respiratory failure with compromised airway. Likely also has OSA. 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 - per neurology and neurosurgery - stroke team primary  Aspiration pneumonia - continue cefepime D5  Hyperglycemia. - SSI  Anemia of critical illness. - f/u CBC - transfuse for Hb < 7 or significant bleeding  Best practice:  Diet: TF DVT prophylaxis: SCDs GI prophylaxis: Pepcid Mobility: BR Code Status: Full    Labs:   CMP Latest Ref Rng & Units 07/28/2019 07/27/2019 07/26/2019  Glucose 70 - 99 mg/dL 314(H) 702(O) 378(H)  BUN 6 - 20 mg/dL 88(F) 02(D) 74(J)  Creatinine 0.44 - 1.00 mg/dL 2.87 8.67 6.72  Sodium 135 - 145 mmol/L 150(H) 152(H) 155(H)  Potassium 3.5 - 5.1 mmol/L 3.2(L) 3.8 3.9  Chloride 98 - 111 mmol/L 117(H) 121(H) 125(H)  CO2 22 - 32 mmol/L 25 25  26   Calcium 8.9 - 10.3 mg/dL 10.4(H) 10.3 10.1  Total Protein 6.5 - 8.1 g/dL - 6.6 -  Total Bilirubin 0.3 - 1.2 mg/dL - 0.6 -  Alkaline Phos 38 - 126 U/L - 31(L) -  AST 15 - 41 U/L - 15 -  ALT 0 - 44 U/L - 18 -    CBC Latest Ref Rng & Units 07/28/2019 07/27/2019 07/26/2019  WBC 4.0 - 10.5 K/uL 10.3 9.3 7.7  Hemoglobin 12.0 - 15.0 g/dL 07/28/2019) 10.0(L) 10.2(L)  Hematocrit 36.0 - 46.0 % 33.1(L) 33.0(L) 32.4(L)  Platelets 150 - 400 K/uL 216 184 162    ABG    Component Value Date/Time   PHART 7.362 07/19/2019 0105   PCO2ART 50.3 (H) 07/19/2019 0105   PO2ART 90.0 07/19/2019 0105   HCO3 28.7 (H) 07/19/2019 0105   TCO2 30 07/19/2019 0105   O2SAT 97.0 07/19/2019 0105    CBG (last 3)  Recent Labs    07/27/19 2306 07/28/19 0307 07/28/19 0730  GLUCAP 185* 159* 128*     CC time 31 minutes  07/30/19, NP Pulmonary/Critical Care Medicine  07/28/2019  8:49 AM

## 2019-07-28 NOTE — Progress Notes (Signed)
Patient transported to and from CT without complications.  

## 2019-07-28 NOTE — Progress Notes (Signed)
PT Cancellation Note  Patient Details Name: Haley Hernandez MRN: [id number] DOB: 09-07-1960   Cancelled Treatment:    Reason Eval/Treat Not Completed: Fatigue/lethargy limiting ability to participate;Patient not medically ready. Per discussion with RN patient non-responsive at this time, unable to participate in skilled PT intervention at this time.   Arlyss Gandy 07/28/2019, 4:43 PM

## 2019-07-29 ENCOUNTER — Inpatient Hospital Stay (HOSPITAL_COMMUNITY): Payer: 59

## 2019-07-29 LAB — MAGNESIUM: Magnesium: 2 mg/dL (ref 1.7–2.4)

## 2019-07-29 LAB — BASIC METABOLIC PANEL
Anion gap: 10 (ref 5–15)
BUN: 20 mg/dL (ref 6–20)
CO2: 27 mmol/L (ref 22–32)
Calcium: 10.2 mg/dL (ref 8.9–10.3)
Chloride: 111 mmol/L (ref 98–111)
Creatinine, Ser: 0.5 mg/dL (ref 0.44–1.00)
GFR calc Af Amer: >60 mL/min (ref >60)
GFR calc non Af Amer: >60 mL/min (ref >60)
Glucose, Bld: 185 mg/dL — ABNORMAL HIGH (ref 70–99)
Potassium: 3.5 mmol/L (ref 3.5–5.1)
Sodium: 148 mmol/L — ABNORMAL HIGH (ref 135–145)

## 2019-07-29 LAB — GLUCOSE, CAPILLARY
Glucose-Capillary: 145 mg/dL — ABNORMAL HIGH (ref 70–99)
Glucose-Capillary: 153 mg/dL — ABNORMAL HIGH (ref 70–99)
Glucose-Capillary: 169 mg/dL — ABNORMAL HIGH (ref 70–99)
Glucose-Capillary: 146 mg/dL — ABNORMAL HIGH (ref 70–99)
Glucose-Capillary: 157 mg/dL — ABNORMAL HIGH (ref 70–99)
Glucose-Capillary: 170 mg/dL — ABNORMAL HIGH (ref 70–99)

## 2019-07-29 LAB — CBC
HCT: 30.9 % — ABNORMAL LOW (ref 36.0–46.0)
Hemoglobin: 9.3 g/dL — ABNORMAL LOW (ref 12.0–15.0)
MCH: 27.8 pg (ref 26.0–34.0)
MCHC: 30.1 g/dL (ref 30.0–36.0)
MCV: 92.2 fL (ref 80.0–100.0)
Platelets: 212 10*3/uL (ref 150–400)
RBC: 3.35 MIL/uL — ABNORMAL LOW (ref 3.87–5.11)
RDW: 15.1 % (ref 11.5–15.5)
WBC: 9.7 10*3/uL (ref 4.0–10.5)
nRBC: 0 % (ref 0.0–0.2)

## 2019-07-29 MED ORDER — LABETALOL HCL 5 MG/ML IV SOLN
10.0000 mg | INTRAVENOUS | Status: DC | PRN
  Administered 2019-07-29 (×3): 20 mg via INTRAVENOUS
  Administered 2019-07-30: 40 mg via INTRAVENOUS
  Administered 2019-07-30: 20 mg via INTRAVENOUS
  Administered 2019-07-30: 40 mg via INTRAVENOUS
  Administered 2019-07-31: 10 mg via INTRAVENOUS
  Administered 2019-07-31: 40 mg via INTRAVENOUS
  Administered 2019-07-31: 10 mg via INTRAVENOUS
  Administered 2019-07-31: 20 mg via INTRAVENOUS
  Administered 2019-07-31: 40 mg via INTRAVENOUS
  Administered 2019-08-01 (×2): 20 mg via INTRAVENOUS
  Filled 2019-07-29 (×2): qty 4
  Filled 2019-07-29 (×2): qty 8
  Filled 2019-07-29: qty 4
  Filled 2019-07-29 (×2): qty 8
  Filled 2019-07-29: qty 4

## 2019-07-29 NOTE — Evaluation (Signed)
Physical Therapy Evaluation Patient Details Name: Haley Hernandez MRN: [id number] DOB: 05-13-1960 Today's Date: 07/29/2019   History of Present Illness  59 y.o. female with past medical history of intracerebral hemorrhage 7 years ago, hypertension presents to the emergency department after family found her unresponsive with L hemipleiga and R gaze deviation. 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 Pt intubated and with EVD placed on 3/15. EVD removed 3/24.  Clinical Impression  Pt presents with significant deficits in functional mobility, strength, power, balance, endurance, vision, communication, motor control. Pt flaccid on L side with profound weakness noted in R side. Pt demonstrating limited ability to follow motor commands with use of RLE at this time, no AROM noted in RUE at this time. Pt requiring significant assistance for all functional mobility at this time and will benefit from use of lift for any attempted transfers at this time. Pt will continue to benefit from acute PT POC to improve strength and functional mobility, and to also reduce caregiver burden.    Follow Up Recommendations SNF;LTACH;Supervision/Assistance - 24 hour(SNF vs LTACH pending medical need)    Equipment Recommendations  (TBD)    Recommendations for Other Services       Precautions / Restrictions Precautions Precautions: Fall;Other (comment) Precaution Comments: SBP<160, HOB>30 degrees Restrictions Weight Bearing Restrictions: No      Mobility  Bed Mobility Overal bed mobility: Needs Assistance Bed Mobility: Rolling;Sidelying to Sit;Sit to Sidelying Rolling: Total assist;+2 for physical assistance Sidelying to sit: Total assist;+2 for physical assistance     Sit to sidelying: Total assist;+2 for physical assistance    Transfers                    Ambulation/Gait                Stairs            Wheelchair  Mobility    Modified Rankin (Stroke Patients Only) Modified Rankin (Stroke Patients Only) Pre-Morbid Rankin Score: (unable to determine at this time) Modified Rankin: Severe disability     Balance Overall balance assessment: Needs assistance Sitting-balance support: Bilateral upper extremity supported;Feet unsupported Sitting balance-[name]: Zero Sitting balance - Comments: total A, posterior lean Postural control: Posterior lean                                   Pertinent Vitals/Pain Pain Assessment: Faces Faces Pain Scale: Hurts even more Pain Location: generalized Pain Descriptors / Indicators: Grimacing Pain Intervention(s): Monitored during session    Home Living Family/patient expects to be discharged to:: Unsure                 Additional Comments: unable to obtain history, pt intubated and unable to meaningfully communicate with PT this session, no family present    Prior Function Level of Independence: (unable to obtain history)               Hand Dominance        Extremity/Trunk Assessment   Upper Extremity Assessment Upper Extremity Assessment: Defer to OT evaluation    Lower Extremity Assessment Lower Extremity Assessment: RLE deficits/detail;LLE deficits/detail RLE Deficits / Details: extensor tone in knee and adductors, able to actively extend knee minimally with assistance of gravity and PT cues. Flicker of muscle activation to painful stimuli in R great toe and quadriceps RLE Sensation: (limited response to pain) LLE  Deficits / Details: flaccid, PROM WFL, no response to pain    Cervical / Trunk Assessment Cervical / Trunk Assessment: Other exceptions Cervical / Trunk Exceptions: neck rotated to left and in flexion at rest. PT able to passively rotate neck to midline and extend to neurtal while sitting however pt unable to maintain position and returning to flexed neck with L lateral rotation.  Communication    Communication: Other (comment)(intubated)  Cognition Arousal/Alertness: Lethargic(awakens with verbal stimulation) Behavior During Therapy: Flat affect Overall Cognitive Status: Difficult to assess                                 General Comments: intubated, pt with limited and inconsistent ability to follow verbal commands, more success with visual commands and gestures however still inconsistent      General Comments General comments (skin integrity, edema, etc.): pt with dysconjugate gaze, L gaze preference with both eyes. Pt with L and downward gaze deviation at rest, able to track back to midline with visual and verbal cues.    Exercises     Assessment/Plan    PT Assessment Patient needs continued PT services  PT Problem List Decreased strength;Decreased activity tolerance;Decreased balance;Decreased mobility;Decreased knowledge of use of DME;Decreased safety awareness;Decreased cognition;Decreased coordination;Decreased knowledge of precautions;Impaired sensation;Impaired tone       PT Treatment Interventions DME instruction;Gait training;Functional mobility training;Therapeutic activities;Therapeutic exercise;Balance training;Neuromuscular re-education;Patient/family education    PT Goals (Current goals can be found in the Care Plan section)  Acute Rehab PT Goals Patient Stated Goal: Pt unable to participate in goal setting, to improve strength and reduce caregiver burden PT Goal Formulation: Patient unable to participate in goal setting Time For Goal Achievement: 08/12/19 Potential to Achieve Goals: Poor    Frequency Min 3X/week   Barriers to discharge        Co-evaluation PT/OT/SLP Co-Evaluation/Treatment: Yes Reason for Co-Treatment: Complexity of the patient's impairments (multi-system involvement);Necessary to address cognition/behavior during functional activity;For patient/therapist safety PT goals addressed during session: Mobility/safety with  mobility;Balance;Strengthening/ROM         AM-PAC PT "6 Clicks" Mobility  Outcome Measure Help needed turning from your back to your side while in a flat bed without using bedrails?: Total Help needed moving from lying on your back to sitting on the side of a flat bed without using bedrails?: Total Help needed moving to and from a bed to a chair (including a wheelchair)?: Total Help needed standing up from a chair using your arms (e.g., wheelchair or bedside chair)?: Total Help needed to walk in hospital room?: Total Help needed climbing 3-5 steps with a railing? : Total 6 Click Score: 6    End of Session Equipment Utilized During Treatment: Oxygen Activity Tolerance: Patient tolerated treatment well Patient left: in bed;with call bell/phone within reach;with bed alarm set Nurse Communication: Mobility status;Need for lift equipment PT Visit Diagnosis: Other symptoms and signs involving the nervous system (R29.898);Muscle weakness (generalized) (M62.81);Hemiplegia and hemiparesis Hemiplegia - Right/Left: Left Hemiplegia - caused by: Nontraumatic intracerebral hemorrhage    Time: 0856-0921 PT Time Calculation (min) (ACUTE ONLY): 25 min   Charges:   PT Evaluation $PT Eval High Complexity: 1 High          Arlyss Gandy, PT, DPT Acute Rehabilitation Pager: [phone]   Arlyss Gandy 07/29/2019, 9:46 AM

## 2019-07-29 NOTE — Progress Notes (Signed)
Patient transported to and from CT without complications.  

## 2019-07-29 NOTE — Progress Notes (Signed)
Patient not following commands and minimally withdraws to pain on RUE w/small flicker. Fentanyl gtt off since 1505. Family at bedside asking for an update from MD. Notified Dr. Nicholas Lose regarding neuro exam and family request. MD aware and stated he would return to speak with family later in the afternoon.

## 2019-07-29 NOTE — Evaluation (Signed)
Occupational Therapy Evaluation Patient Details Name: Haley Hernandez MRN: [id number] DOB: [birth year] Today's Date: 07/29/2019    History of Present Illness 59 y.o. female with past medical history of intracerebral hemorrhage 7 years ago, hypertension presents to the emergency department after family found her unresponsive with L hemipleiga and R gaze deviation. 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 Pt intubated and with EVD placed on 3/15. EVD removed 3/24.   Clinical Impression   Upon arrival, pt awake and supine in bed on the vent. Unable to collect home information and PLOF due to limited communication with intubation. Pt requiring Total A for ADLs and bed mobility. Pt presenting with decreased following of commands, left gaze preference and head turn, flaccid on L side and weakness at RUE/RLE, poor balance, and limited activity tolerance. Pt sitting at EOB with Total A for support. BP elevated while at EOB (see general comments); RN notified. Pt would benefit from further acute OT to facilitate safe dc. Recommend dc to SNF for further OT to optimize safety, independence with ADLs, and return to PLOF.      Follow Up Recommendations  SNF;Supervision/Assistance - 24 hour    Equipment Recommendations  Other (comment)(Defer to next venue)    Recommendations for Other Services PT consult     Precautions / Restrictions Precautions Precautions: Fall;Other (comment) Precaution Comments: SBP<160, HOB>30 degrees Restrictions Weight Bearing Restrictions: No      Mobility Bed Mobility Overal bed mobility: Needs Assistance Bed Mobility: Rolling;Sidelying to Sit;Sit to Sidelying Rolling: Total assist;+2 for physical assistance Sidelying to sit: Total assist;+2 for physical assistance     Sit to sidelying: Total assist;+2 for physical assistance General bed mobility comments: Total A +2 for trunk control and manading  BLEs.  Transfers                      Balance Overall balance assessment: Needs assistance Sitting-balance support: Bilateral upper extremity supported;Feet unsupported Sitting balance-[name]: Zero Sitting balance - Comments: total A, posterior lean Postural control: Posterior lean                                 ADL either performed or assessed with clinical judgement   ADL Overall ADL's : Needs assistance/impaired                                       General ADL Comments: Pt requiring Total A for ADLs and bed mobility     Vision   Vision Assessment?: Vision impaired- to be further tested in functional context;Yes Eye Alignment: Impaired (comment)(Left eye deviation to left and down) Ocular Range of Motion: Other (comment)(Pt looking to left; able to maintain right eye at midline when covering left eye) Alignment/Gaze Preference: Gaze left;Head tilt Tracking/Visual Pursuits: Unable to hold eye position out of midline;Impaired - to be further tested in functional context Diplopia Assessment: Other (comment)(Dysconjugate gaze. Will continue to assess) Additional Comments: Pt with dysconjuagte gaze and left eye deviating downward and to left. Tendency for left gaze and head turn to left. Covering left eye and pt able to maintain gaze at midline. Will continue to assess     Perception     Praxis      Pertinent Vitals/Pain Pain Assessment: Faces Faces Pain Scale: Hurts even more Pain Location:  generalized Pain Descriptors / Indicators: Grimacing Pain Intervention(s): Monitored during session     Hand Dominance     Extremity/Trunk Assessment Upper Extremity Assessment Upper Extremity Assessment: RUE deficits/detail;LUE deficits/detail RUE Deficits / Details: No active movement. WFL for PROM.  RUE Coordination: decreased fine motor;decreased gross motor LUE Deficits / Details: No active movement. WFL for PROM.  LUE  Coordination: decreased fine motor;decreased gross motor   Lower Extremity Assessment Lower Extremity Assessment: Defer to PT evaluation RLE Deficits / Details: extensor tone in knee and adductors, able to actively extend knee minimally with assistance of gravity and PT cues. Flicker of muscle activation to painful stimuli in R great toe and quadriceps RLE Sensation: (limited response to pain) LLE Deficits / Details: flaccid, PROM WFL, no response to pain   Cervical / Trunk Assessment Cervical / Trunk Assessment: Other exceptions Cervical / Trunk Exceptions: neck rotated to left and in flexion at rest. passively rotate neck to midline and extend to neurtal while sitting however pt unable to maintain position and returning to flexed neck with L lateral rotation.   Communication Communication Communication: Other (comment)(Intubated)   Cognition Arousal/Alertness: Lethargic(awakens with verbal stimulation) Behavior During Therapy: Flat affect Overall Cognitive Status: Difficult to assess                                 General Comments: intubated, pt with limited and inconsistent ability to follow verbal commands, more success with visual commands and gestures however still inconsistent   General Comments  BP sitting at EOB 195/132. BP back in supine 174/93. supine for several minutes 160/94. RN notified.     Exercises     Shoulder Instructions      Home Living Family/patient expects to be discharged to:: Unsure                                 Additional Comments: unable to obtain history, pt intubated and unable to meaningfully communicate. no family present      Prior Functioning/Environment Level of Independence: (unable to obtain history)        Comments: Unable to obtain due to intubated        OT Problem List: Decreased strength;Decreased range of motion;Decreased activity tolerance;Impaired balance (sitting and/or standing);Impaired  vision/perception;Decreased coordination;Decreased cognition;Decreased knowledge of use of DME or AE;Decreased safety awareness;Decreased knowledge of precautions      OT Treatment/Interventions: Self-care/ADL training;Therapeutic exercise;Energy conservation;DME and/or AE instruction;Therapeutic activities;Patient/family education    OT Goals(Current goals can be found in the care plan section) Acute Rehab OT Goals Patient Stated Goal: Pt unable to participate in goal setting, to improve strength and reduce caregiver burden OT Goal Formulation: Patient unable to participate in goal setting Time For Goal Achievement: 08/12/19 Potential to Achieve Goals: Good  OT Frequency: Min 2X/week   Barriers to D/C:            Co-evaluation PT/OT/SLP Co-Evaluation/Treatment: Yes Reason for Co-Treatment: For patient/therapist safety;To address functional/ADL transfers;Necessary to address cognition/behavior during functional activity;Complexity of the patient's impairments (multi-system involvement) PT goals addressed during session: Mobility/safety with mobility;Balance;Strengthening/ROM OT goals addressed during session: ADL's and self-care      AM-PAC OT "6 Clicks" Daily Activity     Outcome Measure Help from another person eating meals?: Total Help from another person taking care of personal grooming?: Total Help from another person toileting, which includes  using toliet, bedpan, or urinal?: Total Help from another person bathing (including washing, rinsing, drying)?: Total Help from another person to put on and taking off regular upper body clothing?: Total Help from another person to put on and taking off regular lower body clothing?: Total 6 Click Score: 6   End of Session Equipment Utilized During Treatment: Other (comment)(Vent) Nurse Communication: Mobility status  Activity Tolerance: Patient tolerated treatment well Patient left: in bed;with call bell/phone within reach;with  family/visitor present  OT Visit Diagnosis: Unsteadiness on feet (R26.81);Other abnormalities of gait and mobility (R26.89);Muscle weakness (generalized) (M62.81);Pain;Low vision, both eyes (H54.2)                Time: 1025-8527 OT Time Calculation (min): 25 min Charges:  OT General Charges $OT Visit: 1 Visit OT Evaluation $OT Eval High Complexity: 1 High  Damyra Luscher MSOT, OTR/L Acute Rehab Pager: [phone] Office: [place] 07/29/2019, 10:20 AM

## 2019-07-29 NOTE — Progress Notes (Signed)
NAME:  Haley Hernandez, MRN:  [id number], DOB:  1960-11-17, LOS: 44 ADMISSION DATE:  07/18/2019, CONSULTATION DATE:  07/19/2019 REFERRING MD:  Dr. Lorraine Lax, CHIEF COMPLAINT:  ICH  Brief History   59 year old female with prior history ICH 7 yrs ago and HTN found unresponsive at home found to left hemiplegia and right gaze with SBP 250.  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  Intubated in ER for airway protection.  NSGY consulted and EVD placed.  Placed on cleviprex for blood pressure control and admitted to Neurology.  PCCM consulted for ventilator management.   Past Medical History  HTN OSA ICH (?2014)  Significant Hospital Events   3/15 Admitted, ventriculostomy placed 3/23 ventriculostomy removed 3/24 Fever 101F  Consults:  NSGY  Procedures:  3/15 ETT >> 3/15 EVD >> 3/16 PICC >>   Significant Diagnostic Tests:  3/15 CT head >> 3.2 x 2.9 x 4.2 cm ICH in posterior Rt BG and Rt thalamus, mod IV extension, 4 mm Lt shift 3/16 Echo >> EF 60 to 65%, severe concentric LVH 3/17 CT head >> 7 mm shift 3/17 EEG >> generalized slowing 3/20 CT head >> decreased hydrocephalus 3/24 CT head >>> 1. Expected evolution of right thalamic hematoma with decreasing intraventricular clot. 2. Increased ventricular volume after ventriculostomy removal. 3. 1 cm of midline shift. 4. Sinusitis   Micro Data:  SARS CoV2 PCR 3/15 >> negative Sputum 3/17 >> negative Sputum 3/20 >> negative Blood 3/20 >>  ID/ cultures/data   Rocephin 3/16 >> 3/19 Flagyl 3/16 >> 3/20 Cefepime 3/20 >>   Interim history/subjective:  On vent.  Objective   Blood pressure 138/87, pulse 76, temperature (!) 101.3 F (38.5 C), temperature source Oral, resp. rate 18, height 5' (1.524 m), weight 71 kg, SpO2 97 %.    Vent Mode: PRVC FiO2 (%):  [30 %] 30 % Set Rate:  [18 bmp] 18 bmp Vt Set:  [360 mL] 360 mL PEEP:  [5 cmH20] 5 cmH20 Pressure Support:  [8  cmH20] 8 cmH20 Plateau Pressure:  [11 cmH20-13 cmH20] 11 cmH20   Intake/Output Summary (Last 24 hours) at 07/29/2019 0731 Last data filed at 07/29/2019 0700 Gross per 24 hour  Intake 1692.77 ml  Output 1450 ml  Net 242.77 ml   Filed Weights   07/26/19 0500 07/28/19 0500 07/29/19 0500  Weight: 71.5 kg 71.7 kg 71 kg     General - on vent Eyes - pupils reactive ENT - protuberant tongue Cardiac - regular rate/rhythm, no murmur Chest - equal breath sounds b/l, no wheezing or rales Abdomen - soft, non tender, + bowel sounds Extremities - 1+ edema Skin - no rashes Neuro - not following commands  Resolved Hospital Problem list     Assessment & Plan:   Acute hypoxic respiratory failure with compromised airway. Likely also has OSA. - will need trach  HTN emergency - improved  - goal SBP < 160 - continue norvasc, coreg, catapres, cozaar  ICH 2nd to HTN complicated by cytotoxic cerebral edema, obstructive hydrocephalus. - per stroke team, neurosurgery  Aspiration pneumonia - day 6/7 of cefepime  Hyperglycemia. - SSI  Anemia of critical illness. - f/u CBC - transfuse for Hb < 7 or significant bleeding  Best practice:  Diet: TF DVT prophylaxis: SCDs GI prophylaxis: Pepcid Mobility: BR Code Status: Full    Labs:   CMP Latest Ref Rng & Units 07/29/2019 07/28/2019 07/27/2019  Glucose 70 - 99  mg/dL 355(H) 741(U) 384(T)  BUN 6 - 20 mg/dL 20 36(I) 68(E)  Creatinine 0.44 - 1.00 mg/dL 3.21 2.24 8.25  Sodium 135 - 145 mmol/L 148(H) 150(H) 152(H)  Potassium 3.5 - 5.1 mmol/L 3.5 3.2(L) 3.8  Chloride 98 - 111 mmol/L 111 117(H) 121(H)  CO2 22 - 32 mmol/L 27 25 25   Calcium 8.9 - 10.3 mg/dL 10.4(H) 10.3  Total Protein 6.5 - 8.1 g/dL - - 6.6  Total Bilirubin 0.3 - 1.2 mg/dL - - 0.6  Alkaline Phos 38 - 126 U/L - - 31(L)  AST 15 - 41 U/L - - 15  ALT 0 - 44 U/L - - 18    CBC Latest Ref Rng & Units 07/29/2019 07/28/2019 07/27/2019  WBC 4.0 - 10.5 K/uL 9.7 10.3 9.3   Hemoglobin 12.0 - 15.0 g/dL 07/29/2019) 7.0(W) 10.0(L)  Hematocrit 36.0 - 46.0 % 30.9(L) 33.1(L) 33.0(L)  Platelets 150 - 400 K/uL 212 216 184    ABG    Component Value Date/Time   PHART 7.362 07/19/2019 0105   PCO2ART 50.3 (H) 07/19/2019 0105   PO2ART 90.0 07/19/2019 0105   HCO3 28.7 (H) 07/19/2019 0105   TCO2 30 07/19/2019 0105   O2SAT 97.0 07/19/2019 0105    CBG (last 3)  Recent Labs    07/28/19 2323 07/29/19 0312 07/29/19 0729  GLUCAP 150* 145* 153*   07/31/19, MD [facility] 07/29/2019, 7:33 AM

## 2019-07-29 NOTE — Progress Notes (Signed)
MD at bedside updating family. Notified of fever not responding to tylenol. Gave verbal orders for blood cx, sputum cx and urine cx

## 2019-07-29 NOTE — Progress Notes (Signed)
STROKE TEAM PROGRESS NOTE   INTERVAL HISTORY" I reviewed CT Brain from this morning.  There is stable hemorrhage in the right BG and thalamus with about 2 mm right to left shift and mild hydrocephalus.  The latter is stable vs yesterday.  Left frontal EVD was removed.    She is currently off Cleviprex but SBP is in 170's.  Goal SBP <160.   Vitals:   07/29/19 0400 07/29/19 0500 07/29/19 0600 07/29/19 0700  BP: 137/87 (!) 167/102 (!) 149/98 138/87  Pulse: 84 80 80 76  Resp: (!) 23 18 19 18   Temp: (!) 101.3 F (38.5 C)     TempSrc: Oral     SpO2: 98% 95% 96% 97%  Weight:  71 kg    Height:       CBC:  Recent Labs  Lab 07/28/19 0509 07/29/19 0525  WBC 10.3 9.7  HGB 9.9* 9.3*  HCT 33.1* 30.9*  MCV 94.0 92.2  PLT 216 212   Basic Metabolic Panel:  Recent Labs  Lab 07/24/19 0500 07/24/19 0835 07/25/19 0212 07/25/19 0939 07/28/19 0509 07/29/19 0525  NA 150*   < > 159*   < > 150* 148*  K 2.8*  --   --    < > 3.2* 3.5  CL 125*  --   --    < > 117* 111  CO2 21*  --   --    < > 25 27  GLUCOSE 117*  --   --    < > 147* 185*  BUN 15  --   --    < > 25* 20  CREATININE <0.30*  --   --    < > 0.61 0.50  CALCIUM 8.0*  --   --    < > 10.4* 10.2  MG 2.1   < > 1.7   < > 2.1 2.0  PHOS 1.9*  --  2.6  --   --   --    < > = values in this interval not displayed.   Lipid Panel:     Component Value Date/Time   CHOL 180 07/20/2019 0556   TRIG 272 (H) 07/28/2019 0509   HDL 45 07/20/2019 0556   CHOLHDL 4.0 07/20/2019 0556   VLDL 72 (H) 07/20/2019 0556   LDLCALC 63 07/20/2019 0556   HgbA1c:  Lab Results  Component Value Date   HGBA1C 5.7 (H) 07/19/2019   Urine Drug Screen:     Component Value Date/Time   LABOPIA NONE DETECTED 07/19/2019 0220   COCAINSCRNUR NONE DETECTED 07/19/2019 0220   LABBENZ NONE DETECTED 07/19/2019 0220   AMPHETMU NONE DETECTED 07/19/2019 0220   THCU NONE DETECTED 07/19/2019 0220   LABBARB NONE DETECTED 07/19/2019 0220   IMAGING past 24 hours CT HEAD  WO CONTRAST  Result Date: 07/29/2019 CLINICAL DATA:  Follow-up cerebral hemorrhage EXAM: CT HEAD WITHOUT CONTRAST TECHNIQUE: Contiguous axial images were obtained from the base of the skull through the vertex without intravenous contrast. COMPARISON:  Yesterday FINDINGS: Brain: Unchanged size of the right thalamic hematoma with vague periphery related to early reabsorption. Vasogenic edema is also unchanged. Intraventricular clot is unchanged, mainly seen layering at the occipital horns of the lateral ventricles. Lateral ventriculomegaly is slightly increased from yesterday, when measured at the caudate heads the diameter is 38 mm as compared to 36 mm yesterday. Unchanged midline shift of 1 cm. No complicating infarct. Stable appearance of left frontal ventriculostomy tract. Vascular: No hyperdense vessel or unexpected calcification. Skull:  Stable Sinuses/Orbits: Partial sinus and mastoid opacification in the setting of intubation. IMPRESSION: 1. Stable volume of right thalamic and intraventricular hemorrhage. 2. Ventriculomegaly with 2 mm of increased lateral ventricular diameter when compared yesterday. Electronically Signed   By: Marnee Spring M.D.   On: 07/29/2019 04:30   DG Chest Port 1 View  Result Date: 07/29/2019 CLINICAL DATA:  Cerebral hemorrhage.  Respiratory failure. EXAM: PORTABLE CHEST 1 VIEW COMPARISON:  07/27/2019. FINDINGS: Endotracheal tube in stable position. Interim removal of NG tube and placement of feeding tube, its tip is below left hemidiaphragm. Right PICC line stable position. Cardiomegaly. Left lower lobe atelectasis/infiltrate. Small left pleural effusion again noted. No pneumothorax. IMPRESSION: 1. Interim removal of NG tube and placement of feeding tube. Feeding tube tip is below left hemidiaphragm. Endotracheal tube in stable position. 2.  Cardiomegaly.  No pulmonary venous congestion. 3. Left lower lobe atelectasis/infiltrate. Small left pleural effusion again noted.  Electronically Signed   By: Maisie Fus  Register   On: 07/29/2019 06:28    PHYSICAL EXAM  Temp:  [98.4 F (36.9 C)-101.7 F (38.7 C)] 101.3 F (38.5 C) (03/26 0400) Pulse Rate:  [69-85] 76 (03/26 0700) Resp:  [15-26] 18 (03/26 0700) BP: (110-172)/(72-102) 138/87 (03/26 0700) SpO2:  [95 %-100 %] 97 % (03/26 0700) FiO2 (%):  [30 %] 30 % (03/26 0255) Weight:  [71 kg] 71 kg (03/26 0500)  Intubated. Very large extruding tongue. Eyes open but no meaningful eye contact. Left gaze preference.  PERL, positive corneals, and gag. Follows some commands on the right, but no movement on the left and not withdrawing to pain on the left.   ASSESSMENT/PLAN Ms. Prescious Hurless is a 59 y.o. female with history of ICH 7 yrs ago, HTN found unresponsive. Found to have L sided hemiplegia, R gaze, SBP 250.  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 Moderate IVH. Mass effect w/ partial effacment R lateral and 3rd ventricles. 45mm L midline shift. Suspicious for early hydrocephalus.   Repeat CT head 3/16 interval L frontal lobe EVD w/ tip at R thalamus. Interval increased in R ICH now w/ 64mm L midline shift. IVH extended into B lateral, 3rd and 4th ventricles. Prominent B superior ophthalmic vein suggestion increased ICP.   CT repeat 3/17 - stable MLS 72mm, hematoma and hydrocephalus  CT repeat 3/20 - stable hematoma and edeam. Decreased hydrocephalus.   CT repeat 3/25 evolution R thalamic ICH w/ decreasing clot. Increased ventricular volume s/p EVD removal. 1cm midline shift. Sinusitis.  CT repeat 3/26 - Stable volume of right thalamic and intraventricular hemorrhage. Ventriculomegaly with 2 mm of increased lateral ventricular diameter when compared yesterday.  2D Echo EF 60-65%.No arterial finding for bleed . Severe LVH. 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   Cytotoxic Cerebral Edema Obstructive hydrocephalus Induced Hypernatremia  EVD placed 3/15 [name])  CT 3/16 - hematoma mild enlargement  CT 3/17 - stable hematoma and MLS  CT 3/20 - stable hematoma and edema with decreased hydrocephalus  CT repeat 3/25 evolution R thalamic ICH w/ decreasing clot. Increased ventricular volume s/p EVD  removal. 1cm midline shift.   CT repeat 3/26 - Stable volume of right thalamic and intraventricular hemorrhage. Ventriculomegaly with 2 mm of increased lateral ventricular diameter when compared yesterday.  Off 3% now, on NS @ 25  Na 134->...->159->154->151->152->155-<155->152->150->148  EVD removed 3/24  Acute Respiratory Failure d/t ICH Probable Aspiration PNA Fever   Intubated in ED  T-max 101.7->99.5->101.2->100.7->99.4->100.5->101.4->afebrile->101.3  CCM on board  Sedation, off propofol, on fentanyl  Pneumonia - (ceftriaxone and metronidazole 3/16>>3/20) Currently no antibiotics  Cefepime 07/23/19>>07/27/19 (EVD out)  CXR 3/24 Pulmonary vascular congestion and mild interstitial edema.  CXR 3/26 - Left lower lobe atelectasis/infiltrate. Small left pleural effusion again noted.  UA neg; UCx 3/24 - no growth - final   Blood culture no growth < 24h  Trach planned Saturday  Hypertensive Emergency  SBP > 250 on arrival  Home meds:  Coreg, HCTZ and losartan  off Cleviprex gtt now . SBP goal <160 (SBP 137->167) . On losartan 50 bid and Coreg 25 bid and amlodipine 10 and catapres 0.1->0.2 tid . Long-term BP goal normotensive  Dysphagia . Secondary to stroke . NPO . Cortrak . On tube feeds @ 45 . On IVF @ 25  Speech to see once exutbated   Other Stroke Risk  Factors  Obesity, Body mass index is 30.57 kg/m., recommend weight loss, diet and exercise as appropriate   Hx stroke/TIA  ICH 7 yrs ago. No data available in EPIC  obstructive sleep apnea  Other Active Problems  Hypokalemia 3.2->5.6->4.1->3.0 -> 3.4->2.9->2.8->2.7->3.8->3.9->3.8->3.2 - supplement->3.5  Mild anemia of critical illness - Hb - 11.3 ->11.4->10.2->10.0->9.9->9.3  Hospital day # 11    I am going to be more aggressive in BP control since degree of blood and hydrocephalus are not improving as rapidly as I would like after 10 days and she no longer has an EVD.  I will make goal SBP <140.  Continue supportive care and will repeat CT in a few days.    Rogue Jury, MS, MD     This patient is critically ill due to [place], IVH, hydrocephalus status post EVD, hypertensive emergency and at significant risk of neurological worsening, death form hematoma expansion, obstructive hydrocephalus, cerebral edema, brain herniation. This patient's care requires constant monitoring of vital signs, hemodynamics, respiratory and cardiac monitoring, review of multiple databases, neurological assessment, discussion with family, other specialists and medical decision making of high complexity. I spent 35 minutes of neurocritical care time in the care of this patient. I had long discussion with one daughter at bedside and one daughter over the phone, updated pt current condition, treatment plan and potential prognosis, and answered all the questions. They expressed understanding and appreciation.      To contact Stroke Continuity provider, please refer to [email or web address] After hours, contact General Neurology

## 2019-07-30 ENCOUNTER — Inpatient Hospital Stay (HOSPITAL_COMMUNITY): Payer: 59

## 2019-07-30 LAB — CBC
HCT: 30.7 % — ABNORMAL LOW (ref 36.0–46.0)
Hemoglobin: 9.6 g/dL — ABNORMAL LOW (ref 12.0–15.0)
MCH: 28.6 pg (ref 26.0–34.0)
MCHC: 31.3 g/dL (ref 30.0–36.0)
MCV: 91.4 fL (ref 80.0–100.0)
Platelets: 220 10*3/uL (ref 150–400)
RBC: 3.36 MIL/uL — ABNORMAL LOW (ref 3.87–5.11)
RDW: 14.9 % (ref 11.5–15.5)
WBC: 10.4 10*3/uL (ref 4.0–10.5)
nRBC: 0 % (ref 0.0–0.2)

## 2019-07-30 LAB — GLUCOSE, CAPILLARY
Glucose-Capillary: 145 mg/dL — ABNORMAL HIGH (ref 70–99)
Glucose-Capillary: 164 mg/dL — ABNORMAL HIGH (ref 70–99)
Glucose-Capillary: 160 mg/dL — ABNORMAL HIGH (ref 70–99)
Glucose-Capillary: 116 mg/dL — ABNORMAL HIGH (ref 70–99)
Glucose-Capillary: 133 mg/dL — ABNORMAL HIGH (ref 70–99)
Glucose-Capillary: 163 mg/dL — ABNORMAL HIGH (ref 70–99)

## 2019-07-30 LAB — BASIC METABOLIC PANEL WITH GFR
Anion gap: 10 (ref 5–15)
BUN: 16 mg/dL (ref 6–20)
CO2: 27 mmol/L (ref 22–32)
Calcium: 9.9 mg/dL (ref 8.9–10.3)
Chloride: 107 mmol/L (ref 98–111)
Creatinine, Ser: 0.44 mg/dL (ref 0.44–1.00)
GFR calc Af Amer: >60 mL/min
GFR calc non Af Amer: >60 mL/min
Glucose, Bld: 169 mg/dL — ABNORMAL HIGH (ref 70–99)
Potassium: 3.6 mmol/L (ref 3.5–5.1)
Sodium: 144 mmol/L (ref 135–145)

## 2019-07-30 LAB — TRIGLYCERIDES: Triglycerides: 201 mg/dL — ABNORMAL HIGH (ref <150)

## 2019-07-30 LAB — MAGNESIUM: Magnesium: 2 mg/dL (ref 1.7–2.4)

## 2019-07-30 MED ORDER — VECURONIUM BROMIDE 10 MG IV SOLR
10.0000 mg | Freq: Once | INTRAVENOUS | Status: AC
  Administered 2019-07-30: 10 mg via INTRAVENOUS
  Filled 2019-07-30: qty 10

## 2019-07-30 MED ORDER — HYDRALAZINE HCL 50 MG PO TABS
50.0000 mg | ORAL_TABLET | Freq: Four times a day (QID) | ORAL | Status: DC
  Administered 2019-07-30 – 2019-08-02 (×13): 50 mg
  Filled 2019-07-30 (×12): qty 1

## 2019-07-30 MED ORDER — HEPARIN SODIUM (PORCINE) 5000 UNIT/ML IJ SOLN
5000.0000 [IU] | Freq: Three times a day (TID) | INTRAMUSCULAR | Status: AC
  Administered 2019-07-31 – 2019-08-02 (×7): 5000 [IU] via SUBCUTANEOUS
  Filled 2019-07-30 (×8): qty 1

## 2019-07-30 MED ORDER — CLONIDINE HCL 0.1 MG PO TABS
0.3000 mg | ORAL_TABLET | Freq: Three times a day (TID) | ORAL | Status: DC
  Administered 2019-07-30 – 2019-08-07 (×24): 0.3 mg
  Filled 2019-07-30 (×25): qty 3

## 2019-07-30 MED ORDER — LEVOFLOXACIN IN D5W 500 MG/100ML IV SOLN
500.0000 mg | INTRAVENOUS | Status: AC
  Administered 2019-07-30 – 2019-08-03 (×5): 500 mg via INTRAVENOUS
  Filled 2019-07-30 (×5): qty 100

## 2019-07-30 MED ORDER — FENTANYL CITRATE (PF) 100 MCG/2ML IJ SOLN
200.0000 ug | Freq: Once | INTRAMUSCULAR | Status: AC
  Administered 2019-07-30: 100 ug via INTRAVENOUS
  Administered 2019-07-30: 50 ug via INTRAVENOUS
  Filled 2019-07-30: qty 4

## 2019-07-30 MED ORDER — MIDAZOLAM HCL 2 MG/2ML IJ SOLN
5.0000 mg | Freq: Once | INTRAMUSCULAR | Status: AC
  Administered 2019-07-30: 2 mg via INTRAVENOUS
  Administered 2019-07-30: 1 mg via INTRAVENOUS
  Filled 2019-07-30: qty 6

## 2019-07-30 MED ORDER — ETOMIDATE 2 MG/ML IV SOLN
40.0000 mg | Freq: Once | INTRAVENOUS | Status: AC
  Administered 2019-07-30: 20 mg via INTRAVENOUS
  Filled 2019-07-30: qty 20

## 2019-07-30 MED ORDER — HYDRALAZINE HCL 50 MG PO TABS
50.0000 mg | ORAL_TABLET | Freq: Four times a day (QID) | ORAL | Status: DC
  Filled 2019-07-30: qty 1

## 2019-07-30 NOTE — Procedures (Signed)
Bedside Tracheostomy Insertion Procedure Note   Patient Details:   Name: Haley Hernandez DOB: 31-May-1960 MRN: [id number]  Procedure: Tracheostomy  Pre Procedure Assessment: ET Tube Size: ET Tube secured at lip (cm): Bite block in place: No Breath Sounds: Clear and Diminished  Post Procedure Assessment: BP 137/90   Pulse 62   Temp 98.2 F (36.8 C) (Axillary)   Resp 18   Ht 5' (1.524 m)   Wt 73.6 kg   SpO2 100%   BMI 31.69 kg/m  O2 sats: stable throughout Complications: No apparent complications Patient did tolerate procedure well Tracheostomy Brand:Shiley Tracheostomy Style:Cuffed Tracheostomy Size: 6.0 Tracheostomy Secured MWU:[id number] Tracheostomy Placement Confirmation:Trach cuff visualized and in place and Chest X ray ordered for placement  RT assisted ICU MD with trach placement. Pre and post oxygenated patient.    Morley Kos 07/30/2019, 4:15 PM

## 2019-07-30 NOTE — Progress Notes (Signed)
MD notified that patient's BP has been hard to control throughout the night with prn and with the fentanyl drip off per family request. Patient given PRN and scheduled BP meds. MD increased pt. Po BP meds and pt. BP under parameters at this time.

## 2019-07-30 NOTE — Procedures (Signed)
Critical care bedside percutaneous tracheostomy note.  Indication: A 59 year old woman in an unresponsive state following intracranial hemorrhage.  Unable to protect her airway.  Procedure description:  Consent was obtained from the family.  A preoperative timeout was called.  Patient identity  was verified.  Patient was sedated with fentanyl, Versed, etomidate and given vecuronium.  Bronchoscopic visualization was established.  The patient was positioned supine with a towel roll behind her shoulders.  She was then prepped with chlorhexidine and draped in the usual fashion. The tracheal rings were identified by palpation.  The skin and subcutaneous tissues were then infiltrated with 1% lidocaine with epinephrine.  A midline incision was extended from below the cricoid cartilage to just above the sternal notch. Subcutaneous tissues were blunt dissected to the level of the trachea which was identified by tracheal bounce. Introducer needle was inserted and visualized in the trachea.  Through which a guidewire was introduced.  The trachea was then dilated to accept a 6 Shiley tracheostomy using the Cig[place] technique. The tracheostomy tube position was confirmed bronchoscopically. The tracheostomy was secured with 2 stitches through the flange. There was minimal blood loss.  Patient was hemodynamically stable throughout.  The procedure was well-tolerated.  I performed this procedure.  I was proctored by Dr. Levon Hedger who was present throughout.  Lynnell Catalan, MD [facility] ICU Physician [facility]  Pager: [phone] Mobile: [phone] After hours: [phone].  07/30/2019, 3:50 PM

## 2019-07-30 NOTE — Progress Notes (Signed)
STROKE TEAM PROGRESS NOTE   INTERVAL HISTORY" She has started have increased temperature to 101F.  CXR shows possible left lower lobe infiltrate and pleural effusion.  U/A, BCx, SCx pending.  She is currently off Cleviprex at 12 mg/hr.  Goal SBP <140.  BP well maintained in range.     Vitals:   07/29/19 2300 07/30/19 0000 07/30/19 0118 07/30/19 0400  BP: 121/77 135/78 (!) 155/94   Pulse: 74 73 78   Resp: 18 20 (!) 21   Temp:  99.7 F (37.6 C)  100 F (37.8 C)  TempSrc:  Oral  Oral  SpO2: 94% 97% 95% 98%  Weight:      Height:       CBC:  Recent Labs  Lab 07/28/19 0509 07/29/19 0525  WBC 10.3 9.7  HGB 9.9* 9.3*  HCT 33.1* 30.9*  MCV 94.0 92.2  PLT 216 212   Basic Metabolic Panel:  Recent Labs  Lab 07/24/19 0500 07/24/19 0835 07/25/19 0212 07/25/19 0939 07/28/19 0509 07/29/19 0525  NA 150*   < > 159*   < > 150* 148*  K 2.8*  --   --    < > 3.2* 3.5  CL 125*  --   --    < > 117* 111  CO2 21*  --   --    < > 25 27  GLUCOSE 117*  --   --    < > 147* 185*  BUN 15  --   --    < > 25* 20  CREATININE <0.30*  --   --    < > 0.61 0.50  CALCIUM 8.0*  --   --    < > 10.4* 10.2  MG 2.1   < > 1.7   < > 2.1 2.0  PHOS 1.9*  --  2.6  --   --   --    < > = values in this interval not displayed.   Lipid Panel:     Component Value Date/Time   CHOL 180 07/20/2019 0556   TRIG 272 (H) 07/28/2019 0509   HDL 45 07/20/2019 0556   CHOLHDL 4.0 07/20/2019 0556   VLDL 72 (H) 07/20/2019 0556   LDLCALC 63 07/20/2019 0556   HgbA1c:  Lab Results  Component Value Date   HGBA1C 5.7 (H) 07/19/2019   Urine Drug Screen:     Component Value Date/Time   LABOPIA NONE DETECTED 07/19/2019 0220   COCAINSCRNUR NONE DETECTED 07/19/2019 0220   LABBENZ NONE DETECTED 07/19/2019 0220   AMPHETMU NONE DETECTED 07/19/2019 0220   THCU NONE DETECTED 07/19/2019 0220   LABBARB NONE DETECTED 07/19/2019 0220   IMAGING past 24 hours No results found.  PHYSICAL EXAM  Temp:  [98.4 F (36.9  C)-101.9 F (38.8 C)] 100 F (37.8 C) (03/27 0400) Pulse Rate:  [72-86] 78 (03/27 0118) Resp:  [15-24] 21 (03/27 0118) BP: (109-194)/(69-105) 155/94 (03/27 0118) SpO2:  [94 %-100 %] 98 % (03/27 0400) FiO2 (%):  [30 %] 30 % (03/27 0118)  Intubated. Very large extruding tongue. Eyes open to stimulation but no meaningful eye contact. Left gaze preference.  PERL, positive corneals, and gag. Very little movement to command in BUE and BLE, but slightly more in RUE.   No babinski.     ASSESSMENT/PLAN [name] is a 59 y.o. female with history of ICH 7 yrs ago, HTN found unresponsive. Found to have L sided hemiplegia, R gaze, SBP 250.  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 Moderate IVH. Mass effect w/ partial effacment R lateral and 3rd ventricles. 57mm L midline shift. Suspicious for early hydrocephalus.   Repeat CT head 3/16 interval L frontal lobe EVD w/ tip at R thalamus. Interval increased in R ICH now w/ 66mm L midline shift. IVH extended into B lateral, 3rd and 4th ventricles. Prominent B superior ophthalmic vein suggestion increased ICP.   CT repeat 3/17 - stable MLS 1mm, hematoma and hydrocephalus  CT repeat 3/20 - stable hematoma and edeam. Decreased hydrocephalus.   CT repeat 3/25 evolution R thalamic ICH w/ decreasing clot. Increased ventricular volume s/p EVD removal. 1cm midline shift. Sinusitis.  CT repeat 3/26 - Stable volume of right thalamic and intraventricular hemorrhage. Ventriculomegaly with 2 mm of increased lateral ventricular diameter when compared yesterday.  2D Echo EF 60-65%.No arterial finding for bleed . Severe LVH. 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   Cytotoxic Cerebral Edema Obstructive hydrocephalus Induced Hypernatremia  EVD placed 3/15 Ronnald Ramp)  CT 3/16 - hematoma mild enlargement  CT 3/17 - stable hematoma and MLS  CT 3/20 - stable hematoma and edema with decreased hydrocephalus  CT repeat 3/25 evolution R thalamic ICH w/ decreasing clot. Increased ventricular volume s/p EVD removal. 1cm midline shift.   CT repeat 3/26 - Stable volume of right thalamic and intraventricular hemorrhage. Ventriculomegaly with 2 mm of increased lateral ventricular diameter when compared yesterday.  Off 3% now, on NS @ 25  Na 134->...->159->154->151->152->155-<155->152->150->148  EVD removed 3/24  Acute Respiratory Failure d/t ICH Probable Aspiration PNA Fever   Intubated in ED  T-max 101.7->99.5->101.2->100.7->99.4->100.5->101.4->afebrile->101.3->99.7->100  WBCs -10.3->9.7  CCM on board  Sedation, off propofol, on fentanyl  Pneumonia - (ceftriaxone and metronidazole 3/16>>3/20) Currently no antibiotics  Cefepime 07/23/19>>07/27/19 (EVD out)  CXR 3/24 Pulmonary vascular congestion and mild interstitial edema.  CXR 3/26 - Left lower lobe atelectasis/infiltrate. Small left pleural effusion again noted.  UA neg; UCx 3/24 - no growth - final   Blood culture 07/27/19 no growth x 2 days  New blood cultures 3/26 - pending  Trach planned Saturday  CXR 07/30/19 - pending  New Resp and Urine cultures - pending   Hypertensive Emergency  SBP > 250 on arrival  Home meds:  Coreg, HCTZ and losartan  off Cleviprex gtt now . SBP goal <140 (SBP 121->155) . On losartan 50 bid and Coreg 25 bid and amlodipine 10 and catapres 0.1->0.2 tid . Long-term BP goal normotensive  Dysphagia . Secondary to stroke . NPO . Cortrak . On tube feeds @ 45 . On IVF @ 25  Speech to see once exutbated   Other Stroke  Risk Factors  Obesity, Body mass index is 30.57 kg/m., recommend weight loss, diet and exercise as appropriate   Hx stroke/TIA  ICH 7 yrs ago. No data available in EPIC  obstructive sleep apnea  Other Active Problems  Hypokalemia 3.2->5.6->4.1->3.0 -> 3.4->2.9->2.8->2.7->3.8->3.9->3.8->3.2 - supplement->3.5  Mild anemia of critical illness - Hb - 11.3 ->11.4->10.2->10.0->9.9->9.3  CBC and Bmet - pending   Hospital day # 12    Impression: Right  thalamic hemorrhage with midline shift and mild hydrocephalus.  Mental status is still not great off all sedation.  She is not having movement on the right as much as would be expected.  I will repeat a CT Brain for tomorrow morning to assess blood and HC status.    BP is well controlled on current regimen.  Febrile with possible pneumonia on CXR.  I will start her on Levaquin.  Follow U/A, BCx, and SCx too.  Weston Settle, MS, MD     This patient is critically ill due to ICH, IVH, hydrocephalus status post EVD, hypertensive emergency and at significant risk of neurological worsening, death form hematoma expansion, obstructive hydrocephalus, cerebral edema, brain herniation. This patient's care requires constant monitoring of vital signs, hemodynamics, respiratory and cardiac monitoring, review of multiple databases, neurological assessment, discussion with family, other specialists and medical decision making of high complexity. I spent 35 minutes of neurocritical care time in the care of this patient. I had long discussion with one daughter at bedside and one daughter over the phone, updated pt current condition, treatment plan and potential prognosis, and answered all the questions. They expressed understanding and appreciation.      To contact Stroke Continuity provider, please refer to [email or web address]. After hours, contact General Neurology

## 2019-07-30 NOTE — Progress Notes (Signed)
NAME:  Haley Hernandez, MRN:  [id number], DOB:  [birth year], LOS: 12 ADMISSION DATE:  07/18/2019, CONSULTATION DATE:  07/19/2019 REFERRING MD:  Dr. Lorraine Lax, CHIEF COMPLAINT:  ICH  Brief History   59 year old female with prior history ICH 7 yrs ago and HTN found unresponsive at home found to left hemiplegia and right gaze with SBP 250.  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  Intubated in ER for airway protection.  NSGY consulted and EVD placed.  Placed on cleviprex for blood pressure control and admitted to Neurology.  PCCM consulted for ventilator management.   Past Medical History  HTN OSA ICH (?2014)  Significant Hospital Events   3/15 Admitted, ventriculostomy placed 3/23 ventriculostomy removed 3/24 Fever 101F  Consults:  NSGY  Procedures:  3/15 ETT >> 3/15 EVD >> 3/16 PICC >>   Significant Diagnostic Tests:  3/15 CT head >> 3.2 x 2.9 x 4.2 cm ICH in posterior Rt BG and Rt thalamus, mod IV extension, 4 mm Lt shift 3/16 Echo >> EF 60 to 65%, severe concentric LVH 3/17 CT head >> 7 mm shift 3/17 EEG >> generalized slowing 3/20 CT head >> decreased hydrocephalus 3/24 CT head >>> 1. Expected evolution of right thalamic hematoma with decreasing intraventricular clot. 2. Increased ventricular volume after ventriculostomy removal. 3. 1 cm of midline shift. 4. Sinusitis  3/26 stable amount of blood, increasing ventricular volume  Micro Data:  SARS CoV2 PCR 3/15 >> negative Sputum 3/17 >> negative Sputum 3/20 >> negative Blood 3/20 >>neg Blood, urine 3/26 >>  ID/ cultures/data   Rocephin 3/16 >> 3/19 Flagyl 3/16 >> 3/20 Cefepime 3/20 >> 3/23  Interim history/subjective:  Remains poorly responsive on vent. Bps up today. Off sedation since yesterday.  Objective   Blood pressure (!) 155/94, pulse 78, temperature 100 F (37.8 C), temperature source Oral, resp. rate (!) 21, height 5' (1.524 m), weight 71  kg, SpO2 98 %.    Vent Mode: PRVC FiO2 (%):  [30 %] 30 % Set Rate:  [18 bmp] 18 bmp Vt Set:  [360 mL] 360 mL PEEP:  [5 cmH20] 5 cmH20 Pressure Support:  [8 cmH20] 8 cmH20 Plateau Pressure:  [10 cmH20-14 cmH20] 14 cmH20   Intake/Output Summary (Last 24 hours) at 07/30/2019 8185 Last data filed at 07/30/2019 0032 Gross per 24 hour  Intake 2387.88 ml  Output 950 ml  Net 1437.88 ml   Filed Weights   07/26/19 0500 07/28/19 0500 07/29/19 0500  Weight: 71.5 kg 71.7 kg 71 kg     GEN: ill appearing woman on vent HEENT: macroglossia, ETT in place with copious thin secretions CV: RRR, ext warm PULM: no wheezing, no accessory muscle use GI: Soft, +BS, rectal tube in place EXT: Trace edema NEURO: left gaze preference, will not withdraw for me x 4, occasional R arm twitching PSYCH: cannot assess SKIN: no rashes  Labs 3/26 reviewed: hypernatremia improved, hypercalciemia, low K,  CBC benign Labs today pending  CXR 3/26 left lower lobe infiltrate stable  Resolved Hospital Problem list     Assessment & Plan:   Acute hypoxic respiratory failure with compromised airway. Likely also has OSA. - will need trach/PEG  HTN emergency - improved  - goal SBP < 160 - continue norvasc, coreg, clonidine, cozaar - increase clonidine, add hydralazine, try to titrate off CCB drip, some of this may be pain induced, fentanyl bolus x 1 ordered  Twitching, encephalopathy- another EEG may  be helpful, defer to neuro  ICH 2nd to HTN complicated by cytotoxic cerebral edema, obstructive hydrocephalus. - per stroke team, neurosurgery  Aspiration pneumonia - complete course of cefepime  Hyperglycemia. - SSI, goal 100-180  Anemia of critical illness. - f/u CBC - transfuse for Hb < 7 or significant bleeding  GoC- will discuss with family when they come in  Best practice:  Diet: TF DVT prophylaxis: SCDs GI prophylaxis: Pepcid Mobility: BR Code Status: Full     The patient is critically  ill with multiple organ systems failure and requires high complexity decision making for assessment and support, frequent evaluation and titration of therapies, application of advanced monitoring technologies and extensive interpretation of multiple databases. Critical Care Time devoted to patient care services described in this note independent of APP/resident time (if applicable)  is 32 minutes.   Myrla Halsted MD [facility] 07/30/2019 7:13 AM Personal pager: [phone] If unanswered, please page CCM On-call: #[phone]

## 2019-07-30 NOTE — Procedures (Signed)
Bronchoscopy  Indication: Tracheostomy placement  Consent: Signed in chart  Anesthesia: in place for tracheostomy  Procedure - Timeout performed - Bronchoscope advanced through ETT - Airways examined down to subsegmental level - Following airway examination, direct visualization provided for tracheostomy placement by Dr. Denese Killings  Findings - Appropriate positioning of tracheostomy tube above carina  Specimen(s): none  Complications: none

## 2019-07-30 NOTE — Progress Notes (Signed)
50 mcg Fentanyl and 3 mg Versed wasted in sharps in pt room and witnessed by Milta Deiters, RN

## 2019-07-31 ENCOUNTER — Inpatient Hospital Stay (HOSPITAL_COMMUNITY): Payer: 59

## 2019-07-31 LAB — CBC
HCT: 28.1 % — ABNORMAL LOW (ref 36.0–46.0)
Hemoglobin: 8.7 g/dL — ABNORMAL LOW (ref 12.0–15.0)
MCH: 28.4 pg (ref 26.0–34.0)
MCHC: 31 g/dL (ref 30.0–36.0)
MCV: 91.8 fL (ref 80.0–100.0)
Platelets: 213 10*3/uL (ref 150–400)
RBC: 3.06 MIL/uL — ABNORMAL LOW (ref 3.87–5.11)
RDW: 14.9 % (ref 11.5–15.5)
WBC: 11.6 10*3/uL — ABNORMAL HIGH (ref 4.0–10.5)
nRBC: 0 % (ref 0.0–0.2)

## 2019-07-31 LAB — BASIC METABOLIC PANEL
Anion gap: 9 (ref 5–15)
BUN: 23 mg/dL — ABNORMAL HIGH (ref 6–20)
CO2: 25 mmol/L (ref 22–32)
Calcium: 9.7 mg/dL (ref 8.9–10.3)
Chloride: 107 mmol/L (ref 98–111)
Creatinine, Ser: 0.53 mg/dL (ref 0.44–1.00)
GFR calc Af Amer: >60 mL/min (ref >60)
GFR calc non Af Amer: >60 mL/min (ref >60)
Glucose, Bld: 169 mg/dL — ABNORMAL HIGH (ref 70–99)
Potassium: 3.2 mmol/L — ABNORMAL LOW (ref 3.5–5.1)
Sodium: 141 mmol/L (ref 135–145)

## 2019-07-31 LAB — GLUCOSE, CAPILLARY
Glucose-Capillary: 122 mg/dL — ABNORMAL HIGH (ref 70–99)
Glucose-Capillary: 137 mg/dL — ABNORMAL HIGH (ref 70–99)
Glucose-Capillary: 138 mg/dL — ABNORMAL HIGH (ref 70–99)
Glucose-Capillary: 163 mg/dL — ABNORMAL HIGH (ref 70–99)
Glucose-Capillary: 166 mg/dL — ABNORMAL HIGH (ref 70–99)
Glucose-Capillary: 171 mg/dL — ABNORMAL HIGH (ref 70–99)

## 2019-07-31 LAB — MAGNESIUM: Magnesium: 2 mg/dL (ref 1.7–2.4)

## 2019-07-31 MED ORDER — CHLORHEXIDINE GLUCONATE CLOTH 2 % EX PADS
6.0000 | MEDICATED_PAD | Freq: Every day | CUTANEOUS | Status: DC
  Administered 2019-08-02 – 2019-08-29 (×26): 6 via TOPICAL

## 2019-07-31 MED ORDER — POTASSIUM CHLORIDE 20 MEQ/15ML (10%) PO SOLN
40.0000 meq | ORAL | Status: AC
  Administered 2019-07-31 (×2): 40 meq via ORAL
  Filled 2019-07-31 (×2): qty 30

## 2019-07-31 MED ORDER — TRIAMCINOLONE ACETONIDE 0.1 % MT PSTE
PASTE | Freq: Three times a day (TID) | OROMUCOSAL | Status: DC
  Administered 2019-08-01 – 2019-08-19 (×9): 1 via OROMUCOSAL
  Filled 2019-07-31 (×8): qty 5

## 2019-07-31 NOTE — Progress Notes (Signed)
SLP Cancellation Note  Patient Details Name: Haley Hernandez MRN: [id number] DOB: 14-Jun-1960   Cancelled treatment:       Reason Eval/Treat Not Completed: Other (comment) Patient with new tracheostomy. Orders for SLP eval and treat for PMSV and swallowing received. Will follow pt closely for readiness for SLP interventions as appropriate.       Mahala Menghini., M.A. CCC-SLP Acute Rehabilitation Services Pager [phone] Office [phone]  07/31/2019, 7:22 AM

## 2019-07-31 NOTE — Progress Notes (Signed)
Pt transported to CT via ventilator with no complications noted. Pt placed back on CP/PS once we returned to unit.

## 2019-07-31 NOTE — Progress Notes (Signed)
STROKE TEAM PROGRESS NOTE   INTERVAL HISTORY" She has had Tmax 101F overnight.  U/A, BCx, SCx negative so far.  CBC and BMP normal  She is currently off Cleviprex off and BP has been well controlled.  I reviewed CT Brain from today.  There is slightly less right thalamic ICH and slightly improved ventriculomegaly, but with continued 9 mm right to left shift.    She is s/p tracheostomy.     Vitals:   07/31/19 0319 07/31/19 0400 07/31/19 0500 07/31/19 0600  BP:  106/68 (!) 153/90 (!) 145/88  Pulse: 71 65 72 67  Resp: (!) 21 18 (!) 22 19  Temp:  99.1 F (37.3 C)    TempSrc:  Oral    SpO2: 100% 95% 98% 98%  Weight:      Height:       CBC:  Recent Labs  Lab 07/30/19 0637 07/31/19 0600  WBC 10.4 11.6*  HGB 9.6* 8.7*  HCT 30.7* 28.1*  MCV 91.4 91.8  PLT 220 213   Basic Metabolic Panel:  Recent Labs  Lab 07/25/19 0212 07/25/19 0939 07/29/19 0525 07/30/19 0637  NA 159*   < > 148* 144  K  --    < > 3.5 3.6  CL  --    < > 111 107  CO2  --    < > 27 27  GLUCOSE  --    < > 185* 169*  BUN  --    < > 20 16  CREATININE  --    < > 0.50 0.44  CALCIUM  --    < > 10.2 9.9  MG 1.7   < > 2.0 2.0  PHOS 2.6  --   --   --    < > = values in this interval not displayed.   Lipid Panel:     Component Value Date/Time   CHOL 180 07/20/2019 0556   TRIG 201 (H) 07/30/2019 0637   HDL 45 07/20/2019 0556   CHOLHDL 4.0 07/20/2019 0556   VLDL 72 (H) 07/20/2019 0556   LDLCALC 63 07/20/2019 0556   HgbA1c:  Lab Results  Component Value Date   HGBA1C 5.7 (H) 07/19/2019   Urine Drug Screen:     Component Value Date/Time   LABOPIA NONE DETECTED 07/19/2019 0220   COCAINSCRNUR NONE DETECTED 07/19/2019 0220   LABBENZ NONE DETECTED 07/19/2019 0220   AMPHETMU NONE DETECTED 07/19/2019 0220   THCU NONE DETECTED 07/19/2019 0220   LABBARB NONE DETECTED 07/19/2019 0220   IMAGING past 24 hours CT HEAD WO CONTRAST  Result Date: 07/31/2019 CLINICAL DATA:  Follow-up examination for  intracranial hemorrhage. EXAM: CT HEAD WITHOUT CONTRAST TECHNIQUE: Contiguous axial images were obtained from the base of the skull through the vertex without intravenous contrast. COMPARISON:  Prior CT from 07/29/2019. FINDINGS: Brain: Subacute hemorrhage centered at the right thalamus is slightly decreased in size from previous. Surrounding vasogenic edema with up to 9 mm of localized right-to-left midline shift remains, relatively unchanged. Intraventricular extension with small volume blood layering within the lateral ventricles, little interval changed. Lateral ventriculomegaly is relatively stable. Prior left frontal ventriculostomy tract noted. Possible trace hemorrhage at the peripheral left frontal convexity noted as well, likely related to redistribution. No new intracranial hemorrhage. No acute large vessel territory infarct. Vascular: No hyperdense vessel. Skull: Unchanged. Sinuses/Orbits: Globes and orbital soft tissues within normal limits. Nasogastric tube in place. Pan sinusitis relatively similar to previous. Persistent small bilateral mastoid effusions. Other: None. IMPRESSION: 1.  Slight interval decrease in size of right thalamic hemorrhage, but with similar degree of surrounding vasogenic edema. Localized 9 mm right-to-left shift relatively unchanged. 2. Intraventricular extension with small volume intraventricular blood, stable. Lateral ventriculomegaly not significantly changed. Electronically Signed   By: Jeannine Boga M.D.   On: 07/31/2019 04:12   DG Chest Port 1 View  Result Date: 07/30/2019 CLINICAL DATA:  Tracheostomy in place. EXAM: PORTABLE CHEST 1 VIEW COMPARISON:  Chest radiograph performed earlier today. FINDINGS: Tracheostomy tube tip overlies the upper thoracic trachea. A right upper extremity peripherally inserted central venous catheter tip overlies the superior vena cava. An enteric tube enters the stomach and terminates below the field of view. The heart remains  enlarged. A small a moderate left pleural effusion with associated atelectasis/airspace disease is unchanged. The right hemidiaphragm is obscured which may reflect a small pleural effusion and or atelectasis/airspace disease. There is no pneumothorax. IMPRESSION: 1. Unchanged small to moderate left pleural effusion with associated atelectasis/airspace disease. 2. Obscuration of the right hemidiaphragm which may reflect a small pleural effusion and or atelectasis/airspace disease. Electronically Signed   By: Zerita Boers M.D.   On: 07/30/2019 15:48   DG CHEST PORT 1 VIEW  Result Date: 07/30/2019 CLINICAL DATA:  Tracheostomy tube EXAM: PORTABLE CHEST 1 VIEW COMPARISON:  07/30/2019 at 6:12 a.m. FINDINGS: Single frontal view of the chest demonstrates tracheostomy tube overlying tracheal air column tip just below thoracic inlet. The right-sided PICC and enteric catheter are unchanged. Persistent veiling opacity at the left lung base. Right chest is clear. No pneumothorax. IMPRESSION: 1. Left basilar consolidation and/or effusion, minimally more pronounced than prior study. 2. Tracheostomy tube as above.  Otherwise stable support devices. Electronically Signed   By: Randa Ngo M.D.   On: 07/30/2019 15:18    PHYSICAL EXAM  Temp:  [97.8 F (36.6 C)-101.2 F (38.4 C)] 99.1 F (37.3 C) (03/28 0400) Pulse Rate:  [51-82] 67 (03/28 0600) Resp:  [15-22] 19 (03/28 0600) BP: (98-200)/(62-117) 145/88 (03/28 0600) SpO2:  [91 %-100 %] 98 % (03/28 0600) FiO2 (%):  [30 %] 30 % (03/28 0319) Weight:  [73.6 kg] 73.6 kg (03/27 1415)  Tracheostomy to ventilator.  Very large extruding tongue. Eyes open to stimulation but no meaningful eye contact. Left gaze preference and does move some to midline.  No movement to command in BUE and BLE.  There is a rhythmic RUE continuous movement.  None in the face or leg.     ASSESSMENT/PLAN [name] is a 59 y.o. female with history of ICH 7 yrs ago, HTN found  unresponsive. Found to have L sided hemiplegia, R gaze, SBP 250.  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 Moderate IVH. Mass effect w/ partial effacment R lateral and 3rd ventricles. 53mm L midline shift. Suspicious for early hydrocephalus.   Repeat CT head 3/16 interval L frontal lobe EVD w/ tip at R thalamus. Interval increased in R ICH now w/ 77mm L midline shift. IVH extended into B lateral, 3rd and 4th ventricles. Prominent B superior ophthalmic vein suggestion increased ICP.   CT repeat 3/17 - stable MLS 72mm, hematoma and hydrocephalus  CT repeat 3/20 - stable hematoma and edeam. Decreased hydrocephalus.   CT repeat 3/25 evolution R thalamic ICH w/ decreasing clot. Increased ventricular volume s/p EVD removal. 1cm midline shift. Sinusitis.  CT repeat  3/26 - Stable volume of right thalamic and intraventricular hemorrhage. Ventriculomegaly with 2 mm of increased lateral ventricular diameter when compared yesterday.  CT repeat 3/28 - Slight interval decrease in size of right thalamic hemorrhage, but with similar degree of surrounding vasogenic edema. Localized 9 mm right-to-left shift relatively unchanged. Intraventricular extension with small volume intraventricular blood, stable. Lateral ventriculomegaly not significantly changed.  2D Echo EF 60-65%.No arterial finding for bleed . Severe LVH. 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   Cytotoxic Cerebral Edema Obstructive hydrocephalus Induced Hypernatremia  EVD placed 3/15 [name])  CT 3/16 - hematoma mild enlargement  CT 3/17  - stable hematoma and MLS  CT 3/20 - stable hematoma and edema with decreased hydrocephalus  CT repeat 3/25 evolution R thalamic ICH w/ decreasing clot. Increased ventricular volume s/p EVD removal. 1cm midline shift.   CT repeat 3/26 - Stable volume of right thalamic and intraventricular hemorrhage. Ventriculomegaly with 2 mm of increased lateral ventricular diameter when compared yesterday.  Off 3% now, on NS @ 25  Na 134->...->159->154->151->152->155-<155->152->150->148 (Bmet - pending)  EVD removed 3/24  Acute Respiratory Failure d/t ICH Probable Aspiration PNA Fever   Intubated in ED  T-max 101.7->99.5->101.2->100.7->99.4->100.5->101.4->afebrile->101.3->99.7->100->101.2->99.1  WBCs -10.3->9.7->11.6  CCM on board  Sedation, off propofol, on fentanyl  Pneumonia - (ceftriaxone and metronidazole 3/16>>3/20)   Cefepime 07/23/19>>07/27/19 (EVD out)  CXR 3/24 Pulmonary vascular congestion and mild interstitial edema.  CXR 3/26 - Left lower lobe atelectasis/infiltrate. Small left pleural effusion again noted.  UA neg; UCx 3/24 - no growth - final   Blood culture 07/27/19 no growth x 2 days  Tracheostomy placement - 3/27  CXR 07/30/19 - Unchanged small to moderate left pleural effusion with associated atelectasis/airspace disease. Obscuration of the right hemidiaphragm which may reflect a small pleural effusion and or atelectasis/airspace disease.  Urine cultures - 3/27 - pending  Respiratory culture - 3/26 - no growth < 24 hrs  Blood Cultures - 3/26 - no growth < 24 hrs  Levaquin IV 500 mg daily started 07/30/19 HCAP   Hypertensive Emergency  SBP > 250 on arrival  Home meds:  Coreg, HCTZ and losartan  off Cleviprex gtt now . SBP goal <140 (SBP 98-127) . On losartan 50 bid and Coreg 25 bid and amlodipine 10 and catapres 0.1->0.2 tid . Long-term BP goal normotensive  Dysphagia . Secondary to stroke . NPO . Cortrak . On tube feeds @ 45 . On IVF @ 25  Speech  to see once exutbated   Other Stroke Risk Factors  Obesity, Body mass index is 31.69 kg/m., recommend weight loss, diet and exercise as appropriate   Hx stroke/TIA  ICH 7 yrs ago. No data available in EPIC  obstructive sleep apnea  Other Active Problems  Hypokalemia 3.2->5.6->4.1->3.0 -> 3.4->2.9->2.8->2.7->3.8->3.9->3.8->3.2 - supplement->3.5 (Bmet - pending)  Mild anemia of critical illness - Hb - 11.3 ->11.4->10.2->10.0->9.9->9.3->8.7  Bmet - pending   Hospital day # 13    Impression: Right thalamic hemorrhage with midline shift and mild hydrocephalus.  Mental status is still not great off all sedation.  CT slightly better but with prominent shift.    RUE rhythmic movement- ? Focal seizures or focal status epilepticus which may be impacting mental status -  will order bedside continuous video-EEG monitoring.   BP is well controlled on current regimen.  Febrile with possible pneumonia on CXR.  She is on Levaquin.  Follow U/A, BCx, and SCx too.  Intracranial blood may also cause increased temperature via effects on Hypothalamus.    Weston Settle, MS, MD     This patient is critically ill due to ICH, IVH, hydrocephalus status post EVD, hypertensive emergency and at significant risk of neurological worsening, death form hematoma expansion, obstructive hydrocephalus, cerebral edema, brain herniation. This patient's care requires constant monitoring of vital signs, hemodynamics, respiratory and cardiac monitoring, review of multiple databases, neurological assessment, discussion with family, other specialists and medical decision making of high complexity. I spent 35 minutes of neurocritical care time in the care of this patient. I had long discussion with one daughter at bedside and one daughter over the phone, updated pt current condition, treatment plan and potential prognosis, and answered all the questions. They expressed understanding and appreciation.      To contact  Stroke Continuity provider, please refer to [email or web address]. After hours, contact General Neurology

## 2019-07-31 NOTE — Consult Note (Signed)
Chief Complaint: Patient was seen in consultation today for percutaneous gastric tube placement Chief Complaint  Patient presents with   Code Stroke   at the request of Dr Adaline Sill   Supervising Physician: Irish Lack  Patient Status: [facility] - In-pt  History of Present Illness: Haley Hernandez is a 59 y.o. 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 No pertinent past medical history.  History reviewed. No pertinent surgical history.  Allergies: Penicillins  Medications: Prior to Admission medications   Medication Sig Start Date End Date Taking? Authorizing Provider  albuterol (PROAIR HFA) 108 (90 Base) MCG/ACT inhaler Inhale 1 puff into the lungs every 6 (six) hours as needed for wheezing or shortness of breath.   Yes [provider]  carvedilol (COREG) 12.5 MG tablet Take 12.5 mg by mouth 2 (two) times daily. 05/06/19  Yes [provider]  chlorthalidone (HYGROTON) 25 MG tablet Take 12.5 mg by mouth daily.   Yes [provider]  FEROSUL 325 (65 Fe) MG tablet Take 325 mg by mouth daily. 04/30/19  Yes [provider]  losartan (COZAAR) 50 MG tablet Take 50 mg by mouth daily.   Yes [provider]     History reviewed. No pertinent family history.  Social History   Socioeconomic History   Marital status: Single    Spouse name: Not on file   Number of children: Not on file   Years of education: Not on file    Highest education level: Not on file  Occupational History   Not on file  Tobacco Use   Smoking status: Never Smoker   Smokeless tobacco: Never Used  Substance and Sexual Activity   Alcohol use: Not on file   Drug use: Not on file   Sexual activity: Not on file  Other Topics Concern   Not on file  Social History Narrative   Not on file   Social Determinants of Health   Financial Resource Strain:    Difficulty of Paying Living Expenses:   Food Insecurity:    Worried About Running Out of Food in the Last Year:    Merchant navy officer of Food in the Last Year:   Transportation Needs:    Freight forwarder (Medical):    Lack of Transportation (Non-Medical):   Physical Activity:    Days of Exercise per Week:    Minutes of Exercise per Session:   Stress:    Feeling of Stress :   Social Connections:    Frequency of Communication with Friends and Family:    Frequency of Social Gatherings with Friends and Family:    Attends Religious Services:    Active Member of Clubs or Organizations:    Attends Banker Meetings:    Marital Status:     Review of Systems: A 12 point ROS discussed and pertinent positives are indicated in the HPI above.  All other systems are negative.   Vital Signs: BP 105/65    Pulse 66    Temp 99.5 F (  37.5 C) (Axillary)    Resp 18    Ht 5' (1.524 m)    Wt 162 lb 4.1 oz (73.6 kg)    SpO2 97%    BMI 31.69 kg/m   Physical Exam Vitals reviewed.  Constitutional:      Comments: unresponsive  Cardiovascular:     Rate and Rhythm: Normal rate.     Heart sounds: Normal heart sounds.  Pulmonary:     Comments: vent Abdominal:     Palpations: Abdomen is soft.  Skin:    General: Skin is warm and dry.  Psychiatric:     Comments: Consented daughters Haley Hernandez and Haley Hernandez via phone     Imaging: CT ABDOMEN WO CONTRAST  Result Date: 07/31/2019 CLINICAL DATA:  Intra cerebral hemorrhage, respiratory failure and need for gastrostomy  tube for long-term nutritional needs. EXAM: CT ABDOMEN WITHOUT CONTRAST TECHNIQUE: Multidetector CT imaging of the abdomen was performed following the standard protocol without IV contrast. COMPARISON:  None. FINDINGS: Lower chest: Mild bibasilar atelectasis. Hepatobiliary: No focal liver abnormality is seen. No gallstones, gallbladder wall thickening, or biliary dilatation. Pancreas: Unremarkable. No pancreatic ductal dilatation or surrounding inflammatory changes. Spleen: Normal in size without focal abnormality. Adrenals/Urinary Tract: Adrenal glands are unremarkable. Kidneys are normal, without renal calculi, focal lesion, or hydronephrosis. Stomach/Bowel: A feeding tube extends through the stomach and into the proximal duodenum. No hiatal hernia. The proximal stomach is relatively high in position but the mid to distal stomach is normally positioned. The colon does not overlie the anterior stomach. No evidence of small-bowel obstruction or significant ileus. No free air. Vascular/Lymphatic: No significant vascular findings are present. No enlarged abdominal lymph nodes. Other: No ascites, abnormal fluid collection or anasarca. No hernias involving the abdominal wall. Musculoskeletal: No acute or significant osseous findings. IMPRESSION: No acute findings in the abdomen. A feeding tube extends through the stomach and into the proximal duodenum. The proximal stomach is relatively high in position but the mid to distal stomach is normally positioned and there is no anatomic contraindication to attempted percutaneous gastrostomy tube placement. Electronically Signed   By: Irish Lack M.D.   On: 07/31/2019 11:59   CT ANGIO HEAD W OR WO CONTRAST  Result Date: 07/20/2019 CLINICAL DATA:  Cerebral hemorrhage suspected. EXAM: CT ANGIOGRAPHY HEAD TECHNIQUE: Multidetector CT imaging of the head was performed using the standard protocol during bolus administration of intravenous contrast. Multiplanar CT image  reconstructions and MIPs were obtained to evaluate the vascular anatomy. CONTRAST:  75mL OMNIPAQUE IOHEXOL 350 MG/ML SOLN COMPARISON:  Head CT from yesterday FINDINGS: CTA HEAD Anterior circulation: The carotid arteries show minimal atheromatous changes without stenosis or irregularity. No branch occlusion, beading, or aneurysm. No enhancement within the right thalamic hematoma to suggest a vascular malformation. Posterior circulation: Dominant left vertebral artery. The vertebral and basilar arteries are smooth and widely patent. Significant posterior communicating artery contribution to the right PCA. No branch occlusion or beading. No aneurysm or signs of vascular malformation. Venous sinuses: Not opacified on this arterial study. Anatomic variants: None significant The right thalamic hematoma continues to measure up to 4 x 3 cm on axial slices. Diffuse intraventricular clot with ventricular dilatation that appears stable. The EVD is in stable position with tip at the right thalamic hematoma. Midline shift measures up to 7 mm. IMPRESSION: 1. No arterial finding to explain the patient's thalamic hemorrhage. 2. Size stable thalamic hematoma and midline shift (7 mm). 3. Stable EVD positioning and ventriculomegaly. Electronically Signed   By: Marja Kays  Watts M.D.   On: 07/20/2019 06:51   DG Abd 1 View  Result Date: 07/20/2019 CLINICAL DATA:  Vomiting, increased secretions EXAM: ABDOMEN - 1 VIEW COMPARISON:  None. FINDINGS: General paucity of small bowel gas with nonobstructive pattern of colonic gas, scattered gas present to the rectum. No large burden of stool in the colon. Esophagogastric tube projects with tip just below the diaphragm, side port above the diaphragm. No free air in the abdomen on supine radiograph. Right renal calculus. IMPRESSION: 1. Nonobstructive bowel gas pattern. 2. Esophagogastric tube tip just below the diaphragm, side port above the diaphragm. Recommend advancement to ensure  subdiaphragmatic positioning of tip and side port. These results will be called to the ordering clinician or representative by the Radiologist Assistant, and communication documented in the PACS or [employer]. Electronically Signed   By: Eddie Candle M.D.   On: 07/20/2019 16:58   CT HEAD WO CONTRAST  Result Date: 07/31/2019 CLINICAL DATA:  Follow-up examination for intracranial hemorrhage. EXAM: CT HEAD WITHOUT CONTRAST TECHNIQUE: Contiguous axial images were obtained from the base of the skull through the vertex without intravenous contrast. COMPARISON:  Prior CT from 07/29/2019. FINDINGS: Brain: Subacute hemorrhage centered at the right thalamus is slightly decreased in size from previous. Surrounding vasogenic edema with up to 9 mm of localized right-to-left midline shift remains, relatively unchanged. Intraventricular extension with small volume blood layering within the lateral ventricles, little interval changed. Lateral ventriculomegaly is relatively stable. Prior left frontal ventriculostomy tract noted. Possible trace hemorrhage at the peripheral left frontal convexity noted as well, likely related to redistribution. No new intracranial hemorrhage. No acute large vessel territory infarct. Vascular: No hyperdense vessel. Skull: Unchanged. Sinuses/Orbits: Globes and orbital soft tissues within normal limits. Nasogastric tube in place. Pan sinusitis relatively similar to previous. Persistent small bilateral mastoid effusions. Other: None. IMPRESSION: 1. Slight interval decrease in size of right thalamic hemorrhage, but with similar degree of surrounding vasogenic edema. Localized 9 mm right-to-left shift relatively unchanged. 2. Intraventricular extension with small volume intraventricular blood, stable. Lateral ventriculomegaly not significantly changed. Electronically Signed   By: Jeannine Boga M.D.   On: 07/31/2019 04:12   CT HEAD WO CONTRAST  Result Date: 07/29/2019 CLINICAL DATA:   Follow-up cerebral hemorrhage EXAM: CT HEAD WITHOUT CONTRAST TECHNIQUE: Contiguous axial images were obtained from the base of the skull through the vertex without intravenous contrast. COMPARISON:  Yesterday FINDINGS: Brain: Unchanged size of the right thalamic hematoma with vague periphery related to early reabsorption. Vasogenic edema is also unchanged. Intraventricular clot is unchanged, mainly seen layering at the occipital horns of the lateral ventricles. Lateral ventriculomegaly is slightly increased from yesterday, when measured at the caudate heads the diameter is 38 mm as compared to 36 mm yesterday. Unchanged midline shift of 1 cm. No complicating infarct. Stable appearance of left frontal ventriculostomy tract. Vascular: No hyperdense vessel or unexpected calcification. Skull: Stable Sinuses/Orbits: Partial sinus and mastoid opacification in the setting of intubation. IMPRESSION: 1. Stable volume of right thalamic and intraventricular hemorrhage. 2. Ventriculomegaly with 2 mm of increased lateral ventricular diameter when compared yesterday. Electronically Signed   By: Monte Fantasia M.D.   On: 07/29/2019 04:30   CT HEAD WO CONTRAST  Result Date: 07/28/2019 CLINICAL DATA:  Follow-up cerebral hemorrhage EXAM: CT HEAD WITHOUT CONTRAST TECHNIQUE: Contiguous axial images were obtained from the base of the skull through the vertex without intravenous contrast. COMPARISON:  Five days ago FINDINGS: Brain: Indistinct periphery of the right thalamic hematoma, expected evolution.  Maximal high-density hematoma dimensions is 3.6 cm, decreased from 3.9 cm when measured in the same fashion. Intraventricular blood clot is diminished. Ventriculostomy has been removed with mildly increased ventricular volume. No evidence of acute hemorrhage. The hematoma and swelling causes 1 cm of midline shift. Vascular: Atherosclerotic calcification. Skull: Negative for fracture or bone lesion. Sinuses/Orbits: Nasal intubation  with nasal and paranasal sinus opacification. IMPRESSION: 1. Expected evolution of right thalamic hematoma with decreasing intraventricular clot. 2. Increased ventricular volume after ventriculostomy removal. 3. 1 cm of midline shift. 4. Sinusitis in the setting of nasal intubation. Electronically Signed   By: Marnee Spring M.D.   On: 07/28/2019 06:46   CT HEAD WO CONTRAST  Result Date: 07/23/2019 CLINICAL DATA:  Intracranial hemorrhage follow-up EXAM: CT HEAD WITHOUT CONTRAST TECHNIQUE: Contiguous axial images were obtained from the base of the skull through the vertex without intravenous contrast. COMPARISON:  07/19/2019 FINDINGS: Brain: Parenchymal hemorrhage centered within the right thalamus is again identified with similar size. Significant intraventricular extension is again noted. Associated edema and mass effect are similar with approximately 8 mm leftward midline shift at the level of the third ventricle. Left frontal approach ventriculostomy catheter is in similar position. There is slightly increased hemorrhage along the catheter tract. Decreased size of the lateral ventricles. No new loss of gray-white differentiation. Vascular: No new findings. Skull: No new findings. Sinuses/Orbits: No acute abnormality. Other: None. IMPRESSION: Similar size of parenchymal hemorrhage centered within the right thalamus. Mass effect is similar. Decreased hydrocephalus. Ventriculostomy catheter is in similar position with slightly increased small volume hemorrhage along the catheter tract. Electronically Signed   By: Guadlupe Spanish M.D.   On: 07/23/2019 15:22   CT HEAD WO CONTRAST  Result Date: 07/19/2019 CLINICAL DATA:  ICH. EXAM: CT HEAD WITHOUT CONTRAST TECHNIQUE: Contiguous axial images were obtained from the base of the skull through the vertex without intravenous contrast. COMPARISON:  Head CT July 18, 2019 FINDINGS: Brain: Status post placement of a left frontal approach external ventricular drain  coursing through the frontal horn of the lateral ventricles with the tip at the level of the right thalamus, at the center of the known hemorrhage. There is interval increase of the right thalamic hematoma now measuring approximately 4.0 by 3.1 by 3.0 cm with interval worsening of the leftward midline shift, now measuring 7 mm compared to 4 mm on prior. There is further extension of the hemorrhage into the ventricular system with blood seen in the bilateral lateral ventricles, third and fourth ventricles. There is however an interval decrease in size of the lateral ventricles. Vascular: Small calcified plaques are noted in the bilateral carotid siphons. Skull: Left frontal burr hole for EVD placement with adjacent mild subcutaneous emphysema. Sinuses/Orbits: Prominent bilateral superior ophthalmic vein is noted, progressed from prior CT, suggesting increased intracranial pressure. Mucosal thickening of the bilateral ethmoid cells, sphenoid and maxillary sinuses with mucous retention cyst in the left maxillary sinus. The mastoids are clear. Other: None. IMPRESSION: 1. Interval placement of a left frontal approach external ventricular drain with the tip at the level of the right thalamus. Interval increase in the size of the right thalamic hematoma with interval worsening of the leftward midline shift, now measuring 7 mm compared to 4 mm on prior CT. There is further extension of the hemorrhage into the ventricular system with blood seen in the bilateral lateral ventricles, third and fourth ventricles. The size of the lateral ventricle has decreased. 2. Prominent bilateral superior ophthalmic vein, progressed from prior  CT, suggesting increased intracranial pressure. These results were called by telephone at the time of interpretation on 07/19/2019 at 11:32 am to provider [facility] , who verbally acknowledged these results. Electronically Signed   By: Baldemar LenisKatyucia  De Macedo Rodrigues M.D.   On: 07/19/2019 11:34   DG  Chest Port 1 View  Result Date: 07/30/2019 CLINICAL DATA:  Tracheostomy in place. EXAM: PORTABLE CHEST 1 VIEW COMPARISON:  Chest radiograph performed earlier today. FINDINGS: Tracheostomy tube tip overlies the upper thoracic trachea. A right upper extremity peripherally inserted central venous catheter tip overlies the superior vena cava. An enteric tube enters the stomach and terminates below the field of view. The heart remains enlarged. A small a moderate left pleural effusion with associated atelectasis/airspace disease is unchanged. The right hemidiaphragm is obscured which may reflect a small pleural effusion and or atelectasis/airspace disease. There is no pneumothorax. IMPRESSION: 1. Unchanged small to moderate left pleural effusion with associated atelectasis/airspace disease. 2. Obscuration of the right hemidiaphragm which may reflect a small pleural effusion and or atelectasis/airspace disease. Electronically Signed   By: Romona Curlsyler  Litton M.D.   On: 07/30/2019 15:48   DG CHEST PORT 1 VIEW  Result Date: 07/30/2019 CLINICAL DATA:  Tracheostomy tube EXAM: PORTABLE CHEST 1 VIEW COMPARISON:  07/30/2019 at 6:12 a.m. FINDINGS: Single frontal view of the chest demonstrates tracheostomy tube overlying tracheal air column tip just below thoracic inlet. The right-sided PICC and enteric catheter are unchanged. Persistent veiling opacity at the left lung base. Right chest is clear. No pneumothorax. IMPRESSION: 1. Left basilar consolidation and/or effusion, minimally more pronounced than prior study. 2. Tracheostomy tube as above.  Otherwise stable support devices. Electronically Signed   By: Sharlet SalinaMichael  Brown M.D.   On: 07/30/2019 15:18   DG CHEST PORT 1 VIEW  Result Date: 07/30/2019 CLINICAL DATA:  Pneumonia EXAM: PORTABLE CHEST 1 VIEW COMPARISON:  July 29, 2019 FINDINGS: The mediastinal contour and cardiac silhouette are stable. Heart size is enlarged. Endotracheal tube, nasogastric tube, right central venous  line are unchanged. Patchy consolidation of left lung base is identified unchanged. The right lung is clear. Bony structures are stable. IMPRESSION: Patchy consolidation of left lung base unchanged. Electronically Signed   By: Sherian ReinWei-Chen  Lin M.D.   On: 07/30/2019 09:46   DG Chest Port 1 View  Result Date: 07/29/2019 CLINICAL DATA:  Cerebral hemorrhage.  Respiratory failure. EXAM: PORTABLE CHEST 1 VIEW COMPARISON:  07/27/2019. FINDINGS: Endotracheal tube in stable position. Interim removal of NG tube and placement of feeding tube, its tip is below left hemidiaphragm. Right PICC line stable position. Cardiomegaly. Left lower lobe atelectasis/infiltrate. Small left pleural effusion again noted. No pneumothorax. IMPRESSION: 1. Interim removal of NG tube and placement of feeding tube. Feeding tube tip is below left hemidiaphragm. Endotracheal tube in stable position. 2.  Cardiomegaly.  No pulmonary venous congestion. 3. Left lower lobe atelectasis/infiltrate. Small left pleural effusion again noted. Electronically Signed   By: Maisie Fushomas  Register   On: 07/29/2019 06:28   DG CHEST PORT 1 VIEW  Result Date: 07/27/2019 CLINICAL DATA:  Shortness of breath EXAM: PORTABLE CHEST 1 VIEW COMPARISON:  July 26, 2019 FINDINGS: The heart size and mediastinal contours are unchanged with mild cardiomegaly. ETT is 3 cm above the carina. NG tube is seen within the proximal stomach. A right-sided PICC is seen with the tip at the superior cavoatrial junction. There is a small left pleural effusion. There is prominence of the central pulmonary vasculature. No large airspace consolidation. No acute  osseous abnormality. IMPRESSION: ET tube and lines are in satisfactory position. Pulmonary vascular congestion and mild interstitial edema. Mild cardiomegaly Electronically Signed   By: Jonna Clark M.D.   On: 07/27/2019 05:21   DG Chest Port 1 View  Result Date: 07/26/2019 CLINICAL DATA:  Acute respiratory failure with hypoxemia EXAM:  PORTABLE CHEST 1 VIEW COMPARISON:  07/22/2019 FINDINGS: Endotracheal tube tip at the clavicular heads. The orogastric tube reaches the stomach with tip at the fundus. PICC with tip at the SVC. Retrocardiac reticular density with trace pleural fluid. Cardiomegaly. IMPRESSION: 1. Unremarkable hardware positioning. 2. Atelectasis/infiltrate at the left base with trace pleural fluid. Electronically Signed   By: Marnee Spring M.D.   On: 07/26/2019 05:43   DG Chest Port 1 View  Result Date: 07/22/2019 CLINICAL DATA:  Acute respiratory failure.  Brain hemorrhage EXAM: PORTABLE CHEST 1 VIEW COMPARISON:  Four days ago FINDINGS: Endotracheal tube tip at the clavicular heads. The enteric tube tip is at the proximal stomach. Interval PICC with tip at the distal SVC. Cardiomegaly. New small left pleural effusion. No edema or air leak IMPRESSION: 1. Unremarkable hardware positioning. 2. Mild atelectasis and or pleural fluid at the left base. 3. Cardiomegaly Electronically Signed   By: Marnee Spring M.D.   On: 07/22/2019 07:19   DG Chest Portable 1 View  Result Date: 07/18/2019 CLINICAL DATA:  Endotracheal tube placement. EXAM: PORTABLE CHEST 1 VIEW COMPARISON:  February 03, 2008 FINDINGS: An endotracheal tube is seen with its distal tip approximately 4.0 cm from the carina. A nasogastric tube is noted with its distal tip overlying the body of the stomach. There is no evidence of an acute infiltrate, pleural effusion or pneumothorax. The heart size and mediastinal contours are within normal limits. The visualized skeletal structures are unremarkable. IMPRESSION: Endotracheal tube and nasogastric tube positioning, as described above, without evidence of acute or active cardiopulmonary disease. Electronically Signed   By: Aram Candela M.D.   On: 07/18/2019 22:09   DG Abd Portable 1V  Addendum Date: 07/20/2019   ADDENDUM REPORT: 07/20/2019 18:28 ADDENDUM: Addendum created to correct a voice recognition air in the  findings section of the report. The second sentence in the findings section should read: Tip and side port are NOW below the diaphragm in the stomach. Electronically Signed   By: Narda Rutherford M.D.   On: 07/20/2019 18:28   Result Date: 07/20/2019 CLINICAL DATA:  OG tube advanced. EXAM: PORTABLE ABDOMEN - 1 VIEW COMPARISON:  Radiograph earlier this day. FINDINGS: The enteric tube is but advanced. Tip and side port are no below the diaphragm in the stomach. The tip is directed towards the gastric cardia. Normal bowel gas pattern. IMPRESSION: Tip and side port of the enteric tube in the stomach. Electronically Signed: By: Narda Rutherford M.D. On: 07/20/2019 17:58   EEG adult  Result Date: 07/20/2019 Charlsie Quest, MD     07/20/2019  9:42 AM Patient Name: Haley Hernandez MRN: [id number] Epilepsy Attending: Charlsie Quest Referring Physician/Provider: Dr Marvel Plan Date: 07/20/2019 Duration: 25.10 mins Patient history: 58yo F with R basal ganglia ICH and alternating forced gaze deviation. EEG to evaluate for seizure Level of alertness: coamtose AEDs during EEG study: Propofol Technical aspects: This EEG study was done with scalp electrodes positioned according to the 10-20 International system of electrode placement. Electrical activity was acquired at a sampling rate of 500Hz  and reviewed with a high frequency filter of 70Hz  and a low frequency filter of 1Hz . EEG  data were recorded continuously and digitally stored. DESCRIPTION: EEG showed continuous generalized 3-6hz  polymorphic theta-delta slowing. Hyperventilation and photic stimulation were not performed. ABNORMALITY - Continuous slow, generalized IMPRESSION: This study is suggestive of severe diffuse encephalopathy, non specific to etiology. No seizures or epileptiform discharges were seen throughout the recording. Charlsie Quest   ECHOCARDIOGRAM COMPLETE  Result Date: 07/19/2019    ECHOCARDIOGRAM REPORT   Patient Name:   Haley Hernandez Date of  Exam: 07/19/2019 Medical Rec #:  [id number]       Height:       60.0 in Accession #:    [id number]      Weight:       165.3 lb Date of Birth:  [birth year]       BSA:          1.722 m Patient Age:    58 years        BP:           199/123 mmHg Patient Gender: F               HR:           1 bpm. Exam Location:  Inpatient Procedure: 2D Echo, Cardiac Doppler and Color Doppler Indications:    Stroke 434.91/I163.9  History:        Patient has prior history of Echocardiogram examinations, most                 recent 01/27/2008. COPD; Risk Factors:Sleep Apnea.  Sonographer:    Ross Ludwig RDCS (AE) Referring Phys: [id number] [facility]  Sonographer Comments: Patient is morbidly obese and echo performed with patient supine and on artificial respirator. Image acquisition challenging due to patient body habitus. IMPRESSIONS  1. Left ventricular ejection fraction, by estimation, is 60 to 65%. The left ventricle has hyperdynamic function. The left ventricle has no regional wall motion abnormalities. There is severe concentric left ventricular hypertrophy. Indeterminate diastolic filling due to E-A fusion. Consider evalution for 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 given severe LV wall thickness and pericardial effusion with cMRI or PYP scan.  2. Right ventricular systolic function is normal. The right ventricular size is normal.  3. Left atrial size was moderately dilated.  4. The mitral valve is grossly normal. Trivial mitral valve regurgitation.  5. The aortic valve is tricuspid. Aortic valve regurgitation is not visualized. FINDINGS  Left Ventricle: Left ventricular ejection fraction, by estimation, is 60 to 65%. The left ventricle has hyperdynamic function. The left ventricle has no regional wall motion abnormalities. The left ventricular internal cavity size was small. There is severe concentric left ventricular hypertrophy. Indeterminate diastolic filling due to E-A fusion. Right Ventricle: The right ventricular size is normal. No increase in  right ventricular wall thickness. Right ventricular systolic function is normal. Left Atrium: Left atrial size was moderately dilated. Right Atrium: Right atrial size was normal in size. Pericardium: Trivial pericardial effusion is present. The pericardial effusion is posterior to the left ventricle. Mitral Valve: The mitral valve is grossly normal. Trivial mitral valve regurgitation. Tricuspid Valve: The tricuspid valve is grossly normal. Tricuspid valve regurgitation is trivial. Aortic Valve: The aortic valve is tricuspid. Aortic valve regurgitation is not visualized. Aortic valve mean gradient measures 5.0 mmHg. Aortic valve peak gradient measures 10.8 mmHg. Aortic valve area, by VTI measures 4.25 cm. Pulmonic Valve: The pulmonic valve was grossly normal. Pulmonic valve regurgitation is not visualized. Aorta: The aortic root and ascending aorta are structurally normal, with no evidence of dilitation. Venous: IVC  assessment for right atrial pressure unable to be performed due to mechanical ventilation. IAS/Shunts: There is right bowing of the interatrial septum, suggestive of elevated left atrial pressure. The interatrial septum was not well visualized.  LEFT VENTRICLE PLAX 2D LVIDd:         3.13 cm LVIDs:         2.23 cm LV PW:         2.81 cm LV IVS:        2.20 cm LVOT diam:     2.30 cm LV SV:         89 LV SV Index:   52 LVOT Area:     4.15 cm  RIGHT VENTRICLE             IVC RV Basal diam:  2.79 cm     IVC diam: 2.64 cm RV S prime:     23.60 cm/s TAPSE (M-mode): 2.7 cm LEFT ATRIUM             Index       RIGHT ATRIUM           Index LA diam:        1.60 cm 0.93 cm/m  RA Area:     17.30 cm LA Vol (A2C):   51.5 ml 29.91 ml/m RA Volume:   42.00 ml  24.40 ml/m LA Vol (A4C):   68.2 ml 39.61 ml/m LA Biplane Vol: 63.2 ml 36.71 ml/m  AORTIC VALVE AV Area (Vmax):    3.70 cm AV Area (Vmean):   3.58 cm AV Area (VTI):     4.25 cm AV Vmax:           164.00 cm/s AV Vmean:          110.000 cm/s AV VTI:             0.209 m AV Peak Grad:      10.8 mmHg AV Mean Grad:      5.0 mmHg LVOT Vmax:         146.00 cm/s LVOT Vmean:        94.800 cm/s LVOT VTI:          0.214 m LVOT/AV VTI ratio: 1.02  AORTA Ao Root diam: 3.10 cm Ao Asc diam:  3.40 cm TRICUSPID VALVE TR Peak grad:   24.4 mmHg TR Vmax:        247.00 cm/s  SHUNTS Systemic VTI:  0.21 m Systemic Diam: 2.30 cm Zoila Shutter MD Electronically signed by Zoila Shutter MD Signature Date/Time: 07/19/2019/3:29:52 PM    Final    CT HEAD CODE STROKE WO CONTRAST  Addendum Date: 07/18/2019   ADDENDUM REPORT: 07/18/2019 21:30 ADDENDUM: These results were called by telephone at the time of interpretation on 07/18/2019 at 9 pm to provider Dr. Laurence Slate, who verbally acknowledged these results. Electronically Signed   By: Jackey Loge DO   On: 07/18/2019 21:30   Result Date: 07/18/2019 CLINICAL DATA:  Code stroke. Neuro deficit, acute, stroke suspected. Additional history provided: Neuro deficit, acute, stroke suspected, left-sided weakness, dizziness. EXAM: CT HEAD WITHOUT CONTRAST TECHNIQUE: Contiguous axial images were obtained from the base of the skull through the vertex without intravenous contrast. COMPARISON:  No pertinent prior studies available for comparison. FINDINGS: Brain: There is an acute parenchymal hemorrhage with moderate surrounding edema centered within the region of the posterior right basal ganglia and right thalamus. The dominant portion of the parenchymal hemorrhage measures 3.2 x 2.9 x 4.2 cm. Moderate volume  intraventricular extension of hemorrhage into the lateral, third and fourth ventricles. Associated mass effect with partial effacement of the right lateral and third ventricles. 4 mm leftward midline shift on the current examination. There is mild prominence of the lateral ventricles suspicious for early entrapment/hydrocephalus, particularly the right temporal horn. Vascular: No hyperdense vessel.  Atherosclerotic calcifications. Skull: Normal. Negative for  fracture or focal lesion. Sinuses/Orbits: Visualized orbits demonstrate no acute abnormality. Large left maxillary sinus mucous retention cyst. Bilateral ethmoid and maxillary sinus mucosal thickening. No significant mastoid effusion. IMPRESSION: 3.2 x 2.9 x 4.2 cm acute parenchymal hemorrhage with surrounding edema centered within the posterior right basal ganglia and right thalamus. Moderate volume intraventricular extension. Mass effect with partial effacement of the right lateral and third ventricles, and 4 mm leftward midline shift. Mild prominence of the lateral ventricles suspicious for early entrapment/hydrocephalus. Electronically Signed: By: Jackey Loge DO On: 07/18/2019 20:50   [place] EKG SITE RITE  Result Date: 07/19/2019 If Site Rite image not attached, placement could not be confirmed due to current cardiac rhythm.   Labs:  CBC: Recent Labs    07/28/19 0509 07/29/19 0525 07/30/19 0637 07/31/19 0600  WBC 10.3 9.7 10.4 11.6*  HGB 9.9* 9.3* 9.6* 8.7*  HCT 33.1* 30.9* 30.7* 28.1*  PLT 216 212 220 213    COAGS: Recent Labs    07/18/19 2035  INR 1.0  APTT 23*    BMP: Recent Labs    07/28/19 0509 07/29/19 0525 07/30/19 0637 07/31/19 0600  NA 150* 148* 144 141  K 3.2* 3.5 3.6 3.2*  CL 117* 111 107 107  CO2 GLUCOSE 147* 185* 169* 169*  BUN 25* 20 16 23*  CALCIUM 10.4* 10.2 9.9 9.7  CREATININE 0.61 0.50 0.44 0.53  GFRNONAA >60 >60 >60 >60  GFRAA >60 >60 >60 >60    LIVER FUNCTION TESTS: Recent Labs    07/18/19 2035 07/19/19 0123 07/27/19 0558  BILITOT 0.9  --  0.6  AST 21  --  15  ALT 20  --  18  ALKPHOS 62  --  31*  PROT 8.3*  --  6.6  ALBUMIN 4.1 4.0 2.8*    TUMOR MARKERS: No results for input(s): AFPTM, CEA, CA199, CHROMGRNA in the last 8760 hours.  Assessment and Plan:  CVA ICH-- hypertensive source Vent/trach Aspiration PNA Dysphagia Long term care Scheduled for percutaneous gastric tube placement in IR asap We will contact  RN asap with time frame of procedure  Risks and benefits image guided gastrostomy tube placement was discussed with the patient's daughters Haley Hernandez and Alexis via phone including, but not limited to the need for a barium enema during the procedure, bleeding, infection, peritonitis and/or damage to adjacent structures.  All questions were answered, they are agreeable to proceed. Consent signed and in chart.   Thank you for this interesting consult.  I greatly enjoyed meeting Haley Hernandez and look forward to participating in their care.  A copy of this report was sent to the requesting provider on this date.  Electronically Signed: Robet Leu, PA-C 07/31/2019, 1:32 PM   I spent a total of 20 Minutes    in face to face in clinical consultation, greater than 50% of which was counseling/coordinating care for percutaneous gastric tube placement

## 2019-07-31 NOTE — Progress Notes (Signed)
NAME:  Haley Hernandez, MRN:  [id number], DOB:  [birth year], LOS: 69 ADMISSION DATE:  07/18/2019, CONSULTATION DATE:  07/19/2019 REFERRING MD:  Dr. Lorraine Lax, CHIEF COMPLAINT:  ICH  Brief History   59 year old female with prior history ICH 7 yrs ago and HTN found unresponsive at home found to left hemiplegia and right gaze with SBP 250.  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  Intubated in ER for airway protection.  NSGY consulted and EVD placed.  Placed on cleviprex for blood pressure control and admitted to Neurology.  PCCM consulted for ventilator management.   Past Medical History  HTN OSA ICH (?2014)  Significant Hospital Events   3/15 Admitted, ventriculostomy placed 3/23 ventriculostomy removed 3/24 Fever 101F  Consults:  NSGY  Procedures:  3/15 ETT >> 3/27 Trach 3/28 >> 3/15 EVD >> 3/16 PICC >>   Significant Diagnostic Tests:  3/15 CT head >> 3.2 x 2.9 x 4.2 cm ICH in posterior Rt BG and Rt thalamus, mod IV extension, 4 mm Lt shift 3/16 Echo >> EF 60 to 65%, severe concentric LVH 3/17 CT head >> 7 mm shift 3/17 EEG >> generalized slowing 3/20 CT head >> decreased hydrocephalus 3/24 CT head >>> 1. Expected evolution of right thalamic hematoma with decreasing intraventricular clot. 2. Increased ventricular volume after ventriculostomy removal. 3. 1 cm of midline shift. 4. Sinusitis  3/26 stable amount of blood, increasing ventricular volume  Micro Data:  SARS CoV2 PCR 3/15 >> negative Sputum 3/17 >> negative Sputum 3/20 >> negative Blood 3/20 >>neg Blood, urine 3/26 >>  ID/ cultures/data   Rocephin 3/16 >> 3/19 Flagyl 3/16 >> 3/20 Cefepime 3/20 >> 3/23  Interim history/subjective:  No events. Will open eyes to voice, not following commands.  Objective   Blood pressure (!) 150/92, pulse 73, temperature 99.1 F (37.3 C), temperature source Oral, resp. rate (!) 21, height 5' (1.524 m), weight  73.6 kg, SpO2 97 %.    Vent Mode: PRVC FiO2 (%):  [30 %] 30 % Set Rate:  [18 bmp] 18 bmp Vt Set:  [360 mL] 360 mL PEEP:  [5 cmH20] 5 cmH20 Plateau Pressure:  [12 cmH20-16 cmH20] 14 cmH20   Intake/Output Summary (Last 24 hours) at 07/31/2019 0708 Last data filed at 07/31/2019 0600 Gross per 24 hour  Intake 1513.05 ml  Output 1100 ml  Net 413.05 ml   Filed Weights   07/28/19 0500 07/29/19 0500 07/30/19 1415  Weight: 71.7 kg 71 kg 73.6 kg     GEN: ill appearing woman on vent HEENT: macroglossia, trach in place with thin secretions CV: RRR, ext warm PULM: scattered rhonci, no accessory muscle use GI: Soft, +BS, rectal tube in place EXT: Trace edema NEURO: left gaze preference, will not withdraw for me x 4, R arm and leg move purposefully but not to command PSYCH: RASS -1 SKIN: no rashes  Labs 3/28 reviewed: K replaced, CBC stable  CXR 3/26 left effusion, new R haziness  Resolved Hospital Problem list     Assessment & Plan:   Acute hypoxic respiratory failure with compromised airway. Likely also has OSA. 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 - per stroke team, neurosurgery  Aspiration pneumonia - complete course of cefepime  Hyperglycemia. - SSI, goal 100-180  Anemia of critical illness. - f/u CBC - transfuse for Hb < 7 or significant bleeding  GoC- LTACH is eventual goal  Best practice:  Diet: TF DVT prophylaxis: SCDs GI prophylaxis: Pepcid Mobility: BR Code Status: Full  Dispo: ICU pending vent liberation  The patient is critically ill with multiple organ systems failure and requires high complexity decision making for assessment and support, frequent evaluation and titration of  therapies, application of advanced monitoring technologies and extensive interpretation of multiple databases. Critical Care Time devoted to patient care services described in this note independent of APP/resident time (if applicable)  is 35 minutes.   Myrla Halsted MD [facility] 07/31/2019 7:08 AM Personal pager: [phone] If unanswered, please page CCM On-call: #[phone]

## 2019-07-31 NOTE — Plan of Care (Signed)
Completed CT of abdomen for future placement of PEG tube. Patient tolerated transport well.  Patient's RUE intermittently has rhythmic shaking, witnessed by Dr. Nicholas Lose who ordered a 24 hrs EEG to rule out seizure activity.

## 2019-07-31 NOTE — Progress Notes (Signed)
LTM EEG hooked up and running - no initial skin breakdown - push button tested - neuro notified.  

## 2019-08-01 DIAGNOSIS — R569 Unspecified convulsions: Secondary | ICD-10-CM

## 2019-08-01 LAB — CBC
HCT: 28.1 % — ABNORMAL LOW (ref 36.0–46.0)
Hemoglobin: 8.6 g/dL — ABNORMAL LOW (ref 12.0–15.0)
MCH: 28.1 pg (ref 26.0–34.0)
MCHC: 30.6 g/dL (ref 30.0–36.0)
MCV: 91.8 fL (ref 80.0–100.0)
Platelets: 202 10*3/uL (ref 150–400)
RBC: 3.06 MIL/uL — ABNORMAL LOW (ref 3.87–5.11)
RDW: 15 % (ref 11.5–15.5)
WBC: 9 10*3/uL (ref 4.0–10.5)
nRBC: 0 % (ref 0.0–0.2)

## 2019-08-01 LAB — GLUCOSE, CAPILLARY
Glucose-Capillary: 150 mg/dL — ABNORMAL HIGH (ref 70–99)
Glucose-Capillary: 145 mg/dL — ABNORMAL HIGH (ref 70–99)
Glucose-Capillary: 139 mg/dL — ABNORMAL HIGH (ref 70–99)
Glucose-Capillary: 138 mg/dL — ABNORMAL HIGH (ref 70–99)
Glucose-Capillary: 159 mg/dL — ABNORMAL HIGH (ref 70–99)
Glucose-Capillary: 164 mg/dL — ABNORMAL HIGH (ref 70–99)

## 2019-08-01 LAB — CULTURE, BLOOD (ROUTINE X 2)
Culture: NO GROWTH
Culture: NO GROWTH
Special Requests: ADEQUATE
Special Requests: ADEQUATE

## 2019-08-01 LAB — CULTURE, RESPIRATORY W GRAM STAIN: Culture: NO GROWTH

## 2019-08-01 LAB — BASIC METABOLIC PANEL
Anion gap: 7 (ref 5–15)
BUN: 16 mg/dL (ref 6–20)
CO2: 24 mmol/L (ref 22–32)
Calcium: 9.1 mg/dL (ref 8.9–10.3)
Chloride: 109 mmol/L (ref 98–111)
Creatinine, Ser: 0.35 mg/dL — ABNORMAL LOW (ref 0.44–1.00)
GFR calc Af Amer: >60 mL/min (ref >60)
GFR calc non Af Amer: >60 mL/min (ref >60)
Glucose, Bld: 165 mg/dL — ABNORMAL HIGH (ref 70–99)
Potassium: 3.3 mmol/L — ABNORMAL LOW (ref 3.5–5.1)
Sodium: 140 mmol/L (ref 135–145)

## 2019-08-01 LAB — URINE CULTURE: Culture: NO GROWTH

## 2019-08-01 LAB — MAGNESIUM: Magnesium: 1.9 mg/dL (ref 1.7–2.4)

## 2019-08-01 MED ORDER — LABETALOL HCL 5 MG/ML IV SOLN
10.0000 mg | INTRAVENOUS | Status: DC | PRN
  Administered 2019-08-01 – 2019-08-02 (×7): 40 mg via INTRAVENOUS
  Administered 2019-08-12: 10 mg via INTRAVENOUS
  Administered 2019-08-13: 20 mg via INTRAVENOUS
  Administered 2019-08-13 (×2): 40 mg via INTRAVENOUS
  Administered 2019-08-13: 20 mg via INTRAVENOUS
  Administered 2019-08-13: 40 mg via INTRAVENOUS
  Administered 2019-08-14 (×5): 20 mg via INTRAVENOUS
  Administered 2019-08-14: 40 mg via INTRAVENOUS
  Administered 2019-08-14 – 2019-08-15 (×4): 20 mg via INTRAVENOUS
  Administered 2019-08-15: 40 mg via INTRAVENOUS
  Administered 2019-08-15: 20 mg via INTRAVENOUS
  Administered 2019-08-15: 40 mg via INTRAVENOUS
  Administered 2019-08-15: 20 mg via INTRAVENOUS
  Administered 2019-08-16: 10 mg via INTRAVENOUS
  Administered 2019-08-16: 20 mg via INTRAVENOUS
  Administered 2019-08-16: 10 mg via INTRAVENOUS
  Filled 2019-08-01 (×2): qty 8
  Filled 2019-08-01: qty 4
  Filled 2019-08-01: qty 8
  Filled 2019-08-01: qty 4
  Filled 2019-08-01 (×2): qty 8
  Filled 2019-08-01: qty 4
  Filled 2019-08-01 (×4): qty 8
  Filled 2019-08-01 (×3): qty 4
  Filled 2019-08-01: qty 8
  Filled 2019-08-01 (×2): qty 4
  Filled 2019-08-01 (×2): qty 8
  Filled 2019-08-01: qty 4
  Filled 2019-08-01 (×3): qty 8
  Filled 2019-08-01: qty 4

## 2019-08-01 MED ORDER — VITAL AF 1.2 CAL PO LIQD
1000.0000 mL | ORAL | Status: DC
  Administered 2019-08-01 – 2019-08-02 (×2): 1000 mL

## 2019-08-01 NOTE — Progress Notes (Signed)
Pt was taken off of vent and placed on 5L/28% trach collar. Pt is tolerating at this time. Rt will continue to monitor.

## 2019-08-01 NOTE — Procedures (Addendum)
Patient Name: Haley Hernandez  MRN: [id number]  Epilepsy Attending: Charlsie Quest  Referring Physician/Provider: Dr Weston Settle Duration: 07/31/2019 1540 to 08/01/2019 1055 Total duration of study: 19 hours  Patient history: 58yo F with R basal ganglia ICH. Noted to have RUE rhythmic movement. EEG to evaluate for seizure  Level of alertness: awake/lethargic  AEDs during EEG study: None  Technical aspects: This EEG study was done with scalp electrodes positioned according to the 10-20 International system of electrode placement. Electrical activity was acquired at a sampling rate of 500Hz  and reviewed with a high frequency filter of 70Hz  and a low frequency filter of 1Hz . EEG data were recorded continuously and digitally stored.   DESCRIPTION: During awake state, no clear posterior dominant rhythm was seen.  EEG showed continuous generalized and maximal right frontotemporal region 3-6hz  polymorphic theta-delta slowing. Hyperventilation and photic stimulation were not performed.  Event button was pressed multiple times for right arm semirhythmic intermittent twitching on 3/28/2021at 1801, 1804, 1859, 1900, 1902, 1944, 2012, 2059 and on 08/01/2019 at 0120, 0310, 0419 and 0715.  Concomitant EEG before, during and after the event did not show any EEG change to suggest seizure.  ABNORMALITY - Continuous slow, generalized and lateralized right frontotemporal region   IMPRESSION: This study is suggestive of cortical dysfunction in the right frontotemporal region likely secondary to underlying hemorrhage as well as moderate diffuse encephalopathy, nonspecific etiology. No seizures or epileptiform discharges were seen throughout the recording.  Multiple events of right arm twitching were recorded as described above without concomitant EEG change.  Although focal motor seizures may not be seen on scalp EEG, the semiology of the events makes it less suspicious for focal motor seizures.   Clinical correlation is recommended.  Olyvia Gopal 2013

## 2019-08-01 NOTE — Progress Notes (Signed)
Nutrition Follow-up  DOCUMENTATION CODES:   Obesity unspecified  INTERVENTION:   Increase Vital AF 1.2 to 50 ml/hr (1200 ml/day) via Cortrak tube 30 ml Prostat daily  Provides: 1540 kcal, 105 grams protein, and 973 ml free water.   NUTRITION DIAGNOSIS:   Inadequate oral intake related to inability to eat as evidenced by NPO status. Ongoing   GOAL:   Provide needs based on ASPEN/SCCM guidelines Meeting with TF  MONITOR:   TF tolerance, Vent status, I & O's  REASON FOR ASSESSMENT:   Ventilator    ASSESSMENT:   Pt with PMH of HTN, OSA, and prior ICH admitted from home with acute intraparenchymal hemorrhage in R thalamus and basal ganglia with IVH extension s/p EVD.   Pt discussed during ICU rounds and with RN.  Attempting trach collar today. Plan for PEG placement.   3/24 cortrak tube placed, tip gastric 3/27 trach placed  Patient is currently intubated on ventilator support MV: 7.3 L/min Temp (24hrs), Avg:99.2 F (37.3 C), Min:98 F (36.7 C), Max:99.9 F (37.7 C)  Medications reviewed  Labs reviewed: K+ 3.3 (L) CBG's: [phone]  TF: Vital AF 1.2 @ 45 ml/hr with 30 ml Prostat daily Provides: 1396 kcal, 96 grams protein  Diet Order:   Diet Order            Diet NPO time specified  Diet effective midnight              EDUCATION NEEDS:   No education needs have been identified at this time  Skin:  Skin Assessment: (bite mark under tongue)  Last BM:  100 ml via rectal tube  Height:   Ht Readings from Last 1 Encounters:  07/25/19 5' (1.524 m)    Weight:   Wt Readings from Last 1 Encounters:  08/01/19 75.5 kg    Ideal Body Weight:  45.4 kg  BMI:  Body mass index is 32.51 kg/m.  Estimated Nutritional Needs:   Kcal:  1500  Protein:  90-105 grams  Fluid:  >1.5 L/day  Cammy Copa., RD, LDN, CNSC See AMiON for contact information

## 2019-08-01 NOTE — Progress Notes (Signed)
STROKE TEAM PROGRESS NOTE   INTERVAL HISTORY I have reviewed history of presenting illness, electronic medical records and imaging films in PACS.  Patient remains on ventilatory support despite tracheostomy.  He still has low-grade fever but white count today is improved and she remains on cefepime for suspected aspiration pneumonia.  She remains poorly responsive. She is still on Cleviprex drip for blood pressure control despite being on 4 different blood pressure medications. Long-term EEG monitoring shows muscular twitching's on the right but no electrographic correlation with seizures. Vitals:   08/01/19 0805 08/01/19 0825 08/01/19 0900 08/01/19 0930  BP: (!) 167/93 139/86 (!) 197/103 (!) 139/95  Pulse: 69 65 76 81  Resp: 19 17 18  (!) 22  Temp:      TempSrc:      SpO2: 98% 96% 98% 98%  Weight:      Height:       CBC:  Recent Labs  Lab 07/31/19 0600 08/01/19 0636  WBC 11.6* 9.0  HGB 8.7* 8.6*  HCT 28.1* 28.1*  MCV 91.8 91.8  PLT 213 161   Basic Metabolic Panel:  Recent Labs  Lab 07/31/19 0600 08/01/19 0636  NA 141 140  K 3.2* 3.3*  CL 107 109  CO2 25 24  GLUCOSE 169* 165*  BUN 23* 16  CREATININE 0.53 0.35*  CALCIUM 9.7 9.1  MG 2.0 1.9   Lipid Panel:     Component Value Date/Time   CHOL 180 07/20/2019 0556   TRIG 201 (H) 07/30/2019 0637   HDL 45 07/20/2019 0556   CHOLHDL 4.0 07/20/2019 0556   VLDL 72 (H) 07/20/2019 0556   LDLCALC 63 07/20/2019 0556   HgbA1c:  Lab Results  Component Value Date   HGBA1C 5.7 (H) 07/19/2019   Urine Drug Screen:     Component Value Date/Time   LABOPIA NONE DETECTED 07/19/2019 0220   COCAINSCRNUR NONE DETECTED 07/19/2019 0220   LABBENZ NONE DETECTED 07/19/2019 0220   AMPHETMU NONE DETECTED 07/19/2019 0220   THCU NONE DETECTED 07/19/2019 0220   LABBARB NONE DETECTED 07/19/2019 0220   IMAGING past 24 hours CT ABDOMEN WO CONTRAST  Result Date: 07/31/2019 CLINICAL DATA:  Intra cerebral hemorrhage, respiratory failure and  need for gastrostomy tube for long-term nutritional needs. EXAM: CT ABDOMEN WITHOUT CONTRAST TECHNIQUE: Multidetector CT imaging of the abdomen was performed following the standard protocol without IV contrast. COMPARISON:  None. FINDINGS: Lower chest: Mild bibasilar atelectasis. Hepatobiliary: No focal liver abnormality is seen. No gallstones, gallbladder wall thickening, or biliary dilatation. Pancreas: Unremarkable. No pancreatic ductal dilatation or surrounding inflammatory changes. Spleen: Normal in size without focal abnormality. Adrenals/Urinary Tract: Adrenal glands are unremarkable. Kidneys are normal, without renal calculi, focal lesion, or hydronephrosis. Stomach/Bowel: A feeding tube extends through the stomach and into the proximal duodenum. No hiatal hernia. The proximal stomach is relatively high in position but the mid to distal stomach is normally positioned. The colon does not overlie the anterior stomach. No evidence of small-bowel obstruction or significant ileus. No free air. Vascular/Lymphatic: No significant vascular findings are present. No enlarged abdominal lymph nodes. Other: No ascites, abnormal fluid collection or anasarca. No hernias involving the abdominal wall. Musculoskeletal: No acute or significant osseous findings. IMPRESSION: No acute findings in the abdomen. A feeding tube extends through the stomach and into the proximal duodenum. The proximal stomach is relatively high in position but the mid to distal stomach is normally positioned and there is no anatomic contraindication to attempted percutaneous gastrostomy tube placement. Electronically Signed  By: Irish Lack M.D.   On: 07/31/2019 11:59   EEG LTVM - Continuous Bedside W/ Video Includes Portable EEG Read  Result Date: 08/01/2019 Charlsie Quest, MD     08/01/2019  9:40 AM Patient Name: Haley Hernandez MRN: [id number] Epilepsy Attending: Charlsie Quest Referring Physician/Provider: Dr Weston Settle Duration:  07/31/2019 1540 to 08/01/2019 0940  Patient history: 58yo F with R basal ganglia ICH. Noted to have RUE rhythmic movement. EEG to evaluate for seizure  Level of alertness: awake/lethargic  AEDs during EEG study: None  Technical aspects: This EEG study was done with scalp electrodes positioned according to the 10-20 International system of electrode placement. Electrical activity was acquired at a sampling rate of 500Hz  and reviewed with a high frequency filter of 70Hz  and a low frequency filter of 1Hz . EEG data were recorded continuously and digitally stored.  DESCRIPTION: During awake state, no clear posterior dominant rhythm was seen.  EEG showed continuous generalized and maximal right frontotemporal region 3-6hz  polymorphic theta-delta slowing. Hyperventilation and photic stimulation were not performed. Event button was pressed multiple times for right arm semirhythmic intermittent twitching on 3/28/2021at 1801, 1804, 1859, 1900, 1902, 1944, 2012, 2059 and on 08/01/2019 at 0120, 0310, 0419 and 0715.  Concomitant EEG before, during and after the event did not show any EEG change to suggest seizure.  ABNORMALITY - Continuous slow, generalized and lateralized right frontotemporal region  IMPRESSION: This study is suggestive of cortical dysfunction in the right frontotemporal region likely secondary to underlying hemorrhage as well as moderate diffuse encephalopathy, nonspecific etiology. No seizures or epileptiform discharges were seen throughout the recording. Multiple events of right arm twitching were recorded as described above without concomitant EEG change.  Although focal motor seizures may not be seen on scalp EEG, the semiology of the events makes it less suspicious for focal motor seizures.  Clinical correlation is recommended. [name] 2013    PHYSICAL EXAM  Obese middle-aged lady who is s/p tracheostomy and sedated with mechanical ventilatory support. She has severe enlargement of the  tongue. . Afebrile. Head is nontraumatic. Neck is supple without bruit.    Cardiac exam no murmur or gallop. Lungs are clear to auscultation. Distal pulses are well felt. Neurological Exam : Patient is drowsy.  She opens eyes partially to sternal rub but does not follow any commands and remains globally aphasic.  There is tonic downward eye deviation.  Pupils 3 mm sluggishly reactive.  Doll's eye movements are sluggish.  Corneal flexes are present.  There is some spontaneous respiratory effort.  Cough and gag are present.  There is no spontaneous extremity movements is trace withdrawal in lower extremities to pain.  There is some intermittent myoclonic tremor in the right upper extremity. ASSESSMENT/PLAN Haley Hernandez is a 59 y.o. female with history of ICH 7 yrs ago, HTN found unresponsive. Found to have L sided hemiplegia, R gaze, SBP 250.  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 Moderate IVH. Mass effect w/ partial effacment R lateral and 3rd ventricles. 54mm L midline shift. Suspicious for early hydrocephalus.   Repeat CT head 3/16 interval L frontal lobe EVD w/ tip at R thalamus. Interval increased in R ICH now w/ 25mm L midline shift. IVH extended into B lateral, 3rd and 4th ventricles. Prominent B superior ophthalmic vein suggestion increased ICP.   CT  repeat 3/17 - stable MLS 30mm, hematoma and hydrocephalus  CT repeat 3/20 - stable hematoma and edeam. Decreased hydrocephalus.   CT repeat 3/25 evolution R thalamic ICH w/ decreasing clot. Increased ventricular volume s/p EVD removal. 1cm midline shift. Sinusitis.  CT repeat 3/26 - Stable volume of right thalamic and intraventricular hemorrhage. Ventriculomegaly with 2 mm of increased lateral ventricular diameter when compared yesterday.  CT repeat 3/28 - Slight interval decrease in size of right  thalamic hemorrhage, but with similar degree of surrounding vasogenic edema. Localized 9 mm right-to-left shift relatively unchanged. Intraventricular extension with small volume intraventricular blood, stable. Lateral ventriculomegaly not significantly changed.  2D Echo EF 60-65%.No arterial finding for bleed. Severe LVH. 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   Cytotoxic Cerebral Edema Obstructive hydrocephalus, resolved Induced Hypernatremia, resolved  EVD placed 3/15 [name])  CT 3/16 - hematoma mild enlargement  CT 3/17 - stable hematoma and MLS  CT 3/20 - stable hematoma and edema with decreased hydrocephalus  CT repeat 3/25 evolution R thalamic ICH w/ decreasing clot. Increased ventricular volume s/p EVD removal. 1cm midline shift.   CT repeat 3/26 - Stable volume of right thalamic and intraventricular hemorrhage. Ventriculomegaly with 2 mm of increased lateral ventricular diameter when compared yesterday.  Off 3% now, on NS @ 25  Na 140   EVD removed 3/24  Acute Respiratory Failure d/t ICH Probable Aspiration PNA Fever   Intubated in ED  T-max 100.5   WBCs 9.0   CCM on board  Sedation, off propofol, on fentanyl  Pneumonia - (ceftriaxone and metronidazole 3/16>>3/20)   Cefepime 07/23/19>>07/27/19 (EVD out)  CXR 3/24 Pulmonary vascular congestion and mild interstitial edema.  CXR 3/26 - Left lower lobe atelectasis/infiltrate. Small left pleural effusion again noted.  UA neg; UCx 3/24 - no growth - final   Blood culture 07/27/19 no growth x 2 days  Respiratory culture - 3/26 - no growth < 24 hrs  Blood Cultures - 3/26 - no growth < 24 hrs  Levaquin IV 500 mg daily started 07/30/19>>   HCAP  CXR 07/30/19 - Unchanged small to moderate left pleural effusion with associated atelectasis/airspace disease. Obscuration of the right hemidiaphragm which may reflect a small pleural effusion and or atelectasis/airspace disease.  Tracheostomy placement - 3/27  Urine cultures - 3/27 - pending  Hypertensive Emergency  SBP > 250 on arrival  Home meds:  Coreg, HCTZ and losartan  off Cleviprex gtt now . On losartan 50 bid, Coreg 25 bid, amlodipine 10, catapres 0.2->0.3 tid . Increase SBP goal <180   . Long-term BP goal normotensive  Dysphagia . Secondary to stroke . NPO . Cortrak . On tube feeds @ 50 . On IVF @ 25  Speech to see once exutbated   Other Stroke Risk Factors  Obesity, Body mass index is 32.51 kg/m., recommend weight loss, diet and exercise as appropriate   Hx stroke/TIA  ICH 7 yrs ago. No data available in EPIC  obstructive sleep apnea  Other Active Problems  Hypokalemia 3.3 - CCM managing   Mild anemia of critical illness - Hb - 8.6   RUE rhythmic movement-doubt focal seizures or focal status epilepticus as continuous video-EEG monitoring -showed no seizures during- arm twitching  seen during EEG without associated EEG change, low suspicion for motor seizures   Hospital day # 14   Plan discontinue long-term EEG monitoring.  Change blood pressure goal to systolic below 180 and taper and discontinue Cleviprex drip.  Discussed with pharmacist to adjust oral blood pressure medications and to also increase as needed IV labetalol.  Continue weaning off ventilatory support as per pulmonary critical care team but patient may likely need long-term acute care facility.  Will discuss with family goals of care.This patient is critically ill and at significant risk of neurological worsening, death and care requires constant monitoring of vital signs, hemodynamics,respiratory and cardiac monitoring, extensive review of multiple databases, frequent neurological  assessment, discussion with family, other specialists and medical decision making of high complexity.I have made any additions or clarifications directly to the above note.This critical care time does not reflect procedure time, or teaching time or supervisory time of PA/NP/Med Resident etc but could involve care discussion time.  I spent 30 minutes of neurocritical care time  in the care of  this patient.    Delia Heady, MD  To contact Stroke Continuity provider, please refer to [email or web address]. After hours, contact General Neurology

## 2019-08-01 NOTE — Progress Notes (Signed)
NAME:  Haley Hernandez, MRN:  [id number], DOB:  May 19, 1960, LOS: 40 ADMISSION DATE:  07/18/2019, CONSULTATION DATE:  07/19/2019 REFERRING MD:  Dr. Lorraine Lax, CHIEF COMPLAINT:  ICH  Brief History   59 year old female with prior history ICH 7 yrs ago and HTN found unresponsive at home found to left hemiplegia and right gaze with SBP 250.  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  Intubated in ER for airway protection.  NSGY consulted and EVD placed.  Placed on cleviprex for blood pressure control and admitted to Neurology.  PCCM consulted for ventilator management.   Past Medical History  HTN OSA ICH (?2014)  Significant Hospital Events   3/15 Admitted, ventriculostomy placed 3/23 ventriculostomy removed 3/24 Fever 101F  Consults:  NSGY  Procedures:  3/15 ETT >> 3/27 Trach 3/28 >> 3/15 EVD >> 3/16 PICC >>   Significant Diagnostic Tests:  3/15 CT head >> 3.2 x 2.9 x 4.2 cm ICH in posterior Rt BG and Rt thalamus, mod IV extension, 4 mm Lt shift 3/16 Echo >> EF 60 to 65%, severe concentric LVH 3/17 CT head >> 7 mm shift 3/17 EEG >> generalized slowing 3/20 CT head >> decreased hydrocephalus 3/24 CT head >>> 1. Expected evolution of right thalamic hematoma with decreasing intraventricular clot. 2. Increased ventricular volume after ventriculostomy removal. 3. 1 cm of midline shift. 4. Sinusitis  3/26 stable amount of blood, increasing ventricular volume  Micro Data:  SARS CoV2 PCR 3/15 >> negative Sputum 3/17 >> negative Sputum 3/20 >> negative Blood 3/20 >>neg Blood, urine 3/26 >>  ID/ cultures/data   Rocephin 3/16 >> 3/19 Flagyl 3/16 >> 3/20 Cefepime 3/20 >> 3/23  Interim history/subjective:  No events. On LTVEEG monitoring. On full vent support. 90mcg/hr fentanyl.  Objective   Blood pressure 129/81, pulse 71, temperature 99.1 F (37.3 C), temperature source Axillary, resp. rate 20, height 5' (1.524  m), weight 75.5 kg, SpO2 100 %.    Vent Mode: PRVC FiO2 (%):  [30 %] 30 % Set Rate:  [18 bmp] 18 bmp Vt Set:  [360 mL] 360 mL PEEP:  [5 cmH20] 5 cmH20 Pressure Support:  [15 cmH20] 15 cmH20 Plateau Pressure:  [12 cmH20-16 cmH20] 16 cmH20   Intake/Output Summary (Last 24 hours) at 08/01/2019 0701 Last data filed at 07/31/2019 1800 Gross per 24 hour  Intake 781.15 ml  Output 500 ml  Net 281.15 ml   Filed Weights   07/29/19 0500 07/30/19 1415 08/01/19 0500  Weight: 71 kg 73.6 kg 75.5 kg     GEN: ill appearing woman on vent HEENT: macroglossia, trach in place with thin secretions CV: RRR, ext warm PULM: scattered rhonci, no accessory muscle use GI: Soft, +BS, rectal tube in place EXT: Trace edema NEURO: left gaze preference, will not withdraw for me x 4, L arm and leg move purposefully but not to command (this is stable, prior notes had wrong side) PSYCH: RASS -1 SKIN: no rashes  Lab 3/29 pending  Resolved Hospital Problem list     Assessment & Plan:   Acute hypoxic respiratory failure with compromised airway. Likely also has OSA. - continue PS trials - wean fentanyl drip - trach sutures out 4/1 - usual trach care, suctioning, elevate HOB  Dysphagia - IR PEG consult  HTN emergency - improved - goal SBP < 160 - continue norvasc, coreg, clonidine, cozaar, hydralazine  ICH 2nd to HTN complicated by cytotoxic cerebral edema, obstructive hydrocephalus. -  per stroke team, neurosurgery  R arm twitching - f/u EEG, AEDs per primary  Aspiration pneumonia - complete course of cefepime  Hyperglycemia. - SSI, goal 100-180  Anemia of critical illness. - f/u CBC - transfuse for Hb < 7 or significant bleeding  GoC- LTACH is eventual goal  Best practice:  Diet: TF DVT prophylaxis: SCDs GI prophylaxis: Pepcid Mobility: BR Code Status: Full  Dispo: ICU pending vent liberation  The patient is critically ill with multiple organ systems failure and requires high  complexity decision making for assessment and support, frequent evaluation and titration of therapies, application of advanced monitoring technologies and extensive interpretation of multiple databases. Critical Care Time devoted to patient care services described in this note independent of APP/resident time (if applicable)  is 31 minutes.   Myrla Halsted MD [place] [facility] 08/01/2019 7:01 AM Personal pager: [phone] If unanswered, please page CCM On-call: [phone]

## 2019-08-01 NOTE — Progress Notes (Signed)
SLP Cancellation Note  Patient Details Name: Haley Hernandez MRN: [id number] DOB: [birth year]   Cancelled treatment:       Reason Eval/Treat Not Completed: Patient not medically ready - SLP will continue to follow.    Mahala Menghini., M.A. CCC-SLP Acute Rehabilitation Services Pager [phone] Office [phone]  08/01/2019, 7:53 AM

## 2019-08-01 NOTE — Progress Notes (Signed)
LTM EEG discontinued. No skin breakdown noted. 

## 2019-08-02 LAB — BASIC METABOLIC PANEL
Anion gap: 10 (ref 5–15)
BUN: 14 mg/dL (ref 6–20)
CO2: 27 mmol/L (ref 22–32)
Calcium: 10.4 mg/dL — ABNORMAL HIGH (ref 8.9–10.3)
Chloride: 99 mmol/L (ref 98–111)
Creatinine, Ser: 0.46 mg/dL (ref 0.44–1.00)
GFR calc Af Amer: >60 mL/min (ref >60)
GFR calc non Af Amer: >60 mL/min (ref >60)
Glucose, Bld: 151 mg/dL — ABNORMAL HIGH (ref 70–99)
Potassium: 3.5 mmol/L (ref 3.5–5.1)
Sodium: 136 mmol/L (ref 135–145)

## 2019-08-02 LAB — GLUCOSE, CAPILLARY
Glucose-Capillary: 145 mg/dL — ABNORMAL HIGH (ref 70–99)
Glucose-Capillary: 170 mg/dL — ABNORMAL HIGH (ref 70–99)
Glucose-Capillary: 161 mg/dL — ABNORMAL HIGH (ref 70–99)
Glucose-Capillary: 110 mg/dL — ABNORMAL HIGH (ref 70–99)
Glucose-Capillary: 149 mg/dL — ABNORMAL HIGH (ref 70–99)
Glucose-Capillary: 174 mg/dL — ABNORMAL HIGH (ref 70–99)

## 2019-08-02 LAB — CBC
HCT: 29.9 % — ABNORMAL LOW (ref 36.0–46.0)
Hemoglobin: 9.4 g/dL — ABNORMAL LOW (ref 12.0–15.0)
MCH: 28.1 pg (ref 26.0–34.0)
MCHC: 31.4 g/dL (ref 30.0–36.0)
MCV: 89.3 fL (ref 80.0–100.0)
Platelets: 240 10*3/uL (ref 150–400)
RBC: 3.35 MIL/uL — ABNORMAL LOW (ref 3.87–5.11)
RDW: 14.6 % (ref 11.5–15.5)
WBC: 12 10*3/uL — ABNORMAL HIGH (ref 4.0–10.5)
nRBC: 0 % (ref 0.0–0.2)

## 2019-08-02 LAB — MAGNESIUM: Magnesium: 2 mg/dL (ref 1.7–2.4)

## 2019-08-02 MED ORDER — HYDRALAZINE HCL 50 MG PO TABS
100.0000 mg | ORAL_TABLET | Freq: Four times a day (QID) | ORAL | Status: DC
  Administered 2019-08-02 – 2019-08-07 (×15): 100 mg
  Filled 2019-08-02 (×17): qty 2

## 2019-08-02 MED ORDER — OXYCODONE HCL 5 MG PO TABS
5.0000 mg | ORAL_TABLET | ORAL | Status: DC
  Filled 2019-08-02: qty 1

## 2019-08-02 MED ORDER — POTASSIUM CHLORIDE 20 MEQ/15ML (10%) PO SOLN
20.0000 meq | Freq: Once | ORAL | Status: AC
  Administered 2019-08-02: 20 meq

## 2019-08-02 MED ORDER — POTASSIUM CHLORIDE 20 MEQ/15ML (10%) PO SOLN
20.0000 meq | ORAL | Status: DC
  Filled 2019-08-02: qty 15

## 2019-08-02 MED ORDER — OXYCODONE HCL 5 MG PO TABS
5.0000 mg | ORAL_TABLET | ORAL | Status: DC
  Administered 2019-08-02 – 2019-08-03 (×7): 5 mg
  Filled 2019-08-02 (×6): qty 1

## 2019-08-02 MED ORDER — POTASSIUM CHLORIDE 20 MEQ/15ML (10%) PO SOLN
20.0000 meq | ORAL | Status: AC
  Administered 2019-08-02 (×2): 20 meq
  Filled 2019-08-02 (×2): qty 15

## 2019-08-02 MED ORDER — FUROSEMIDE 10 MG/ML IJ SOLN
40.0000 mg | INTRAMUSCULAR | Status: AC
  Administered 2019-08-02 (×2): 40 mg via INTRAVENOUS
  Filled 2019-08-02 (×2): qty 4

## 2019-08-02 NOTE — Progress Notes (Addendum)
NAME:  Haley Hernandez, MRN:  [id number], DOB:  [birth year], LOS: 54 ADMISSION DATE:  07/18/2019, CONSULTATION DATE:  07/19/2019 REFERRING MD:  Dr. Lorraine Lax, CHIEF COMPLAINT:  ICH  Brief History   59 year old female with prior history ICH 7 yrs ago and HTN found unresponsive at home found to left hemiplegia and right gaze with SBP 250.  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  Intubated in ER for airway protection.  NSGY consulted and EVD placed.  Placed on cleviprex for blood pressure control and admitted to Neurology.  PCCM consulted for ventilator management.   Past Medical History  HTN OSA ICH (?2014)  Significant Hospital Events   3/15 Admitted, ventriculostomy placed 3/23 ventriculostomy removed 3/24 Fever 101F  Consults:  NSGY  Procedures:  3/15 ETT >> 3/27 Trach 3/28 >> 3/15 EVD >> 3/16 PICC >>   Significant Diagnostic Tests:  3/15 CT head >> 3.2 x 2.9 x 4.2 cm ICH in posterior Rt BG and Rt thalamus, mod IV extension, 4 mm Lt shift 3/16 Echo >> EF 60 to 65%, severe concentric LVH 3/17 CT head >> 7 mm shift 3/17 EEG >> generalized slowing 3/20 CT head >> decreased hydrocephalus 3/24 CT head >>> 1. Expected evolution of right thalamic hematoma with decreasing intraventricular clot. 2. Increased ventricular volume after ventriculostomy removal. 3. 1 cm of midline shift. 4. Sinusitis  3/26 stable amount of blood, increasing ventricular volume  Micro Data:  SARS CoV2 PCR 3/15 >> negative Sputum 3/17 >> negative Sputum 3/20 >> negative Blood 3/20 >>neg Blood, urine 3/26 >>  ID/ cultures/data   Rocephin 3/16 >> 3/19 Flagyl 3/16 >> 3/20 Cefepime 3/20 >> 3/23  Interim history/subjective:  No events. Remains poorly responsive. Bps remain tough to control.  Objective   Blood pressure (!) 153/93, pulse 78, temperature 98.9 F (37.2 C), temperature source Axillary, resp. rate (!) 25, height 5' (1.524  m), weight 73 kg, SpO2 96 %.    Vent Mode: PSV;CPAP FiO2 (%):  [28 %-30 %] 28 % PEEP:  [5 cmH20] 5 cmH20 Pressure Support:  [10 DGU44-03 cmH20] 10 cmH20 Plateau Pressure:  [5 cmH20-14 cmH20] 5 cmH20   Intake/Output Summary (Last 24 hours) at 08/02/2019 0736 Last data filed at 08/02/2019 0600 Gross per 24 hour  Intake 1989.8 ml  Output 1450 ml  Net 539.8 ml   Filed Weights   07/30/19 1415 08/01/19 0500 08/02/19 0500  Weight: 73.6 kg 75.5 kg 73 kg     GEN: ill appearing woman on vent HEENT: macroglossia, trach in place with thin secretions CV: RRR, ext warm PULM: scattered rhonci, no accessory muscle use GI: Soft, +BS, rectal tube in place EXT: Trace edema NEURO: left gaze preference, will not withdraw for me x 4, L arm and leg move purposefully but not to command (stable) PSYCH: RASS 0 SKIN: no rashes  Lab 3/29 pending  Resolved Hospital Problem list     Assessment & Plan:   Acute hypoxic respiratory failure with compromised airway. Likely also has OSA. - continue TC - trach sutures out 4/1 - usual trach care, suctioning, elevate HOB  Dysphagia - IR PEG consult, to be done sometime this week  HTN emergency - improved - goal SBP < 160 - continue norvasc, coreg, clonidine, cozaar, hydralazine - increase hydralazine today and add standing oxycodone, she responds well to opiates so some of this may be pain-related - wean cleviprex  ICH 2nd to HTN complicated  by cytotoxic cerebral edema, obstructive hydrocephalus. - per stroke team, neurosurgery  R arm twitching - no EEG correlate for seizure  Aspiration pneumonia - complete 5 days levaquin  Anasarca- lasix x 2 today, replete K  Hyperglycemia. - SSI, goal 100-180  Anemia of critical illness. - f/u CBC - transfuse for Hb < 7 or significant bleeding  GoC- LTACH is eventual goal, can probably start working toward placement  Best practice:  Diet: TF on hold for potential PEG today DVT prophylaxis:  heparin GI prophylaxis: Pepcid Mobility: BR Code Status: Full  Dispo: ICU pending cleviprex wean  The patient is critically ill with multiple organ systems failure and requires high complexity decision making for assessment and support, frequent evaluation and titration of therapies, application of advanced monitoring technologies and extensive interpretation of multiple databases. Critical Care Time devoted to patient care services described in this note independent of APP/resident time (if applicable)  is 31 minutes.   Myrla Halsted MD [place] [facility] 08/02/2019 7:36 AM Personal pager: [phone] If unanswered, please page CCM On-call: #[phone]

## 2019-08-02 NOTE — Progress Notes (Signed)
STROKE TEAM PROGRESS NOTE   INTERVAL HISTORY Patient is on trach collar this morning and seems to be tolerating it well.  She continues to have massive tongue swelling but it appears stable.  Neurologically no changes.  She can be aroused but is not following commands.  Blood pressure adequately controlled now off Cleviprex drip this morning.  Plan for tentative PEG tube later today  Vitals:   08/02/19 1100 08/02/19 1115 08/02/19 1200 08/02/19 1300  BP: 101/64 101/64 117/84 113/70  Pulse: 72 73 68 70  Resp: (!) 24 (!) 24 (!) 26 (!) 21  Temp:   99.6 F (37.6 C)   TempSrc:   Axillary   SpO2: 97% 97% 99% 96%  Weight:      Height:       CBC:  Recent Labs  Lab 08/01/19 0636 08/02/19 0432  WBC 9.0 12.0*  HGB 8.6* 9.4*  HCT 28.1* 29.9*  MCV 91.8 89.3  PLT 202 322   Basic Metabolic Panel:  Recent Labs  Lab 08/01/19 0636 08/02/19 0432  NA 140 136  K 3.3* 3.5  CL 109 99  CO2 24 27  GLUCOSE 165* 151*  BUN 16 14  CREATININE 0.35* 0.46  CALCIUM 9.1 10.4*  MG 1.9 2.0   Lipid Panel:     Component Value Date/Time   CHOL 180 07/20/2019 0556   TRIG 201 (H) 07/30/2019 0637   HDL 45 07/20/2019 0556   CHOLHDL 4.0 07/20/2019 0556   VLDL 72 (H) 07/20/2019 0556   LDLCALC 63 07/20/2019 0556   HgbA1c:  Lab Results  Component Value Date   HGBA1C 5.7 (H) 07/19/2019   Urine Drug Screen:     Component Value Date/Time   LABOPIA NONE DETECTED 07/19/2019 0220   COCAINSCRNUR NONE DETECTED 07/19/2019 0220   LABBENZ NONE DETECTED 07/19/2019 0220   AMPHETMU NONE DETECTED 07/19/2019 0220   THCU NONE DETECTED 07/19/2019 0220   LABBARB NONE DETECTED 07/19/2019 0220   IMAGING past 24 hours No results found.   PHYSICAL EXAM    Obese middle-aged lady who is s/p tracheostomy and sedated with mechanical ventilatory support. She has severe enlargement of the tongue. . Afebrile. Head is nontraumatic. Neck is supple without bruit.    Cardiac exam no murmur or gallop. Lungs are clear to  auscultation. Distal pulses are well felt. Neurological Exam : Patient is drowsy.  She opens eyes partially to sternal rub but does not follow any commands and remains globally aphasic.  There is tonic downward eye deviation.  Pupils 3 mm sluggishly reactive.  Doll's eye movements are sluggish.  Corneal flexes are present.  There is some spontaneous respiratory effort.  Cough and gag are present.  There is no spontaneous extremity movements is trace withdrawal in lower extremities to pain.  There is some intermittent myoclonic tremor in the right upper extremity.   ASSESSMENT/PLAN [name] is a 59 y.o. female with history of ICH 7 yrs ago, HTN found unresponsive. Found to have L sided hemiplegia, R gaze, SBP 250.  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 Moderate IVH. Mass effect w/ partial effacment R lateral and 3rd ventricles. 49mm L midline shift. Suspicious for early hydrocephalus.   Repeat CT head 3/16 interval L frontal lobe EVD w/ tip at R thalamus. Interval increased in R ICH now w/ 66mm L midline shift.  IVH extended into B lateral, 3rd and 4th ventricles. Prominent B superior ophthalmic vein suggestion increased ICP.   CT repeat 3/17 - stable MLS 86mm, hematoma and hydrocephalus  CT repeat 3/20 - stable hematoma and edeam. Decreased hydrocephalus.   CT repeat 3/25 evolution R thalamic ICH w/ decreasing clot. Increased ventricular volume s/p EVD removal. 1cm midline shift. Sinusitis.  CT repeat 3/26 - Stable volume of right thalamic and intraventricular hemorrhage. Ventriculomegaly with 2 mm of increased lateral ventricular diameter when compared yesterday.  CT repeat 3/28 - Slight interval decrease in size of right thalamic hemorrhage, but with similar degree of surrounding vasogenic edema. Localized 9 mm right-to-left shift relatively  unchanged. Intraventricular extension with small volume intraventricular blood, stable. Lateral ventriculomegaly not significantly changed.  2D Echo EF 60-65%.No arterial finding for bleed. Severe LVH. 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  EEG severe diffuse encephalopathy   LDL 63   HgbA1c 5.7  Heparin 5000 units sq tid for VTE prophylaxis   No antithrombotic prior to admission, now on No antithrombotic given hemorrhage   Therapy recommendations: pending  Disposition:  pending - no insurance coverage for LTACH  Cytotoxic Cerebral Edema Obstructive hydrocephalus, resolved Induced Hypernatremia, resolved  EVD placed 3/15 [name])  CT 3/16 - hematoma mild enlargement  CT 3/17 - stable hematoma and MLS  CT 3/20 - stable hematoma and edema with decreased hydrocephalus  CT repeat 3/25 evolution R thalamic ICH w/ decreasing clot. Increased ventricular volume s/p EVD removal. 1cm midline shift.   CT repeat 3/26 - Stable volume of right thalamic and intraventricular hemorrhage. Ventriculomegaly with 2 mm of increased lateral ventricular diameter when compared yesterday.  Off 3% now, on NS @ 25  Na 140   EVD removed 3/24  Acute Respiratory Failure d/t ICH Aspiration PNA Fever , resolved  Intubated in ED  T-max afebrile  WBCs 12.0  CCM on board  Sedation, off propofol, on fentanyl  Pneumonia - (ceftriaxone and metronidazole 3/16>>3/20)   Cefepime 07/23/19>>07/27/19 (EVD out)  CXR 3/24 Pulmonary vascular congestion and mild interstitial edema.  CXR 3/26 - Left lower lobe atelectasis/infiltrate. Small left pleural effusion again noted.  UA neg; UCx 3/24 - no growth - final   Blood culture 07/27/19 no growth x 2 days  Respiratory culture - 3/26 - no growth 2 days  Blood Cultures - 3/26 - no growth   Urine cultures - 3/27 - no growth   CXR 07/30/19 - Unchanged small to moderate left pleural effusion with associated atelectasis/airspace disease. Obscuration of the right  hemidiaphragm which may reflect a small pleural effusion and or atelectasis/airspace disease.  Levaquin IV 500 mg daily started 3/27>>3/31 HCAP  Tracheostomy placement - 3/27, sutures out 4/1  Hypertensive Emergency Tongue Swellling  SBP > 250 on arrival  Home meds:  Coreg, HCTZ and losartan . Back on Cleviprex overnight -> wean . On losartan 50 bid, Coreg 25 bid,  catapres 0.2->0.3 tid, hydralazine 100 q6h, lasix 40 q4h . Stop CCB (amlodipine) d/t tongue swelling . CCM added standing oxycodone . SBP goal <180   . Long-term BP goal normotensive  Dysphagia . Secondary to stroke . NPO . Cortrak . On tube feeds @ 50 . On IVF @ 25 . For PEG this week  Other Stroke Risk Factors  Obesity, Body mass index is 31.43 kg/m., recommend weight loss, diet and exercise as appropriate   Hx stroke/TIA  ICH 7 yrs ago. No data available in EPIC  obstructive sleep apnea  Other Active Problems  Hypokalemia  3.5 - CCM managing   Mild anemia of critical illness - Hb - 9.4  RUE rhythmic movement-doubt focal seizures or focal status epilepticus as continuous video-EEG monitoring -showed no seizures during- arm twitching seen during EEG without associated EEG change, low suspicion for motor seizures   Hospital day # 15  Continue trach collar trials and if patient is able to tolerate it may consider nursing home after PEG tube.  If she needs prolonged ventilatory support may need to consult LTAC instead.  Plan for PEG hopefully later today.  Continue to wean off Cleviprex drip and discontinue amlodipine as it may be contributing to tongue swelling.  No family available for discussion of goals of care.  Discussed with Dr. Katrinka Blazing critical care medicine. .This patient is critically ill and at significant risk of neurological worsening, death and care requires constant monitoring of vital signs, hemodynamics,respiratory and cardiac monitoring, extensive review of multiple databases, frequent  neurological assessment, discussion with family, other specialists and medical decision making of high complexity.I have made any additions or clarifications directly to the above note.This critical care time does not reflect procedure time, or teaching time or supervisory time of PA/NP/Med Resident etc but could involve care discussion time.  I spent 30 minutes of neurocritical care time  in the care of  this patient.    Delia Heady, MD  To contact Stroke Continuity provider, please refer to [email or web address]. After hours, contact General Neurology

## 2019-08-02 NOTE — Progress Notes (Signed)
Physical Therapy Treatment Patient Details Name: Haley Hernandez MRN: [id number] DOB: [birth year] Today's Date: 08/02/2019    History of Present Illness 59 y.o. female with past medical history of intracerebral hemorrhage 7 years ago, hypertension presents to the emergency department after family found her unresponsive with L hemipleiga and R gaze deviation. 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 Pt intubated and with EVD placed on 3/15. EVD removed 3/24. tracheostomy 3/27; EEG 3/29 moderate diffuse encephalopathy with no seizures (multiple events of RUE twitching occurred during EEG without EEG changes)    PT Comments    Per RN patient not following commands and not moving extremities spontaneously (which she was doing last week per RN). Patient's eyes open on arrival. Not tracking therapist or turning eyes to sounds/name called. +blinking but not to threat. PROM x 4 extremities with attempts at AROM/following commands however pt did not engage in activities except: 1) Pt lifted rt thumb to "show me thumb's up" x 1 but would not repeat, 2) Extended LLE from flexed position x 1 but would not repeat. Will continue therapy attempts, however if participation/responses do not improve pt will likely not be a candidate for post-acute therapies.     Follow Up Recommendations  SNF;LTACH;Supervision/Assistance - 24 hour(SNF vs LTACH pending ability to participate with PT)     Equipment Recommendations  (TBD)    Recommendations for Other Services       Precautions / Restrictions Precautions Precautions: Fall;Other (comment) Precaution Comments: SBP<160, HOB>30 degrees Restrictions Weight Bearing Restrictions: No    Mobility  Bed Mobility                  Transfers                    Ambulation/Gait                 Stairs             Wheelchair Mobility    Modified Rankin (Stroke Patients  Only) Modified Rankin (Stroke Patients Only) Pre-Morbid Rankin Score: (unable to determine at this time) Modified Rankin: Severe disability     Balance                                            Cognition Arousal/Alertness: Lethargic(awakens with verbal stimulation) Behavior During Therapy: Flat affect Overall Cognitive Status: Difficult to assess                                 General Comments: on trach collar; significant angioedema with inability to attempt lipspeaking      Exercises      General Comments General comments (skin integrity, edema, etc.):      Pertinent Vitals/Pain Pain Assessment: Faces Faces Pain Scale: No hurt    Home Living                      Prior Function            PT Goals (current goals can now be found in the care plan section) Acute Rehab PT Goals Patient Stated Goal: Pt unable to participate in goal setting, to improve strength and reduce caregiver burden Time For Goal Achievement: 08/12/19 Potential to Achieve Goals: Poor Progress towards PT goals:  Not progressing toward goals - comment    Frequency    Min 3X/week      PT Plan Current plan remains appropriate    Co-evaluation              AM-PAC PT "6 Clicks" Mobility   Outcome Measure  Help needed turning from your back to your side while in a flat bed without using bedrails?: Total Help needed moving from lying on your back to sitting on the side of a flat bed without using bedrails?: Total Help needed moving to and from a bed to a chair (including a wheelchair)?: Total Help needed standing up from a chair using your arms (e.g., wheelchair or bedside chair)?: Total Help needed to walk in hospital room?: Total Help needed climbing 3-5 steps with a railing? : Total 6 Click Score: 6    End of Session Equipment Utilized During Treatment: Oxygen Activity Tolerance: Patient tolerated treatment well Patient left: in bed;with  call bell/phone within reach   PT Visit Diagnosis: Other symptoms and signs involving the nervous system (R29.898);Muscle weakness (generalized) (M62.81);Hemiplegia and hemiparesis Hemiplegia - Right/Left: Left Hemiplegia - caused by: Nontraumatic intracerebral hemorrhage     Time: 2836-6294 PT Time Calculation (min) (ACUTE ONLY): 20 min  Charges:  $Neuromuscular Re-education: 8-22 mins                      Haley Hernandez, PT Pager [phone]    [name] 08/02/2019, 3:51 PM

## 2019-08-02 NOTE — Progress Notes (Signed)
[facility] ADULT ICU REPLACEMENT PROTOCOL FOR AM LAB REPLACEMENT ONLY  The patient does apply for the [facility] Adult ICU Electrolyte Replacment Protocol based on the criteria listed below:   1. Is GFR >/= 40 ml/min? Yes.    Patient's GFR today is >60 2. Is urine output >/= 0.5 ml/kg/hr for the last 6 hours? Yes.   Patient's UOP is 1.25 ml/kg/hr 3. Is BUN < 60 mg/dL? Yes.    Patient's BUN today is 14 4. Abnormal electrolyte(s): K+ 3.5 5. Ordered repletion with: protocol 6. If a panic level lab has been reported, has the CCM MD in charge been notified? Yes.  .   Physician:  Althia Forts, Lilia Argue 08/02/2019 6:13 AM

## 2019-08-03 ENCOUNTER — Encounter (HOSPITAL_COMMUNITY): Payer: Self-pay | Admitting: Neurology

## 2019-08-03 ENCOUNTER — Inpatient Hospital Stay (HOSPITAL_COMMUNITY): Payer: 59

## 2019-08-03 DIAGNOSIS — Z930 Tracheostomy status: Secondary | ICD-10-CM

## 2019-08-03 HISTORY — PX: IR GASTROSTOMY TUBE MOD SED: IMG625

## 2019-08-03 LAB — CBC
HCT: 29.1 % — ABNORMAL LOW (ref 36.0–46.0)
Hemoglobin: 9.2 g/dL — ABNORMAL LOW (ref 12.0–15.0)
MCH: 28.1 pg (ref 26.0–34.0)
MCHC: 31.6 g/dL (ref 30.0–36.0)
MCV: 89 fL (ref 80.0–100.0)
Platelets: 270 10*3/uL (ref 150–400)
RBC: 3.27 MIL/uL — ABNORMAL LOW (ref 3.87–5.11)
RDW: 14.8 % (ref 11.5–15.5)
WBC: 10.9 10*3/uL — ABNORMAL HIGH (ref 4.0–10.5)
nRBC: 0.2 % (ref 0.0–0.2)

## 2019-08-03 LAB — GLUCOSE, CAPILLARY
Glucose-Capillary: 135 mg/dL — ABNORMAL HIGH (ref 70–99)
Glucose-Capillary: 153 mg/dL — ABNORMAL HIGH (ref 70–99)
Glucose-Capillary: 167 mg/dL — ABNORMAL HIGH (ref 70–99)
Glucose-Capillary: 148 mg/dL — ABNORMAL HIGH (ref 70–99)
Glucose-Capillary: 111 mg/dL — ABNORMAL HIGH (ref 70–99)
Glucose-Capillary: 146 mg/dL — ABNORMAL HIGH (ref 70–99)

## 2019-08-03 LAB — CULTURE, BLOOD (ROUTINE X 2)
Culture: NO GROWTH
Culture: NO GROWTH
Special Requests: ADEQUATE
Special Requests: ADEQUATE

## 2019-08-03 LAB — BASIC METABOLIC PANEL
Anion gap: 10 (ref 5–15)
BUN: 24 mg/dL — ABNORMAL HIGH (ref 6–20)
CO2: 29 mmol/L (ref 22–32)
Calcium: 10.5 mg/dL — ABNORMAL HIGH (ref 8.9–10.3)
Chloride: 94 mmol/L — ABNORMAL LOW (ref 98–111)
Creatinine, Ser: 0.7 mg/dL (ref 0.44–1.00)
GFR calc Af Amer: >60 mL/min (ref >60)
GFR calc non Af Amer: >60 mL/min (ref >60)
Glucose, Bld: 143 mg/dL — ABNORMAL HIGH (ref 70–99)
Potassium: 3.3 mmol/L — ABNORMAL LOW (ref 3.5–5.1)
Sodium: 133 mmol/L — ABNORMAL LOW (ref 135–145)

## 2019-08-03 LAB — MAGNESIUM: Magnesium: 2.1 mg/dL (ref 1.7–2.4)

## 2019-08-03 MED ORDER — JEVITY 1.2 CAL PO LIQD
1000.0000 mL | ORAL | Status: DC
  Administered 2019-08-04 – 2019-08-31 (×26): 1000 mL
  Filled 2019-08-03 (×47): qty 1000

## 2019-08-03 MED ORDER — ORAL CARE MOUTH RINSE
15.0000 mL | OROMUCOSAL | Status: DC
  Administered 2019-08-03 – 2019-08-31 (×260): 15 mL via OROMUCOSAL

## 2019-08-03 MED ORDER — CEFAZOLIN SODIUM-DEXTROSE 2-4 GM/100ML-% IV SOLN
INTRAVENOUS | Status: AC
  Administered 2019-08-03: 2 g
  Filled 2019-08-03: qty 100

## 2019-08-03 MED ORDER — GLUCAGON HCL RDNA (DIAGNOSTIC) 1 MG IJ SOLR
INTRAMUSCULAR | Status: AC
  Administered 2019-08-03: 0.5 mg
  Filled 2019-08-03: qty 1

## 2019-08-03 MED ORDER — CHLORHEXIDINE GLUCONATE 0.12 % MT SOLN
OROMUCOSAL | Status: AC
  Filled 2019-08-03: qty 15

## 2019-08-03 MED ORDER — FENTANYL CITRATE (PF) 100 MCG/2ML IJ SOLN
INTRAMUSCULAR | Status: AC
  Filled 2019-08-03: qty 2

## 2019-08-03 MED ORDER — OXYCODONE HCL 5 MG PO TABS
5.0000 mg | ORAL_TABLET | Freq: Three times a day (TID) | ORAL | Status: DC
  Administered 2019-08-03 – 2019-08-06 (×7): 5 mg
  Filled 2019-08-03 (×7): qty 1

## 2019-08-03 MED ORDER — MIDAZOLAM HCL 2 MG/2ML IJ SOLN
INTRAMUSCULAR | Status: AC | PRN
  Administered 2019-08-03: 1 mg via INTRAVENOUS

## 2019-08-03 MED ORDER — LIDOCAINE HCL 1 % IJ SOLN
INTRAMUSCULAR | Status: AC
  Filled 2019-08-03: qty 20

## 2019-08-03 MED ORDER — POTASSIUM CHLORIDE 20 MEQ/15ML (10%) PO SOLN
20.0000 meq | ORAL | Status: AC
  Administered 2019-08-03 (×2): 20 meq
  Filled 2019-08-03 (×3): qty 15

## 2019-08-03 MED ORDER — IOHEXOL 300 MG/ML  SOLN
50.0000 mL | Freq: Once | INTRAMUSCULAR | Status: AC | PRN
  Administered 2019-08-03: 15 mL

## 2019-08-03 MED ORDER — FENTANYL CITRATE (PF) 100 MCG/2ML IJ SOLN
INTRAMUSCULAR | Status: AC | PRN
  Administered 2019-08-03: 50 ug via INTRAVENOUS

## 2019-08-03 MED ORDER — CHLORHEXIDINE GLUCONATE 0.12% ORAL RINSE (MEDLINE KIT)
15.0000 mL | Freq: Two times a day (BID) | OROMUCOSAL | Status: DC
  Administered 2019-08-03 – 2019-08-31 (×55): 15 mL via OROMUCOSAL

## 2019-08-03 MED ORDER — MIDAZOLAM HCL 2 MG/2ML IJ SOLN
INTRAMUSCULAR | Status: AC
  Filled 2019-08-03: qty 2

## 2019-08-03 NOTE — Progress Notes (Signed)
Nutrition Follow-up  DOCUMENTATION CODES:   Obesity unspecified  INTERVENTION:   After PEG placement  Jevity 1.2 @ 60 ml/hr (1440 ml/day)  Provides: 1728 kcal, 80 grams protein, and 1167 ml free water.   NUTRITION DIAGNOSIS:   Inadequate oral intake related to inability to eat as evidenced by NPO status. Ongoing   GOAL:   Patient will meet greater than or equal to 90% of their needs Meeting with TF  MONITOR:   TF tolerance  REASON FOR ASSESSMENT:   Ventilator    ASSESSMENT:   Pt with PMH of HTN, OSA, and prior ICH admitted from home with acute intraparenchymal hemorrhage in R thalamus and basal ganglia with IVH extension s/p EVD.   Per RN pt is tolerating TC.  Per MD can now go to SNF with trach.  Plan for PEG placement.   3/24 cortrak tube placed, tip gastric 3/27 trach placed 3/31 TF on hold for PEG placement   Medications reviewed  Labs reviewed: K+ 3.3 (L) - 20 mEq KCl x 2  CBG's: [id number]  Diet Order:   Diet Order            Diet NPO time specified  Diet effective midnight              EDUCATION NEEDS:   No education needs have been identified at this time  Skin:  Skin Assessment: (bite mark under tongue)  Last BM:  3/30  Height:   Ht Readings from Last 1 Encounters:  07/25/19 5' (1.524 m)    Weight:   Wt Readings from Last 1 Encounters:  08/03/19 71 kg    Ideal Body Weight:  45.4 kg  BMI:  Body mass index is 30.57 kg/m.  Estimated Nutritional Needs:   Kcal:  1650-1850  Protein:  80-100 grams  Fluid:  >1.6 L/day  Cammy Copa., RD, LDN, CNSC See AMiON for contact information

## 2019-08-03 NOTE — Care Management (Signed)
Received a call from [facility] with [facility]. Alvino Chapel received a call from patient's daughter Siren Porrata. Per Alvino Chapel family wants to take patient home with hospice.   NCM left a message for Lashawne Dura [phone]. Awaiting call back.   Ronny Flurry RN

## 2019-08-03 NOTE — Progress Notes (Signed)
STROKE TEAM PROGRESS NOTE   INTERVAL HISTORY Patient remains on trach collar for no more than 2 days and continues to have significant secretions requiring frequent suctioning.  She continues to have dysphagia and has tube feeding and PEG tube is pending by IR hopefully today her tongue continues to have significant swelling.  Blood pressure now controlled off Cleviprex drip  Vitals:   08/03/19 0700 08/03/19 0730 08/03/19 0800 08/03/19 0900  BP: 101/68 101/68 126/69   Pulse: 68 81 70 81  Resp: 19 15 13  (!) 23  Temp:   99.8 F (37.7 C)   TempSrc:   Axillary   SpO2: 99%  95% 99%  Weight:      Height:       CBC:  Recent Labs  Lab 08/02/19 0432 08/03/19 0423  WBC 12.0* 10.9*  HGB 9.4* 9.2*  HCT 29.9* 29.1*  MCV 89.3 89.0  PLT 240 270   Basic Metabolic Panel:  Recent Labs  Lab 08/02/19 0432 08/03/19 0423  NA 136 133*  K 3.5 3.3*  CL 99 94*  CO2 27 29  GLUCOSE 151* 143*  BUN 14 24*  CREATININE 0.46 0.70  CALCIUM 10.4* 10.5*  MG 2.0 2.1   IMAGING past 24 hours No results found.   PHYSICAL EXAM    Obese middle-aged lady who is s/p tracheostomy and sedated with mechanical ventilatory support. She has severe enlargement of the tongue. . Afebrile. Head is nontraumatic. Neck is supple without bruit.    Cardiac exam no murmur or gallop. Lungs are clear to auscultation. Distal pulses are well felt. Neurological Exam : Patient is drowsy.  She opens eyes partially to sternal rub but does not follow any commands and remains globally aphasic.  There is tonic downward eye deviation.  Pupils 3 mm sluggishly reactive.  Doll's eye movements are sluggish.  Corneal flexes are present.  There is some spontaneous respiratory effort.  Cough and gag are present.  There is no spontaneous extremity movements is trace withdrawal in lower extremities to pain.  There is some intermittent myoclonic tremor in the right upper extremity.   ASSESSMENT/PLAN Haley Hernandez is a 59 y.o. female  with history of ICH 7 yrs ago, HTN found unresponsive. Found to have L sided hemiplegia, R gaze, SBP 250.  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 Moderate IVH. Mass effect w/ partial effacment R lateral and 3rd ventricles. 22mm L midline shift. Suspicious for early hydrocephalus.   Repeat CT head 3/16 interval L frontal lobe EVD w/ tip at R thalamus. Interval increased in R ICH now w/ 2mm L midline shift. IVH extended into B lateral, 3rd and 4th ventricles. Prominent B superior ophthalmic vein suggestion increased ICP.   CT repeat 3/17 - stable MLS 45mm, hematoma and hydrocephalus  CT repeat 3/20 - stable hematoma and edeam. Decreased hydrocephalus.   CT repeat 3/25 evolution R thalamic ICH w/ decreasing clot. Increased ventricular volume s/p EVD removal. 1cm midline shift. Sinusitis.  CT repeat 3/26 - Stable volume of right thalamic and intraventricular hemorrhage. Ventriculomegaly with 2 mm of increased lateral ventricular diameter when compared yesterday.  CT repeat 3/28 - Slight interval decrease in size of right thalamic hemorrhage, but with similar degree of surrounding vasogenic edema. Localized 9 mm right-to-left shift relatively unchanged. Intraventricular extension with small volume intraventricular blood, stable. Lateral ventriculomegaly not  significantly changed.  2D Echo EF 60-65%.No arterial finding for bleed. Severe LVH. 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 Stroke will continue to follow.   Dr. Pearlean Brownie updated  daughter via telephone  Cytotoxic Cerebral Edema Obstructive hydrocephalus, resolved Induced Hypernatremia, resolved  EVD placed 3/15 [name])  CT 3/16 - hematoma mild enlargement  CT 3/17 - stable hematoma and MLS  CT 3/20 - stable hematoma and edema with decreased hydrocephalus  CT repeat 3/25 evolution R thalamic ICH w/ decreasing clot. Increased ventricular volume s/p EVD removal. 1cm midline shift.   CT repeat 3/26 - Stable volume of right thalamic and intraventricular hemorrhage. Ventriculomegaly with 2 mm of increased lateral ventricular diameter when compared yesterday.  Off 3% now, on NS @ 25  Na 133  EVD removed 3/24  Acute Respiratory Failure d/t ICH Aspiration PNA Fever, resolved  Intubated in ED  T-max afebrile  WBCs 10.9  CCM on board  Sedation, off propofol, on fentanyl  Pneumonia - (ceftriaxone and metronidazole 3/16>>3/20)   Cefepime 07/23/19>>07/27/19 (EVD out)  CXR 3/24 Pulmonary vascular congestion and mild interstitial edema.  CXR 3/26 - Left lower lobe atelectasis/infiltrate. Small left pleural effusion again noted.  UA neg; UCx 3/24 - no growth - final   Blood culture 07/27/19 no growth x 2 days  Respiratory culture - 3/26 - no growth 2 days  Blood Cultures - 3/26 - no growth   Urine cultures - 3/27 - no growth   CXR 07/30/19 - Unchanged small to moderate left pleural effusion with associated atelectasis/airspace disease. Obscuration of the right hemidiaphragm which may reflect a small pleural effusion and or atelectasis/airspace disease.  Levaquin IV 500 mg daily started 3/27>>3/31 HCAP  Tracheostomy placement - 3/27, sutures out 4/1  Off vent x 48  Hypertensive Emergency Tongue Swellling  SBP > 250 on arrival  Home meds:  Coreg, HCTZ and losartan . Back on Cleviprex overnight -> wean . On losartan 50 bid, Coreg 25 bid,  catapres 0.2->0.3 tid, hydralazine 100 q6h, lasix 40 q4h . Stop CCB (amlodipine) d/t tongue swelling. Not  on ACE. Consider stopping ARB as well if does not resolve.  . CCM added standing oxycodone . SBP goal <180   . Long-term BP goal normotensive  Dysphagia . Secondary to stroke . NPO . Cortrak . On tube feeds @ 50 . On IVF @ 25 . For PEG this week  Other Stroke Risk Factors  Obesity, Body mass index is 30.57 kg/m., recommend weight loss, diet and exercise as appropriate   Hx stroke/TIA  ICH 7 yrs ago. No data available in EPIC  obstructive sleep apnea  Other Active Problems  Hypokalemia 3.3 - CCM managing   Mild anemia of critical illness - Hb - 9.2  RUE rhythmic movement-doubt focal seizures or focal status epilepticus as continuous video-EEG monitoring -showed no seizures during- arm twitching seen during EEG without associated EEG change, low suspicion for motor seizures   Hospital day # 16  Continue weaning off tracheostomy as per CCM.  PEG tube for IR hopefully today.  I had a long discussion over the telephone with the patient's daughter and gave her an  update about her condition and answered questions.  Discussed with Dr. Elsworth Soho critical care medicine This patient is critically ill and at significant risk of neurological worsening, death and care requires constant monitoring of vital signs, hemodynamics,respiratory and cardiac monitoring, extensive review of multiple databases, frequent neurological assessment, discussion with family, other specialists and medical decision making of high complexity.I have made any additions or clarifications directly to the above note.This critical care time does not reflect procedure time, or teaching time or supervisory time of PA/NP/Med Resident etc but could involve care discussion time.  I spent 30 minutes of neurocritical care time  in the care of  this patient.       Antony Contras, MD  To contact Stroke Continuity provider, please refer to [email or web address] After hours, contact General Neurology

## 2019-08-03 NOTE — Progress Notes (Signed)
NAME:  Haley Hernandez, MRN:  [id number], DOB:  01/31/1961, LOS: 84 ADMISSION DATE:  07/18/2019, CONSULTATION DATE:  07/19/2019 REFERRING MD:  Dr. Lorraine Lax, CHIEF COMPLAINT:  ICH  Brief History   59 year old female with prior history ICH 7 yrs ago and HTN found unresponsive at home found to left hemiplegia and right gaze with SBP 250.  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  Intubated in ER for airway protection.  NSGY consulted and EVD placed.  Placed on cleviprex for blood pressure control and admitted to Neurology.  PCCM consulted for ventilator management.   Past Medical History  HTN OSA ICH (?2014)  Significant Hospital Events   3/15 Admitted, ventriculostomy placed 3/23 ventriculostomy removed 3/24 Fever 101F  Consults:  NSGY  Procedures:  3/15 ETT >> 3/27 Trach 3/28 >> 3/15 EVD >> out 3/16 PICC >>   Significant Diagnostic Tests:  3/15 CT head >> 3.2 x 2.9 x 4.2 cm ICH in posterior Rt BG and Rt thalamus, mod IV extension, 4 mm Lt shift 3/16 Echo >> EF 60 to 65%, severe concentric LVH 3/17 CT head >> 7 mm shift 3/17 EEG >> generalized slowing 3/20 CT head >> decreased hydrocephalus 3/24 CT head >>> 1. Expected evolution of right thalamic hematoma with decreasing intraventricular clot.2. Increased ventricular volume after ventriculostomy removal. 3. 1 cm of midline shift.4. Sinusitis  3/26 stable amount of blood, increasing ventricular volume  Micro Data:  SARS CoV2 PCR 3/15 >> negative Sputum 3/17 >> negative Sputum 3/20 >> negative Blood 3/20 >>neg Blood, urine 3/26 >>ng  ID/ cultures/data   Rocephin 3/16 >> 3/19 Flagyl 3/16 >> 3/20 Cefepime 3/20 >> 3/23  Interim history/subjective:   Blood pressure better controlled, off Cleviprex drip Afebrile On trach collar  Objective   Blood pressure 126/69, pulse 70, temperature 99.8 F (37.7 C), temperature source Axillary, resp. rate 13, height 5'  (1.524 m), weight 71 kg, SpO2 95 %.    FiO2 (%):  [28 %] 28 %   Intake/Output Summary (Last 24 hours) at 08/03/2019 0912 Last data filed at 08/03/2019 0800 Gross per 24 hour  Intake 824.83 ml  Output 2650 ml  Net -1825.17 ml   Filed Weights   08/01/19 0500 08/02/19 0500 08/03/19 0500  Weight: 75.5 kg 73 kg 71 kg     GEN: ill appearing woman on vent HEENT: Massively enlarged tongue, trach in place with mild bloody secretions CV: RRR, ext warm PULM: scattered rhonci, no accessory muscle use GI: Soft, +BS, rectal tube in place EXT: Trace edema NEURO: left and downward gaze preference, will not withdraw for me x 4, L arm and leg move purposefully but does not follow commands PSYCH: RASS 0 SKIN: no rashes  Lab 3/31 shows mild hyponatremia, hypokalemia, no leukocytosis, stable anemia  Resolved Hospital Problem list     Assessment & Plan:   Acute hypoxic respiratory failure  Tracheostomy Likely also has OSA. 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 - per stroke team, neurosurgery  R arm twitching - no EEG correlate for seizure  Aspiration  pneumonia - complete 5 days levaquin  Anasarca-good response to Lasix given 3/30, hypokalemia will be repleted, diurese intermittently  Hyperglycemia. - SSI, goal 100-180  Anemia of critical illness. - f/u CBC - transfuse for Hb < 7 or significant bleeding  GoC-does not need LTAC since of the vent, can go to trach SNF  Best practice:  Diet: TF on hold for potential PEG today DVT prophylaxis: heparin GI prophylaxis: Pepcid Mobility: BR Code  Status: Full  Dispo: ICU   PCCM to follow intermittently  Cyril Mourning MD. FCCP. [facility]  If no response to pager , please call [phone]   08/03/2019

## 2019-08-03 NOTE — Procedures (Signed)
Interventional Radiology Procedure Note  Procedure: 24 fr gtube with fluoro  Complications: None  Estimated Blood Loss: min  Findings: Confirmed in the stomach

## 2019-08-04 DIAGNOSIS — I615 Nontraumatic intracerebral hemorrhage, intraventricular: Secondary | ICD-10-CM | POA: Diagnosis present

## 2019-08-04 DIAGNOSIS — J690 Pneumonitis due to inhalation of food and vomit: Secondary | ICD-10-CM | POA: Diagnosis not present

## 2019-08-04 DIAGNOSIS — G9341 Metabolic encephalopathy: Secondary | ICD-10-CM | POA: Diagnosis not present

## 2019-08-04 DIAGNOSIS — G4733 Obstructive sleep apnea (adult) (pediatric): Secondary | ICD-10-CM | POA: Diagnosis present

## 2019-08-04 DIAGNOSIS — T461X5A Adverse effect of calcium-channel blockers, initial encounter: Secondary | ICD-10-CM | POA: Diagnosis not present

## 2019-08-04 DIAGNOSIS — E876 Hypokalemia: Secondary | ICD-10-CM | POA: Diagnosis not present

## 2019-08-04 DIAGNOSIS — E878 Other disorders of electrolyte and fluid balance, not elsewhere classified: Secondary | ICD-10-CM | POA: Diagnosis not present

## 2019-08-04 DIAGNOSIS — R1312 Dysphagia, oropharyngeal phase: Secondary | ICD-10-CM | POA: Diagnosis not present

## 2019-08-04 DIAGNOSIS — R509 Fever, unspecified: Secondary | ICD-10-CM | POA: Diagnosis not present

## 2019-08-04 DIAGNOSIS — Z515 Encounter for palliative care: Secondary | ICD-10-CM | POA: Diagnosis not present

## 2019-08-04 DIAGNOSIS — T783XXD Angioneurotic edema, subsequent encounter: Secondary | ICD-10-CM | POA: Diagnosis not present

## 2019-08-04 DIAGNOSIS — I610 Nontraumatic intracerebral hemorrhage in hemisphere, subcortical: Secondary | ICD-10-CM | POA: Diagnosis not present

## 2019-08-04 DIAGNOSIS — I629 Nontraumatic intracranial hemorrhage, unspecified: Secondary | ICD-10-CM | POA: Diagnosis not present

## 2019-08-04 DIAGNOSIS — R4701 Aphasia: Secondary | ICD-10-CM | POA: Diagnosis present

## 2019-08-04 DIAGNOSIS — J9601 Acute respiratory failure with hypoxia: Secondary | ICD-10-CM | POA: Diagnosis present

## 2019-08-04 DIAGNOSIS — Z23 Encounter for immunization: Secondary | ICD-10-CM | POA: Diagnosis not present

## 2019-08-04 DIAGNOSIS — T783XXA Angioneurotic edema, initial encounter: Secondary | ICD-10-CM | POA: Diagnosis not present

## 2019-08-04 DIAGNOSIS — Z930 Tracheostomy status: Secondary | ICD-10-CM | POA: Diagnosis not present

## 2019-08-04 DIAGNOSIS — G8194 Hemiplegia, unspecified affecting left nondominant side: Secondary | ICD-10-CM | POA: Diagnosis present

## 2019-08-04 DIAGNOSIS — G936 Cerebral edema: Secondary | ICD-10-CM | POA: Diagnosis present

## 2019-08-04 DIAGNOSIS — G9389 Other specified disorders of brain: Secondary | ICD-10-CM | POA: Diagnosis present

## 2019-08-04 DIAGNOSIS — Y92230 Patient room in hospital as the place of occurrence of the external cause: Secondary | ICD-10-CM | POA: Diagnosis not present

## 2019-08-04 DIAGNOSIS — Z7189 Other specified counseling: Secondary | ICD-10-CM | POA: Diagnosis not present

## 2019-08-04 DIAGNOSIS — G911 Obstructive hydrocephalus: Secondary | ICD-10-CM | POA: Diagnosis present

## 2019-08-04 DIAGNOSIS — E870 Hyperosmolality and hypernatremia: Secondary | ICD-10-CM | POA: Diagnosis not present

## 2019-08-04 DIAGNOSIS — I618 Other nontraumatic intracerebral hemorrhage: Secondary | ICD-10-CM | POA: Diagnosis present

## 2019-08-04 DIAGNOSIS — I639 Cerebral infarction, unspecified: Secondary | ICD-10-CM | POA: Diagnosis present

## 2019-08-04 DIAGNOSIS — Z789 Other specified health status: Secondary | ICD-10-CM | POA: Diagnosis not present

## 2019-08-04 DIAGNOSIS — Z20822 Contact with and (suspected) exposure to covid-19: Secondary | ICD-10-CM | POA: Diagnosis present

## 2019-08-04 DIAGNOSIS — E871 Hypo-osmolality and hyponatremia: Secondary | ICD-10-CM | POA: Diagnosis not present

## 2019-08-04 DIAGNOSIS — R1310 Dysphagia, unspecified: Secondary | ICD-10-CM | POA: Diagnosis present

## 2019-08-04 DIAGNOSIS — R739 Hyperglycemia, unspecified: Secondary | ICD-10-CM | POA: Diagnosis present

## 2019-08-04 DIAGNOSIS — R220 Localized swelling, mass and lump, head: Secondary | ICD-10-CM | POA: Diagnosis not present

## 2019-08-04 DIAGNOSIS — R4182 Altered mental status, unspecified: Secondary | ICD-10-CM | POA: Diagnosis not present

## 2019-08-04 DIAGNOSIS — J9621 Acute and chronic respiratory failure with hypoxia: Secondary | ICD-10-CM | POA: Diagnosis not present

## 2019-08-04 DIAGNOSIS — I160 Hypertensive urgency: Secondary | ICD-10-CM | POA: Diagnosis present

## 2019-08-04 DIAGNOSIS — R29810 Facial weakness: Secondary | ICD-10-CM | POA: Diagnosis present

## 2019-08-04 LAB — CBC
HCT: 30.4 % — ABNORMAL LOW (ref 36.0–46.0)
Hemoglobin: 9.7 g/dL — ABNORMAL LOW (ref 12.0–15.0)
MCH: 28.5 pg (ref 26.0–34.0)
MCHC: 31.9 g/dL (ref 30.0–36.0)
MCV: 89.4 fL (ref 80.0–100.0)
Platelets: 322 10*3/uL (ref 150–400)
RBC: 3.4 MIL/uL — ABNORMAL LOW (ref 3.87–5.11)
RDW: 14.8 % (ref 11.5–15.5)
WBC: 11.5 10*3/uL — ABNORMAL HIGH (ref 4.0–10.5)
nRBC: 0 % (ref 0.0–0.2)

## 2019-08-04 LAB — BASIC METABOLIC PANEL
Anion gap: 11 (ref 5–15)
BUN: 20 mg/dL (ref 6–20)
CO2: 28 mmol/L (ref 22–32)
Calcium: 10.7 mg/dL — ABNORMAL HIGH (ref 8.9–10.3)
Chloride: 96 mmol/L — ABNORMAL LOW (ref 98–111)
Creatinine, Ser: 0.66 mg/dL (ref 0.44–1.00)
GFR calc Af Amer: >60 mL/min (ref >60)
GFR calc non Af Amer: >60 mL/min (ref >60)
Glucose, Bld: 133 mg/dL — ABNORMAL HIGH (ref 70–99)
Potassium: 3.7 mmol/L (ref 3.5–5.1)
Sodium: 135 mmol/L (ref 135–145)

## 2019-08-04 LAB — MAGNESIUM: Magnesium: 2.1 mg/dL (ref 1.7–2.4)

## 2019-08-04 LAB — GLUCOSE, CAPILLARY
Glucose-Capillary: 132 mg/dL — ABNORMAL HIGH (ref 70–99)
Glucose-Capillary: 111 mg/dL — ABNORMAL HIGH (ref 70–99)
Glucose-Capillary: 140 mg/dL — ABNORMAL HIGH (ref 70–99)
Glucose-Capillary: 174 mg/dL — ABNORMAL HIGH (ref 70–99)
Glucose-Capillary: 182 mg/dL — ABNORMAL HIGH (ref 70–99)
Glucose-Capillary: 202 mg/dL — ABNORMAL HIGH (ref 70–99)

## 2019-08-04 NOTE — Care Management (Signed)
Received another call from [place] with [facility]. Per Alvino Chapel patient's daughter Haley Hernandez has spoken to her again regarding home with hospice and states patient has insurance as of today.   NCM called [place] [phone] again and let message. NCM also called daughter Haley Hernandez and left message.   Bedside nurse notified NCM that Haley Hernandez was at bedside.   Spoken with [employer]. Discussed messages that NCM received from [place].   Haley Hernandez is a Lawyer and can help with patient at home, and Haley Hernandez is a NP.  Haley Hernandez asked if they take their mother home with hospice , could she also received rehab. NCM discussed home health vs hospice care. Haley Hernandez asked if her mother could go to a rehab center. Discussed SNF process.   Haley Hernandez called Haley Hernandez was unable to answer phone. Haley Hernandez will discuss with Haley Hernandez options. Both daughters have NCM direct cell phone number. WIll need patient's insurance information.

## 2019-08-04 NOTE — Progress Notes (Signed)
Referring Physician(s): Dr. Adaline Sill  Supervising Physician: Ruel Favors  Patient Status:  [facility] - In-pt  Chief Complaint:  F/U Gastrostomy tube placement  Brief History:  Haley Hernandez is a 59 y.o. 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  Cytotoxic cerebra edema Obstructive hydrocephalus Ventriculomegaly Acute resp failure-- ICH Aspiration PNA Fevers-- resolving On vent/trach  Dysphagia Tongue edematous  S/P Pull Through gastrostomy tube placement yesterday by Dr. Miles Costain.  Subjective:  Unresponsive  Allergies: Penicillins  Medications: Prior to Admission medications   Medication Sig Start Date End Date Taking? Authorizing Provider  albuterol (PROAIR HFA) 108 (90 Base) MCG/ACT inhaler Inhale 1 puff into the lungs every 6 (six) hours as needed for wheezing or shortness of breath.   Yes [provider]  carvedilol (COREG) 12.5 MG tablet Take 12.5 mg by mouth 2 (two) times daily. 05/06/19  Yes [provider]  chlorthalidone (HYGROTON) 25 MG tablet Take 12.5 mg by mouth daily.   Yes [provider]  FEROSUL 325 (65 Fe) MG tablet Take 325 mg by mouth daily. 04/30/19  Yes [provider]  losartan (COZAAR) 50 MG tablet Take 50 mg by mouth daily.   Yes [provider]     Vital Signs: BP (!) 82/53   Pulse 88   Temp 99.3 F (37.4 C) (Axillary)   Resp (!) 24   Ht 5' (1.524 m)   Wt 69.7 kg   SpO2 98%   BMI 30.01 kg/m   Physical Exam Vitals reviewed.  HENT:     Mouth/Throat:     Comments: Tongue remains edematous Cardiovascular:     Rate and Rhythm: Normal rate.  Abdominal:     Palpations: Abdomen is soft.     Comments: Gtube in place. Tube feeds started, no issues.  Skin:    General: Skin is warm and dry.  Neurological:     Mental Status: Mental status is at baseline.     Imaging: IR GASTROSTOMY TUBE MOD SED  Result Date:  08/03/2019 INDICATION: Dysphagia, intracranial hemorrhage, acute respiratory failure EXAM: FLUOROSCOPIC 24 FRENCH PULL-THROUGH GASTROSTOMY Date:  08/03/2019 08/03/2019 3:21 pm Radiologist:  M. Ruel Favors, MD Guidance:  Fluoroscopic MEDICATIONS: Ancef 2 g within 1 hour of the procedure; Antibiotics were administered within 1 hour of the procedure. Glucagon 0.5 mg IV ANESTHESIA/SEDATION: Versed 1.0 mg IV; Fentanyl 50 mcg IV Moderate Sedation Time:  12 minutes The patient was continuously monitored during the procedure by the interventional radiology nurse under my direct supervision. CONTRAST:  50mL OMNIPAQUE IOHEXOL 300 MG/ML SOLN - administered into the gastric lumen. FLUOROSCOPY TIME:  Fluoroscopy Time: 2 minutes 42 seconds (10 mGy). COMPLICATIONS: None immediate. PROCEDURE: Informed consent was obtained from the patient's family following explanation of the procedure, risks, benefits and alternatives. The patient understands, agrees and consents for the procedure. All questions were addressed. A time out was performed. Maximal barrier sterile technique utilized including caps, mask, sterile gowns, sterile gloves, large sterile drape, hand hygiene, and betadine prep. The left upper quadrant was sterilely prepped and draped. An oral gastric catheter was inserted into the stomach under fluoroscopy. The existing nasogastric feeding tube was removed. Air was injected into the stomach for insufflation and visualization under fluoroscopy. The air distended stomach was confirmed beneath the anterior abdominal wall in the frontal and lateral projections. Under sterile conditions and local anesthesia, a 17 gauge trocar needle was utilized to access the stomach percutaneously beneath  the left subcostal margin. Needle position was confirmed within the stomach under biplane fluoroscopy. Contrast injection confirmed position also. A single T tack was deployed for gastropexy. Over an Amplatz guide wire, a 9-French sheath was  inserted into the stomach. A snare device was utilized to capture the oral gastric catheter. The snare device was pulled retrograde from the stomach up the esophagus and out the oropharynx. The 24 pull-through gastrostomy was connected to the snare device and pulled antegrade through the oropharynx down the esophagus into the stomach and then through the percutaneous tract external to the patient. The gastrostomy was assembled externally. Contrast injection confirms position in the stomach. Images were obtained for documentation. The patient tolerated procedure well. No immediate complication. IMPRESSION: Fluoroscopic insertion of a 24 "pull-through" gastrostomy. Electronically Signed   By: Judie Petit.  Shick M.D.   On: 08/03/2019 15:33   EEG LTVM - Continuous Bedside W/ Video Includes Portable EEG Read  Result Date: 08/01/2019 Charlsie Quest, MD     08/01/2019 11:22 AM Patient Name: Haley Hernandez MRN: [id number] Epilepsy Attending: Charlsie Quest Referring Physician/Provider: Dr Weston Settle Duration: 07/31/2019 1540 to 08/01/2019 1055 Total duration of study: 19 hours  Patient history: 58yo F with R basal ganglia ICH. Noted to have RUE rhythmic movement. EEG to evaluate for seizure  Level of alertness: awake/lethargic  AEDs during EEG study: None  Technical aspects: This EEG study was done with scalp electrodes positioned according to the 10-20 International system of electrode placement. Electrical activity was acquired at a sampling rate of 500Hz  and reviewed with a high frequency filter of 70Hz  and a low frequency filter of 1Hz . EEG data were recorded continuously and digitally stored.  DESCRIPTION: During awake state, no clear posterior dominant rhythm was seen.  EEG showed continuous generalized and maximal right frontotemporal region 3-6hz  polymorphic theta-delta slowing. Hyperventilation and photic stimulation were not performed. Event button was pressed multiple times for right arm semirhythmic  intermittent twitching on 3/28/2021at 1801, 1804, 1859, 1900, 1902, 1944, 2012, 2059 and on 08/01/2019 at 0120, 0310, 0419 and 0715.  Concomitant EEG before, during and after the event did not show any EEG change to suggest seizure.  ABNORMALITY - Continuous slow, generalized and lateralized right frontotemporal region  IMPRESSION: This study is suggestive of cortical dysfunction in the right frontotemporal region likely secondary to underlying hemorrhage as well as moderate diffuse encephalopathy, nonspecific etiology. No seizures or epileptiform discharges were seen throughout the recording. Multiple events of right arm twitching were recorded as described above without concomitant EEG change.  Although focal motor seizures may not be seen on scalp EEG, the semiology of the events makes it less suspicious for focal motor seizures.  Clinical correlation is recommended. [name] O Yadav    Labs:  CBC: Recent Labs    08/01/19 0636 08/02/19 0432 08/03/19 0423 08/04/19 0500  WBC 9.0 12.0* 10.9* 11.5*  HGB 8.6* 9.4* 9.2* 9.7*  HCT 28.1* 29.9* 29.1* 30.4*  PLT 202 240 270 322    COAGS: Recent Labs    07/18/19 2035  INR 1.0  APTT 23*    BMP: Recent Labs    08/01/19 0636 08/02/19 0432 08/03/19 0423 08/04/19 0500  NA 140 136 133* 135  K 3.3* 3.5 3.3* 3.7  CL 109 99 94* 96*  CO2 24 27 29 28   GLUCOSE 165* 151* 143* 133*  BUN 16 14 24* 20  CALCIUM 9.1 10.4* 10.5* 10.7*  CREATININE 0.35* 0.46 0.70 0.66  GFRNONAA >60 >60 >60 >60  GFRAA >60 >60 >60 >60    LIVER FUNCTION TESTS: Recent Labs    07/18/19 2035 07/19/19 0123 07/27/19 0558  BILITOT 0.9  --  0.6  AST 21  --  15  ALT 20  --  18  ALKPHOS 62  --  31*  PROT 8.3*  --  6.6  ALBUMIN 4.1 4.0 2.8*    Assessment and Plan:  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  Cytotoxic cerebra edema.  Dysphagia with edematous tongue.  S/P Pull Through gastrostomy  tube placement yesterday by Dr. Annamaria Boots.  Routine G-tube care.   Electronically Signed: Murrell Redden, PA-C 08/04/2019, 12:04 PM    I spent a total of 15 Minutes at the the patient's bedside AND on the patient's hospital floor or unit, greater than 50% of which was counseling/coordinating care for gastrostomy tube placement.

## 2019-08-04 NOTE — Progress Notes (Signed)
Physical Therapy Treatment Patient Details Name: Haley Hernandez MRN: [id number] DOB: 07-11-1960 Today's Date: 08/04/2019    History of Present Illness 59 y.o. female with past medical history of intracerebral hemorrhage 7 years ago, hypertension presents to the emergency department after family found her unresponsive with L hemipleiga and R gaze deviation. 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 Pt intubated and with EVD placed on 3/15. EVD removed 3/24. tracheostomy 3/27; EEG 3/29 moderate diffuse encephalopathy with no seizures (multiple events of RUE twitching occurred during EEG without EEG changes)    PT Comments    Patient with less eye opening today compared to previous visit. Assisted with +2 total assist to dangle at EOB with OT with no righting reactions elicited. Pt required assist to hold her head in neutral with one instance of attempting to turn her head to her left--which was not provoked/requested by therapists. Otherwise there was no AROM noted throughout trunk and extremities. Patient has thus far not demonstrated the ability to participate and benefit from PT. If no improvement noted at next visit, will likely discharge from PT.    Follow Up Recommendations  SNF;LTACH;Supervision/Assistance - 24 hour(SNF vs LTACH pending ability to participate with PT)     Equipment Recommendations  (TBD)    Recommendations for Other Services       Precautions / Restrictions Precautions Precautions: Fall;Other (comment) Precaution Comments: SBP<160, HOB>30 degrees Restrictions Weight Bearing Restrictions: No    Mobility  Bed Mobility Overal bed mobility: Needs Assistance Bed Mobility: Rolling;Sidelying to Sit;Sit to Sidelying Rolling: Total assist;+2 for physical assistance Sidelying to sit: Total assist;+2 for physical assistance     Sit to sidelying: Total assist;+2 for physical assistance General bed mobility  comments: Total A +2 for trunk control and managing BLEs; no active assist provided by patient  Transfers                    Ambulation/Gait                 Stairs             Wheelchair Mobility    Modified Rankin (Stroke Patients Only)       Balance Overall balance assessment: Needs assistance Sitting-balance support: Bilateral upper extremity supported;Feet unsupported Sitting balance-[name]: Zero Sitting balance - Comments: total A, posterior lean; no righting responses noted when allowed to lean; assist to hold head up for OT to assess tracking/vision Postural control: Posterior lean                                  Cognition Arousal/Alertness: Lethargic(awakens with verbal stimulation) Behavior During Therapy: Flat affect Overall Cognitive Status: Difficult to assess                                 General Comments: on trach collar; significant angioedema with inability to attempt lipspeaking      Exercises      General Comments General comments (skin integrity, edema, etc.): Daughter present throughout. BP supine at begining of session 118/72 (87) and supine at end of session 93/58 (68). SpO2 90s on 28% FiO2 via trach collar..       Pertinent Vitals/Pain Pain Assessment: Faces Faces Pain Scale: No hurt Pain Location: generalized Pain Descriptors / Indicators: Grimacing Pain Intervention(s): Monitored during session;Limited activity  within patient's tolerance;Repositioned    Home Living                      Prior Function            PT Goals (current goals can now be found in the care plan section) Acute Rehab PT Goals Patient Stated Goal: Pt unable to participate in goal setting, to improve strength and reduce caregiver burden Time For Goal Achievement: 08/12/19 Potential to Achieve Goals: Poor Progress towards PT goals: Not progressing toward goals - comment    Frequency    Min  3X/week      PT Plan Current plan remains appropriate    Co-evaluation PT/OT/SLP Co-Evaluation/Treatment: Yes Reason for Co-Treatment: Complexity of the patient's impairments (multi-system involvement);For patient/therapist safety;To address functional/ADL transfers PT goals addressed during session: Mobility/safety with mobility;Balance OT goals addressed during session: ADL's and self-care      AM-PAC PT "6 Clicks" Mobility   Outcome Measure  Help needed turning from your back to your side while in a flat bed without using bedrails?: Total Help needed moving from lying on your back to sitting on the side of a flat bed without using bedrails?: Total Help needed moving to and from a bed to a chair (including a wheelchair)?: Total Help needed standing up from a chair using your arms (e.g., wheelchair or bedside chair)?: Total Help needed to walk in hospital room?: Total Help needed climbing 3-5 steps with a railing? : Total 6 Click Score: 6    End of Session Equipment Utilized During Treatment: Oxygen Activity Tolerance: Other (comment)(limited by decr ability to participate) Patient left: in bed;with call bell/phone within reach;with bed alarm set;with family/visitor present;with SCD's reapplied Nurse Communication: Need for lift equipment PT Visit Diagnosis: Other symptoms and signs involving the nervous system (R29.898);Muscle weakness (generalized) (M62.81);Hemiplegia and hemiparesis Hemiplegia - Right/Left: Left Hemiplegia - caused by: Nontraumatic intracerebral hemorrhage     Time: 1133-1202 PT Time Calculation (min) (ACUTE ONLY): 29 min  Charges:  $Neuromuscular Re-education: 8-22 mins                      Jerolyn Center, PT Pager [phone]    [name] 08/04/2019, 3:25 PM

## 2019-08-04 NOTE — Progress Notes (Addendum)
Occupational Therapy Treatment Patient Details Name: Haley Hernandez MRN: [id number] DOB: [birth year] Today's Date: 08/04/2019    History of present illness 59 y.o. female with past medical history of intracerebral hemorrhage 7 years ago, hypertension presents to the emergency department after family found her unresponsive with L hemipleiga and R gaze deviation. 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 Pt intubated and with EVD placed on 3/15. EVD removed 3/24. tracheostomy 3/27; EEG 3/29 moderate diffuse encephalopathy with no seizures (multiple events of RUE twitching occurred during EEG without EEG changes)   OT comments  Upon arrival, pt supine in bed with head turned to left and daughter at bedside. Pt opening her eyes to name but not making eye contact. Pt requiring Total A +2 for bed mobility and to maintain sitting balance at EOB. Pt requiring Total hand over hand to bring wash clothe to face for grooming. Pt with no active movement at BUE/BLEs. Facilitating neck ROM left to right and attempting visual tracking. Pt continue to present with dysconjugate gaze and head turn to left. Continue to recommend dc to SNF and will continue to follow acutely as admitted.    Follow Up Recommendations  SNF;Supervision/Assistance - 24 hour    Equipment Recommendations  Other (comment)(Defer to next venue)    Recommendations for Other Services PT consult    Precautions / Restrictions Precautions Precautions: Fall;Other (comment) Precaution Comments: SBP<160, HOB>30 degrees Restrictions Weight Bearing Restrictions: No       Mobility Bed Mobility Overal bed mobility: Needs Assistance Bed Mobility: Rolling;Sidelying to Sit;Sit to Sidelying Rolling: Total assist;+2 for physical assistance Sidelying to sit: Total assist;+2 for physical assistance     Sit to sidelying: Total assist;+2 for physical assistance General bed mobility comments:  Total A +2 for trunk control and managing BLEs.  Transfers                      Balance Overall balance assessment: Needs assistance Sitting-balance support: Bilateral upper extremity supported;Feet unsupported Sitting balance-[name]: Zero Sitting balance - Comments: total A, posterior lean Postural control: Posterior lean                                 ADL either performed or assessed with clinical judgement   ADL Overall ADL's : Needs assistance/impaired     Grooming: Wash/dry face;Total assistance;Sitting Grooming Details (indicate cue type and reason): Hand over hand to wash her face with warm wash clothe. Pt with slight head turns in reaction. Total A for maintaining sitting balance and head elevated                               General ADL Comments: Pt continues to require Total A for ADLs, balance sitting at EOB, and bed mobility.      Vision   Vision Assessment?: Vision impaired- to be further tested in functional context;Yes Eye Alignment: Impaired (comment) Ocular Range of Motion: Impaired-to be further tested in functional context Alignment/Gaze Preference: Head turned(Head turn to left) Tracking/Visual Pursuits: Unable to hold eye position out of midline;Impaired - to be further tested in functional context Diplopia Assessment: Other (comment)(Dysconjugate gaze. Will continue to assess) Additional Comments: Pt continues to present with dyconjugate gaze. With covering of either eye, pt will close to other eye. Head turn to left and decreased tracking to left  Perception     Praxis      Cognition Arousal/Alertness: Lethargic(awakens with verbal stimulation) Behavior During Therapy: Flat affect Overall Cognitive Status: Difficult to assess                                 General Comments: on trach collar; significant angioedema with inability to attempt lipspeaking        Exercises     Shoulder  Instructions       General Comments Daughter present throughout. BP supine at begining of session 118/72 (87) and supine at end of session 93/58 (68). SpO2 90s on 5L @ 28% FiO2 via trach collar..     Pertinent Vitals/ Pain       Pain Assessment: Faces Faces Pain Scale: No hurt Pain Location: generalized Pain Descriptors / Indicators: Grimacing Pain Intervention(s): Monitored during session;Limited activity within patient's tolerance;Repositioned  Home Living                                          Prior Functioning/Environment              Frequency  Min 2X/week        Progress Toward Goals  OT Goals(current goals can now be found in the care plan section)  Progress towards OT goals: Not progressing toward goals - comment  Acute Rehab OT Goals Patient Stated Goal: Pt unable to participate in goal setting, to improve strength and reduce caregiver burden OT Goal Formulation: Patient unable to participate in goal setting Time For Goal Achievement: 08/12/19 Potential to Achieve Goals: Good ADL Goals Pt Will Perform Grooming: with mod assist;sitting Additional ADL Goal #1: Pt will perform bed mobility with Mod A +2 in preparation for ADLs Additional ADL Goal #2: Pt will tolerate sitting at EOB with Mod A in preparation for ADLs Additional ADL Goal #3: Pt will follow one step commands during ADLs with Mod cues  Plan Discharge plan remains appropriate    Co-evaluation    PT/OT/SLP Co-Evaluation/Treatment: Yes Reason for Co-Treatment: For patient/therapist safety;To address functional/ADL transfers   OT goals addressed during session: ADL's and self-care      AM-PAC OT "6 Clicks" Daily Activity     Outcome Measure   Help from another person eating meals?: Total Help from another person taking care of personal grooming?: Total Help from another person toileting, which includes using toliet, bedpan, or urinal?: Total Help from another person  bathing (including washing, rinsing, drying)?: Total Help from another person to put on and taking off regular upper body clothing?: Total Help from another person to put on and taking off regular lower body clothing?: Total 6 Click Score: 6    End of Session    OT Visit Diagnosis: Unsteadiness on feet (R26.81);Other abnormalities of gait and mobility (R26.89);Muscle weakness (generalized) (M62.81);Pain;Low vision, both eyes (H54.2)   Activity Tolerance Patient tolerated treatment well   Patient Left in bed;with call bell/phone within reach;with family/visitor present   Nurse Communication Mobility status        Time: 1610-9604 OT Time Calculation (min): 28 min  Charges: OT General Charges $OT Visit: 1 Visit OT Treatments $Self Care/Home Management : 8-22 mins  Parrie Rasco MSOT, OTR/L Acute Rehab Pager: [phone] Office: [phone]   Theodoro Grist Georgine Wiltse 08/04/2019, 12:48 PM

## 2019-08-04 NOTE — Progress Notes (Signed)
STROKE TEAM PROGRESS NOTE   INTERVAL HISTORY Patient remains on trach collar   and continues to have significant secretions requiring frequent suctioning.  She had PEG tube inserted  by IR y`day. her tongue continues to have significant swelling.  Blood pressure now controlled .  One of her daughters apparently called the hospice nurse requesting social worker be called in order to arrange transfer to hospice however the daughter I spoke to in the hospital clearly wanted PEG tube and rehab.  We will get palliative care team to meet with the family and discuss and finalize goals of care  Vitals:   08/04/19 1200 08/04/19 1300 08/04/19 1318 08/04/19 1400  BP: (!) 93/58 (!) 80/50 108/68 100/63  Pulse: 80 74 78 76  Resp: (!) 22 (!) 22 (!) 23 (!) 21  Temp: 99.2 F (37.3 C)     TempSrc: Axillary     SpO2: 95% 94% 97% 95%  Weight:      Height:       CBC:  Recent Labs  Lab 08/03/19 0423 08/04/19 0500  WBC 10.9* 11.5*  HGB 9.2* 9.7*  HCT 29.1* 30.4*  MCV 89.0 89.4  PLT 270 782   Basic Metabolic Panel:  Recent Labs  Lab 08/03/19 0423 08/04/19 0500  NA 133* 135  K 3.3* 3.7  CL 94* 96*  CO2 29 28  GLUCOSE 143* 133*  BUN 24* 20  CREATININE 0.70 0.66  CALCIUM 10.5* 10.7*  MG 2.1 2.1   IMAGING past 24 hours IR GASTROSTOMY TUBE MOD SED  Result Date: 08/03/2019 INDICATION: Dysphagia, intracranial hemorrhage, acute respiratory failure EXAM: FLUOROSCOPIC 24 FRENCH PULL-THROUGH GASTROSTOMY Date:  08/03/2019 08/03/2019 3:21 pm Radiologist:  Jerilynn Mages. Daryll Brod, MD Guidance:  Fluoroscopic MEDICATIONS: Ancef 2 g within 1 hour of the procedure; Antibiotics were administered within 1 hour of the procedure. Glucagon 0.5 mg IV ANESTHESIA/SEDATION: Versed 1.0 mg IV; Fentanyl 50 mcg IV Moderate Sedation Time:  12 minutes The patient was continuously monitored during the procedure by the interventional radiology nurse under my direct supervision. CONTRAST:  8mL OMNIPAQUE IOHEXOL 300 MG/ML SOLN -  administered into the gastric lumen. FLUOROSCOPY TIME:  Fluoroscopy Time: 2 minutes 42 seconds (10 mGy). COMPLICATIONS: None immediate. PROCEDURE: Informed consent was obtained from the patient's family following explanation of the procedure, risks, benefits and alternatives. The patient understands, agrees and consents for the procedure. All questions were addressed. A time out was performed. Maximal barrier sterile technique utilized including caps, mask, sterile gowns, sterile gloves, large sterile drape, hand hygiene, and betadine prep. The left upper quadrant was sterilely prepped and draped. An oral gastric catheter was inserted into the stomach under fluoroscopy. The existing nasogastric feeding tube was removed. Air was injected into the stomach for insufflation and visualization under fluoroscopy. The air distended stomach was confirmed beneath the anterior abdominal wall in the frontal and lateral projections. Under sterile conditions and local anesthesia, a 79 gauge trocar needle was utilized to access the stomach percutaneously beneath the left subcostal margin. Needle position was confirmed within the stomach under biplane fluoroscopy. Contrast injection confirmed position also. A single T tack was deployed for gastropexy. Over an Amplatz guide wire, a 9-French sheath was inserted into the stomach. A snare device was utilized to capture the oral gastric catheter. The snare device was pulled retrograde from the stomach up the esophagus and out the oropharynx. The 24 pull-through gastrostomy was connected to the snare device and pulled antegrade through the oropharynx down the esophagus into the  stomach and then through the percutaneous tract external to the patient. The gastrostomy was assembled externally. Contrast injection confirms position in the stomach. Images were obtained for documentation. The patient tolerated procedure well. No immediate complication. IMPRESSION: Fluoroscopic insertion of a 24  "pull-through" gastrostomy. Electronically Signed   By: Judie Petit.  Shick M.D.   On: 08/03/2019 15:33     PHYSICAL EXAM    Obese middle-aged lady who is s/p tracheostomy and on trach collar .she has severe enlargement of the tongue. . Afebrile. Head is nontraumatic. Neck is supple without bruit.    Cardiac exam no murmur or gallop. Lungs are clear to auscultation. Distal pulses are well felt. Neurological Exam : Patient is drowsy.  She opens eyes partially to sternal rub but does not follow any commands and remains globally aphasic.  There is tonic downward eye deviation.  Pupils 3 mm sluggishly reactive.  Doll's eye movements are sluggish.  Corneal flexes are present.  There is some spontaneous respiratory effort.  Cough and gag are present.  There is no spontaneous extremity movements is trace withdrawal in lower extremities to pain.  There is some intermittent myoclonic tremor in the right upper extremity.   ASSESSMENT/PLAN [name] is a 59 y.o. female with history of ICH 7 yrs ago, HTN found unresponsive. Found to have L sided hemiplegia, R gaze, SBP 250.  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 Moderate IVH. Mass effect w/ partial effacment R lateral and 3rd ventricles. 46mm L midline shift. Suspicious for early hydrocephalus.   Repeat CT head 3/16 interval L frontal lobe EVD w/ tip at R thalamus. Interval increased in R ICH now w/ 69mm L midline shift. IVH extended into B lateral, 3rd and 4th ventricles. Prominent B superior ophthalmic vein suggestion increased ICP.   CT repeat 3/17 - stable MLS 55mm, hematoma and hydrocephalus  CT repeat 3/20 - stable hematoma and edeam. Decreased hydrocephalus.   CT repeat 3/25 evolution R thalamic ICH w/ decreasing clot. Increased ventricular volume s/p EVD removal. 1cm midline shift. Sinusitis.  CT repeat 3/26  - Stable volume of right thalamic and intraventricular hemorrhage. Ventriculomegaly with 2 mm of increased lateral ventricular diameter when compared yesterday.  CT repeat 3/28 - Slight interval decrease in size of right thalamic hemorrhage, but with similar degree of surrounding vasogenic edema. Localized 9 mm right-to-left shift relatively unchanged. Intraventricular extension with small volume intraventricular blood, stable. Lateral ventriculomegaly not significantly changed.  2D Echo EF 60-65%.No arterial finding for bleed. Severe LVH. 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  EEG severe diffuse encephalopathy   LDL 63   HgbA1c 5.7  Heparin 5000 units sq tid for VTE prophylaxis   No antithrombotic prior to admission, now on No antithrombotic given hemorrhage   Therapy recommendations: SNF  Disposition:  pending - no insurance coverage for [facility]  Transfer to the floor   Transfer to [facility] as attending tomorrow. Stroke will continue to follow.   Dr. Pearlean Brownie updated daughter via telephone  Cytotoxic Cerebral Edema Obstructive hydrocephalus, resolved Induced Hypernatremia, resolved  EVD placed 3/15 [name])  CT 3/16 - hematoma mild enlargement  CT 3/17 - stable hematoma and MLS  CT 3/20 - stable hematoma and edema with decreased hydrocephalus  CT repeat 3/25 evolution R thalamic ICH w/ decreasing clot. Increased ventricular volume s/p EVD removal. 1cm midline shift.   CT repeat 3/26 -  Stable volume of right thalamic and intraventricular hemorrhage. Ventriculomegaly with 2 mm of increased lateral ventricular diameter when compared yesterday.  Off 3% now, on NS @ 25  Na 133  EVD removed 3/24  Acute Respiratory Failure d/t ICH Aspiration PNA Fever, resolved  Intubated in ED  T-max afebrile  WBCs 10.9  CCM on board  Sedation, off propofol, on fentanyl  Pneumonia - (ceftriaxone and metronidazole 3/16>>3/20)   Cefepime 07/23/19>>07/27/19 (EVD out)  CXR 3/24 Pulmonary vascular  congestion and mild interstitial edema.  CXR 3/26 - Left lower lobe atelectasis/infiltrate. Small left pleural effusion again noted.  UA neg; UCx 3/24 - no growth - final   Blood culture 07/27/19 no growth x 2 days  Respiratory culture - 3/26 - no growth 2 days  Blood Cultures - 3/26 - no growth   Urine cultures - 3/27 - no growth   CXR 07/30/19 - Unchanged small to moderate left pleural effusion with associated atelectasis/airspace disease. Obscuration of the right hemidiaphragm which may reflect a small pleural effusion and or atelectasis/airspace disease.  Levaquin IV 500 mg daily started 3/27>>3/31 HCAP  Tracheostomy placement - 3/27, sutures out 4/1  Off vent x 48  Hypertensive Emergency Tongue Swellling  SBP > 250 on arrival  Home meds:  Coreg, HCTZ and losartan . Back on Cleviprex overnight -> wean . On losartan 50 bid, Coreg 25 bid,  catapres 0.2->0.3 tid, hydralazine 100 q6h, lasix 40 q4h . Stop CCB (amlodipine) d/t tongue swelling. Not on ACE. Consider stopping ARB as well if does not resolve.  . CCM added standing oxycodone . SBP goal <180   . Long-term BP goal normotensive  Dysphagia . Secondary to stroke . NPO . Cortrak . On tube feeds @ 50 . On IVF @ 25 . For PEG this week  Other Stroke Risk Factors  Obesity, Body mass index is 30.01 kg/m., recommend weight loss, diet and exercise as appropriate   Hx stroke/TIA  ICH 7 yrs ago. No data available in EPIC  obstructive sleep apnea  Other Active Problems  Hypokalemia 3.3 - CCM managing   Mild anemia of critical illness - Hb - 9.2  RUE rhythmic movement-doubt focal seizures or focal status epilepticus as continuous video-EEG monitoring -showed no seizures during- arm twitching seen during EEG without associated EEG change, low suspicion for motor seizures   S/pEG tube by IR 08/03/19  Hospital day # 17  Continue trach care as per CCM.  Start using PEG tube for nutrition and medicines.  Recommend  palliative care consult to discuss with her 2 daughters who apparently have different opinions about her goals of care to come on a consensus about how to move forward.  Discussed with Dr. Vassie Loll critical care medicine This patient is critically ill and at significant risk of neurological worsening, death and care requires constant monitoring of vital signs, hemodynamics,respiratory and cardiac monitoring, extensive review of multiple databases, frequent neurological assessment, discussion with family, other specialists and medical decision making of high complexity.I have made any additions or clarifications directly to the above note.This critical care time does not reflect procedure time, or teaching time or supervisory time of PA/NP/Med Resident etc but could involve care discussion time.  I spent 30 minutes of neurocritical care time  in the care of  this patient.       Delia Heady, MD  To contact Stroke Continuity provider, please refer to [email or web address]. After hours, contact General Neurology

## 2019-08-05 LAB — GLUCOSE, CAPILLARY
Glucose-Capillary: 174 mg/dL — ABNORMAL HIGH (ref 70–99)
Glucose-Capillary: 143 mg/dL — ABNORMAL HIGH (ref 70–99)
Glucose-Capillary: 152 mg/dL — ABNORMAL HIGH (ref 70–99)
Glucose-Capillary: 148 mg/dL — ABNORMAL HIGH (ref 70–99)
Glucose-Capillary: 197 mg/dL — ABNORMAL HIGH (ref 70–99)

## 2019-08-05 LAB — MAGNESIUM: Magnesium: 2.1 mg/dL (ref 1.7–2.4)

## 2019-08-05 MED ORDER — CHLORTHALIDONE 25 MG PO TABS
12.5000 mg | ORAL_TABLET | Freq: Every day | ORAL | Status: DC
  Administered 2019-08-05 – 2019-08-15 (×11): 12.5 mg via ORAL
  Filled 2019-08-05 (×7): qty 1
  Filled 2019-08-05: qty 0.5
  Filled 2019-08-05 (×4): qty 1

## 2019-08-05 NOTE — Care Management (Addendum)
Received a call from daughter Jon Gills wanting to start SNF process. Discussed with her her sister [name] has not called NCM back, but has been discussing hospice with [facility]. Shortly after Takela called NCM back and now wants SNF. Explained to both daughters palliative consult ordered to discuss goals. Since then PT worked with patient , per note patient was not able to  participate and benefit from PT.   Will await goals of care meeting.   Ronny Flurry RN

## 2019-08-05 NOTE — Progress Notes (Signed)
STROKE TEAM PROGRESS NOTE   INTERVAL HISTORY Patient remains poorly responsive and opens eyes to sternal rub but does not follow commands.  She continues to have copious secretions in the tracheostomy requiring frequent suctioning but remains on trach collar.  She is tolerating PEG tube feedings.  Patient daughter Haley Hernandez is at the bedside and also spoke to her other daughter over the phone both of them now seem to be in agreement and wants skilled nursing facility and not hospice.  Vitals:   08/05/19 1000 08/05/19 1100 08/05/19 1142 08/05/19 1200  BP: (!) 164/96 117/74    Pulse: 91 74 77   Resp: 19 19 14    Temp:    99.3 F (37.4 C)  TempSrc:    Axillary  SpO2: 96% 93% 97%   Weight:      Height:       CBC:  Recent Labs  Lab 08/03/19 0423 08/04/19 0500  WBC 10.9* 11.5*  HGB 9.2* 9.7*  HCT 29.1* 30.4*  MCV 89.0 89.4  PLT 270 619   Basic Metabolic Panel:  Recent Labs  Lab 08/03/19 0423 08/03/19 0423 08/04/19 0500 08/05/19 0500  NA 133*  --  135  --   K 3.3*  --  3.7  --   CL 94*  --  96*  --   CO2 29  --  28  --   GLUCOSE 143*  --  133*  --   BUN 24*  --  20  --   CREATININE 0.70  --  0.66  --   CALCIUM 10.5*  --  10.7*  --   MG 2.1   < > 2.1 2.1   < > = values in this interval not displayed.   IMAGING past 24 hours No results found.   PHYSICAL EXAM     Obese middle-aged lady who is s/p tracheostomy and on trach collar .she has severe enlargement of the tongue. . Afebrile. Head is nontraumatic. Neck is supple without bruit.    Cardiac exam no murmur or gallop. Lungs are clear to auscultation. Distal pulses are well felt. Neurological Exam : Patient is drowsy.  She opens eyes partially to sternal rub but does not follow any commands and remains globally aphasic.  There is tonic downward eye deviation.  Pupils 3 mm sluggishly reactive.  Doll's eye movements are sluggish.  Corneal flexes are present.  There is some spontaneous respiratory effort.  Cough and gag are  present.  There is no spontaneous extremity movements is trace withdrawal in lower extremities to pain.  There is some intermittent myoclonic tremor in the right upper extremity.   ASSESSMENT/PLAN [name] is a 59 y.o. female with history of ICH 7 yrs ago, HTN found unresponsive. Found to have L sided hemiplegia, R gaze, SBP 250.  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 Moderate IVH. Mass effect w/ partial effacment R lateral and 3rd ventricles. 84mm L midline shift. Suspicious for early hydrocephalus.   Repeat CT head 3/16 interval L frontal lobe EVD w/ tip at R thalamus. Interval increased in R ICH now w/ 85mm L midline shift. IVH extended into B lateral, 3rd and 4th ventricles. Prominent B superior ophthalmic vein suggestion increased ICP.   CT repeat 3/17 - stable MLS 42mm, hematoma and hydrocephalus  CT repeat 3/20 - stable hematoma and edeam. Decreased  hydrocephalus.   CT repeat 3/25 evolution R thalamic ICH w/ decreasing clot. Increased ventricular volume s/p EVD removal. 1cm midline shift. Sinusitis.  CT repeat 3/26 - Stable volume of right thalamic and intraventricular hemorrhage. Ventriculomegaly with 2 mm of increased lateral ventricular diameter when compared yesterday.  CT repeat 3/28 - Slight interval decrease in size of right thalamic hemorrhage, but with similar degree of surrounding vasogenic edema. Localized 9 mm right-to-left shift relatively unchanged. Intraventricular extension with small volume intraventricular blood, stable. Lateral ventriculomegaly not significantly changed.  2D Echo EF 60-65%.No arterial finding for bleed. Severe LVH. 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 Dr. Pearlean Brownie discussed with [facility] on 4/2. Should transition 4/3.   Palliative care request placed 4/1 - meeting 4/2 with Dr. Pearlean Brownie   Cytotoxic Cerebral Edema Obstructive hydrocephalus, resolved Induced Hypernatremia, resolved  EVD placed 3/15 Haley Hernandez)  CT 3/16 - hematoma mild enlargement  CT 3/17 - stable hematoma and MLS  CT 3/20 - stable hematoma and edema with decreased hydrocephalus  CT repeat 3/25 evolution R thalamic ICH w/ decreasing clot. Increased ventricular volume s/p EVD removal. 1cm midline shift.   CT repeat 3/26 - Stable volume of right thalamic and intraventricular hemorrhage. Ventriculomegaly with 2 mm of increased lateral ventricular diameter when compared yesterday.  Off 3% now, on NS @ 25  Na 133  EVD removed 3/24  Acute Respiratory Failure d/t ICH Aspiration PNA Fever, resolved  Intubated in ED  T-max afebrile  WBCs 10.9  CCM on board  Sedation, off propofol, on fentanyl  Pneumonia - (ceftriaxone and metronidazole 3/16>>3/20)   Cefepime 07/23/19>>07/27/19 (EVD out)  CXR 3/24 Pulmonary vascular congestion and mild interstitial edema.  CXR 3/26 - Left lower lobe atelectasis/infiltrate. Small left pleural effusion again noted.  UA neg; UCx 3/24 - no growth - final   Blood culture 07/27/19 no growth x 2 days  Respiratory culture - 3/26 - no growth 2 days  Blood Cultures - 3/26 - no growth   Urine cultures - 3/27 - no growth   CXR 07/30/19 - Unchanged small to moderate left pleural effusion with associated atelectasis/airspace disease. Obscuration of the right hemidiaphragm which may reflect a small pleural effusion and or atelectasis/airspace disease.  Levaquin IV 500 mg daily started 3/27>>3/31 HCAP  Tracheostomy placement - 3/27, sutures out 4/1  Off vent x 48.  Stable for xfer to floor 3/31   Hypertensive Emergency Tongue Swellling  SBP > 250 on arrival  Home meds:  Coreg, HCTZ and losartan . Back on Cleviprex overnight -> wean . CCM added standing oxycodone . Stop CCB (amlodipine) d/t tongue swelling. Not on ACE.  . Increasing size of tongue 4/2. Stopped ARB as well  . On Coreg 25 bid,  catapres 0.3 tid, hydralazine 100 q6h, labetalol prn . Added Chlorthalidone 12.5 . SBP goal <180   . Long-term BP goal normotensive  Dysphagia . Secondary to stroke . NPO . On tube feeds  . S/p PEG   Other Stroke Risk Factors  Obesity, Body mass index is 30.83 kg/m., recommend weight loss, diet and exercise as appropriate   Hx stroke/TIA  ICH 7 yrs ago. No  data available in EPIC  obstructive sleep apnea  Other Active Problems  Hypokalemia 3.7 - resolved  Mild anemia of critical illness - Hb - 9.7  RUE rhythmic movement-doubt focal seizures or focal status epilepticus as continuous video-EEG monitoring -showed no seizures during- arm twitching seen during EEG without associated EEG change, low suspicion for motor seizures   Hospital day # 18  Patient continues to not show significant neurological improvement and remains full support.  I had a long discussion the patient's 2 daughters at the bedside and now both of them are in agreement and did not want hospice but do want full support and transfer to skilled nursing facility and therapy and rehabilitation.  Palliative care meeting is pending.  Patient has low-grade fever and white count is showing rising trend and she has been off antibiotics for 2 days now.  Plan to consult medical hospitalist team to take over medical management after she leaves the ICU and transfer to their service.  Discussed with Dr. Jerral Ralph  This patient is critically ill and at significant risk of neurological worsening, death and care requires constant monitoring of vital signs, hemodynamics,respiratory and cardiac  monitoring, extensive review of multiple databases, frequent neurological assessment, discussion with family, other specialists and medical decision making of high complexity.I have made any additions or clarifications directly to the above note.This critical care time does not reflect procedure time, or teaching time or supervisory time of PA/NP/Med Resident etc but could involve care discussion time.  I spent 30 minutes of neurocritical care time  in the care of  this patient.       Delia Heady, MD  To contact Stroke Continuity provider, please refer to [email or web address]. After hours, contact General Neurology

## 2019-08-06 ENCOUNTER — Inpatient Hospital Stay (HOSPITAL_COMMUNITY): Payer: 59

## 2019-08-06 DIAGNOSIS — Z7189 Other specified counseling: Secondary | ICD-10-CM

## 2019-08-06 DIAGNOSIS — Z515 Encounter for palliative care: Secondary | ICD-10-CM

## 2019-08-06 DIAGNOSIS — Z789 Other specified health status: Secondary | ICD-10-CM

## 2019-08-06 LAB — CBC
HCT: 30 % — ABNORMAL LOW (ref 36.0–46.0)
Hemoglobin: 9.4 g/dL — ABNORMAL LOW (ref 12.0–15.0)
MCH: 28 pg (ref 26.0–34.0)
MCHC: 31.3 g/dL (ref 30.0–36.0)
MCV: 89.3 fL (ref 80.0–100.0)
Platelets: 338 10*3/uL (ref 150–400)
RBC: 3.36 MIL/uL — ABNORMAL LOW (ref 3.87–5.11)
RDW: 14.5 % (ref 11.5–15.5)
WBC: 11.3 10*3/uL — ABNORMAL HIGH (ref 4.0–10.5)
nRBC: 0 % (ref 0.0–0.2)

## 2019-08-06 LAB — MAGNESIUM: Magnesium: 1.9 mg/dL (ref 1.7–2.4)

## 2019-08-06 LAB — GLUCOSE, CAPILLARY
Glucose-Capillary: 158 mg/dL — ABNORMAL HIGH (ref 70–99)
Glucose-Capillary: 160 mg/dL — ABNORMAL HIGH (ref 70–99)
Glucose-Capillary: 172 mg/dL — ABNORMAL HIGH (ref 70–99)
Glucose-Capillary: 183 mg/dL — ABNORMAL HIGH (ref 70–99)
Glucose-Capillary: 189 mg/dL — ABNORMAL HIGH (ref 70–99)
Glucose-Capillary: 193 mg/dL — ABNORMAL HIGH (ref 70–99)

## 2019-08-06 LAB — URINALYSIS, ROUTINE W REFLEX MICROSCOPIC
Bilirubin Urine: NEGATIVE
Glucose, UA: NEGATIVE mg/dL
Hgb urine dipstick: NEGATIVE
Ketones, ur: NEGATIVE mg/dL
Nitrite: NEGATIVE
Protein, ur: NEGATIVE mg/dL
Specific Gravity, Urine: 1.02 (ref 1.005–1.030)
pH: 5 (ref 5.0–8.0)

## 2019-08-06 LAB — BASIC METABOLIC PANEL
Anion gap: 10 (ref 5–15)
BUN: 19 mg/dL (ref 6–20)
CO2: 30 mmol/L (ref 22–32)
Calcium: 11 mg/dL — ABNORMAL HIGH (ref 8.9–10.3)
Chloride: 95 mmol/L — ABNORMAL LOW (ref 98–111)
Creatinine, Ser: 0.72 mg/dL (ref 0.44–1.00)
GFR calc Af Amer: >60 mL/min (ref >60)
GFR calc non Af Amer: >60 mL/min (ref >60)
Glucose, Bld: 185 mg/dL — ABNORMAL HIGH (ref 70–99)
Potassium: 3.8 mmol/L (ref 3.5–5.1)
Sodium: 135 mmol/L (ref 135–145)

## 2019-08-06 MED ORDER — OXYCODONE HCL 5 MG PO TABS: 5.0000 mg | ORAL_TABLET | Freq: Two times a day (BID) | ORAL | Status: DC

## 2019-08-06 MED ORDER — GUAIFENESIN 100 MG/5ML PO SOLN
10.0000 mL | Freq: Two times a day (BID) | ORAL | Status: DC
  Administered 2019-08-06 – 2019-08-31 (×50): 200 mg
  Filled 2019-08-06 (×4): qty 10
  Filled 2019-08-06 (×2): qty 5
  Filled 2019-08-06 (×3): qty 10
  Filled 2019-08-06 (×2): qty 5
  Filled 2019-08-06 (×4): qty 10
  Filled 2019-08-06: qty 5
  Filled 2019-08-06: qty 10
  Filled 2019-08-06: qty 5
  Filled 2019-08-06 (×2): qty 10
  Filled 2019-08-06: qty 5
  Filled 2019-08-06 (×2): qty 10
  Filled 2019-08-06 (×2): qty 5
  Filled 2019-08-06: qty 10
  Filled 2019-08-06: qty 5
  Filled 2019-08-06 (×4): qty 10
  Filled 2019-08-06 (×2): qty 5
  Filled 2019-08-06 (×3): qty 10
  Filled 2019-08-06: qty 5
  Filled 2019-08-06 (×2): qty 10
  Filled 2019-08-06: qty 5
  Filled 2019-08-06 (×2): qty 10
  Filled 2019-08-06: qty 5
  Filled 2019-08-06: qty 10
  Filled 2019-08-06 (×2): qty 5
  Filled 2019-08-06 (×2): qty 10
  Filled 2019-08-06 (×2): qty 5
  Filled 2019-08-06: qty 10
  Filled 2019-08-06: qty 5

## 2019-08-06 MED ORDER — ACETAZOLAMIDE 250 MG PO TABS
500.0000 mg | ORAL_TABLET | Freq: Once | ORAL | Status: AC
  Administered 2019-08-06: 500 mg
  Filled 2019-08-06: qty 2

## 2019-08-06 MED ORDER — OXYCODONE HCL 5 MG PO TABS
5.0000 mg | ORAL_TABLET | Freq: Every day | ORAL | Status: DC
  Administered 2019-08-07: 5 mg
  Filled 2019-08-06: qty 1

## 2019-08-06 NOTE — Progress Notes (Signed)
MD notified of patient's MEWS score of 3. Awaiting response.Haley Hernandez

## 2019-08-06 NOTE — Progress Notes (Signed)
STROKE TEAM PROGRESS NOTE   INTERVAL HISTORY Patient RN at bedside. No family at bedside. Pt remains on trach collar, however, still has copious secretions and need frequent deep suctioning. Pt not open eyes on voice, but eventually opened eyes with pain stimulation. B/l eyes mild downward gaze, L>R. However, not following commands, not moving all extremities. Neuro worsened than one week ago, palliative care consult pending.   Vitals:   08/06/19 0418 08/06/19 0500 08/06/19 0754 08/06/19 0926  BP: 126/71  (!) 145/91   Pulse: 87  87 79  Resp: (!) 23  20 (!) 26  Temp: 98.9 F (37.2 C)  (!) 102.2 F (39 C)   TempSrc: Axillary  Axillary   SpO2: 95%  96% 95%  Weight:  74.5 kg    Height:       CBC:  Recent Labs  Lab 08/04/19 0500 08/06/19 0431  WBC 11.5* 11.3*  HGB 9.7* 9.4*  HCT 30.4* 30.0*  MCV 89.4 89.3  PLT 322 409   Basic Metabolic Panel:  Recent Labs  Lab 08/04/19 0500 08/04/19 0500 08/05/19 0500 08/06/19 0431  NA 135  --   --  135  K 3.7  --   --  3.8  CL 96*  --   --  95*  CO2 28  --   --  30  GLUCOSE 133*  --   --  185*  BUN 20  --   --  19  CREATININE 0.66  --   --  0.72  CALCIUM 10.7*  --   --  11.0*  MG 2.1   < > 2.1 1.9   < > = values in this interval not displayed.   IMAGING past 24 hours No results found.   PHYSICAL EXAM    Obese middle-aged lady who is s/p tracheostomy and on trach collar. She has severe enlargement/swelling of the tongue. Afebrile. Head is nontraumatic. Neck is supple without bruit.    Cardiac exam no murmur or gallop. Lungs are clear to auscultation. Distal pulses are well felt.  Neurological Exam : Patient eyes closed, not open with voice but eventually opened eyes with pain. Nonverbal and not following commands. With forced eye opening, b/l eyes mild downward gaze, L>R, doll's eyes present but no tracking. Pupils 3 mm sluggishly reactive, not blinking to visual threat. Corneal flexes are present. Cough and gag are present.  There  is no spontaneous extremity movements, is trace withdrawal in lower extremities to pain. Sensation, coordination and gait not tested.  ASSESSMENT/PLAN [name] is a 59 y.o. female with history of ICH 7 yrs ago, HTN found unresponsive. Found to have L sided hemiplegia, R gaze, SBP 250.  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 Moderate IVH. Mass effect w/ partial effacment R lateral and 3rd ventricles. 27mm L midline shift. Suspicious for early hydrocephalus.   Repeat CT head 3/16 interval L frontal lobe EVD w/ tip at R thalamus. Interval increased in R ICH now w/ 22mm L midline shift. IVH extended into B lateral, 3rd and 4th ventricles. Prominent B superior ophthalmic vein suggestion increased ICP.   CT repeat 3/17 - stable MLS 17mm, hematoma and hydrocephalus  CT repeat 3/20 - stable hematoma and edeam. Decreased hydrocephalus.   CT repeat 3/25 evolution R thalamic ICH w/ decreasing clot. Increased ventricular volume s/p EVD removal. 1cm  midline shift. Sinusitis.  CT repeat 3/26 - Stable volume of right thalamic and intraventricular hemorrhage. Ventriculomegaly with 2 mm of increased lateral ventricular diameter when compared yesterday.  CT repeat 3/28 - Slight interval decrease in size of right thalamic hemorrhage, but with similar degree of surrounding vasogenic edema. Localized 9 mm right-to-left shift relatively unchanged. Intraventricular extension with small volume intraventricular blood, stable. Lateral ventriculomegaly not significantly changed.  2D Echo EF 60-65%. Severe LVH. 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  EEG severe diffuse encephalopathy   LDL 63   HgbA1c 5.7  Heparin 5000 units sq tid for VTE prophylaxis   No antithrombotic prior to admission, now on No antithrombotic given hemorrhage   Therapy recommendations: SNF  Disposition:   pending - no insurance coverage for [facility]  Cytotoxic Cerebral Edema Obstructive hydrocephalus, resolved Induced Hypernatremia, resolved  EVD placed 3/15 [name])  CT 3/16 - hematoma mild enlargement  CT 3/17 - stable hematoma and MLS  CT 3/20 - stable hematoma and edema with decreased hydrocephalus  EVD removed 3/24  CT repeat 3/25 evolution R thalamic ICH w/ decreasing clot. Increased ventricular volume s/p EVD removal. 1cm midline shift.   CT repeat 3/26 - Stable volume of right thalamic and intraventricular hemorrhage. Ventriculomegaly with 2 mm of increased lateral ventricular diameter when compared yesterday.  CT repeat 3/28 - Intraventricular extension with small volume intraventricular blood, stable. Lateral ventriculomegaly not significantly changed.  Off 3% saline  Na 133->135  Acute Respiratory Failure d/t ICH possible PNA Fever  Intubated in ED  T-max 100.6->102.2  WBCs 10.9->11.3  Currently not on antibiotics  U/A neg 3/24  Pneumonia - (ceftriaxone and metronidazole 3/16>>3/20)   Cefepime 07/23/19>>07/27/19 (EVD out)  CXR 3/24 Pulmonary vascular congestion and mild interstitial edema.  CXR 3/26 - Left lower lobe atelectasis/infiltrate. Small left pleural effusion again noted.  UA neg; UCx 3/24 - no growth - final   Blood culture 07/27/19 no growth x 2 days  Respiratory culture - 3/26 - no growth 2 days  Blood Cultures - 3/26 - no growth   Urine cultures - 3/27 - no growth   Levaquin IV 500 mg daily started 3/27>>3/31 HCAP  Tracheostomy placement - 3/27, sutures out 4/1  Off vent x 48  Hypertensive Emergency Tongue Swellling  SBP > 250 on arrival  Home meds:  Coreg, HCTZ and losartan . Back on Cleviprex overnight -> wean . On Coreg 25 bid,  catapres 0.2->0.3 tid, hydralazine 100 q6h, lasix 40 q4h . Stop CCB (amlodipine) d/t tongue swelling. Not on ACE or ARB  . CCM added standing oxycodone . SBP goal <180   . Long-term BP goal  normotensive  Dysphagia . Secondary to stroke . NPO . PEG placed 08/03/19 . On tube feeds @ 60  Other Stroke Risk Factors  Obesity, Body mass index is 32.08 kg/m., recommend weight loss, diet and exercise as appropriate   Hx stroke/TIA  ICH 7 yrs ago. No data available in EPIC  obstructive sleep apnea  Other Active Problems  Hypokalemia 3.3->3.8 - CCM managing   Mild anemia of critical illness - Hb - 9.2->9.4  RUE rhythmic movement-doubt focal seizures or focal status epilepticus as continuous video-EEG monitoring -showed no seizures during- arm twitching seen during EEG without associated EEG change, low suspicion for motor seizures   Palliative Care consult - pending GOC discussion  Hospital day # 52   Marvel Plan, MD PhD Stroke Neurology 08/06/2019 1:03 PM   To contact Stroke Continuity provider, please refer to [email or web address]. After  hours, contact General Neurology

## 2019-08-06 NOTE — Consult Note (Signed)
Consultation Note Date: 08/06/2019   Patient Name: Haley Hernandez  DOB: 1960-05-16  MRN: [id number]  Age / Sex: 59 y.o., female  PCP: No primary care provider on file. Referring Physician: Florencia Reasons, MD  Reason for Consultation: Establishing goals of care  Pt has been here 17 days. 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 Have met w/ dtrs regularly (1 is a NP, 1 is  CNA). No insurance. Not a LTACH candidate. Prognosis not good. Stable for xfer to stepdown. Planning on SNF once she gets stable. 1 dtr called hospice directly yesterday. I had SW intervene and it appears dtrs are not communicating with each other now (they were earlier in hospital stay). We need help with goals of care. Plans are to transition her to [facility] once out of ICU. Thanks.  HPI/Patient Profile: 59 y.o. 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  Palliative care was consulted to establish goals of care.   Clinical Assessment and Goals of Care: I have reviewed medical records including EPIC notes, labs and imaging, received report from bedside RN, assessed the patient.    I met with Haley Hernandez this afternoon  to further discuss diagnosis prognosis, GOC, EOL wishes, disposition and options.   I introduced Palliative Medicine as specialized medical care for people living with serious illness. It focuses on providing relief from the symptoms and stress of a serious illness. The goal is to improve quality of life for both the patient and the family.  I asked Haley Hernandez to share with me more information about her mother. She shares that she is from [place] originally. 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 She is not married. She had three children though her son  is now deceased. She enjoyed traveling and shopping per discussion with patients daughters.   Prior to hospitalization Haley Hernandez was able to perform all bADLs. Her daughter, Haley Hernandez shares that she wore heels higher than hers. She is clearly a very proud woman.   I asked Haley Hernandez to share with me how their mother had been since hospitalization. She were ale to tell me that she is far weaker though up until three days ago she was able to follow basic directions through winking her eyes yes or no. She seems to have worsened in the last three days. Haley Hernandez share that this could have been in the setting of receiving oxycodone around the clock, they apparently metabolize this medication poorly and it has longer lasting effects on their system. I shared with them that [name] has been having intermittent fevers which are concerning for an infection though at the present time it is unclear where.   I told Haley Hernandez that I had spoke to neurology prior to meeting with them. There was concern based upon [name]'s decreased responsiveness. They seemed to both understand and agree with this. They  are interested to see if there is treatment for the infection if she has improvement.   I asked what the main goals are for Haley Hernandez. Both daughters said that they would ideally like for her to improve. I asked them if she were making no improvements what their plan would be. They both agree that if she were not improving and her outlook was poor they would opt to take her home.  For now the plan is to continue treatment to identify if any improvements can be made. I shared with Haley Hernandez that we will remain involved. I also told them that if she worsens it is our job to be honest with them about what is going one.  Regarding discharge plan, if well enough they hope that Haley Hernandez could go to an [facility] though not [facility] as they have described that this is a less than optimal facility.   Pertaining  to code status, at the present time there will be no changes and she will remain full code full scope of treatment. Though if no great improvements are made it would be valuable to talk more about a DNR.  Discussed with patient the importance of continued conversation with family and their  medical providers regarding overall plan of care and treatment options, ensuring decisions are within the context of the patients values and GOCs.  Decision Maker: Haley Hernandez (daughter) Haley Hernandez (daughter)  SUMMARY OF RECOMMENDATIONS   Full Code  Full Scope for now  Ongoing goals of care conversations  Chaplain Consult  Code Status/Advance Care Planning:  Full code   Palliative Prophylaxis:   Aspiration, Bowel Regimen, Delirium Protocol, Eye Care, Frequent Pain Assessment, Oral Care, Palliative Wound Care and Turn Reposition  Additional Recommendations (Limitations, Scope, Preferences):  Full Scope Treatment  Psycho-social/Spiritual:   Desire for further Chaplaincy support: Yes   Additional Recommendations: Caregiving  Support/Resources  Prognosis:   Unable to determine  Discharge Planning: Will likely need an LTACH placement    Primary Diagnoses: Present on Admission: . ICH (intracerebral hemorrhage) ([place]) . Acute respiratory failure with hypoxemia ([place]) . Fever  I have reviewed the medical record, interviewed the patient and family, and examined the patient. The following aspects are pertinent.  History reviewed. No pertinent past medical history. Social History   Socioeconomic History  . Marital status: Single    Spouse name: Not on file  . Number of children: Not on file  . Years of education: Not on file  . Highest education level: Not on file  Occupational History  . Not on file  Tobacco Use  . Smoking status: Never Smoker  . Smokeless tobacco: Never Used  Substance and Sexual Activity  . Alcohol use: Not on file  . Drug use: Not on file  .  Sexual activity: Not on file  Other Topics Concern  . Not on file  Social History Narrative  . Not on file   Social Determinants of Health   Financial Resource Strain:   . Difficulty of Paying Living Expenses:   Food Insecurity:   . Worried About Charity fundraiser in the Last Year:   . Arboriculturist in the Last Year:   Transportation Needs:   . Film/video editor (Medical):   Marland Kitchen Lack of Transportation (Non-Medical):   Physical Activity:   . Days of Exercise per Week:   . Minutes of Exercise per Session:   Stress:   . Feeling of Stress :   Social Connections:   .  Frequency of Communication with Friends and Family:   . Frequency of Social Gatherings with Friends and Family:   . Attends Religious Services:   . Active Member of Clubs or Organizations:   . Attends Archivist Meetings:   Marland Kitchen Marital Status:    History reviewed. No pertinent family history. Scheduled Meds: . carvedilol  25 mg Per Tube BID  . chlorhexidine gluconate (MEDLINE KIT)  15 mL Mouth Rinse BID  . Chlorhexidine Gluconate Cloth  6 each Topical Daily  . chlorthalidone  12.5 mg Oral Daily  . cloNIDine  0.3 mg Per Tube TID  . famotidine  20 mg Per Tube BID  . hydrALAZINE  100 mg Per Tube Q6H  . insulin aspart  0-9 Units Subcutaneous Q4H  . mouth rinse  15 mL Mouth Rinse 10 times per day  . oxyCODONE  5 mg Per Tube TID  . sodium chloride flush  10-40 mL Intracatheter Q12H  . triamcinolone   Mouth/Throat TID   Continuous Infusions: . feeding supplement (JEVITY 1.2 CAL) 1,000 mL (08/05/19 0709)   PRN Meds:.acetaminophen **OR** acetaminophen (TYLENOL) oral liquid 160 mg/5 mL **OR** acetaminophen, albuterol, labetalol, loperamide HCl, sodium chloride flush Medications Prior to Admission:  Prior to Admission medications   Medication Sig Start Date End Date Taking? Authorizing Provider  albuterol (PROAIR HFA) 108 (90 Base) MCG/ACT inhaler Inhale 1 puff into the lungs every 6 (six) hours as  needed for wheezing or shortness of breath.   Yes [provider]  carvedilol (COREG) 12.5 MG tablet Take 12.5 mg by mouth 2 (two) times daily. 05/06/19  Yes [provider]  chlorthalidone (HYGROTON) 25 MG tablet Take 12.5 mg by mouth daily.   Yes [provider]  FEROSUL 325 (65 Fe) MG tablet Take 325 mg by mouth daily. 04/30/19  Yes [provider]  losartan (COZAAR) 50 MG tablet Take 50 mg by mouth daily.   Yes [provider]   Allergies  Allergen Reactions  . Penicillins     Unable to verify reactions at this time/ Tolerated ceftriaxone March 2021   Review of Systems  Unable to perform ROS: Acuity of condition   Physical Exam Vitals and nursing note reviewed.  Constitutional:      Appearance: She is ill-appearing and toxic-appearing.  HENT:     Head: Normocephalic.     Nose: Nose normal.     Mouth/Throat:     Mouth: Mucous membranes are dry.  Eyes:     Pupils: Pupils are equal, round, and reactive to light.  Cardiovascular:     Rate and Rhythm: Normal rate and regular rhythm.  Pulmonary:     Comments: On trach collar Abdominal:     General: There is distension.  Musculoskeletal:     Cervical back: Normal range of motion.     Comments: UTA  Skin:    General: Skin is dry.     Capillary Refill: Capillary refill takes less than 2 seconds.  Neurological:     Comments: Somnolent    Vital Signs: BP 108/63 (BP Location: Left Arm)   Pulse 75   Temp (!) 100.5 F (38.1 C) (Oral)   Resp (!) 22   Ht 5' (1.524 m)   Wt 74.5 kg   SpO2 93%   BMI 32.08 kg/m  Pain Scale: Faces   Pain Score: Asleep  SpO2: SpO2: 93 % O2 Device:SpO2: 93 % O2 Flow Rate: .O2 Flow Rate (L/min): 5 L/min  IO: Intake/output summary:  Intake/Output Summary (Last 24 hours) at 08/06/2019 1125 Last data filed at 08/05/2019 1800 Gross per 24 hour  Intake 60 ml  Output --  Net 60 ml   LBM: Last BM Date: 08/06/19 Baseline Weight: Weight: 75 kg Most  recent weight: Weight: 74.5 kg     Palliative Assessment/Data: 10%  Time In: 1200 Time Out:1310 Time Total: 70 Greater than 50%  of this time was spent counseling and coordinating care related to the above assessment and plan.  Signed by: Rosezella Rumpf, NP   Please contact Palliative Medicine Team phone at [phone] for questions and concerns.  For individual provider: See Shea Evans

## 2019-08-06 NOTE — Progress Notes (Signed)
   08/06/19 1859  Clinical Encounter Type  Visited With Patient and family together  Visit Type Initial;Spiritual support  Referral From Palliative care team  Spiritual Encounters  Spiritual Needs Prayer;Emotional  Stress Factors  Family Stress Factors Health changes   Chaplain responded to a consult from the Palliative Care Team. Chaplain met with patient's daughter, [name]. Chaplain had a pleasant conversation with her, learning about her mom's background, current medical status, etc. Faith seems really important to this family, with the patient's daughter playing gospel music in the background for the patient. Chaplain offered prayer and support. Chaplain introduced spiritual care services. Spiritual care services available as needed.   Jeri Lager, Chaplain

## 2019-08-06 NOTE — ACP (Advance Care Planning) (Signed)
    Palliative Medicine Inpatient Consult   ADVANCED CARE PLANNING NOTE        Meeting Participants  Fenix Ruppe (daughter) Laymond Purser (daughter)        Discussion:  I asked what the main goals are for [facility]. Both daughters said that they would ideally like for her to improve. I asked them if she were making no improvements what their plan would be. They both agree that if she were not improving and her outlook was poor they would opt to take her home.  For now the plan is to continue treatment to identify if any improvements can be made. I shared with [name] and Jon Gills that we will remain involved. I also told them that if she worsens it is our job to be honest with them about what is going one.  Regarding discharge plan, if well enough they hope that Erum could go to an [facility] though not [facility] as they have described that this is a less than optimal facility.   Pertaining to code status, at the present time there will be no changes and she will remain full code full scope of treatment. Though if no great improvements are made it would be valuable to talk more about a DNR.  Discussed with patient the importance of continued conversation with family and their  medical providers regarding overall plan of care and treatment options, ensuring decisions are within the context of the patients values and GOCs.      Recommendations and Plan  Full Code  Time In:  1200 Time Out:  1230 Time Total:  30 Greater than 50%  of this time was spent counseling and coordinating care related to the above assessment and plan.   Signed by: Ernie Avena, NP   Please contact Palliative Medicine Team phone at [phone] for questions and concerns.  For individual provider: See Loretha Stapler

## 2019-08-06 NOTE — Progress Notes (Signed)
PROGRESS NOTE  Haley Hernandez XMI:[id number] DOB: [birth year] DOA: 07/18/2019 PCP: No primary care provider on file.   Brief summary:  Haley Hernandez is a 59 y.o. female with past medical history of intracerebral hemorrhage 7 years ago, hypertension presents to the emergency department after family found her unresponsive.  Last known normal 7:45 PM, family found her unresponsive.  EMS was called and patient noted to be plegic on the left side and gaze deviation to the right.  Blood pressure was 825 systolic.  When patient arrived to [facility] ER, she was immediately taken for stat CT head which demonstrated a large right thalamic hemorrhage with intraventricular extension and early hydrocephalus.  She was emergently intubated for airway protection.  Labetalol and Cleviprex was initiated for blood pressure control, initial delay as patient needed to be intubated.  Neurosurgery was consulted for EVD placement.   Admitted to neurology ICU on 3/15 for ICH status post tracheostomy and PEG Neurology transfer to tried hospitalist on April 3  HPI/Recap of past 24 hours:  Fever 102.2 this morning,  Daughters at bedside  Assessment/Plan: Active Problems:   ICH (intracerebral hemorrhage) ([place])   Acute respiratory failure with hypoxemia ([facility])   Hypokalemia   Hypophosphatemia   Fever  Fever work-up (fever on 4/3 am) -resent UA, urine culture, blood culture, trach aspirate culture -She was treated for pneumonia from March 16 to March 20 is with Rocephin and metronidazole -She was treated with cefepime from March 20 is to March 24 when she had EVD -She was treated with Levaquin from March 27 to March 31 for HCAP -UCx 3/24 and on 3/27 - no growth  -Blood culture 07/27/19 and 3/26 no growth  -Respiratory culture - 3/26 - no growth -Family report patient has been having loose stools since started tube feeds, she has a rectal tube in   Decreased mentation -Daughter report patient was awake  2 days ago, will stop scheduled oxycodone, monitor mental status  Hemorrhagic stroke from hypertensive urgency emergency s/p external ventricular drainage (present on admission) She has history of [place] 7 years ago  Acute respiratory failure due to [place] Now status post tracheostomy on March 27/status post PEG placement on March 31  Anemia from acute illness -Hemoglobin on presentation was normal -Currently hemoglobin above 9 in last few days -Currently no overt sign of bleeding, stool is brown  Hypertension Presented with hypertension/ urgency emergency -BP currently well controlled on Coreg, clonidine, hydralazine , chlorthalidone  Tongue swelling, unclear etiology DC losartan   Body mass index is 32.08 kg/m.  DVT Prophylaxis:  Code Status: full  Family Communication: 2 daughters at bedside  Disposition Plan:    Patient came from:         home                                                                                                 Anticipated d/c place:  TBD  Barriers to d/c OR conditions which need to be met to effect a safe d/c:  Need LTAC level of care or skilled nursing facility can take care  of trach PEG patient, however patient has no insurance   Consultants:  Admitted to neurology service transfer to [facility] hospitalist on April to third  Neurosurgery  Critical care  IR  Palliative care  Procedures:  Tracheostomy  PEG placement by IR  PICC line  Antibiotics:  As above   Objective: BP (!) 145/91 (BP Location: Left Arm)   Pulse 87   Temp (!) 102.2 F (39 C) (Axillary)   Resp (!) 23   Ht 5' (1.524 m)   Wt 74.5 kg   SpO2 95%   BMI 32.08 kg/m   Intake/Output Summary (Last 24 hours) at 08/06/2019 0817 Last data filed at 08/05/2019 1800 Gross per 24 hour  Intake 70 ml  Output --  Net 70 ml   Filed Weights   08/04/19 0500 08/05/19 0500 08/06/19 0500  Weight: 69.7 kg 71.6 kg 74.5 kg    Exam: Patient is examined daily including  today on 08/06/2019, exams remain the same as of yesterday except that has changed    General: Minimally responsive, protruding tongue ,positive trach, positive PEG, positive PICC line in right arm, positive external urinary catheter, positive rectal tube  Cardiovascular: RRR  Respiratory: Diminished at bases  Abdomen: Soft/ND/NT, positive BS, positive PEG  Musculoskeletal: No Edema  Neuro: Minimally responsive  Data Reviewed: Basic Metabolic Panel: Recent Labs  Lab 08/01/19 0636 08/01/19 0636 08/02/19 0432 08/03/19 0423 08/04/19 0500 08/05/19 0500 08/06/19 0431  NA 140  --  136 133* 135  --  135  K 3.3*  --  3.5 3.3* 3.7  --  3.8  CL 109  --  99 94* 96*  --  95*  CO2 24  --  '27 29 28  ' --  30  GLUCOSE 165*  --  151* 143* 133*  --  185*  BUN 16  --  14 24* 20  --  19  CREATININE 0.35*  --  0.46 0.70 0.66  --  0.72  CALCIUM 9.1  --  10.4* 10.5* 10.7*  --  11.0*  MG 1.9   < > 2.0 2.1 2.1 2.1 1.9   < > = values in this interval not displayed.   Liver Function Tests: No results for input(s): AST, ALT, ALKPHOS, BILITOT, PROT, ALBUMIN in the last 168 hours. No results for input(s): LIPASE, AMYLASE in the last 168 hours. No results for input(s): AMMONIA in the last 168 hours. CBC: Recent Labs  Lab 08/01/19 0636 08/02/19 0432 08/03/19 0423 08/04/19 0500 08/06/19 0431  WBC 9.0 12.0* 10.9* 11.5* 11.3*  HGB 8.6* 9.4* 9.2* 9.7* 9.4*  HCT 28.1* 29.9* 29.1* 30.4* 30.0*  MCV 91.8 89.3 89.0 89.4 89.3  PLT 202 240 270 322 338   Cardiac Enzymes:   No results for input(s): CKTOTAL, CKMB, CKMBINDEX, TROPONINI in the last 168 hours. BNP (last 3 results) No results for input(s): BNP in the last 8760 hours.  ProBNP (last 3 results) No results for input(s): PROBNP in the last 8760 hours.  CBG: Recent Labs  Lab 08/05/19 1506 08/05/19 2015 08/06/19 0017 08/06/19 0447 08/06/19 0722  GLUCAP 148* 197* 160* 189* 172*    Recent Results (from the past 240 hour(s))  Urine  Culture     Status: None   Collection Time: 07/27/19  2:18 PM   Specimen: Urine, Random  Result Value Ref Range Status   Specimen Description URINE, RANDOM  Final   Special Requests NONE  Final   Culture   Final    NO  GROWTH Performed at [facility], [place] [address] [place], [place] [address]    Report Status 07/28/2019 FINAL  Final  Culture, blood (Routine X 2) w Reflex to ID Panel     Status: None   Collection Time: 07/27/19  2:55 PM   Specimen: BLOOD  Result Value Ref Range Status   Specimen Description BLOOD LEFT ANTECUBITAL  Final   Special Requests   Final    BOTTLES DRAWN AEROBIC AND ANAEROBIC Blood Culture adequate volume   Culture NO GROWTH 5 DAYS  Final   Report Status 08/01/2019 FINAL  Final  Culture, blood (Routine X 2) w Reflex to ID Panel     Status: None   Collection Time: 07/27/19  3:00 PM   Specimen: BLOOD LEFT HAND  Result Value Ref Range Status   Specimen Description BLOOD LEFT HAND  Final   Special Requests   Final    BOTTLES DRAWN AEROBIC AND ANAEROBIC Blood Culture adequate volume   Culture NO GROWTH 5 DAYS  Final   Report Status 08/01/2019 FINAL  Final  Culture, blood (routine x 2)     Status: None   Collection Time: 07/29/19  7:44 PM   Specimen: BLOOD  Result Value Ref Range Status   Specimen Description BLOOD LEFT ANTECUBITAL  Final   Special Requests   Final    BOTTLES DRAWN AEROBIC AND ANAEROBIC Blood Culture adequate volume   Culture   Final    NO GROWTH 5 DAYS Performed at [facility], [address], [place], [place] [address]    Report Status 08/03/2019 FINAL  Final  Culture, blood (routine x 2)     Status: None   Collection Time: 07/29/19  7:47 PM   Specimen: BLOOD LEFT HAND  Result Value Ref Range Status   Specimen Description BLOOD LEFT HAND  Final   Special Requests   Final    BOTTLES DRAWN AEROBIC AND ANAEROBIC Blood Culture adequate volume   Culture   Final    NO GROWTH 5 DAYS Performed at [facility],  [place] [address] [place], [place] [address]    Report Status 08/03/2019 FINAL  Final  Culture, respiratory (non-expectorated)     Status: None   Collection Time: 07/29/19 10:34 PM   Specimen: Tracheal Aspirate; Respiratory  Result Value Ref Range Status   Specimen Description TRACHEAL ASPIRATE  Final   Special Requests NONE  Final   Gram Stain   Final    ABUNDANT WBC PRESENT, PREDOMINANTLY PMN FEW SQUAMOUS EPITHELIAL CELLS PRESENT NO ORGANISMS SEEN    Culture   Final    NO GROWTH 2 DAYS Performed at [facility], [place] [address], [place], [place] [address]    Report Status 08/01/2019 FINAL  Final  Culture, Urine     Status: None   Collection Time: 07/30/19 12:29 AM   Specimen: Urine, Catheterized  Result Value Ref Range Status   Specimen Description URINE, CATHETERIZED  Final   Special Requests NONE  Final   Culture   Final    NO GROWTH Performed at [facility] [address] [place], [place] [address]    Report Status 08/01/2019 FINAL  Final     Studies: No results found.  Scheduled Meds: . carvedilol  25 mg Per Tube BID  . chlorhexidine gluconate (MEDLINE KIT)  15 mL Mouth Rinse BID  . Chlorhexidine Gluconate Cloth  6 each Topical Daily  . chlorthalidone  12.5 mg Oral  Daily  . cloNIDine  0.3 mg Per Tube TID  . famotidine  20 mg Per Tube BID  . hydrALAZINE  100 mg Per Tube Q6H  . insulin aspart  0-9 Units Subcutaneous Q4H  . mouth rinse  15 mL Mouth Rinse 10 times per day  . oxyCODONE  5 mg Per Tube TID  . sodium chloride flush  10-40 mL Intracatheter Q12H  . triamcinolone   Mouth/Throat TID    Continuous Infusions: . feeding supplement (JEVITY 1.2 CAL) 1,000 mL (08/05/19 0709)     Time spent: 36mns, case discussed with neurology I have personally reviewed and interpreted on  08/06/2019 daily labs, tele strips, imagings as discussed above under date review session and assessment and plans.  I reviewed all nursing notes, pharmacy notes, consultant  notes,  vitals, pertinent old records  I have discussed plan of care as described above with RN , patient and family on 08/06/2019   FFlorencia ReasonsMD, PhD, FACP  [facility]  Available via Epic secure chat 7am-7pm for nonurgent issues Please page for urgent issues, pager number available through [email or web address] .   08/06/2019, 8:17 AM  LOS: 19 days

## 2019-08-06 NOTE — Progress Notes (Signed)
Patient MEWS Score Red due to RR of 26. MD notified. Continue with previous orders. Will continue to monitor.  Melony Overly, RN

## 2019-08-07 LAB — MAGNESIUM: Magnesium: 1.9 mg/dL (ref 1.7–2.4)

## 2019-08-07 LAB — URINE CULTURE: Culture: <10000 — AB

## 2019-08-07 LAB — BASIC METABOLIC PANEL
Anion gap: 11 (ref 5–15)
BUN: 26 mg/dL — ABNORMAL HIGH (ref 6–20)
CO2: 29 mmol/L (ref 22–32)
Calcium: 11.4 mg/dL — ABNORMAL HIGH (ref 8.9–10.3)
Chloride: 93 mmol/L — ABNORMAL LOW (ref 98–111)
Creatinine, Ser: 0.92 mg/dL (ref 0.44–1.00)
GFR calc Af Amer: >60 mL/min (ref >60)
GFR calc non Af Amer: >60 mL/min (ref >60)
Glucose, Bld: 211 mg/dL — ABNORMAL HIGH (ref 70–99)
Potassium: 4 mmol/L (ref 3.5–5.1)
Sodium: 133 mmol/L — ABNORMAL LOW (ref 135–145)

## 2019-08-07 LAB — GLUCOSE, CAPILLARY
Glucose-Capillary: 180 mg/dL — ABNORMAL HIGH (ref 70–99)
Glucose-Capillary: 180 mg/dL — ABNORMAL HIGH (ref 70–99)
Glucose-Capillary: 184 mg/dL — ABNORMAL HIGH (ref 70–99)
Glucose-Capillary: 184 mg/dL — ABNORMAL HIGH (ref 70–99)
Glucose-Capillary: 185 mg/dL — ABNORMAL HIGH (ref 70–99)
Glucose-Capillary: 198 mg/dL — ABNORMAL HIGH (ref 70–99)

## 2019-08-07 LAB — CBC
HCT: 29.8 % — ABNORMAL LOW (ref 36.0–46.0)
Hemoglobin: 9.2 g/dL — ABNORMAL LOW (ref 12.0–15.0)
MCH: 28.1 pg (ref 26.0–34.0)
MCHC: 30.9 g/dL (ref 30.0–36.0)
MCV: 91.1 fL (ref 80.0–100.0)
Platelets: 294 10*3/uL (ref 150–400)
RBC: 3.27 MIL/uL — ABNORMAL LOW (ref 3.87–5.11)
RDW: 14.4 % (ref 11.5–15.5)
WBC: 13.5 10*3/uL — ABNORMAL HIGH (ref 4.0–10.5)
nRBC: 0 % (ref 0.0–0.2)

## 2019-08-07 LAB — LACTIC ACID, PLASMA: Lactic Acid, Venous: 1.3 mmol/L (ref 0.5–1.9)

## 2019-08-07 MED ORDER — HYDRALAZINE HCL 25 MG PO TABS
25.0000 mg | ORAL_TABLET | Freq: Four times a day (QID) | ORAL | Status: DC
  Administered 2019-08-07: 25 mg
  Filled 2019-08-07: qty 1

## 2019-08-07 MED ORDER — OXYCODONE HCL 5 MG PO TABS
5.0000 mg | ORAL_TABLET | Freq: Four times a day (QID) | ORAL | Status: DC | PRN
  Administered 2019-08-11: 5 mg
  Filled 2019-08-07 (×2): qty 1

## 2019-08-07 MED ORDER — SENNOSIDES-DOCUSATE SODIUM 8.6-50 MG PO TABS
1.0000 | ORAL_TABLET | Freq: Every day | ORAL | Status: AC
  Administered 2019-08-07 – 2019-08-08 (×2): 1 via ORAL
  Filled 2019-08-07 (×2): qty 1

## 2019-08-07 MED ORDER — CLONIDINE HCL 0.1 MG PO TABS
0.3000 mg | ORAL_TABLET | Freq: Two times a day (BID) | ORAL | Status: DC
  Filled 2019-08-07: qty 3

## 2019-08-07 MED ORDER — HYDRALAZINE HCL 50 MG PO TABS: 50.0000 mg | ORAL_TABLET | Freq: Four times a day (QID) | ORAL | Status: DC

## 2019-08-07 NOTE — Progress Notes (Addendum)
PROGRESS NOTE  Haley Hernandez ERD:[id number] DOB: 03/16/1961 DOA: 07/18/2019 PCP: No primary care provider on file.   Brief summary:  Haley Hernandez is a 59 y.o. female with past medical history of intracerebral hemorrhage 7 years ago, hypertension presents to the emergency department after family found her unresponsive.  Last known normal 7:45 PM, family found her unresponsive.  EMS was called and patient noted to be plegic on the left side and gaze deviation to the right.  Blood pressure was 563 systolic.  When patient arrived to [facility] ER, she was immediately taken for stat CT head which demonstrated a large right thalamic hemorrhage with intraventricular extension and early hydrocephalus.  She was emergently intubated for airway protection.  Labetalol and Cleviprex was initiated for blood pressure control, initial delay as patient needed to be intubated.  Neurosurgery was consulted for EVD placement.   Admitted to neurology ICU on 3/15 for ICH status post tracheostomy and PEG Neurology transfer to tried hospitalist on April 3  HPI/Recap of past 24 hours:  Fever 102.2x1 on 4/3 am She is not awake RN report airway secretions seems has improved   Assessment/Plan: Active Problems:   ICH (intracerebral hemorrhage) (HCC)   Acute respiratory failure with hypoxemia (HCC)   Hypokalemia   Hypophosphatemia   Fever   Palliative care by specialist   Goals of care, counseling/discussion   Full code status  Fever work-up (fever x1 on 4/3 am) - -She was treated for pneumonia from March 16 to March 20 is with Rocephin and metronidazole -She was treated with cefepime from March 20 is to March 24 when she had EVD -She was treated with Levaquin from March 27 to March 31 for HCAP -UCx 3/24 and on 3/27 - no growth , repeat UA/urine culture on April 3, result pending -Blood culture 07/27/19 , 3/26  And 4/3 no growth  -Respiratory culture - 3/26 - no growth, repeat trach aspirate sent on  April 3, result pending -Repeat chest x-ray on April 3 "minimal left basilar atelectasis is noted but improved from the prior study" -Family report patient has been having loose stools since started tube feeds, she has a rectal tube in   Decreased mentation -Daughter report patient was awake 2 days ago,  - stop scheduled oxycodone -Taper clonidine -Check liver function and ammonia level -monitor mental status  Hemorrhagic stroke from hypertensive urgency emergency s/p external ventricular drainage (present on admission) She has history of [place] 7 years ago -Neurology following, will follow recommendation  Acute respiratory failure due to [place] status post tracheostomy on March 27/status post PEG placement on March 31  Anemia from acute illness -Hemoglobin on presentation was normal -Currently hemoglobin above 9 in last few days -Currently no overt sign of bleeding, stool is brown  Hypertension Presented with hypertension/ urgency emergency -Coreg, Cozaar, chlorthalidone listed as home medication -Cozaar discontinued, she is currently on Coreg, chlorthalidone, hydralazine and clonidine -BP now trending low normal, will decrease clonidine and hydralazine, continue Coreg chlorthalidone at current dose -Continue to monitor blood pressure, and adjust blood pressure medication as needed  Tongue swelling, unclear etiology DC losartan  Addendum 5:20 PM: Case discussed with mandible trauma surgeon Dr. Claudia Desanctis who states nothing to do from trauma /mandible standpoint , recommend consult ENT  I discussed case with ENT Dr. Wilburn Cornelia who recommend tongue depressor to separate teeth and tongue if able and consult oral surgeon on Monday  Body mass index is 32.08 kg/m.  DVT Prophylaxis:  Code Status:  full  Family Communication: 2 daughters at bedside on 4/3  Disposition Plan:    Patient came from:         home                                                                                                  Anticipated d/c place:  TBD  Barriers to d/c OR conditions which need to be met to effect a safe d/c:  Need LTAC level of care or skilled nursing facility can take care of trach PEG patient, however patient has no insurance   Consultants:  Admitted to neurology service transfer to [employer] hospitalist on April to third  Neurosurgery  Critical care  IR  Palliative care  Procedures:  Tracheostomy  PEG placement by IR  PICC line  Antibiotics:  As above   Objective: BP (!) 100/59 (BP Location: Left Arm)   Pulse 82   Temp 98.6 F (37 C) (Oral)   Resp 20   Ht 5' (1.524 m)   Wt 74.5 kg   SpO2 96%   BMI 32.08 kg/m   Intake/Output Summary (Last 24 hours) at 08/07/2019 1125 Last data filed at 08/07/2019 0539 Gross per 24 hour  Intake --  Output 650 ml  Net -650 ml   Filed Weights   08/04/19 0500 08/05/19 0500 08/06/19 0500  Weight: 69.7 kg 71.6 kg 74.5 kg    Exam: Patient is examined daily including today on 08/07/2019, exams remain the same as of yesterday except that has changed    General: Minimally responsive, protruding tongue ,positive trach, positive PEG, positive PICC line in right arm, positive external urinary catheter, positive rectal tube  Cardiovascular: RRR  Respiratory: Diminished at bases  Abdomen: Soft/ND/NT, positive BS, positive PEG  Musculoskeletal: No Edema  Neuro: Minimally responsive  Data Reviewed: Basic Metabolic Panel: Recent Labs  Lab 08/02/19 0432 08/02/19 0432 08/03/19 0423 08/04/19 0500 08/05/19 0500 08/06/19 0431 08/07/19 0457  NA 136  --  133* 135  --  135 133*  K 3.5  --  3.3* 3.7  --  3.8 4.0  CL 99  --  94* 96*  --  95* 93*  CO2 27  --  29 28  --  30 29  GLUCOSE 151*  --  143* 133*  --  185* 211*  BUN 14  --  24* 20  --  19 26*  CREATININE 0.46  --  0.70 0.66  --  0.72 0.92  CALCIUM 10.4*  --  10.5* 10.7*  --  11.0* 11.4*  MG 2.0   < > 2.1 2.1 2.1 1.9 1.9   < > = values in this interval  not displayed.   Liver Function Tests: No results for input(s): AST, ALT, ALKPHOS, BILITOT, PROT, ALBUMIN in the last 168 hours. No results for input(s): LIPASE, AMYLASE in the last 168 hours. No results for input(s): AMMONIA in the last 168 hours. CBC: Recent Labs  Lab 08/02/19 0432 08/03/19 0423 08/04/19 0500 08/06/19 0431 08/07/19 0457  WBC 12.0* 10.9* 11.5* 11.3* 13.5*  HGB 9.4* 9.2* 9.7* 9.4*  9.2*  HCT 29.9* 29.1* 30.4* 30.0* 29.8*  MCV 89.3 89.0 89.4 89.3 91.1  PLT 240 270 322 338 294   Cardiac Enzymes:   No results for input(s): CKTOTAL, CKMB, CKMBINDEX, TROPONINI in the last 168 hours. BNP (last 3 results) No results for input(s): BNP in the last 8760 hours.  ProBNP (last 3 results) No results for input(s): PROBNP in the last 8760 hours.  CBG: Recent Labs  Lab 08/06/19 1626 08/06/19 2011 08/07/19 0009 08/07/19 0420 08/07/19 0730  GLUCAP 183* 193* 184* 184* 185*    Recent Results (from the past 240 hour(s))  Culture, blood (routine x 2)     Status: None   Collection Time: 07/29/19  7:44 PM   Specimen: BLOOD  Result Value Ref Range Status   Specimen Description BLOOD LEFT ANTECUBITAL  Final   Special Requests   Final    BOTTLES DRAWN AEROBIC AND ANAEROBIC Blood Culture adequate volume   Culture   Final    NO GROWTH 5 DAYS Performed at [facility], [place] [address] [place], [place] [address]    Report Status 08/03/2019 FINAL  Final  Culture, blood (routine x 2)     Status: None   Collection Time: 07/29/19  7:47 PM   Specimen: BLOOD LEFT HAND  Result Value Ref Range Status   Specimen Description BLOOD LEFT HAND  Final   Special Requests   Final    BOTTLES DRAWN AEROBIC AND ANAEROBIC Blood Culture adequate volume   Culture   Final    NO GROWTH 5 DAYS Performed at [facility], [place] [address] [place], [place] [address]    Report Status 08/03/2019 FINAL  Final  Culture, respiratory (non-expectorated)     Status: None   Collection Time:  07/29/19 10:34 PM   Specimen: Tracheal Aspirate; Respiratory  Result Value Ref Range Status   Specimen Description TRACHEAL ASPIRATE  Final   Special Requests NONE  Final   Gram Stain   Final    ABUNDANT WBC PRESENT, PREDOMINANTLY PMN FEW SQUAMOUS EPITHELIAL CELLS PRESENT NO ORGANISMS SEEN    Culture   Final    NO GROWTH 2 DAYS Performed at [facility], [place] [address] [place], [place] [address]    Report Status 08/01/2019 FINAL  Final  Culture, Urine     Status: None   Collection Time: 07/30/19 12:29 AM   Specimen: Urine, Catheterized  Result Value Ref Range Status   Specimen Description URINE, CATHETERIZED  Final   Special Requests NONE  Final   Culture   Final    NO GROWTH Performed at [place] [address], [place], [place] [address]    Report Status 08/01/2019 FINAL  Final  Culture, respiratory (non-expectorated)     Status: None (Preliminary result)   Collection Time: 08/06/19  8:18 AM   Specimen: Tracheal Aspirate; Respiratory  Result Value Ref Range Status   Specimen Description TRACHEAL ASPIRATE  Final   Special Requests NONE  Final   Gram Stain   Final    NO WBC SEEN FEW GRAM POSITIVE COCCI FEW GRAM VARIABLE ROD    Culture   Final    CULTURE REINCUBATED FOR BETTER GROWTH Performed at [facility], [place] [address] [place], [place] [address]    Report Status PENDING  Incomplete  Culture, blood (routine x 2)     Status: None (Preliminary result)   Collection Time: 08/06/19  8:25 AM   Specimen: BLOOD  LEFT HAND  Result Value Ref Range Status   Specimen Description BLOOD LEFT HAND  Final   Special Requests   Final    BOTTLES DRAWN AEROBIC ONLY Blood Culture adequate volume   Culture   Final    NO GROWTH < 24 HOURS Performed at [facility], [address], [place], [place] [address]    Report Status PENDING  Incomplete  Culture, blood (routine x 2)     Status: None (Preliminary result)   Collection Time: 08/06/19  8:36 AM    Specimen: BLOOD LEFT FOREARM  Result Value Ref Range Status   Specimen Description BLOOD LEFT FOREARM  Final   Special Requests   Final    BOTTLES DRAWN AEROBIC ONLY Blood Culture adequate volume   Culture   Final    NO GROWTH < 24 HOURS Performed at [facility], [place] [address], [place], [place] [address]    Report Status PENDING  Incomplete     Studies: DG CHEST PORT 1 VIEW  Result Date: 08/06/2019 CLINICAL DATA:  Fevers EXAM: PORTABLE CHEST 1 VIEW COMPARISON:  07/30/2019 FINDINGS: Cardiac shadow is enlarged but stable. Tracheostomy tube and right-sided PICC line are noted in satisfactory position. Feeding catheter has been removed in the interval. Minimal left basilar atelectasis is noted but improved from the prior study. No other focal abnormality is noted. IMPRESSION: Left basilar atelectasis improved from the prior study. Tubes and lines as described. Electronically Signed   By: Inez Catalina M.D.   On: 08/06/2019 19:44    Scheduled Meds: . carvedilol  25 mg Per Tube BID  . chlorhexidine gluconate (MEDLINE KIT)  15 mL Mouth Rinse BID  . Chlorhexidine Gluconate Cloth  6 each Topical Daily  . chlorthalidone  12.5 mg Oral Daily  . cloNIDine  0.3 mg Per Tube BID  . famotidine  20 mg Per Tube BID  . guaiFENesin  10 mL Per Tube BID  . hydrALAZINE  50 mg Per Tube Q6H  . insulin aspart  0-9 Units Subcutaneous Q4H  . mouth rinse  15 mL Mouth Rinse 10 times per day  . oxyCODONE  5 mg Per Tube Daily  . sodium chloride flush  10-40 mL Intracatheter Q12H  . triamcinolone   Mouth/Throat TID    Continuous Infusions: . feeding supplement (JEVITY 1.2 CAL) 1,000 mL (08/05/19 0709)     Time spent: 73mns, I have personally reviewed and interpreted on  08/07/2019 daily labs, tele strips, imagings as discussed above under date review session and assessment and plans.  I reviewed all nursing notes, pharmacy notes, consultant notes,  vitals, pertinent old records  I have discussed plan  of care as described above with RN , patient and family on 08/07/2019   FFlorencia ReasonsMD, PhD, FACP  [facility]  Available via Epic secure chat 7am-7pm for nonurgent issues Please page for urgent issues, pager number available through [email or web address] .   08/07/2019, 11:25 AM  LOS: 20 days

## 2019-08-07 NOTE — Progress Notes (Signed)
Patient MEWS RED due to respirations of 28. Patient reassessed. MD notified. No new orders. Will continue to monitor. Melony Overly, RN

## 2019-08-07 NOTE — Progress Notes (Addendum)
STROKE TEAM PROGRESS NOTE   INTERVAL HISTORY Patient RN and two daughters are at bedside. Pt still on trach collar, copious secretions, able to open eyes with repetitive stimulation but not following commands, no spontaneous movement in all extremities. Continue to have significant tongue swelling, the upper and lower teeth are biting into the tongue, attempted to use tongue depressor to separate teeth and tongue but not successful. Recommend consultation from either ENT or oral surgeon.  Vitals:   08/07/19 0712 08/07/19 0757 08/07/19 1001 08/07/19 1126  BP: (!) 147/96 (!) 149/95 (!) 100/59 111/69  Pulse: 99 100 82 84  Resp: (!) 28 (!) _0 Temp: 99.6 F (37.6 C) 100.2 F (37.9 C) 98.6 F (37 C) 99.5 F (37.5 C)  TempSrc: Axillary Oral Oral Oral  SpO2: 92% 97% 96% 96%  Weight:      Height:       CBC:  Recent Labs  Lab 08/06/19 0431 08/07/19 0457  WBC 11.3* 13.5*  HGB 9.4* 9.2*  HCT 30.0* 29.8*  MCV 89.3 91.1  PLT 338 102   Basic Metabolic Panel:  Recent Labs  Lab 08/06/19 0431 08/07/19 0457  NA 135 133*  K 3.8 4.0  CL 95* 93*  CO2 30 29  GLUCOSE 185* 211*  BUN 19 26*  CREATININE 0.72 0.92  CALCIUM 11.0* 11.4*  MG 1.9 1.9   IMAGING past 24 hours DG CHEST PORT 1 VIEW  Result Date: 08/06/2019 CLINICAL DATA:  Fevers EXAM: PORTABLE CHEST 1 VIEW COMPARISON:  07/30/2019 FINDINGS: Cardiac shadow is enlarged but stable. Tracheostomy tube and right-sided PICC line are noted in satisfactory position. Feeding catheter has been removed in the interval. Minimal left basilar atelectasis is noted but improved from the prior study. No other focal abnormality is noted. IMPRESSION: Left basilar atelectasis improved from the prior study. Tubes and lines as described. Electronically Signed   By: Inez Catalina M.D.   On: 08/06/2019 19:44     PHYSICAL EXAM    Obese middle-aged lady who is s/p tracheostomy and on trach collar. She has severe enlargement/swelling of the tongue.  Afebrile. Head is nontraumatic. Neck is supple without bruit.    Cardiac exam no murmur or gallop. Lungs are clear to auscultation. Distal pulses are well felt.  Neurological Exam : Patient eyes closed, able to open with repetitive stimulation. However, nonverbal and not following commands. With eye opening, b/l eyes mild downward gaze, L>R, doll's eyes present but no tracking. Pupils 3 mm sluggishly reactive, not blinking to visual threat. Corneal flexes are present. Cough and gag are present.  There is no spontaneous extremity movements, is trace withdrawal in lower extremities to pain. Sensation, coordination and gait not tested.  ASSESSMENT/PLAN Ms. Rhea Kaelin is a 59 y.o. female with history of ICH 7 yrs ago, HTN found unresponsive. Found to have L sided hemiplegia, R gaze, SBP 250.  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 Moderate IVH. Mass effect w/ partial effacment R lateral and 3rd ventricles. 73m L midline shift. Suspicious for early hydrocephalus.   Repeat CT head 3/16 interval L frontal lobe EVD w/ tip at R thalamus. Interval increased in R ICH now w/ 732mL midline shift. IVH extended into B lateral, 3rd and 4th ventricles. Prominent B superior ophthalmic vein suggestion increased ICP.   CT repeat 3/17 - stable MLS  18m, hematoma and hydrocephalus  CT repeat 3/20 - stable hematoma and edeam. Decreased hydrocephalus.   CT repeat 3/25 evolution R thalamic ICH w/ decreasing clot. Increased ventricular volume s/p EVD removal. 1cm midline shift. Sinusitis.  CT repeat 3/26 - Stable volume of right thalamic and intraventricular hemorrhage. Ventriculomegaly with 2 mm of increased lateral ventricular diameter when compared yesterday.  CT repeat 3/28 - Slight interval decrease in size of right thalamic hemorrhage, but with similar degree of  surrounding vasogenic edema. Localized 9 mm right-to-left shift relatively unchanged. Intraventricular extension with small volume intraventricular blood, stable. Lateral ventriculomegaly not significantly changed.  Given neuro decline from one week ago, will repeat CT and EEG in am  2D Echo EF 60-65%. Severe LVH. 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  EEG severe diffuse encephalopathy   LDL 63   HgbA1c 5.7  Heparin 5000 units sq tid for VTE prophylaxis   No antithrombotic prior to admission, now on No antithrombotic given hemorrhage   Therapy recommendations: SNF  Disposition:  pending - no insurance coverage for LTACH  Cytotoxic Cerebral Edema Obstructive hydrocephalus, resolved Induced Hypernatremia, resolved  EVD placed 3/15 (Ronnald Ramp  CT 3/16 - hematoma mild enlargement  CT 3/17 - stable hematoma and MLS  CT 3/20 - stable hematoma and edema with decreased hydrocephalus  EVD removed 3/24  CT repeat 3/25 evolution R thalamic ICH w/ decreasing clot. Increased ventricular volume s/p EVD removal. 1cm midline shift.   CT repeat 3/26 - Stable volume of right thalamic and intraventricular hemorrhage. Ventriculomegaly with 2 mm of increased lateral ventricular diameter when compared yesterday.  CT repeat 3/28 - Intraventricular extension with small volume intraventricular blood, stable. Lateral ventriculomegaly not significantly changed.  Off 3% saline  Na 133->135->133  Acute Respiratory Failure d/t ICH possible PNA Fever  Intubated in ED  T-max 100.6->102.2->100.2-> afebrile  WBCs 10.9->11.3->13.5  Currently not on antibiotics  CXR 08/06/19 - Left basilar atelectasis improved from the prior study.  U/A 08/06/19 - neg  Blood and Respiratory cultures - pending  Pneumonia - (ceftriaxone and metronidazole 3/16>>3/20)   Cefepime 07/23/19>>07/27/19 (EVD out)  CXR 3/24 Pulmonary vascular congestion and mild interstitial edema.  CXR 3/26 - Left lower lobe atelectasis/infiltrate.  Small left pleural effusion again noted.  UA neg; UCx 3/24 - no growth - final   Blood culture 07/27/19 no growth x 2 days  Respiratory culture - 3/26 - no growth 2 days  Blood Cultures - 3/26 - no growth   Urine cultures - 3/27 - no growth   Levaquin IV 500 mg daily started 3/27>>3/31 HCAP  Tracheostomy placement - 3/27, sutures out 4/1  On trach collar with copious secretions  Hypertensive Emergency  SBP > 250 on arrival  Home meds:  Coreg, HCTZ and losartan . Back on Cleviprex overnight -> wean . On Coreg 25 bid,  catapres 0.2->0.3 tid, hydralazine 100 q6h, lasix 40 q4h .  .Marland KitchenCCM added standing oxycodone . SBP goal <180   . Long-term BP goal normotensive  Tongue swelling  amlodipine discontinued  Avoid ACE or ARB   Teeth are biting into the tongue  Recommend consultation from either ENT or oral surgery  Dysphagia . Secondary to stroke . NPO . PEG placed 08/03/19 . On tube feeds @ 60  Other Stroke Risk Factors  Obesity, Body mass index is 32.08 kg/m., recommend weight loss, diet and exercise as appropriate   Hx stroke/TIA  ICH 7 yrs ago. No data available in EPIC  obstructive sleep apnea  Other Active  Problems  Hypokalemia 3.3->3.8 - CCM following pt intermittently.   Mild anemia of critical illness - Hb - 9.2->9.4->9.2  RUE rhythmic movement-doubt focal seizures or focal status epilepticus as continuous video-EEG monitoring -showed no seizures during- arm twitching seen during EEG without associated EEG change, low suspicion for motor seizures   Palliative Care met with family members 08/06/19. Pt to remain Full Code for now with full scope of treatment. May need LTACH placement.  Hospital day # 20   Rosalin Hawking, MD PhD Stroke Neurology 08/07/2019 4:56 PM    To contact Stroke Continuity provider, please refer to [email or web address] After hours, contact General Neurology

## 2019-08-08 ENCOUNTER — Inpatient Hospital Stay (HOSPITAL_COMMUNITY): Payer: 59

## 2019-08-08 DIAGNOSIS — R220 Localized swelling, mass and lump, head: Secondary | ICD-10-CM

## 2019-08-08 DIAGNOSIS — R739 Hyperglycemia, unspecified: Secondary | ICD-10-CM

## 2019-08-08 DIAGNOSIS — R4182 Altered mental status, unspecified: Secondary | ICD-10-CM

## 2019-08-08 DIAGNOSIS — T783XXD Angioneurotic edema, subsequent encounter: Secondary | ICD-10-CM

## 2019-08-08 DIAGNOSIS — R1310 Dysphagia, unspecified: Secondary | ICD-10-CM

## 2019-08-08 LAB — BASIC METABOLIC PANEL
Anion gap: 10 (ref 5–15)
BUN: 27 mg/dL — ABNORMAL HIGH (ref 6–20)
CO2: 31 mmol/L (ref 22–32)
Calcium: 11.6 mg/dL — ABNORMAL HIGH (ref 8.9–10.3)
Chloride: 95 mmol/L — ABNORMAL LOW (ref 98–111)
Creatinine, Ser: 0.71 mg/dL (ref 0.44–1.00)
GFR calc Af Amer: >60 mL/min (ref >60)
GFR calc non Af Amer: >60 mL/min (ref >60)
Glucose, Bld: 207 mg/dL — ABNORMAL HIGH (ref 70–99)
Potassium: 3.7 mmol/L (ref 3.5–5.1)
Sodium: 136 mmol/L (ref 135–145)

## 2019-08-08 LAB — GLUCOSE, CAPILLARY
Glucose-Capillary: 200 mg/dL — ABNORMAL HIGH (ref 70–99)
Glucose-Capillary: 197 mg/dL — ABNORMAL HIGH (ref 70–99)
Glucose-Capillary: 161 mg/dL — ABNORMAL HIGH (ref 70–99)
Glucose-Capillary: 187 mg/dL — ABNORMAL HIGH (ref 70–99)
Glucose-Capillary: 207 mg/dL — ABNORMAL HIGH (ref 70–99)
Glucose-Capillary: 227 mg/dL — ABNORMAL HIGH (ref 70–99)
Glucose-Capillary: 238 mg/dL — ABNORMAL HIGH (ref 70–99)

## 2019-08-08 LAB — CULTURE, RESPIRATORY W GRAM STAIN
Culture: NORMAL
Gram Stain: NONE SEEN

## 2019-08-08 LAB — CBC WITH DIFFERENTIAL/PLATELET
Abs Immature Granulocytes: 0.09 10*3/uL — ABNORMAL HIGH (ref 0.00–0.07)
Basophils Absolute: 0.1 10*3/uL (ref 0.0–0.1)
Basophils Relative: 1 %
Eosinophils Absolute: 0.6 10*3/uL — ABNORMAL HIGH (ref 0.0–0.5)
Eosinophils Relative: 4 %
HCT: 30.4 % — ABNORMAL LOW (ref 36.0–46.0)
Hemoglobin: 9.6 g/dL — ABNORMAL LOW (ref 12.0–15.0)
Immature Granulocytes: 1 %
Lymphocytes Relative: 10 %
Lymphs Abs: 1.5 10*3/uL (ref 0.7–4.0)
MCH: 28.5 pg (ref 26.0–34.0)
MCHC: 31.6 g/dL (ref 30.0–36.0)
MCV: 90.2 fL (ref 80.0–100.0)
Monocytes Absolute: 1.2 10*3/uL — ABNORMAL HIGH (ref 0.1–1.0)
Monocytes Relative: 8 %
Neutro Abs: 11.3 10*3/uL — ABNORMAL HIGH (ref 1.7–7.7)
Neutrophils Relative %: 76 %
Platelets: 356 10*3/uL (ref 150–400)
RBC: 3.37 MIL/uL — ABNORMAL LOW (ref 3.87–5.11)
RDW: 14.2 % (ref 11.5–15.5)
WBC: 14.8 10*3/uL — ABNORMAL HIGH (ref 4.0–10.5)
nRBC: 0 % (ref 0.0–0.2)

## 2019-08-08 LAB — HEPATIC FUNCTION PANEL
ALT: 107 U/L — ABNORMAL HIGH (ref 0–44)
AST: 47 U/L — ABNORMAL HIGH (ref 15–41)
Albumin: 2.7 g/dL — ABNORMAL LOW (ref 3.5–5.0)
Alkaline Phosphatase: 113 U/L (ref 38–126)
Bilirubin, Direct: 0.2 mg/dL (ref 0.0–0.2)
Indirect Bilirubin: 0.4 mg/dL (ref 0.3–0.9)
Total Bilirubin: 0.6 mg/dL (ref 0.3–1.2)
Total Protein: 7.5 g/dL (ref 6.5–8.1)

## 2019-08-08 LAB — AMMONIA: Ammonia: 41 umol/L — ABNORMAL HIGH (ref 9–35)

## 2019-08-08 LAB — LACTIC ACID, PLASMA: Lactic Acid, Venous: 1.3 mmol/L (ref 0.5–1.9)

## 2019-08-08 MED ORDER — METHYLPREDNISOLONE SODIUM SUCC 125 MG IJ SOLR
60.0000 mg | Freq: Four times a day (QID) | INTRAMUSCULAR | Status: DC
  Administered 2019-08-09 (×4): 60 mg via INTRAVENOUS
  Filled 2019-08-08 (×4): qty 2

## 2019-08-08 MED ORDER — INSULIN ASPART 100 UNIT/ML ~~LOC~~ SOLN
2.0000 [IU] | SUBCUTANEOUS | Status: DC
  Administered 2019-08-08 – 2019-08-09 (×6): 2 [IU] via SUBCUTANEOUS

## 2019-08-08 MED ORDER — METHYLPREDNISOLONE SODIUM SUCC 125 MG IJ SOLR
125.0000 mg | Freq: Once | INTRAMUSCULAR | Status: AC
  Administered 2019-08-08: 125 mg via INTRAVENOUS
  Filled 2019-08-08: qty 2

## 2019-08-08 NOTE — Consult Note (Signed)
Reason for Consult:tongue swelling Referring Physician: hospitalist  Haley Hernandez is an 59 y.o. female.  HPI: History of intracranial hemorrhage and now has neurologic issues requiring PEG tube and tracheotomy.  She has had tongue swelling recently.  It now is protruding out of her mouth and she is biting on the dorsal and ventral surface.  History reviewed. No pertinent past medical history.  Past Surgical History:  Procedure Laterality Date  . IR GASTROSTOMY TUBE MOD SED  08/03/2019    History reviewed. No pertinent family history.  Social History:  reports that she has never smoked. She has never used smokeless tobacco. No history on file for alcohol and drug.  Allergies:  Allergies  Allergen Reactions  . Penicillins     Unable to verify reactions at this time/ Tolerated ceftriaxone March 2021    Medications: I have reviewed the patient's current medications.  Results for orders placed or performed during the hospital encounter of 07/18/19 (from the past 48 hour(s))  Glucose, capillary     Status: Abnormal   Collection Time: 08/06/19  4:26 PM  Result Value Ref Range   Glucose-Capillary 183 (H) 70 - 99 mg/dL    Comment: Glucose reference range applies only to samples taken after fasting for at least 8 hours.   Comment 1 Notify RN    Comment 2 Document in Chart   Glucose, capillary     Status: Abnormal   Collection Time: 08/06/19  8:11 PM  Result Value Ref Range   Glucose-Capillary 193 (H) 70 - 99 mg/dL    Comment: Glucose reference range applies only to samples taken after fasting for at least 8 hours.  Glucose, capillary     Status: Abnormal   Collection Time: 08/07/19 12:09 AM  Result Value Ref Range   Glucose-Capillary 184 (H) 70 - 99 mg/dL    Comment: Glucose reference range applies only to samples taken after fasting for at least 8 hours.  Glucose, capillary     Status: Abnormal   Collection Time: 08/07/19  4:20 AM  Result Value Ref Range   Glucose-Capillary  184 (H) 70 - 99 mg/dL    Comment: Glucose reference range applies only to samples taken after fasting for at least 8 hours.  Lactic acid, plasma     Status: None   Collection Time: 08/07/19  4:56 AM  Result Value Ref Range   Lactic Acid, Venous 1.3 0.5 - 1.9 mmol/L    Comment: Performed at [facility] [address], [place], [place] [address]  Magnesium     Status: None   Collection Time: 08/07/19  4:57 AM  Result Value Ref Range   Magnesium 1.9 1.7 - 2.4 mg/dL    Comment: Performed at [facility] [address], [place], [place] [address]  CBC     Status: Abnormal   Collection Time: 08/07/19  4:57 AM  Result Value Ref Range   WBC 13.5 (H) 4.0 - 10.5 K/uL   RBC 3.27 (L) 3.87 - 5.11 MIL/uL   Hemoglobin 9.2 (L) 12.0 - 15.0 g/dL   HCT 29.8 (L) 36.0 - 46.0 %   MCV 91.1 80.0 - 100.0 fL   MCH 28.1 26.0 - 34.0 pg   MCHC 30.9 30.0 - 36.0 g/dL   RDW 14.4 11.5 - 15.5 %   Platelets 294 150 - 400 K/uL   nRBC 0.0 0.0 - 0.2 %    Comment: Performed at [facility], [place],  [place] [address]  Basic metabolic panel     Status: Abnormal   Collection Time: 08/07/19  4:57 AM  Result Value Ref Range   Sodium 133 (L) 135 - 145 mmol/L   Potassium 4.0 3.5 - 5.1 mmol/L   Chloride 93 (L) 98 - 111 mmol/L   CO2 29 22 - 32 mmol/L   Glucose, Bld 211 (H) 70 - 99 mg/dL    Comment: Glucose reference range applies only to samples taken after fasting for at least 8 hours.   BUN 26 (H) 6 - 20 mg/dL   Creatinine, Ser 4.40 0.44 - 1.00 mg/dL   Calcium 34.7 (H) 8.9 - 10.3 mg/dL   GFR calc non Af Amer >60 >60 mL/min   GFR calc Af Amer >60 >60 mL/min   Anion gap 11 5 - 15    Comment: Performed at [facility], [address] [place], [place] [address]  Glucose, capillary     Status: Abnormal   Collection Time: 08/07/19  7:30 AM  Result Value Ref Range   Glucose-Capillary 185 (H) 70 - 99 mg/dL    Comment: Glucose reference range applies only to samples taken after  fasting for at least 8 hours.   Comment 1 Notify RN    Comment 2 Document in Chart   Glucose, capillary     Status: Abnormal   Collection Time: 08/07/19 12:01 PM  Result Value Ref Range   Glucose-Capillary 198 (H) 70 - 99 mg/dL    Comment: Glucose reference range applies only to samples taken after fasting for at least 8 hours.   Comment 1 Notify RN    Comment 2 Document in Chart   Glucose, capillary     Status: Abnormal   Collection Time: 08/07/19  3:48 PM  Result Value Ref Range   Glucose-Capillary 180 (H) 70 - 99 mg/dL    Comment: Glucose reference range applies only to samples taken after fasting for at least 8 hours.   Comment 1 Notify RN    Comment 2 Document in Chart   Glucose, capillary     Status: Abnormal   Collection Time: 08/07/19  8:38 PM  Result Value Ref Range   Glucose-Capillary 180 (H) 70 - 99 mg/dL    Comment: Glucose reference range applies only to samples taken after fasting for at least 8 hours.  Glucose, capillary     Status: Abnormal   Collection Time: 08/07/19 11:50 PM  Result Value Ref Range   Glucose-Capillary 161 (H) 70 - 99 mg/dL    Comment: Glucose reference range applies only to samples taken after fasting for at least 8 hours.  Basic metabolic panel     Status: Abnormal   Collection Time: 08/08/19  3:38 AM  Result Value Ref Range   Sodium 136 135 - 145 mmol/L   Potassium 3.7 3.5 - 5.1 mmol/L   Chloride 95 (L) 98 - 111 mmol/L   CO2 31 22 - 32 mmol/L   Glucose, Bld 207 (H) 70 - 99 mg/dL    Comment: Glucose reference range applies only to samples taken after fasting for at least 8 hours.   BUN 27 (H) 6 - 20 mg/dL   Creatinine, Ser 6.38 0.44 - 1.00 mg/dL   Calcium 75.6 (H) 8.9 - 10.3 mg/dL   GFR calc non Af Amer >60 >60 mL/min   GFR calc Af Amer >60 >60 mL/min   Anion gap 10 5 - 15    Comment: Performed at [facility], [address] [place], [place] [address]  CBC with Differential/Platelet     Status: Abnormal   Collection Time:  08/08/19  3:38 AM  Result Value Ref Range   WBC 14.8 (H) 4.0 - 10.5 K/uL   RBC 3.37 (L) 3.87 - 5.11 MIL/uL   Hemoglobin 9.6 (L) 12.0 - 15.0 g/dL   HCT 99.2 (L) 42.6 - 83.4 %   MCV 90.2 80.0 - 100.0 fL   MCH 28.5 26.0 - 34.0 pg   MCHC 31.6 30.0 - 36.0 g/dL   RDW 19.6 22.2 - 97.9 %   Platelets 356 150 - 400 K/uL   nRBC 0.0 0.0 - 0.2 %   Neutrophils Relative % 76 %   Neutro Abs 11.3 (H) 1.7 - 7.7 K/uL   Lymphocytes Relative 10 %   Lymphs Abs 1.5 0.7 - 4.0 K/uL   Monocytes Relative 8 %   Monocytes Absolute 1.2 (H) 0.1 - 1.0 K/uL   Eosinophils Relative 4 %   Eosinophils Absolute 0.6 (H) 0.0 - 0.5 K/uL   Basophils Relative 1 %   Basophils Absolute 0.1 0.0 - 0.1 K/uL   Immature Granulocytes 1 %   Abs Immature Granulocytes 0.09 (H) 0.00 - 0.07 K/uL    Comment: Performed at [facility], [address], [place], [place] [address]  Lactic acid, plasma     Status: None   Collection Time: 08/08/19  3:38 AM  Result Value Ref Range   Lactic Acid, Venous 1.3 0.5 - 1.9 mmol/L    Comment: Performed at [facility], [address], [place], [place] [address]  Hepatic function panel     Status: Abnormal   Collection Time: 08/08/19  3:38 AM  Result Value Ref Range   Total Protein 7.5 6.5 - 8.1 g/dL   Albumin 2.7 (L) 3.5 - 5.0 g/dL   AST 47 (H) 15 - 41 U/L   ALT 107 (H) 0 - 44 U/L   Alkaline Phosphatase 113 38 - 126 U/L   Total Bilirubin 0.6 0.3 - 1.2 mg/dL   Bilirubin, Direct 0.2 0.0 - 0.2 mg/dL   Indirect Bilirubin 0.4 0.3 - 0.9 mg/dL    Comment: Performed at [facility], [address], [place], [place] [address]  Ammonia     Status: Abnormal   Collection Time: 08/08/19  3:38 AM  Result Value Ref Range   Ammonia 41 (H) 9 - 35 umol/L    Comment: Performed at [facility], [address], [place], [place] [address]  Glucose, capillary     Status: Abnormal   Collection Time: 08/08/19  4:01 AM  Result Value Ref Range   Glucose-Capillary 200 (H) 70 - 99 mg/dL     Comment: Glucose reference range applies only to samples taken after fasting for at least 8 hours.  Glucose, capillary     Status: Abnormal   Collection Time: 08/08/19  8:45 AM  Result Value Ref Range   Glucose-Capillary 197 (H) 70 - 99 mg/dL    Comment: Glucose reference range applies only to samples taken after fasting for at least 8 hours.  Glucose, capillary     Status: Abnormal   Collection Time: 08/08/19 12:44 PM  Result Value Ref Range   Glucose-Capillary 187 (H) 70 - 99 mg/dL    Comment: Glucose reference range applies only to samples taken after fasting for at least 8 hours.  Glucose, capillary     Status: Abnormal  Collection Time: 08/08/19  3:44 PM  Result Value Ref Range   Glucose-Capillary 207 (H) 70 - 99 mg/dL    Comment: Glucose reference range applies only to samples taken after fasting for at least 8 hours.    CT HEAD WO CONTRAST  Result Date: 08/08/2019 CLINICAL DATA:  Intracranial hemorrhage EXAM: CT HEAD WITHOUT CONTRAST TECHNIQUE: Contiguous axial images were obtained from the base of the skull through the vertex without intravenous contrast. COMPARISON:  CT head 07/31/2019 FINDINGS: Brain: Continued improvement right thalamic hemorrhage. Decrease high-density hemorrhage centrally and decreased surrounding edema. Mild midline shift to the left has improved. Mild ventricular enlargement is stable. There is layering blood in the occipital horns, slightly improved. Interval improvement in edema left frontal lobe at site of prior ventriculostomy. Small bone fragments in the left frontal lobe unchanged. Vascular: Negative for hyperdense vessel Skull: Left frontal burr hole.  No acute skeletal abnormality. Sinuses/Orbits: Mucosal edema in the paranasal sinuses with air-fluid levels in the sphenoid sinus bilaterally. Mastoid clear bilaterally. Other: None IMPRESSION: Continued improvement in right thalamic hemorrhage. Small amount of ventricular hemorrhage with improvement. Mild  ventricular enlargement unchanged. Mild midline shift to the left slightly improved. Electronically Signed   By: Marlan Palauharles  Clark M.D.   On: 08/08/2019 08:03   DG CHEST PORT 1 VIEW  Result Date: 08/06/2019 CLINICAL DATA:  Fevers EXAM: PORTABLE CHEST 1 VIEW COMPARISON:  07/30/2019 FINDINGS: Cardiac shadow is enlarged but stable. Tracheostomy tube and right-sided PICC line are noted in satisfactory position. Feeding catheter has been removed in the interval. Minimal left basilar atelectasis is noted but improved from the prior study. No other focal abnormality is noted. IMPRESSION: Left basilar atelectasis improved from the prior study. Tubes and lines as described. Electronically Signed   By: Alcide CleverMark  Lukens M.D.   On: 08/06/2019 19:44   EEG adult  Result Date: 08/08/2019 Charlsie QuestYadav, [name] O, MD     08/08/2019 10:54 AM Patient Name: Haley LohDoreen Hernandez MRN: [id number] Epilepsy Attending: Charlsie QuestPriyanka O Yadav Referring Physician/Provider: Dr. Marvel PlanJindong Xu Date: 08/08/2019 Duration: 25.20 minutes Patient history: 59 year old female with right thalamic and basal ganglia ICH, continues to be altered.  EEG to evaluate for seizures. Level of alertness: Awake AEDs during EEG study: None Technical aspects: This EEG study was done with scalp electrodes positioned according to the 10-20 International system of electrode placement. Electrical activity was acquired at a sampling rate of 500Hz  and reviewed with a high frequency filter of 70Hz  and a low frequency filter of 1Hz . EEG data were recorded continuously and digitally stored. Description: During awake state, no clear posterior dominant rhythm was seen.  EEG showed continuous generalized and maximal right frontotemporal region 3 to 7 Hz theta-delta slowing. Hyperventilation and photic stimulation were not performed. Abnormality - Continuous slow, generalized and maximal right frontotemporal region IMPRESSION: This study is suggestive of cortical dysfunction in right frontotemporal region  likely secondary to underlying hemorrhage as well as moderate diffuse encephalopathy, nonspecific to etiology. No seizures or epileptiform discharges were seen throughout the recording. [name] Annabelle Harman Yadav    Review of Systems Blood pressure (!) 141/83, pulse 93, temperature 99.3 F (37.4 C), temperature source Axillary, resp. rate 20, height 5' (1.524 m), weight 72 kg, SpO2 97 %. Physical Exam  HENT:  She is arousable but difficult to tell if she is cognizant of my presence.  She has no lesions of the nose.  Her tongue is protruding from the mouth with a large amount of a edema that does not look  erythematous or infectious.  I will see any lesions.  The teeth are biting both dorsally and ventrally worsening the swelling of the portion that is extending beyond the teeth.  She has a tracheotomy    Assessment/Plan: Tongue swelling-this is fairly significant and likely from a drug reaction.  There does not appear to be any infectious properties or lesions.  She is biting now on the swollen tongue that looks more of a angioedematous type reaction.  At this point the only treatment would be to put a bite block in 2 keep her from clenching on the tongue with the teeth further worsening the swelling.  The bite block needs to be inserted between the molars to keep the teeth off the tongue.  Steroids would also be treatment if medically indicated.  Suzanna Obey 08/08/2019, 4:10 PM

## 2019-08-08 NOTE — Evaluation (Signed)
[facility] Speaking [facility]ve - Evaluation Patient Details  Name: Haley Hernandez MRN: [id number] Date of Birth: 09-29-1960  Today's Date: 08/08/2019 Time:  -     Past Medical History: History reviewed. No pertinent past medical history. Past Surgical History:  Past Surgical History:  Procedure Laterality Date  . IR GASTROSTOMY TUBE MOD SED  08/03/2019   HPI:  59 y.o. female with past medical history of intracerebral hemorrhage 7 years ago, hypertension presents to the emergency department after family found her unresponsive with L hemipleiga and R gaze deviation. 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 Pt intubated and with EVD placed on 3/15. EVD removed 3/24. tracheostomy 3/27; EEG 3/29 moderate diffuse encephalopathy with no seizures (multiple events of RUE twitching occurred during EEG without EEG changes)   Assessment / Plan / Recommendation Clinical Impression   Pt encountered asleep in bed. Her tongue is extremely swollen and her upper and lower teeth are biting into her tongue d/t the edema. She also had gauze wrapped around the exterior portion of her tongue, all of which would make it difficult to produce intelligible articulation. RN reported completing oral care is very difficult, and she's noted thick tan secretions on oral swab. Pt was observed with significant congestion and tracheal secretions, requiring suctioning at trach hub t/o the session. Pt's cuff was deflated and tolerating deflation prior to arrival. PMV was donned 3 times for no more than 10 seconds at a time time. With each placement, pt was noted with immediate increased WOB and back pressure following doffing 2/3 times. She was not observed to attempt to phonate, likely d/t her current mentation. Recommend PMV placement only with SLP at this time. Will continue to check-in x1/week for potential PMV trials as appropriate.   SLP Visit Diagnosis: Aphonia (R49.1)     SLP Assessment  Patient needs continued Speech Lanaguage Pathology Services    Follow Up Recommendations  Skilled Nursing facility;LTACH;24 hour supervision/assistance    Frequency and Duration min 1 x/week  2 weeks    PMSV Trial PMSV was placed for: ~1 minute Able to redirect subglottic air through upper airway: No Able to Attain Phonation: No Voice Quality: Aphonic Able to Expectorate Secretions: Yes Level of Secretion Expectoration with PMSV: Tracheal SpO2 During Trial: 98 % Pulse During Trial: 82 Behavior: No attempt to communicate   Tracheostomy Tube       Vent Dependency  FiO2 (%): 28 %    Cuff Deflation Trial  Maudry Mayhew, Student SLP Office: [phone]           08/08/2019, 1:57 PM

## 2019-08-08 NOTE — Progress Notes (Signed)
This RN unable to place bite block at this time as the tongue is too enlarged to place anything between the molars and tongue safely without causing trauma to teeth/tongue. MD Swayze notified, will continue to administer IV steroids as ordered to decrease swelling and assess for improvements. Portion of tongue protruding from mouth is wrapped with saline moistened gauze to prevent drying out.

## 2019-08-08 NOTE — Progress Notes (Signed)
   Palliative Medicine Inpatient Follow Up Note   HPI: 59 y.o. female  with past medical history of HTN, sleep apnea, obesity admitted on 07/18/2019 s/p acute intraparenchymal hemorrhage in the right thalamus and basal ganglia with intraventricular extensionwith mild hydrocephalus.  Palliative care was consulted to establish goals of care.   Met with patients daughter on 4/3, at that time they wished to continue to full code full scope of care. ENT will be consulted per chart review for ongoing tongue swelling.  Today's Discussion (08/08/2019): Chart reviewed. Spoke to nursing staff, they endorsed that it was very difficult to arouse this morning. It does not appear from the perspective of nursing or the medical teams that she has been making improvements. Patient does have in up-trending leukocytosis. Per review of micro data it does not appear that there is a significant reason for ongoing lethargy/somnolence. It appears that ENT has been asked to evaluate Jadelynn for her ongoing swelling of the tongue, perhaps this is a nidus of infection.   Discussed the importance of continued conversation with family and their  medical providers regarding overall plan of care and treatment options, ensuring decisions are within the context of the patients values and GOCs.  Questions and concerns addressed   Vital Signs Vitals:   08/08/19 0328 08/08/19 0846  BP:    Pulse: 81 87  Resp: (!) 23 (!) 22  Temp:    SpO2: 95%     Intake/Output Summary (Last 24 hours) at 08/08/2019 0847 Last data filed at 08/08/2019 0700 Gross per 24 hour  Intake 804 ml  Output 1000 ml  Net -196 ml   Last Weight  Most recent update: 08/08/2019  7:04 AM   Weight  72 kg (158 lb 11.7 oz)          Gen:  Somnolent AA F  HEENT:Swollen tongue extending beyond lips dry mucous membranes CV: Regular rate and rhythm, no murmurs rubs or gallops PULM: On Tracheostomy collar, rhonchi throughout notable copious secretions ABD:  soft/nontender/g-tube in place EXT: Generalized edema  Neuro: Will respond only to deep sternal ribs  SUMMARY OF RECOMMENDATIONS   Full Code  Full Scope for now  Ongoing goals of care conversations especially in the setting of little clinical improvements  Chaplain Consult  Time Spent: 25 Greater than 50% of the time was spent in counseling and coordination of care ______________________________________________________________________________________ [place] Team Team Cell Phone: [phone] Please utilize secure chat with additional questions, if there is no response within 30 minutes please call the above phone number  Palliative Medicine Team providers are available by phone from 7am to 7pm daily and can be reached through the team cell phone.  Should this patient require assistance outside of these hours, please call the patient's attending physician.

## 2019-08-08 NOTE — Progress Notes (Signed)
STROKE TEAM PROGRESS NOTE   INTERVAL HISTORY No family at bedside. Pt still on trach collar, significant secretions, needing constant suctioning. Still has significant tongue swelling and teeth biting. Pt did open eyes and move eyes to side of voice but not following commands. ENT consulted for tongue swelling.    Vitals:   08/08/19 0846 08/08/19 1025 08/08/19 1122 08/08/19 1245  BP: (!) 131/92 134/84  134/78  Pulse: 85 85 93 83  Resp: '20  18 20  ' Temp: 98.4 F (36.9 C)   98.7 F (37.1 C)  TempSrc: Axillary   Axillary  SpO2: 98%   97%  Weight:      Height:       CBC:  Recent Labs  Lab 08/07/19 0457 08/08/19 0338  WBC 13.5* 14.8*  NEUTROABS  --  11.3*  HGB 9.2* 9.6*  HCT 29.8* 30.4*  MCV 91.1 90.2  PLT 294 009   Basic Metabolic Panel:  Recent Labs  Lab 08/06/19 0431 08/06/19 0431 08/07/19 0457 08/08/19 0338  NA 135   < > 133* 136  K 3.8   < > 4.0 3.7  CL 95*   < > 93* 95*  CO2 30   < > 29 31  GLUCOSE 185*   < > 211* 207*  BUN 19   < > 26* 27*  CREATININE 0.72   < > 0.92 0.71  CALCIUM 11.0*   < > 11.4* 11.6*  MG 1.9  --  1.9  --    < > = values in this interval not displayed.   IMAGING past 24 hours CT HEAD WO CONTRAST  Result Date: 08/08/2019 CLINICAL DATA:  Intracranial hemorrhage EXAM: CT HEAD WITHOUT CONTRAST TECHNIQUE: Contiguous axial images were obtained from the base of the skull through the vertex without intravenous contrast. COMPARISON:  CT head 07/31/2019 FINDINGS: Brain: Continued improvement right thalamic hemorrhage. Decrease high-density hemorrhage centrally and decreased surrounding edema. Mild midline shift to the left has improved. Mild ventricular enlargement is stable. There is layering blood in the occipital horns, slightly improved. Interval improvement in edema left frontal lobe at site of prior ventriculostomy. Small bone fragments in the left frontal lobe unchanged. Vascular: Negative for hyperdense vessel Skull: Left frontal burr hole.  No  acute skeletal abnormality. Sinuses/Orbits: Mucosal edema in the paranasal sinuses with air-fluid levels in the sphenoid sinus bilaterally. Mastoid clear bilaterally. Other: None IMPRESSION: Continued improvement in right thalamic hemorrhage. Small amount of ventricular hemorrhage with improvement. Mild ventricular enlargement unchanged. Mild midline shift to the left slightly improved. Electronically Signed   By: Franchot Gallo M.D.   On: 08/08/2019 08:03   EEG adult  Result Date: 08/08/2019 Lora Havens, MD     08/08/2019 10:54 AM Patient Name: Haley Hernandez MRN: [id number] Epilepsy Attending: Lora Havens Referring Physician/Provider: Dr. Rosalin Hawking Date: 08/08/2019 Duration: 25.20 minutes Patient history: 59 year old female with right thalamic and basal ganglia ICH, continues to be altered.  EEG to evaluate for seizures. Level of alertness: Awake AEDs during EEG study: None Technical aspects: This EEG study was done with scalp electrodes positioned according to the 10-20 International system of electrode placement. Electrical activity was acquired at a sampling rate of '500Hz'  and reviewed with a high frequency filter of '70Hz'  and a low frequency filter of '1Hz' . EEG data were recorded continuously and digitally stored. Description: During awake state, no clear posterior dominant rhythm was seen.  EEG showed continuous generalized and maximal right frontotemporal region 3 to 7 Hz theta-delta  slowing. Hyperventilation and photic stimulation were not performed. Abnormality - Continuous slow, generalized and maximal right frontotemporal region IMPRESSION: This study is suggestive of cortical dysfunction in right frontotemporal region likely secondary to underlying hemorrhage as well as moderate diffuse encephalopathy, nonspecific to etiology. No seizures or epileptiform discharges were seen throughout the recording. [place] Obese middle-aged lady who is s/p tracheostomy and on  trach collar. She has severe enlargement/swelling of the tongue. Afebrile. Head is nontraumatic. Cardiac exam no murmur or gallop. Lungs are clear to auscultation. Distal pulses are well felt.   Neurological Exam : Patient eyes closed, however able to open with voice and eyes move to the side of voice on the right. However, nonverbal and not following commands. With eye opening, left eye downward gaze, right eye mid position with slight downward gaze, doll's eyes present. Pupils 3 mm sluggishly reactive, not blinking to visual threat. Corneal flexes are present. Cough and gag are present. Significant tongue swelling protrusion outside of mouth. There is no spontaneous extremity movements, is trace withdrawal in lower extremities to pain. Sensation, coordination and gait not tested.  ASSESSMENT/PLAN [name] is a 59 y.o. female with history of ICH 7 yrs ago, HTN found unresponsive. Found to have L sided hemiplegia, R gaze, SBP 250.  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 Moderate IVH. Mass effect w/ partial effacment R lateral and 3rd ventricles. 9m L midline shift. Suspicious for early hydrocephalus.   Repeat CT head 3/16 interval L frontal lobe EVD w/ tip at R thalamus. Interval increased in R ICH now w/ 7959mL midline shift. IVH extended into B lateral, 3rd and 4th ventricles. Prominent B superior ophthalmic vein suggestion increased ICP.   CT repeat 3/17 - stable MLS [id number] and hydrocephalus  CT repeat 3/20 - stable hematoma and edeam. Decreased hydrocephalus.   CT repeat 3/25 evolution R thalamic ICH w/ decreasing clot. Increased ventricular volume s/p EVD removal. 1cm midline shift. Sinusitis.  CT repeat 3/26 - Stable volume of right thalamic and intraventricular hemorrhage. Ventriculomegaly with 2 mm of increased lateral ventricular  diameter when compared yesterday.  CT repeat 3/28 - Slight interval decrease in size of right thalamic hemorrhage, but with similar degree of surrounding vasogenic edema. Localized 9 mm right-to-left shift relatively unchanged. Intraventricular extension with small volume intraventricular blood, stable. Lateral ventriculomegaly not significantly changed.  CT repeat 4/5 improvement in R thalamic hemorrhage, small ICH w/ improvement. Stable mild ventricular enlargement. Slight improvement in L midline shift.   2D Echo EF 60-65%. Severe LVH. 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  EEG severe diffuse encephalopathy   EEG repeat 4/5 R frontotemporal slowing c/w ICH, no sz  LDL 63   HgbA1c 5.7  Heparin 5000 units sq tid for VTE prophylaxis   No antithrombotic prior to admission, now on No antithrombotic given hemorrhage   Therapy recommendations: SNF  Disposition:  pending [facility] family members 08/06/19 w/ f/u today. Pt to remain Full Code for now with full scope of treatment.   Cytotoxic Cerebral Edema Obstructive hydrocephalus, resolved Induced Hypernatremia, resolved  EVD placed 3/15 (JoRonnald RampCT 3/16 - hematoma mild enlargement  CT 3/17 - stable hematoma and MLS  CT 3/20 - stable hematoma and edema with decreased hydrocephalus  EVD removed 3/24  CT repeat 3/25 evolution R thalamic ICH w/ decreasing clot. Increased ventricular volume s/p EVD removal. 1cm midline shift.   CT repeat 3/26 - Stable volume of right thalamic and intraventricular hemorrhage. Ventriculomegaly with 2 mm of increased lateral ventricular diameter when compared yesterday.  CT repeat 3/28 - Intraventricular extension with small volume intraventricular blood, stable. Lateral ventriculomegaly not significantly changed.  CT repeat 4/5 improvement in R thalamic hemorrhage, small ICH w/ improvement. Stable mild ventricular enlargement. Slight improvement in L midline  shift.   Off 3% saline  Na normalized  Acute Respiratory Failure d/t ICH possible PNA Fever  Intubated in ED  T-max 100.6->102.2->100.2-> afebrile  WBCs 10.9->11.3->13.5->14.8  Currently not on antibiotics  CXR 08/06/19 - Left basilar atelectasis improved from the prior study.  U/A 08/06/19 - neg  Blood and Respiratory cultures - 4/3 no growth x 2 d; normal respiratory flora  Pneumonia - (ceftriaxone and metronidazole 3/16>>3/20)   Cefepime 07/23/19>>07/27/19 (EVD out)  CXR 3/24 Pulmonary vascular congestion and mild interstitial edema.  CXR 3/26 - Left lower lobe atelectasis/infiltrate. Small left pleural effusion again noted.  UA neg; UCx 3/24 - no growth - final   Blood culture 07/27/19 no growth x 2 days  Respiratory culture - 3/26 - no growth 2 days  Blood Cultures - 3/26 - no growth   Urine cultures - 3/27 - no growth   Levaquin IV 500 mg daily started 3/27>>3/31 HCAP  Tracheostomy placement - 3/27, sutures out 4/1  On trach collar with copious secretions  Hypertensive Emergency  SBP > 250 on arrival  Home meds:  Coreg, HCTZ and losartan . Off Cleviprex  . On Coreg 25 bid,  Chlorthalidone 12.5 . CCM added standing oxycodone for chronic pain as source of HTN, now off . SBP goal <180   . Long-term BP goal normotensive  Tongue swelling  Off losartan, amlodipine   Avoid ACE or ARB   Teeth are biting into the tongue  ENT consulted today - recommend bite block between molars, and steroid if indicated  Dysphagia . Secondary to stroke . NPO . PEG placed 08/03/19 . On tube feeds @ 60  Hyperglycemia   A1C 5.7  On TF  CBG monitoring  TF coverage novolog 2u q4h   SSI  Other Stroke Risk Factors  Obesity, Body mass index is 31 kg/m., recommend weight loss, diet and exercise as appropriate   Hx stroke/TIA  ICH 7 yrs ago. No data available in EPIC  obstructive sleep apnea  Other Active Problems  Hypokalemia - resolved  Mild anemia of  critical illness - Hb - 9.2->9.4->9.2->9.6  RUE rhythmic movement-doubt focal seizures or focal status epilepticus as continuous video-EEG monitoring -showed no seizures during- arm twitching seen during EEG without associated EEG change, low suspicion for motor seizures   Hospital day # 21   Case discussed with Dr. Benny Lennert and palliative care NP Sharyn Lull.  Rosalin Hawking, MD PhD Stroke Neurology 08/08/2019 3:23 PM    To contact Stroke Continuity provider, please refer to [email or web address] After hours, contact General Neurology

## 2019-08-08 NOTE — Progress Notes (Signed)
Inpatient Diabetes Program Recommendations  AACE/ADA: New Consensus Statement on Inpatient Glycemic Control   Target Ranges:  Prepandial:   less than 140 mg/dL      Peak postprandial:   less than 180 mg/dL (1-2 hours)      Critically ill patients:  140 - 180 mg/dL   Results for [name], Haley Hernandez (MRN [id number]) as of 08/08/2019 10:26  Ref. Range 08/07/2019 07:30 08/07/2019 12:01 08/07/2019 15:48 08/07/2019 20:38 08/08/2019 04:01 08/08/2019 08:45  Glucose-Capillary Latest Ref Range: 70 - 99 mg/dL 475 (H) 339 (H) 179 (H) 180 (H) 200 (H) 197 (H)   Review of Glycemic Control  Current orders for Inpatient glycemic control: Novolog 0-9 units Q4H  Inpatient Diabetes Program Recommendations:   Insulin-Tube Feeding Coverage: Please consider ordering Novolog 2 units Q4H for tube feeding coverage. If tube feeding is stopped or held then Novolog tube feeding coverage should also be stopped or held.   Thanks, Orlando Penner, RN, MSN, CDE Diabetes Coordinator Inpatient Diabetes Program [phone] (Team Pager from 8am to 5pm)

## 2019-08-08 NOTE — Progress Notes (Signed)
EEG complete - results pending 

## 2019-08-08 NOTE — Progress Notes (Signed)
PROGRESS NOTE  Kyia Rhude SUP:[id number] DOB: 04-14-1961 DOA: 07/18/2019 PCP: No primary care provider on file.  Brief History   Liliauna Santoni a 59 y.o.femalewith past medical history of intracerebral hemorrhage 7 years ago, hypertension presents to the emergency department after family found her unresponsive.  Last known normal 7:45 PM,family found her unresponsive. EMS was called and patient noted to be plegic on the left side and gaze deviation to the right. Blood pressure was 592 systolic.  When patient arrived to [facility] ER,she was immediately taken for stat CT head which demonstrated a large right thalamic hemorrhage with intraventricular extension and early hydrocephalus.She was emergently intubated for airway protection. Labetalol and Cleviprex was initiated for blood pressure control,initial delay as patient needed to beintubated.Neurosurgery was consulted for EVD placement.  Admitted to neurology ICU on 3/15 for ICH status post tracheostomy and PEG Neurology [facility] on April 3.  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 Both have been discontinued. ENT was consulted as tongue continued to protrude 4-5 cm past the teeth and 2 cm laterally this morning. Their recommendation is steroids and use of a "Bite Block" manufactured out of tongue depressor guaze and tape. I have communicated this to nursing.  Consultants  . ENT . Palliative Care . Neurology . Interventional Radiology . PCCM . Neurosurgery  Procedures  . EVD placement . Tracheostomy . Intubation and mechanical ventilation  Antibiotics   Anti-infectives (From admission, onward)   Start     Dose/Rate Route Frequency Ordered Stop   08/03/19 1442  ceFAZolin (ANCEF) 2-4 GM/100ML-% IVPB    Note to Pharmacy: [name]: cabinet override      08/03/19 1442 08/03/19 1458   07/30/19 0930  levofloxacin (LEVAQUIN) IVPB 500 mg      500 mg 100 mL/hr over 60 Minutes Intravenous Every 24 hours 07/30/19 0915 08/03/19 1101   07/23/19 0900  ceFEPIme (MAXIPIME) 2 g in sodium chloride 0.9 % 100 mL IVPB  Status:  Discontinued     2 g 200 mL/hr over 30 Minutes Intravenous Every 8 hours 07/23/19 0854 07/27/19 1503   07/19/19 1100  cefTRIAXone (ROCEPHIN) 2 g in sodium chloride 0.9 % 100 mL IVPB  Status:  Discontinued     2 g 200 mL/hr over 30 Minutes Intravenous Every 24 hours 07/19/19 1052 07/23/19 0842   07/19/19 1100  metroNIDAZOLE (FLAGYL) IVPB 500 mg     500 mg 100 mL/hr over 60 Minutes Intravenous Every 8 hours 07/19/19 1052 07/24/19 0301    .  Subjective  The patient is awake and responsive. Due to macroglossia, she is not able to speak.  Objective   Vitals:  Vitals:   08/08/19 1245 08/08/19 1600  BP: 134/78 (!) 141/83  Pulse: 83 93  Resp: 20 20  Temp: 98.7 F (37.1 C) 99.3 F (37.4 C)  SpO2: 97%    Exam:  Constitutional:  . The patient is awake, alert, and oriented x 3. Moderate distress from large protruding tongue. ENMT:  . Tongue very large. Protruding beyond lips by 4-5 cm and laterally by 1-2 cm.  Respiratory:  . No increased work of breathing. . No wheezes, rales, or rhonchi . No tactile fremitus Cardiovascular:  . Regular rate and rhythm . No murmurs, ectopy, or gallups. . No lateral PMI. No thrills. Abdomen:  . Abdomen is soft, non-tender, non-distended . No hernias, masses, or organomegaly . Normoactive bowel sounds.  Musculoskeletal:  . No cyanosis, clubbing,  or edema Skin:  . No rashes, lesions, ulcers . palpation of skin: no induration or nodules Neurologic:  . Patient unable to cooperate with exam. Psychiatric:  Patient unable to cooperate with exam.  I have personally reviewed the following:   Today's Data  . Vitals, BMP, CBC  Micro Data  . Urine culture (08/06/2019): Insignificant growth . Blood culture x 2 (08/06/2019): No growth  Scheduled Meds: . carvedilol  25 mg  Per Tube BID  . chlorhexidine gluconate (MEDLINE KIT)  15 mL Mouth Rinse BID  . Chlorhexidine Gluconate Cloth  6 each Topical Daily  . chlorthalidone  12.5 mg Oral Daily  . famotidine  20 mg Per Tube BID  . guaiFENesin  10 mL Per Tube BID  . insulin aspart  0-9 Units Subcutaneous Q4H  . insulin aspart  2 Units Subcutaneous Q4H  . mouth rinse  15 mL Mouth Rinse 10 times per day  . methylPREDNISolone (SOLU-MEDROL) injection  60 mg Intravenous Q6H  . senna-docusate  1 tablet Oral QHS  . sodium chloride flush  10-40 mL Intracatheter Q12H  . triamcinolone   Mouth/Throat TID   Continuous Infusions: . feeding supplement (JEVITY 1.2 CAL) 1,000 mL (08/08/19 0916)    Active Problems:   ICH (intracerebral hemorrhage) (HCC)   Acute respiratory failure with hypoxemia (HCC)   Hypokalemia   Hypophosphatemia   Fever   Palliative care by specialist   Goals of care, counseling/discussion   Full code status   Tongue swelling   LOS: 21 days   A & P  Fever work-up (fever x1 on 4/3 am): She was treated for pneumonia from March 16 to March 20 is with Rocephin and metronidazole. She was treated with cefepime from March 20 is to March 24 when she had EVD. She was treated with Levaquin from March 27 to March 31 for HCAP. Repeat UA/urine culture on April 3 demonstrating only insignificant growth. Blood cultures x 2 collected on that date as well as 3/24, 3/26 have had no growth. Repeat tracheal aspirates from 08/06/2019 have had no growth.  Gram stain has demonstrated only normal respiratory flora. 4/3 CXR demonstrated minimal left basilar atelectasis is noted but improved from the prior study. Family report patient has been having loose stools since started tube feeds, she has a rectal tube in  Angioedema: ENT consulted. I appreciate Dr. Janace Hoard' input. Bite block has been ordered and discussed with nursing to prevent the patient from biting her tongue. She has been started on steroids. Amlodipine and losartan  have been discontinued. Airway intact due to pre-existing tracheostomy.  Decreased mentation: Daughter report patient was awake 2 days ago. Per consultants patient with worsening mental status since yesterday. Palliative care is following. Sedating medications have been stopped.  Hemorrhagic stroke from hypertensive urgency emergency s/p external ventricular drainage (present on admission): She has history of ICH 7 years ago. Neurology following, will follow recommendation.  Acute respiratory failure due to ICH: Now status post tracheostomy on March 27/status post PEG placement on March 3.  Anemia from acute illness: Hemoglobin on presentation was normal. Currently hemoglobin 9.6. Monitor.  Hypertension: The patient presented with hypertension/ urgency emergency. Blood pressure is under fair control on Coreg, hydralazine, and chlorthalidone listed as home medication. Amlodipine and losartan stopped.  Obesity: Body mass index is 32.08 kg/m.  I have seen and examined this patient myself. I have spent 42 minutes in her evaluation and care.  DVT Prophylaxis: SCD's Code Status: full Family Communication: 2 daughters at  bedside on 4/3 Disposition Plan: The patient came from home. Anticipate discharge to SNF/LTAC. Awaiting PT/OT eval. Barriers to d/c OR conditions which need to be met to effect a safe d/c: Need LTAC level of care or skilled nursing facility can take care of trach PEG patient, however patient has no insurance  Alanea Woolridge, DO [facility] Direct contact: see [email or web address]  7PM-7AM contact night coverage as above 08/08/2019, 5:50 PM  LOS: 21 days

## 2019-08-08 NOTE — Procedures (Signed)
Patient Name: Kestrel Mis  MRN: [id number]  Epilepsy Attending: Charlsie Quest  Referring Physician/Provider: Dr. Marvel Plan Date: 08/08/2019 Duration: 25.20 minutes  Patient history: 59 year old female with right thalamic and basal ganglia ICH, continues to be altered.  EEG to evaluate for seizures.  Level of alertness: Awake  AEDs during EEG study: None  Technical aspects: This EEG study was done with scalp electrodes positioned according to the 10-20 International system of electrode placement. Electrical activity was acquired at a sampling rate of 500Hz  and reviewed with a high frequency filter of 70Hz  and a low frequency filter of 1Hz . EEG data were recorded continuously and digitally stored.   Description: During awake state, no clear posterior dominant rhythm was seen.  EEG showed continuous generalized and maximal right frontotemporal region 3 to 7 Hz theta-delta slowing. Hyperventilation and photic stimulation were not performed.  Abnormality - Continuous slow, generalized and maximal right frontotemporal region  IMPRESSION: This study is suggestive of cortical dysfunction in right frontotemporal region likely secondary to underlying hemorrhage as well as moderate diffuse encephalopathy, nonspecific to etiology. No seizures or epileptiform discharges were seen throughout the recording.  Dmarco Baldus 

## 2019-08-09 DIAGNOSIS — J988 Other specified respiratory disorders: Secondary | ICD-10-CM

## 2019-08-09 LAB — CBC WITH DIFFERENTIAL/PLATELET
Abs Immature Granulocytes: 0.06 10*3/uL (ref 0.00–0.07)
Basophils Absolute: 0 10*3/uL (ref 0.0–0.1)
Basophils Relative: 0 %
Eosinophils Absolute: 0 10*3/uL (ref 0.0–0.5)
Eosinophils Relative: 0 %
HCT: 32.6 % — ABNORMAL LOW (ref 36.0–46.0)
Hemoglobin: 10.4 g/dL — ABNORMAL LOW (ref 12.0–15.0)
Immature Granulocytes: 1 %
Lymphocytes Relative: 5 %
Lymphs Abs: 0.6 10*3/uL — ABNORMAL LOW (ref 0.7–4.0)
MCH: 28.2 pg (ref 26.0–34.0)
MCHC: 31.9 g/dL (ref 30.0–36.0)
MCV: 88.3 fL (ref 80.0–100.0)
Monocytes Absolute: 0.2 10*3/uL (ref 0.1–1.0)
Monocytes Relative: 1 %
Neutro Abs: 11.1 10*3/uL — ABNORMAL HIGH (ref 1.7–7.7)
Neutrophils Relative %: 93 %
Platelets: 419 10*3/uL — ABNORMAL HIGH (ref 150–400)
RBC: 3.69 MIL/uL — ABNORMAL LOW (ref 3.87–5.11)
RDW: 13.9 % (ref 11.5–15.5)
WBC: 11.9 10*3/uL — ABNORMAL HIGH (ref 4.0–10.5)
nRBC: 0 % (ref 0.0–0.2)

## 2019-08-09 LAB — BASIC METABOLIC PANEL
Anion gap: 12 (ref 5–15)
BUN: 29 mg/dL — ABNORMAL HIGH (ref 6–20)
CO2: 29 mmol/L (ref 22–32)
Calcium: 11.7 mg/dL — ABNORMAL HIGH (ref 8.9–10.3)
Chloride: 95 mmol/L — ABNORMAL LOW (ref 98–111)
Creatinine, Ser: 0.78 mg/dL (ref 0.44–1.00)
GFR calc Af Amer: >60 mL/min (ref >60)
GFR calc non Af Amer: >60 mL/min (ref >60)
Glucose, Bld: 317 mg/dL — ABNORMAL HIGH (ref 70–99)
Potassium: 4.2 mmol/L (ref 3.5–5.1)
Sodium: 136 mmol/L (ref 135–145)

## 2019-08-09 LAB — GLUCOSE, CAPILLARY
Glucose-Capillary: 217 mg/dL — ABNORMAL HIGH (ref 70–99)
Glucose-Capillary: 223 mg/dL — ABNORMAL HIGH (ref 70–99)
Glucose-Capillary: 226 mg/dL — ABNORMAL HIGH (ref 70–99)
Glucose-Capillary: 265 mg/dL — ABNORMAL HIGH (ref 70–99)
Glucose-Capillary: 317 mg/dL — ABNORMAL HIGH (ref 70–99)
Glucose-Capillary: 319 mg/dL — ABNORMAL HIGH (ref 70–99)
Glucose-Capillary: 350 mg/dL — ABNORMAL HIGH (ref 70–99)

## 2019-08-09 MED ORDER — INSULIN ASPART 100 UNIT/ML ~~LOC~~ SOLN
5.0000 [IU] | SUBCUTANEOUS | Status: DC
  Administered 2019-08-09 – 2019-08-10 (×6): 5 [IU] via SUBCUTANEOUS

## 2019-08-09 MED ORDER — GLYCOPYRROLATE 0.2 MG/ML IJ SOLN
0.1000 mg | INTRAMUSCULAR | Status: DC | PRN
  Administered 2019-08-09 – 2019-08-17 (×4): 0.1 mg via INTRAVENOUS
  Filled 2019-08-09 (×4): qty 1

## 2019-08-09 MED ORDER — INSULIN GLARGINE 100 UNIT/ML ~~LOC~~ SOLN
14.0000 [IU] | Freq: Every day | SUBCUTANEOUS | Status: DC
  Administered 2019-08-09: 14 [IU] via SUBCUTANEOUS
  Filled 2019-08-09 (×2): qty 0.14

## 2019-08-09 MED ORDER — INSULIN ASPART 100 UNIT/ML ~~LOC~~ SOLN
0.0000 [IU] | SUBCUTANEOUS | Status: DC
  Administered 2019-08-09: 15 [IU] via SUBCUTANEOUS
  Administered 2019-08-09 (×2): 7 [IU] via SUBCUTANEOUS
  Administered 2019-08-10: 11 [IU] via SUBCUTANEOUS
  Administered 2019-08-10 (×2): 7 [IU] via SUBCUTANEOUS
  Administered 2019-08-10: 15 [IU] via SUBCUTANEOUS
  Administered 2019-08-10 – 2019-08-11 (×3): 7 [IU] via SUBCUTANEOUS
  Administered 2019-08-11 (×2): 4 [IU] via SUBCUTANEOUS
  Administered 2019-08-11 – 2019-08-12 (×4): 7 [IU] via SUBCUTANEOUS
  Administered 2019-08-12 (×2): 4 [IU] via SUBCUTANEOUS
  Administered 2019-08-12 (×2): 7 [IU] via SUBCUTANEOUS
  Administered 2019-08-12: 4 [IU] via SUBCUTANEOUS
  Administered 2019-08-13 (×2): 7 [IU] via SUBCUTANEOUS
  Administered 2019-08-13: 4 [IU] via SUBCUTANEOUS
  Administered 2019-08-13 (×2): 7 [IU] via SUBCUTANEOUS
  Administered 2019-08-13 – 2019-08-14 (×2): 11 [IU] via SUBCUTANEOUS
  Administered 2019-08-14 (×4): 7 [IU] via SUBCUTANEOUS
  Administered 2019-08-14: 11 [IU] via SUBCUTANEOUS
  Administered 2019-08-15 (×4): 4 [IU] via SUBCUTANEOUS
  Administered 2019-08-15: 7 [IU] via SUBCUTANEOUS
  Administered 2019-08-15 – 2019-08-17 (×8): 4 [IU] via SUBCUTANEOUS
  Administered 2019-08-17: 7 [IU] via SUBCUTANEOUS
  Administered 2019-08-17: 4 [IU] via SUBCUTANEOUS
  Administered 2019-08-17: 7 [IU] via SUBCUTANEOUS
  Administered 2019-08-17 (×2): 3 [IU] via SUBCUTANEOUS
  Administered 2019-08-17: 4 [IU] via SUBCUTANEOUS
  Administered 2019-08-18: 3 [IU] via SUBCUTANEOUS
  Administered 2019-08-18: 7 [IU] via SUBCUTANEOUS
  Administered 2019-08-18 (×3): 4 [IU] via SUBCUTANEOUS
  Administered 2019-08-19: 3 [IU] via SUBCUTANEOUS
  Administered 2019-08-19 (×2): 4 [IU] via SUBCUTANEOUS
  Administered 2019-08-19: 3 [IU] via SUBCUTANEOUS
  Administered 2019-08-20 (×2): 4 [IU] via SUBCUTANEOUS
  Administered 2019-08-20: 3 [IU] via SUBCUTANEOUS
  Administered 2019-08-20: 4 [IU] via SUBCUTANEOUS
  Administered 2019-08-20 – 2019-08-21 (×3): 3 [IU] via SUBCUTANEOUS
  Administered 2019-08-21: 4 [IU] via SUBCUTANEOUS
  Administered 2019-08-22: 3 [IU] via SUBCUTANEOUS
  Administered 2019-08-22: 7 [IU] via SUBCUTANEOUS
  Administered 2019-08-22 (×2): 4 [IU] via SUBCUTANEOUS
  Administered 2019-08-23 – 2019-08-24 (×3): 3 [IU] via SUBCUTANEOUS
  Administered 2019-08-24: 4 [IU] via SUBCUTANEOUS
  Administered 2019-08-25 (×2): 3 [IU] via SUBCUTANEOUS
  Administered 2019-08-25 – 2019-08-26 (×3): 4 [IU] via SUBCUTANEOUS
  Administered 2019-08-26: 3 [IU] via SUBCUTANEOUS
  Administered 2019-08-26 – 2019-08-27 (×4): 4 [IU] via SUBCUTANEOUS
  Administered 2019-08-28: 3 [IU] via SUBCUTANEOUS
  Administered 2019-08-28 (×2): 4 [IU] via SUBCUTANEOUS
  Administered 2019-08-28 – 2019-08-29 (×2): 3 [IU] via SUBCUTANEOUS
  Administered 2019-08-29 (×2): 4 [IU] via SUBCUTANEOUS

## 2019-08-09 MED ORDER — METHYLPREDNISOLONE SODIUM SUCC 125 MG IJ SOLR
80.0000 mg | Freq: Four times a day (QID) | INTRAMUSCULAR | Status: DC
  Administered 2019-08-09 – 2019-08-15 (×22): 80 mg via INTRAVENOUS
  Filled 2019-08-09 (×22): qty 2

## 2019-08-09 NOTE — Progress Notes (Addendum)
Occupational Therapy Treatment Patient Details Name: Haley Hernandez MRN: [id number] DOB: December 20, 1960 Today's Date: 08/09/2019    History of present illness 59 y.o. female with past medical history of intracerebral hemorrhage 7 years ago, hypertension presents to the emergency department after family found her unresponsive with L hemipleiga and R gaze deviation. 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 Pt intubated and with EVD placed on 3/15. EVD removed 3/24. tracheostomy 3/27; EEG 3/29 moderate diffuse encephalopathy with no seizures (multiple events of RUE twitching occurred during EEG without EEG changes)   OT comments  Pt currently with rattled breathing sounds and reposition did help mildly. Pt noted to produce white foam secretions from trach with movement. Pt overall total (A) for all adls and total +2 total for any movement needs. Pt at high risk for skin break down and will need repositioning frequently. Ot to follow acutely with goals down graded this session.  Recommending pravalon boots bil to protect heels and decrease foot drop. Recommending L UE resting hand splint prefabrication from [employer] to help with contracture management and positioning. Please order if in agreement.    Follow Up Recommendations  SNF;Supervision/Assistance - 24 hour    Equipment Recommendations  Hospital bed;Other (comment)(hoyer)    Recommendations for Other Services      Precautions / Restrictions Precautions Precautions: Fall Precaution Comments: SBP<160, HOB>30 degrees       Mobility Bed Mobility Overal bed mobility: Needs Assistance Bed Mobility: Rolling Rolling: +2 for physical assistance;Total assist         General bed mobility comments: utilized the bed for support for upright chair position total (A). pt without any change in arousal with position changes. pt open eyes to name call but does not sustain. pt with R gaze  preference. pt with strong nystagmus with position changes.  Transfers                 General transfer comment: not appropriate at this time    Balance                                           ADL either performed or assessed with clinical judgement   ADL Overall ADL's : Needs assistance/impaired Eating/Feeding: NPO Eating/Feeding Details (indicate cue type and reason): tongue is wrapped and noted to have a yellow appears of drainage on the bandages                                   General ADL Comments: total (A) for all adls. no active engagement.      Vision   Vision Assessment?: Vision impaired- to be further tested in functional context Additional Comments: R gaze preference no attempts to scan L. dysconjugate gaze noted.    Perception     Praxis      Cognition Arousal/Alertness: Lethargic Behavior During Therapy: Flat affect Overall Cognitive Status: Difficult to assess                                 General Comments: on trach collar; significant angioedema with inability to attempt lipspeaking        Exercises Exercises: Other exercises Other Exercises Other Exercises: PROM for all extremities. Not following commands  for BIL UE engagement in task. pt with R hand movement noted in session.    Shoulder Instructions       General Comments pt rolled and skin without any break down. Skin dry and clean. Pt with increase risk for skin break down so positioned on L side for weight bearing L side of body. Pt also noted to have rattle breathing sounds. Pt positioned upright with decrease sound at the end of session. pt did produce secretions that are white foam during session via trach    Pertinent Vitals/ Pain       Pain Assessment: No/denies pain  Home Living                                          Prior Functioning/Environment              Frequency  Min 2X/week         Progress Toward Goals  OT Goals(current goals can now be found in the care plan section)  Progress towards OT goals: Not progressing toward goals - comment  Acute Rehab OT Goals Patient Stated Goal: Pt unable to participate in goal setting, to improve strength and reduce caregiver burden OT Goal Formulation: Patient unable to participate in goal setting Time For Goal Achievement: 08/23/19 Potential to Achieve Goals: Poor ADL Goals Pt Will Perform Grooming: with max assist;bed level Additional ADL Goal #1: Pt will perform bed mobility with Max A +2 in preparation for ADLs Additional ADL Goal #2: Pt will tolerate sitting at EOB with Max A in preparation for ADLs Additional ADL Goal #3: Pt will follow one step commands during ADLs   Plan Discharge plan remains appropriate    Co-evaluation    PT/OT/SLP Co-Evaluation/Treatment: Yes Reason for Co-Treatment: Complexity of the patient's impairments (multi-system involvement);For patient/therapist safety;To address functional/ADL transfers   OT goals addressed during session: Strengthening/ROM      AM-PAC OT "6 Clicks" Daily Activity     Outcome Measure   Help from another person eating meals?: Total Help from another person taking care of personal grooming?: Total Help from another person toileting, which includes using toliet, bedpan, or urinal?: Total Help from another person bathing (including washing, rinsing, drying)?: Total Help from another person to put on and taking off regular upper body clothing?: Total Help from another person to put on and taking off regular lower body clothing?: Total 6 Click Score: 6    End of Session Equipment Utilized During Treatment: Oxygen  OT Visit Diagnosis: Unsteadiness on feet (R26.81);Other abnormalities of gait and mobility (R26.89);Muscle weakness (generalized) (M62.81);Pain;Low vision, both eyes (H54.2)   Activity Tolerance Patient limited by lethargy   Patient Left in bed;with call  bell/phone within reach;with bed alarm set;with SCD's reapplied   Nurse Communication Mobility status;Precautions        Time: 9326-7124 OT Time Calculation (min): 23 min  Charges: OT General Charges $OT Visit: 1 Visit OT Treatments $Self Care/Home Management : 8-22 mins   Brynn, OTR/L  Acute Rehabilitation Services Pager: [phone] Office: [phone] .    Mateo Flow 08/09/2019, 10:44 AM

## 2019-08-09 NOTE — Progress Notes (Signed)
STROKE TEAM PROGRESS NOTE   INTERVAL HISTORY One daughter at bedside and another over the phone. Pt on trach collar, and secretions are less. Pt more awake alert today and open eyes and able to follow simple commands on the right, although lethargic. Still has tongue swelling an protrusion, ENT has consulted and recommend bite block and steroids. Currently she is on solumedrol. Discussed with daughters at bedside, may consider teeth removal as last resort. Also discussed with Dr. Benny Lennert, agree with steroids trial first.   Vitals:   08/09/19 0829 08/09/19 0916 08/09/19 1116 08/09/19 1124  BP: (!) 146/98 (!) 154/101 133/84   Pulse: (!) 107 (!) 106 99 (!) 102  Resp: (!) 26 (!) 26 (!) 25   Temp: 99 F (37.2 C)     TempSrc: Axillary     SpO2: 95% 94% 92% 97%  Weight:      Height:       CBC:  Recent Labs  Lab 08/08/19 0338 08/09/19 0431  WBC 14.8* 11.9*  NEUTROABS 11.3* 11.1*  HGB 9.6* 10.4*  HCT 30.4* 32.6*  MCV 90.2 88.3  PLT 356 017*   Basic Metabolic Panel:  Recent Labs  Lab 08/06/19 0431 08/06/19 0431 08/07/19 0457 08/07/19 0457 08/08/19 0338 08/09/19 0431  NA 135   < > 133*   < > 136 136  K 3.8   < > 4.0   < > 3.7 4.2  CL 95*   < > 93*   < > 95* 95*  CO2 30   < > 29   < > 31 29  GLUCOSE 185*   < > 211*   < > 207* 317*  BUN 19   < > 26*   < > 27* 29*  CREATININE 0.72   < > 0.92   < > 0.71 0.78  CALCIUM 11.0*   < > 11.4*   < > 11.6* 11.7*  MG 1.9  --  1.9  --   --   --    < > = values in this interval not displayed.   IMAGING past 24 hours No results found.  PHYSICAL EXAM  Obese middle-aged lady who is s/p tracheostomy and on trach collar. She has severe enlargement/swelling of the tongue with protrusion. Afebrile. Head is nontraumatic. Cardiac exam no murmur or gallop. Lungs are clear to auscultation. Distal pulses are well felt.   Neurological Exam : Patient eyes closed, however esiliy open with voice and eyes move to the side of voice on the right. Nonverbal,  but following commands today with right hand (showing fingers, make fist) and foot (wiggle toes). With eye opening, left eye downward gaze, right eye mid position with slight downward gaze, doll's eyes present. Pupils 3 mm sluggishly reactive, not consistently blinking to visual threat. Facial asymmetry difficult to assess due to significant tongue swelling protrusion outside of mouth. There is no spontaneous extremity movements, is trace withdrawal in lower extremities to pain. However, she was able to midly move fingers on the right hand to show fingers and make fist. Sensation, coordination and gait not tested.  ASSESSMENT/PLAN [name] is a 59 y.o. female with history of ICH 7 yrs ago, HTN found unresponsive. Found to have L sided hemiplegia, R gaze, SBP 250.  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 Moderate IVH. Mass effect w/ partial effacment  R lateral and 3rd ventricles. 75m L midline shift. Suspicious for early hydrocephalus.   Repeat CT head 3/16 interval L frontal lobe EVD w/ tip at R thalamus. Interval increased in R ICH now w/ 740mL midline shift. IVH extended into B lateral, 3rd and 4th ventricles. Prominent B superior ophthalmic vein suggestion increased ICP.   CT repeat 3/17 - stable MLS 74m50mhematoma and hydrocephalus  CT repeat 3/20 - stable hematoma and edeam. Decreased hydrocephalus.   CT repeat 3/25 evolution R thalamic ICH w/ decreasing clot. Increased ventricular volume s/p EVD removal. 1cm midline shift. Sinusitis.  CT repeat 3/26 - Stable volume of right thalamic and intraventricular hemorrhage. Ventriculomegaly with 2 mm of increased lateral ventricular diameter when compared yesterday.  CT repeat 3/28 - Slight interval decrease in size of right thalamic hemorrhage, but with similar degree of surrounding vasogenic edema.  Localized 9 mm right-to-left shift relatively unchanged. Intraventricular extension with small volume intraventricular blood, stable. Lateral ventriculomegaly not significantly changed.  CT repeat 4/5 improvement in R thalamic hemorrhage, small ICH w/ improvement. Stable mild ventricular enlargement. Slight improvement in L midline shift.   2D Echo EF 60-65%. Severe LVH. 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 Pt to remain Full Code with full scope of treatment.   Cytotoxic Cerebral Edema Obstructive hydrocephalus, resolved Induced Hypernatremia, resolved  EVD placed 3/15 (JoRonnald RampCT 3/16 - hematoma mild enlargement  CT 3/17 - stable hematoma and MLS  CT 3/20 - stable hematoma and edema with decreased hydrocephalus  EVD removed 3/24  CT repeat 3/25 evolution R thalamic ICH w/ decreasing clot. Increased ventricular volume s/p EVD removal. 1cm midline shift.   CT repeat 3/26 - Stable volume of right thalamic and intraventricular hemorrhage. Ventriculomegaly with 2 mm of increased lateral ventricular diameter when compared yesterday.  CT repeat 3/28 - Intraventricular extension with small volume intraventricular blood, stable. Lateral ventriculomegaly not significantly changed.  CT repeat 4/5 improvement in R thalamic hemorrhage, small ICH w/ improvement. Stable mild ventricular enlargement. Slight improvement in L midline shift.   Off 3% saline  Na normalized  Acute Respiratory Failure d/t ICH, resolved possible PNA Fever, resolved   Intubated in ED  T-max 100.6->102.2->100.2-> afebrile  WBCs  10.9->11.3->13.5->14.8->11.9  Currently not on antibiotics  CXR 08/06/19 - Left basilar atelectasis improved from the prior study.  U/A 08/06/19 - neg  Blood and Respiratory cultures - 4/3 no growth x 2 d; normal respiratory flora  Pneumonia - (ceftriaxone and metronidazole 3/16>>3/20)   Cefepime 07/23/19>>07/27/19 (EVD out)  CXR 3/24 Pulmonary vascular congestion and mild interstitial edema.  CXR 3/26 - Left lower lobe atelectasis/infiltrate. Small left pleural effusion again noted.  UA neg; UCx 3/24 - no growth - final   Blood culture 07/27/19 no growth   Respiratory culture - 3/26 - normal respirator flora  Blood Cultures - 3/26 - no growth   Urine cultures - 3/27 - no growth x 3 d  Levaquin IV 500 mg daily started 3/27>>3/31 HCAP  Tracheostomy placement - 3/27, sutures out 4/1  On trach collar with copious secretions  Hypertensive Emergency  SBP > 250 on arrival  Home meds:  Coreg, HCTZ and losartan . Off Cleviprex  . On Coreg 25 bid,  Chlorthalidone 12.5 . CCM added standing oxycodone for chronic pain as source of HTN, now off . SBP goal <180   . Long-term BP goal normotensive  Tongue swelling (macroglossia)  Off losartan, amlodipine   Avoid ACE or ARB   Teeth are biting into the tongue  ENT consulted today - recommend bite block between molars (RN unable to place) and steroid  Currently on solumedrol 19m q6h  May consider tooth removal as last resort if fails all possible measures   Dysphagia . Secondary to stroke . NPO . PEG placed 08/03/19 . On tube feeds @ 60  Hyperglycemia   A1C 5.7  On TF  CBG monitoring  On lantus 14 am  TF coverage novolog 2u q4h -> increase to 5u q4h with steroids now    SSI  Other Stroke Risk Factors  Obesity, Body mass index is 32.98 kg/m., recommend weight loss, diet and exercise as appropriate   Hx stroke/TIA  ICH 7 yrs ago. No data available in EPIC  obstructive sleep apnea  Other Active  Problems  Hypokalemia - resolved  Mild anemia of critical illness - Hb - 9.2->9.4->9.2->9.6->10.4  RUE rhythmic movement-doubt focal seizures or focal status epilepticus as continuous video-EEG monitoring -showed no seizures during- arm twitching seen during EEG without associated EEG change, low suspicion for motor seizures   Hospital day # 22   Neurology will sign off. Please call with questions. [facility]. Thanks for the consult.   JRosalin Hawking MD PhD Stroke Neurology 08/09/2019 12:23 PM    To contact Stroke Continuity provider, please refer to [email or web address] After hours, contact General Neurology

## 2019-08-09 NOTE — Progress Notes (Signed)
Inpatient Diabetes Program Recommendations  AACE/ADA: New Consensus Statement on Inpatient Glycemic Control   Target Ranges:  Prepandial:   less than 140 mg/dL      Peak postprandial:   less than 180 mg/dL (1-2 hours)      Critically ill patients:  140 - 180 mg/dL  Results for NAIDA, ESCALANTE (MRN [id number]) as of 08/09/2019 10:56  Ref. Range 08/09/2019 00:00 08/09/2019 03:39 08/09/2019 07:47  Glucose-Capillary Latest Ref Range: 70 - 99 mg/dL 092 (H) 330 (H) 076 (H)   Results for RICARDO, KAYES (MRN [id number]) as of 08/09/2019 10:56  Ref. Range 08/08/2019 08:45 08/08/2019 12:44 08/08/2019 15:44 08/08/2019 21:11 08/08/2019 21:52  Glucose-Capillary Latest Ref Range: 70 - 99 mg/dL 625 (H) 638 (H) 937 (H) 227 (H) 238 (H)   Review of Glycemic Control  Current orders for Inpatient glycemic control: Lantus 14 units daily, Novolog 0-20 units Q4H, Novolog 2 units Q4H for tube feeding coverage; Solumedrol 60 mg Q6H  Inpatient Diabetes Program Recommendations:   Insulin - Basal: Noted Levemir 14 units daily is ordered.  Insulin - Tube Feeding Coverage: If steroids are continued, please consider increasing Novolog tube feeding coverage to Novolog 5 units Q4H.  Thanks, Orlando Penner, RN, MSN, CDE Diabetes Coordinator Inpatient Diabetes Program [phone] (Team Pager from 8am to 5pm)

## 2019-08-09 NOTE — Progress Notes (Signed)
NAME:  Haley Hernandez, MRN:  [id number], DOB:  August 22, 1960, LOS: 22 ADMISSION DATE:  07/18/2019, CONSULTATION DATE:  07/19/2019 REFERRING MD:  Dr. Laurence Slate, CHIEF COMPLAINT:  ICH  Brief History   59 yo female found with AMS and Lt hemiplegia, SBP 250 with resulting ICH of Rt thalamus and BG with IVH.  Intubated for airway protection.  Required tracheostomy due to altered mental status and tongue swelling.  Past Medical History  HTN, OSA, ICH  Consults:  NSGY Neurology ENT Palliative care  Procedures:  3/15 ETT >> 3/27 Trach 3/28 >> 3/16 PICC >>   Micro Data:  SARS CoV2 PCR 3/15 >> negative Sputum 3/17 >> negative Sputum 3/20 >> negative Blood 3/20 >> negative Blood, urine 3/26 >>negative Blood 4/03 >>   Interim history/subjective:  Trach collar.  Objective   BP (!) 146/98   Pulse (!) 107   Temp 99 F (37.2 C) (Axillary)   Resp (!) 26   Ht 5' (1.524 m)   Wt 76.6 kg   SpO2 95%   BMI 32.98 kg/m   I/O last 3 completed shifts: In: 1354 [I.V.:10; Other:100; NG/GT:1244] Out: 2650 [Urine:2550; Stool:100]  Physical exam:  General - trach collar Eyes - pupils reactive ENT - protuberant tongue with gauze on tongue, trach site with clear secretions Cardiac - regular rate/rhythm, no murmur Chest - b/l rhonchi Abdomen - soft, non tender, + bowel sounds Extremities - no cyanosis, clubbing, or edema Skin - no rashes Neuro - not following commands  Resolved problems:  Aspiration pneumonia  Assessment & Plan:   Acute hypoxic respiratory failure 2nd to compromised airway in setting of ICH and macroglossia. Hx of OSA. Tracheostomy status. - trach care - mental status and tongue swelling barriers to decannulation - goal SpO2 > 92% - f/u CXR as needed  Macroglossia.   - seen by ENT 4/05 - felt to be related to drug reaction and tongue biting - keep bite block in place to prevent further tongue biting - receiving solumedrol  ICH. - per neurology  HTN. - per  primary team  Goals of care. - palliative care consulted >> family wishes to continue full medical care  PCCM will f/u weekly for trach care.  Call if help needed sooner.  Labs:   CMP Latest Ref Rng & Units 08/09/2019 08/08/2019 08/07/2019  Glucose 70 - 99 mg/dL 242(P) 536(R) 443(X)  BUN 6 - 20 mg/dL 54(M) 08(Q) 76(P)  Creatinine 0.44 - 1.00 mg/dL 9.50 9.32 6.71  Sodium 135 - 145 mmol/L 136 136 133(L)  Potassium 3.5 - 5.1 mmol/L 4.2 3.7 4.0  Chloride 98 - 111 mmol/L 95(L) 95(L) 93(L)  CO2 22 - 32 mmol/L 29 31 29   Calcium 8.9 - 10.3 mg/dL 11.7(H) 11.6(H) 11.4(H)  Total Protein 6.5 - 8.1 g/dL - 7.5 -  Total Bilirubin 0.3 - 1.2 mg/dL - 0.6 -  Alkaline Phos 38 - 126 U/L - 113 -  AST 15 - 41 U/L - 47(H) -  ALT 0 - 44 U/L - 107(H) -    CBC Latest Ref Rng & Units 08/09/2019 08/08/2019 08/07/2019  WBC 4.0 - 10.5 K/uL 11.9(H) 14.8(H) 13.5(H)  Hemoglobin 12.0 - 15.0 g/dL 10.4(L) 9.6(L) 9.2(L)  Hematocrit 36.0 - 46.0 % 32.6(L) 30.4(L) 29.8(L)  Platelets 150 - 400 K/uL 419(H) 356 294   ABG    Component Value Date/Time   PHART 7.362 07/19/2019 0105   PCO2ART 50.3 (H) 07/19/2019 0105   PO2ART 90.0 07/19/2019 0105   HCO3 28.7 (  H) 07/19/2019 0105   TCO2 30 07/19/2019 0105   O2SAT 97.0 07/19/2019 0105    CBG (last 3)  Recent Labs    08/09/19 0000 08/09/19 0339 08/09/19 0747  GLUCAP 265* 350* 319*    Signature:  Chesley Mires, MD [place] Pager - [phone] 08/09/2019, 9:49 AM

## 2019-08-09 NOTE — Progress Notes (Signed)
Orthopedic Tech Progress Note Patient Details:  Haley Hernandez Dec 19, 1960 [id number] Called in order to [employer]  For a RESTING HAND SPLINT Patient ID: Haley Hernandez, female   DOB: [birth year], 59 y.o.   MRN: [id number]   Donald Pore 08/09/2019, 1:25 PM

## 2019-08-09 NOTE — Progress Notes (Addendum)
PROGRESS NOTE  Haley Hernandez LAG:[id number] DOB: July 25, 1960 DOA: 07/18/2019 PCP: No primary care provider on file.  Brief History   Haley Hernandez a 59 y.o.femalewith past medical history of intracerebral hemorrhage 7 years ago, hypertension presents to the emergency department after family found her unresponsive.  Last known normal 7:45 PM,family found her unresponsive. EMS was called and patient noted to be plegic on the left side and gaze deviation to the right. Blood pressure was 122 systolic.  When patient arrived to [facility] ER,she was immediately taken for stat CT head which demonstrated a large right thalamic hemorrhage with intraventricular extension and early hydrocephalus.She was emergently intubated for airway protection. Labetalol and Cleviprex was initiated for blood pressure control,initial delay as patient needed to beintubated.Neurosurgery was consulted for EVD placement.  Admitted to neurology ICU on 3/15 for ICH status post tracheostomy and PEG Neurology transfer to [facility] on April 3.  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 Both have been discontinued. ENT was consulted as tongue continued to protrude 4-5 cm past the teeth and 2 cm laterally this morning. Their recommendation is steroids and use of a "Bite Block" manufactured out of tongue depressor guaze and tape. I have communicated this to nursing, but they tongue is so swollen, that they have been unable to fit the bite block in her mouth. She is receiving iV solumedrol. The patient was seen by Dr. Erlinda Hong from neurology today. He had recommended to the family that oral surgery be consulted to remove all of the patient's teeth, so that she wouldn't bite down on her tongue. I have discussed this with Dr. Erlinda Hong and the patient's daughter. I feel it would be prudent to continue steroids and allow them to reduce swelling rather than permanently remove teeth for a  temporary problem. I also feel that it would be quite traumatic and difficult to remove teeth in such a crowded environment. They have expressed understanding.  Consultants  . ENT . Palliative Care . Neurology . Interventional Radiology . PCCM . Neurosurgery  Procedures  . EVD placement . Tracheostomy . Intubation and mechanical ventilation  Antibiotics   Anti-infectives (From admission, onward)   Start     Dose/Rate Route Frequency Ordered Stop   08/03/19 1442  ceFAZolin (ANCEF) 2-4 GM/100ML-% IVPB    Note to Pharmacy: [name]: cabinet override      08/03/19 1442 08/03/19 1458   07/30/19 0930  levofloxacin (LEVAQUIN) IVPB 500 mg     500 mg 100 mL/hr over 60 Minutes Intravenous Every 24 hours 07/30/19 0915 08/03/19 1101   07/23/19 0900  ceFEPIme (MAXIPIME) 2 g in sodium chloride 0.9 % 100 mL IVPB  Status:  Discontinued     2 g 200 mL/hr over 30 Minutes Intravenous Every 8 hours 07/23/19 0854 07/27/19 1503   07/19/19 1100  cefTRIAXone (ROCEPHIN) 2 g in sodium chloride 0.9 % 100 mL IVPB  Status:  Discontinued     2 g 200 mL/hr over 30 Minutes Intravenous Every 24 hours 07/19/19 1052 07/23/19 0842   07/19/19 1100  metroNIDAZOLE (FLAGYL) IVPB 500 mg     500 mg 100 mL/hr over 60 Minutes Intravenous Every 8 hours 07/19/19 1052 07/24/19 0301     Subjective  The patient is awake and responsive. Due to macroglossia, she is not able to speak. She is having coarse upper airway secretions.  Objective   Vitals:  Vitals:   08/09/19 1116 08/09/19 1124  BP: 133/84  Pulse: 99 (!) 102  Resp: (!) 25   Temp:    SpO2: 92% 97%   Exam:  Constitutional:  . The patient is awake. Moderate distress from large protruding tongue. ENMT:  . Tongue very large. Protruding beyond lips by 4 cm and laterally by 1+ cm on each side.  Respiratory:  . No increased work of breathing. . No wheezes, rales, or rhonchi . No tactile fremitus . There are coarse upper airway  sounds. Cardiovascular:  . Regular rate and rhythm . No murmurs, ectopy, or gallups. . No lateral PMI. No thrills. Abdomen:  . Abdomen is soft, non-tender, non-distended . No hernias, masses, or organomegaly . Normoactive bowel sounds.  Musculoskeletal:  . No cyanosis, clubbing, or edema Skin:  . No rashes, lesions, ulcers . palpation of skin: no induration or nodules Neurologic:  . Patient unable to cooperate with exam. Psychiatric:  Patient unable to cooperate with exam.  I have personally reviewed the following:   Today's Data  . Vitals, BMP, CBC  Micro Data  . Urine culture (08/06/2019): Insignificant growth . Blood culture x 2 (08/06/2019): No growth  Scheduled Meds: . carvedilol  25 mg Per Tube BID  . chlorhexidine gluconate (MEDLINE KIT)  15 mL Mouth Rinse BID  . Chlorhexidine Gluconate Cloth  6 each Topical Daily  . chlorthalidone  12.5 mg Oral Daily  . famotidine  20 mg Per Tube BID  . guaiFENesin  10 mL Per Tube BID  . insulin aspart  0-20 Units Subcutaneous Q4H  . insulin aspart  2 Units Subcutaneous Q4H  . insulin glargine  14 Units Subcutaneous Daily  . mouth rinse  15 mL Mouth Rinse 10 times per day  . methylPREDNISolone (SOLU-MEDROL) injection  60 mg Intravenous Q6H  . sodium chloride flush  10-40 mL Intracatheter Q12H  . triamcinolone   Mouth/Throat TID   Continuous Infusions: . feeding supplement (JEVITY 1.2 CAL) 1,000 mL (08/09/19 4008)    Active Problems:   ICH (intracerebral hemorrhage) (HCC)   Acute respiratory failure with hypoxemia (HCC)   Hypokalemia   Hypophosphatemia   Fever   Palliative care by specialist   Goals of care, counseling/discussion   Full code status   Tongue swelling   LOS: 22 days   A & P  Fever work-up (fever x1 on 4/3 am): She was treated for pneumonia from March 16 to March 20 is with Rocephin and metronidazole. She was treated with cefepime from March 20 is to March 24 when she had EVD. She was treated with Levaquin  from March 27 to March 31 for HCAP. Repeat UA/urine culture on April 3 demonstrating only insignificant growth. Blood cultures x 2 collected on that date as well as 3/24, 3/26 have had no growth. Repeat tracheal aspirates from 08/06/2019 have had no growth.  Gram stain has demonstrated only normal respiratory flora. 4/3 CXR demonstrated minimal left basilar atelectasis is noted but improved from the prior study. Family report patient has been having loose stools since started tube feeds, she has a rectal tube in. Only 100 cc of stool output on 08/08/2019.  Angioedema: ENT consulted. I appreciate Dr. Janace Hoard' input. Bite block has been ordered and discussed with nursing to prevent the patient from biting her tongue. She has been started on IV solumedrol. Amlodipine and losartan have been discontinued. Airway intact due to pre-existing tracheostomy. ENT was consulted as tongue continued to protrude 4-5 cm past the teeth and 2 cm laterally this morning. Their recommendation is steroids  and use of a "Bite Block" manufactured out of tongue depressor guaze and tape. I have communicated this to nursing, but they tongue is so swollen, that they have been unable to fit the bite block in her mouth. She is receiving iV solumedrol. The patient was seen by Dr. Erlinda Hong from neurology today. He had recommended to the family that oral surgery be consulted to remove all of the patient's teeth, so that she wouldn't bite down on her tongue. I have discussed this with Dr. Erlinda Hong and the patient's daughter. I feel it would be prudent to continue steroids and allow them to reduce swelling rather than permanently remove teeth for a temporary problem. I also feel that it would be quite traumatic and difficult to remove teeth in such a crowded environment. They have expressed understanding.  Decreased mentation: Daughter report patient was awake 2 days ago. Per consultants patient with worsening mental status since yesterday. Palliative care is  following. Sedating medications have been stopped.  Hemorrhagic stroke from hypertensive urgency emergency s/p external ventricular drainage (present on admission): She has history of ICH 7 years ago. Neurology following, will follow recommendation.  Acute respiratory failure due to ICH: Now status post tracheostomy on March 27/status post PEG placement on March 3. RT has changed out trach. The patient has coarse upper airway sounds. She has been started on Robinul IV to control secretions.  Anemia from acute illness: Hemoglobin on presentation was normal. Currently hemoglobin 9.6. Monitor.  Hypertension: The patient presented with hypertension/ urgency emergency. Blood pressure is under fair control on Coreg, hydralazine, and chlorthalidone listed as home medication. Amlodipine and losartan stopped.  Obesity: Body mass index is 32.08 kg/m.  I have seen and examined this patient myself. I have spent 45 minutes in her evaluation and care.  DVT Prophylaxis: SCD's Code Status: full Family Communication: I have discussed the patient in detail with the daughter, Haley Hernandez. Disposition Plan: The patient came from home. Anticipate discharge to SNF/LTAC. Awaiting PT/OT eval. Barriers to d/c OR conditions which need to be met to effect a safe d/c: Need LTAC level of care or skilled nursing facility can take care of trach PEG patient, however patient has no insurance  Merit Maybee, DO [facility] Direct contact: see [email or web address]  7PM-7AM contact night coverage as above 08/09/2019, 3:45 PM  LOS: 21 days

## 2019-08-09 NOTE — Progress Notes (Addendum)
Physical Therapy Treatment Patient Details Name: Haley Hernandez MRN: [id number] DOB: 01/30/1961 Today's Date: 08/09/2019    History of Present Illness 59 y.o. female with past medical history of intracerebral hemorrhage 7 years ago, hypertension presents to the emergency department after family found her unresponsive with L hemipleiga and R gaze deviation. 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 Pt intubated and with EVD placed on 3/15. EVD removed 3/24. tracheostomy 3/27; EEG 3/29 moderate diffuse encephalopathy with no seizures (multiple events of RUE twitching occurred during EEG without EEG changes)    PT Comments    Patient seen for PT treatment. Pt opens eyes to voice and maintains R gaze preference. Pt requires total A +2 for all mobility this session. Pt with minimal movement of R UE/LE.  Pt positioned toward effected side end of session. Recommend use of prevalon boots for decreased risk of skin breakdown at heels and contractures. PT will continue to follow acutely.   ADDENDUM ([id number]) : contacted by [name], PTA re: patient's continued decr responsiveness. Also discussed with [name], OT re: session today. Then noted Dr. [name] note (neurology) from later in the day and pt was following some simple commands and tracking for him. He has been following pt daily and previous notes have not indicated her ability to do these things. Based on his assessment, will continue PT at 2x/week to continue to try to elicit appropriate responses and ability to participate. Will attempt to assure she has not had any medications with sedative effects prior to attempts at therapy.   Barry Brunner, PT pager [phone]    Follow Up Recommendations  SNF;LTACH;Supervision/Assistance - 24 hour     Equipment Recommendations  Other (comment)(TBD)    Recommendations for Other Services       Precautions / Restrictions Precautions Precautions:  Fall Precaution Comments: SBP<160, HOB>30 degrees    Mobility  Bed Mobility Overal bed mobility: Needs Assistance Bed Mobility: Rolling Rolling: +2 for physical assistance;Total assist         General bed mobility comments: utilized the bed for support for upright chair position total (A). pt without any change in arousal with position changes. pt open eyes to name call but does not sustain. pt with R gaze preference. pt with strong nystagmus with position changes.  Transfers                 General transfer comment: not appropriate at this time  Ambulation/Gait                 Stairs             Wheelchair Mobility    Modified Rankin (Stroke Patients Only)       Balance                                            Cognition Arousal/Alertness: Lethargic Behavior During Therapy: Flat affect Overall Cognitive Status: Difficult to assess                                 General Comments: on trach collar; significant angioedema with inability to attempt lipspeaking      Exercises Other Exercises Other Exercises: PROM for all extremities; R LE movement mostly appears spontaneous and/or reflexive; pt pushed R foot against  therapist's hand X2 when given cue to "straighten leg" but very minimal force     General Comments        Pertinent Vitals/Pain Pain Assessment: No/denies pain    Home Living                      Prior Function            PT Goals (current goals can now be found in the care plan section) Progress towards PT goals: Not progressing toward goals - comment    Frequency    Min 3X/week      PT Plan Current plan remains appropriate    Co-evaluation PT/OT/SLP Co-Evaluation/Treatment: Yes Reason for Co-Treatment: Complexity of the patient's impairments (multi-system involvement);For patient/therapist safety;To address functional/ADL transfers PT goals addressed during session:  Mobility/safety with mobility;Strengthening/ROM        AM-PAC PT "6 Clicks" Mobility   Outcome Measure  Help needed turning from your back to your side while in a flat bed without using bedrails?: Total Help needed moving from lying on your back to sitting on the side of a flat bed without using bedrails?: Total Help needed moving to and from a bed to a chair (including a wheelchair)?: Total Help needed standing up from a chair using your arms (e.g., wheelchair or bedside chair)?: Total Help needed to walk in hospital room?: Total Help needed climbing 3-5 steps with a railing? : Total 6 Click Score: 6    End of Session   Activity Tolerance: Patient tolerated treatment well Patient left: in bed;with call bell/phone within reach;with bed alarm set;with SCD's reapplied Nurse Communication: Need for lift equipment PT Visit Diagnosis: Other symptoms and signs involving the nervous system (R29.898);Muscle weakness (generalized) (M62.81);Hemiplegia and hemiparesis Hemiplegia - Right/Left: Left Hemiplegia - caused by: Nontraumatic intracerebral hemorrhage     Time: 0859-0922 PT Time Calculation (min) (ACUTE ONLY): 23 min  Charges:  $Therapeutic Activity: 8-22 mins                     [name], PTA Acute Rehabilitation Services Pager: [phone] Office: [phone]     [name] 08/09/2019, 2:14 PM

## 2019-08-10 LAB — GLUCOSE, CAPILLARY
Glucose-Capillary: 227 mg/dL — ABNORMAL HIGH (ref 70–99)
Glucose-Capillary: 306 mg/dL — ABNORMAL HIGH (ref 70–99)
Glucose-Capillary: 266 mg/dL — ABNORMAL HIGH (ref 70–99)
Glucose-Capillary: 219 mg/dL — ABNORMAL HIGH (ref 70–99)
Glucose-Capillary: 227 mg/dL — ABNORMAL HIGH (ref 70–99)
Glucose-Capillary: 197 mg/dL — ABNORMAL HIGH (ref 70–99)

## 2019-08-10 LAB — BASIC METABOLIC PANEL
Anion gap: 12 (ref 5–15)
BUN: 55 mg/dL — ABNORMAL HIGH (ref 6–20)
CO2: 28 mmol/L (ref 22–32)
Calcium: 11.9 mg/dL — ABNORMAL HIGH (ref 8.9–10.3)
Chloride: 95 mmol/L — ABNORMAL LOW (ref 98–111)
Creatinine, Ser: 0.9 mg/dL (ref 0.44–1.00)
GFR calc Af Amer: >60 mL/min (ref >60)
GFR calc non Af Amer: >60 mL/min (ref >60)
Glucose, Bld: 266 mg/dL — ABNORMAL HIGH (ref 70–99)
Potassium: 4.8 mmol/L (ref 3.5–5.1)
Sodium: 135 mmol/L (ref 135–145)

## 2019-08-10 MED ORDER — INSULIN ASPART 100 UNIT/ML ~~LOC~~ SOLN
7.0000 [IU] | SUBCUTANEOUS | Status: DC
  Administered 2019-08-10 – 2019-08-30 (×109): 7 [IU] via SUBCUTANEOUS

## 2019-08-10 MED ORDER — INSULIN GLARGINE 100 UNIT/ML ~~LOC~~ SOLN
24.0000 [IU] | Freq: Every day | SUBCUTANEOUS | Status: DC
  Administered 2019-08-10 – 2019-08-31 (×22): 24 [IU] via SUBCUTANEOUS
  Filled 2019-08-10 (×22): qty 0.24

## 2019-08-10 MED ORDER — MAGIC MOUTHWASH W/LIDOCAINE
5.0000 mL | Freq: Three times a day (TID) | ORAL | Status: DC
  Administered 2019-08-10 – 2019-08-31 (×62): 5 mL via ORAL
  Filled 2019-08-10 (×66): qty 5

## 2019-08-10 MED ORDER — FREE WATER
140.0000 mL | Freq: Four times a day (QID) | Status: DC
  Administered 2019-08-10 – 2019-08-26 (×64): 140 mL

## 2019-08-10 NOTE — Progress Notes (Signed)
PROGRESS NOTE  Haley Hernandez YFV:[id number] DOB: 06-15-1960 DOA: 07/18/2019 PCP: No primary care provider on file.  Brief History   Haley Hernandez a 59 y.o.femalewith past medical history of intracerebral hemorrhage 7 years ago, hypertension presents to the emergency department after family found her unresponsive.  Last known normal 7:45 PM,family found her unresponsive. EMS was called and patient noted to be plegic on the left side and gaze deviation to the right. Blood pressure was 163 systolic.  When patient arrived to [facility] ER,she was immediately taken for stat CT head which demonstrated a large right thalamic hemorrhage with intraventricular extension and early hydrocephalus.She was emergently intubated for airway protection. Labetalol and Cleviprex was initiated for blood pressure control,initial delay as patient needed to beintubated.Neurosurgery was consulted for EVD placement.  Admitted to neurology ICU on 3/15 for ICH status post tracheostomy and PEG Neurology transfer to [facility] on April 3.  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 Both have been discontinued. ENT was consulted as tongue continued to protrude 4-5 cm past the teeth and 2 cm laterally this morning. Their recommendation is steroids and use of a "Bite Block" manufactured out of tongue depressor guaze and tape. I have communicated this to nursing, but they tongue is so swollen, that they have been unable to fit the bite block in her mouth. She is receiving iV solumedrol. The patient was seen by Dr. Erlinda Hong from neurology today. He had recommended to the family that oral surgery be consulted to remove all of the patient's teeth, so that she wouldn't bite down on her tongue. I have discussed this with Dr. Erlinda Hong and the patient's daughter. I feel it would be prudent to continue steroids and allow them to reduce swelling rather than permanently remove teeth for a  temporary problem. I also feel that it would be quite traumatic and difficult to remove teeth in such a crowded environment. They have expressed understanding.  Consultants   ENT  Palliative Care  Neurology  Interventional Radiology  PCCM  Neurosurgery  Procedures   EVD placement  Tracheostomy  Intubation and mechanical ventilation  Antibiotics   Anti-infectives (From admission, onward)   Start     Dose/Rate Route Frequency Ordered Stop   08/03/19 1442  ceFAZolin (ANCEF) 2-4 GM/100ML-% IVPB    Note to Pharmacy: [name]: cabinet override      08/03/19 1442 08/03/19 1458   07/30/19 0930  levofloxacin (LEVAQUIN) IVPB 500 mg     500 mg 100 mL/hr over 60 Minutes Intravenous Every 24 hours 07/30/19 0915 08/03/19 1101   07/23/19 0900  ceFEPIme (MAXIPIME) 2 g in sodium chloride 0.9 % 100 mL IVPB  Status:  Discontinued     2 g 200 mL/hr over 30 Minutes Intravenous Every 8 hours 07/23/19 0854 07/27/19 1503   07/19/19 1100  cefTRIAXone (ROCEPHIN) 2 g in sodium chloride 0.9 % 100 mL IVPB  Status:  Discontinued     2 g 200 mL/hr over 30 Minutes Intravenous Every 24 hours 07/19/19 1052 07/23/19 0842   07/19/19 1100  metroNIDAZOLE (FLAGYL) IVPB 500 mg     500 mg 100 mL/hr over 60 Minutes Intravenous Every 8 hours 07/19/19 1052 07/24/19 0301     Subjective  The patient is awake and responsive. Due to macroglossia, she is not able to speak.   Objective   Vitals:  Vitals:   08/10/19 0845 08/10/19 1135  BP: (!) 153/94 (!) 156/94  Pulse: 93  91  Resp: (!) 22 (!) 24  Temp:    SpO2: 95% 93%   Exam:  Constitutional:   The patient is awake. No acute distress. ENMT:   Tongue very large. Protruding beyond lips by 4 cm and laterally by 1+ cm on each side. Appears unchanged from yesterday. Respiratory:   No increased work of breathing.  No wheezes, rales, or rhonchi  No tactile fremitus  There are coarse upper airway sounds. Cardiovascular:   Regular rate  and rhythm  No murmurs, ectopy, or gallups.  No lateral PMI. No thrills. Abdomen:   Abdomen is soft, non-tender, non-distended  No hernias, masses, or organomegaly  Normoactive bowel sounds.  Musculoskeletal:   No cyanosis, clubbing, or edema Skin:   No rashes, lesions, ulcers  palpation of skin: no induration or nodules Neurologic:   Patient unable to cooperate with exam. Psychiatric:  Patient unable to cooperate with exam.  I have personally reviewed the following:   Today's Data   Vitals, BMP, CBC  Micro Data   Urine culture (08/06/2019): Insignificant growth  Blood culture x 2 (08/06/2019): No growth  Scheduled Meds:  carvedilol  25 mg Per Tube BID   chlorhexidine gluconate (MEDLINE KIT)  15 mL Mouth Rinse BID   Chlorhexidine Gluconate Cloth  6 each Topical Daily   chlorthalidone  12.5 mg Oral Daily   famotidine  20 mg Per Tube BID   free water  140 mL Per Tube Q6H   guaiFENesin  10 mL Per Tube BID   insulin aspart  0-20 Units Subcutaneous Q4H   insulin aspart  5 Units Subcutaneous Q4H   insulin glargine  24 Units Subcutaneous Daily   magic mouthwash w/lidocaine  5 mL Oral TID   mouth rinse  15 mL Mouth Rinse 10 times per day   methylPREDNISolone (SOLU-MEDROL) injection  80 mg Intravenous Q6H   sodium chloride flush  10-40 mL Intracatheter Q12H   triamcinolone   Mouth/Throat TID   Continuous Infusions:  feeding supplement (JEVITY 1.2 CAL) 1,000 mL (08/09/19 2044)    Active Problems:   ICH (intracerebral hemorrhage) (HCC)   Acute respiratory failure with hypoxemia (HCC)   Hypokalemia   Hypophosphatemia   Fever   Palliative care by specialist   Goals of care, counseling/discussion   Full code status   Tongue swelling   LOS: 23 days   A & P  Fever work-up (fever x1 on 4/3 am): She was treated for pneumonia from March 16 to March 20 is with Rocephin and metronidazole. She was treated with cefepime from March 20 is to March 24 when  she had EVD. She was treated with Levaquin from March 27 to March 31 for HCAP. Repeat UA/urine culture on April 3 demonstrating only insignificant growth. Blood cultures x 2 collected on that date as well as 3/24, 3/26 have had no growth. Repeat tracheal aspirates from 08/06/2019 have had no growth.  Gram stain has demonstrated only normal respiratory flora. 4/3 CXR demonstrated minimal left basilar atelectasis is noted but improved from the prior study. Family report patient has been having loose stools since started tube feeds, she has a rectal tube in. Only 100 cc of stool output on 08/08/2019.  Angioedema: ENT consulted. I appreciate Dr. Janace Hoard' input. Bite block has been ordered and discussed with nursing to prevent the patient from biting her tongue. She has been started on IV solumedrol. Amlodipine and losartan have been discontinued. Airway intact due to pre-existing tracheostomy. ENT was consulted as tongue  continued to protrude 4-5 cm past the teeth and 2 cm laterally this morning. Their recommendation is steroids and use of a "Bite Block" manufactured out of tongue depressor guaze and tape. I have communicated this to nursing, but they tongue is so swollen, that they have been unable to fit the bite block in her mouth. She is receiving iV solumedrol. The patient was seen by Dr. Erlinda Hong from neurology today. He had recommended to the family that oral surgery be consulted to remove all of the patient's teeth, so that she wouldn't bite down on her tongue. I have discussed this with Dr. Erlinda Hong and the patient's daughter. I feel it would be prudent to continue steroids and allow them to reduce swelling rather than permanently remove teeth for a temporary problem. I also feel that it would be quite traumatic and difficult to remove teeth in such a crowded environment. They have expressed understanding.  Decreased mentation: Daughter report patient was awake 2 days ago. Per consultants patient with worsening mental  status since yesterday. Palliative care is following. Sedating medications have been stopped.  Hemorrhagic stroke from hypertensive urgency emergency s/p external ventricular drainage (present on admission): She has history of ICH 7 years ago. Neurology following, will follow recommendation.  Acute respiratory failure due to ICH: Now status post tracheostomy on March 27/status post PEG placement on March 3. RT has changed out trach. The patient has coarse upper airway sounds. She has been started on Robinul IV to control secretions.  Anemia from acute illness: Hemoglobin on presentation was normal. Currently hemoglobin 9.6. Monitor.  Hypertension: The patient presented with hypertension/ urgency emergency. Blood pressure is under fair control on Coreg, hydralazine, and chlorthalidone listed as home medication. Amlodipine and losartan stopped.  Obesity: Body mass index is 32.08 kg/m.  I have seen and examined this patient myself. I have spent 35 minutes in her evaluation and care.  DVT Prophylaxis: SCD's Code Status: full Family Communication: I have discussed the patient in detail with the daughter, Haley Hernandez. Disposition Plan: The patient came from home. Anticipate discharge to SNF/LTAC. Awaiting PT/OT eval. Barriers to d/c OR conditions which need to be met to effect a safe d/c:Improvement in severe tongue swelling. Need LTAC level of care or skilled nursing facility can take care of trach PEG patient, however patient has no insurance.  Haley Swayze, DO [facility] Direct contact: see [email or web address]  7PM-7AM contact night coverage as above 08/10/2019, 2:38 PM  LOS: 21 days

## 2019-08-10 NOTE — Progress Notes (Signed)
Nutrition Follow-up  **RD working remotely**  DOCUMENTATION CODES:   Obesity unspecified  INTERVENTION:  Continue  Jevity 1.2 @ 60 ml/hr (1440 ml/day)  Provides: 1728 kcal, 80 grams protein, and 1167 ml free water.   Add 148m free water flush QID (17256mfree water total with TF)  NUTRITION DIAGNOSIS:   Inadequate oral intake related to inability to eat as evidenced by NPO status.  Ongoing.   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  3/24 cortrak tube placed, tip gastric 3/27 trach placed 3/31 Cortrak removed, TF on hold for PEG placement  4/4 pt developed macroglossia, currently being treated with bite block and steroids  Per RN pt is tolerating TC and TF via PEG.   Current tube feeding orders: Jevity 1.2 cal @ 6041mr   UOP: 625m57m4 hours I/O: +3,336.7ml 53mce admit  Medications reviewed and include: Pepcid, Novolog, Lantus, Solu-medrol  Labs reviewed: CBG's: [phone] (Diabetes Coordinator following)  Diet Order:   Diet Order    None      EDUCATION NEEDS:   No education needs have been identified at this time  Skin:  Skin Assessment: Skin Integrity Issues: Skin Integrity Issues:: Other (Comment) Other: laceration under tongue  Last BM:  4/6  Height:   Ht Readings from Last 1 Encounters:  07/25/19 5' (1.524 m)    Weight:   Wt Readings from Last 1 Encounters:  08/09/19 76.6 kg    BMI:  Body mass index is 32.98 kg/m.  Estimated Nutritional Needs:   Kcal:  1650-1850  Protein:  80-100 grams  Fluid:  >1.6 L/day   AmandLarkin Ina RD, LDN RD pager number and weekend/on-call pager number located in [facility]

## 2019-08-10 NOTE — Progress Notes (Signed)
Inpatient Diabetes Program Recommendations  AACE/ADA: New Consensus Statement on Inpatient Glycemic Control  Target Ranges:  Prepandial:   less than 140 mg/dL      Peak postprandial:   less than 180 mg/dL (1-2 hours)      Critically ill patients:  140 - 180 mg/dL   Results for Haley Hernandez, Haley Hernandez (MRN [id number]) as of 08/10/2019 07:47  Ref. Range 08/09/2019 07:47 08/09/2019 11:44 08/09/2019 15:22 08/09/2019 19:21 08/09/2019 23:44 08/10/2019 04:35  Glucose-Capillary Latest Ref Range: 70 - 99 mg/dL 494 (H) 944 (H) 739 (H) 223 (H) 217 (H) 227 (H)    Review of Glycemic Control  Current orders for Inpatient glycemic control: Lantus 14 units daily, Novolog 0-20 units Q4H, Novolog 5 units Q4H for tube feeding coverage; Solumedrol 80 mg Q6H, Jevity @ 60 ml/hr   Inpatient Diabetes Program Recommendations:   Insulin - Basal: If steroids are continued, please consider increasing Lantus to 24 units QAM.  Insulin - Tube Feeding Coverage: If steroids are continued, please consider increasing Novolog tube feeding coverage to Novolog 7 units Q4H.  Thanks, Orlando Penner, RN, MSN, CDE Diabetes Coordinator Inpatient Diabetes Program [phone] (Team Pager from 8am to 5pm)

## 2019-08-10 NOTE — Progress Notes (Signed)
Patient ID: Haley Hernandez, female   DOB: 27-Jul-1960, 59 y.o.   MRN: [id number]  This NP visited patient at the bedside as a follow up for palliative medicine needs and emotional support.  Patient was initially seen on 08/06/2019 by palliative medicine team provider Lamarr Lulas NP.  I placed a call to daughter Haley Hernandez and attempt to schedule a time to meet with family at bedside for continued conversation regarding plan of care.  Daughters can not meet again until Friday.  I have set up a follow-up meeting for Friday 08-12-19 at 2:30 with Cyndy Freeze NP.  I gave daughter contact information and encouraged her to call with questions or concerns questions or concerns in the interim.  No charge  Lorinda Creed NP  Palliative Medicine Team Team Phone # [phone] Pager [phone]

## 2019-08-11 LAB — GLUCOSE, CAPILLARY
Glucose-Capillary: 191 mg/dL — ABNORMAL HIGH (ref 70–99)
Glucose-Capillary: 200 mg/dL — ABNORMAL HIGH (ref 70–99)
Glucose-Capillary: 210 mg/dL — ABNORMAL HIGH (ref 70–99)
Glucose-Capillary: 220 mg/dL — ABNORMAL HIGH (ref 70–99)
Glucose-Capillary: 230 mg/dL — ABNORMAL HIGH (ref 70–99)
Glucose-Capillary: 247 mg/dL — ABNORMAL HIGH (ref 70–99)

## 2019-08-11 LAB — CULTURE, BLOOD (ROUTINE X 2)
Culture: NO GROWTH
Culture: NO GROWTH
Special Requests: ADEQUATE
Special Requests: ADEQUATE

## 2019-08-11 NOTE — Progress Notes (Signed)
PROGRESS NOTE  Haley Hernandez HFW:[id number] DOB: [birth year] DOA: 07/18/2019 PCP: No primary care provider on file.  Brief History   Haley Hernandez a 59 y.o.femalewith past medical history of intracerebral hemorrhage 7 years ago, hypertension presents to the emergency department after family found her unresponsive.  Last known normal 7:45 PM,family found her unresponsive. EMS was called and patient noted to be plegic on the left side and gaze deviation to the right. Blood pressure was 027 systolic.  When patient arrived to [facility] ER,she was immediately taken for stat CT head which demonstrated a large right thalamic hemorrhage with intraventricular extension and early hydrocephalus.She was emergently intubated for airway protection. Labetalol and Cleviprex was initiated for blood pressure control,initial delay as patient needed to beintubated.Neurosurgery was consulted for EVD placement.  Admitted to neurology ICU on 3/15 for ICH status post tracheostomy and PEG Neurology transfer to [facility] on April 3.  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 Both have been discontinued. ENT was consulted as tongue continued to protrude 4-5 cm past the teeth and 2 cm laterally this morning. Their recommendation is steroids and use of a "Bite Block" manufactured out of tongue depressor guaze and tape. I have communicated this to nursing, but they tongue is so swollen, that they have been unable to fit the bite block in her mouth. She is receiving iV solumedrol. The patient was seen by Dr. Erlinda Hong from neurology today. He had recommended to the family that oral surgery be consulted to remove all of the patient's teeth, so that she wouldn't bite down on her tongue. I have discussed this with Dr. Erlinda Hong and the patient's daughter. I feel it would be prudent to continue steroids and allow them to reduce swelling rather than permanently remove teeth for a  temporary problem. I also feel that it would be quite traumatic and difficult to remove teeth in such a crowded environment. They have expressed understanding.  Consultants   ENT  Palliative Care  Neurology  Interventional Radiology  PCCM  Neurosurgery  Procedures   EVD placement  Tracheostomy  Intubation and mechanical ventilation  Antibiotics   Anti-infectives (From admission, onward)   Start     Dose/Rate Route Frequency Ordered Stop   08/03/19 1442  ceFAZolin (ANCEF) 2-4 GM/100ML-% IVPB    Note to Pharmacy: [name]: cabinet override      08/03/19 1442 08/03/19 1458   07/30/19 0930  levofloxacin (LEVAQUIN) IVPB 500 mg     500 mg 100 mL/hr over 60 Minutes Intravenous Every 24 hours 07/30/19 0915 08/03/19 1101   07/23/19 0900  ceFEPIme (MAXIPIME) 2 g in sodium chloride 0.9 % 100 mL IVPB  Status:  Discontinued     2 g 200 mL/hr over 30 Minutes Intravenous Every 8 hours 07/23/19 0854 07/27/19 1503   07/19/19 1100  cefTRIAXone (ROCEPHIN) 2 g in sodium chloride 0.9 % 100 mL IVPB  Status:  Discontinued     2 g 200 mL/hr over 30 Minutes Intravenous Every 24 hours 07/19/19 1052 07/23/19 0842   07/19/19 1100  metroNIDAZOLE (FLAGYL) IVPB 500 mg     500 mg 100 mL/hr over 60 Minutes Intravenous Every 8 hours 07/19/19 1052 07/24/19 0301     Subjective  The patient is somnolent. Tongue appears a little smaller. Lips less swollen.  Objective   Vitals:  Vitals:   08/11/19 0720 08/11/19 1118  BP: (!) 166/93 126/82  Pulse: 86 84  Resp: 17  Temp: 99.3 F (37.4 C) 99.4 F (37.4 C)  SpO2: 93% 94%   Exam:  Constitutional:   The patient is awake. No acute distress. ENMT:   Tongue very large. Protruding beyond lips by 3 cm and laterally by 1 cm on each side. Appears a little less swollen from yesterday. Lips quite a bit less swollen. Respiratory:   No increased work of breathing.  No wheezes, rales, or rhonchi  No tactile fremitus  There are coarse  upper airway sounds. Cardiovascular:   Regular rate and rhythm  No murmurs, ectopy, or gallups.  No lateral PMI. No thrills. Abdomen:   Abdomen is soft, non-tender, non-distended  No hernias, masses, or organomegaly  Normoactive bowel sounds.  Musculoskeletal:   No cyanosis, clubbing, or edema Skin:   No rashes, lesions, ulcers  palpation of skin: no induration or nodules Neurologic:   Patient unable to cooperate with exam. Psychiatric:  Patient unable to cooperate with exam.  I have personally reviewed the following:   Today's Data   Vitals  Micro Data   Urine culture (08/06/2019): Insignificant growth  Blood culture x 2 (08/06/2019): No growth  Scheduled Meds:  carvedilol  25 mg Per Tube BID   chlorhexidine gluconate (MEDLINE KIT)  15 mL Mouth Rinse BID   Chlorhexidine Gluconate Cloth  6 each Topical Daily   chlorthalidone  12.5 mg Oral Daily   famotidine  20 mg Per Tube BID   free water  140 mL Per Tube Q6H   guaiFENesin  10 mL Per Tube BID   insulin aspart  0-20 Units Subcutaneous Q4H   insulin aspart  7 Units Subcutaneous Q4H   insulin glargine  24 Units Subcutaneous Daily   magic mouthwash w/lidocaine  5 mL Oral TID   mouth rinse  15 mL Mouth Rinse 10 times per day   methylPREDNISolone (SOLU-MEDROL) injection  80 mg Intravenous Q6H   sodium chloride flush  10-40 mL Intracatheter Q12H   triamcinolone   Mouth/Throat TID   Continuous Infusions:  feeding supplement (JEVITY 1.2 CAL) 1,000 mL (08/09/19 2044)    Active Problems:   IVH (intraventricular hemorrhage) (HCC)   Acute respiratory failure with hypoxemia (HCC)   Hypokalemia   Hypophosphatemia   Fever   Palliative care by specialist   Goals of care, counseling/discussion   Full code status   Tongue swelling   LOS: 24 days   A & P  Fever work-up (fever x1 on 4/3 am): She was treated for pneumonia from March 16 to March 20 is with Rocephin and metronidazole. She was treated  with cefepime from March 20 is to March 24 when she had EVD. She was treated with Levaquin from March 27 to March 31 for HCAP. Repeat UA/urine culture on April 3 demonstrating only insignificant growth. Blood cultures x 2 collected on that date as well as 3/24, 3/26 have had no growth. Repeat tracheal aspirates from 08/06/2019 have had no growth.  Gram stain has demonstrated only normal respiratory flora. 4/3 CXR demonstrated minimal left basilar atelectasis is noted but improved from the prior study. Family report patient has been having loose stools since started tube feeds, she has a rectal tube in. Only 100 cc of stool output on 08/08/2019.  Angioedema: ENT consulted. I appreciate Dr. Janace Hoard' input. Bite block has been ordered and discussed with nursing to prevent the patient from biting her tongue. She has been started on IV solumedrol. Amlodipine and losartan have been discontinued. Airway intact due to pre-existing tracheostomy.  ENT was consulted as tongue continued to protrude 3 cm past the teeth and 1 cm laterally this morning. Their recommendation is steroids and use of a "Bite Block" manufactured out of tongue depressor guaze and tape. I have communicated this to nursing, but they tongue is so swollen, that they have been unable to fit the bite block in her mouth. She is receiving IV solumedrol. The patient was seen by Dr. Erlinda Hong from neurology today. He had recommended to the family that oral surgery be consulted to remove all of the patient's teeth, so that she wouldn't bite down on her tongue. I have discussed this with Dr. Erlinda Hong and the patient's daughter. I feel it would be prudent to continue steroids and allow them to reduce swelling rather than permanently remove teeth for a temporary problem. I also feel that it would be quite traumatic and difficult to remove teeth in such a crowded environment. They have expressed understanding. I will try ice or cold packs to tongue. I have discussed this with  nursing.  Decreased mentation: Daughter report patient was awake 5 days ago. Per consultants patient with worsening mental status since yesterday. Palliative care is following. Sedating medications have been stopped.  Hemorrhagic stroke from hypertensive urgency emergency s/p external ventricular drainage (present on admission): She has history of ICH 7 years ago. Neurology following, will follow recommendation.  Acute respiratory failure due to ICH: Now status post tracheostomy on March 27/status post PEG placement on March 3. RT has changed out trach. The patient had coarse upper airway sounds that has been addressed by Robinul IV.  Anemia from acute illness: Hemoglobin on presentation was normal. Currently hemoglobin 10.4. Monitor.  Hypertension: The patient presented with hypertension/ urgency emergency. Blood pressure is under fair control on Coreg, hydralazine, and chlorthalidone listed as home medication. Amlodipine and losartan stopped.  Obesity: Body mass index is 32.08 kg/m.  I have seen and examined this patient myself. I have spent 34 minutes in her evaluation and care.  DVT Prophylaxis: SCD's Code Status: full Family Communication: I have discussed the patient in detail with the daughter, [name]. Disposition Plan: The patient came from home. Anticipate discharge to SNF/LTAC. Awaiting PT/OT eval. Barriers to d/c OR conditions which need to be met to effect a safe d/c:Improvement in severe tongue swelling. Need LTAC level of care or skilled nursing facility can take care of trach PEG patient, however patient has no insurance.  Gorgeous Newlun, DO [facility] Direct contact: see [email or web address]  7PM-7AM contact night coverage as above 08/11/2019, 1:49 PM  LOS: 21 days

## 2019-08-12 LAB — CBC WITH DIFFERENTIAL/PLATELET
Abs Immature Granulocytes: 0.1 10*3/uL — ABNORMAL HIGH (ref 0.00–0.07)
Basophils Absolute: 0 10*3/uL (ref 0.0–0.1)
Basophils Relative: 0 %
Eosinophils Absolute: 0 10*3/uL (ref 0.0–0.5)
Eosinophils Relative: 0 %
HCT: 31.2 % — ABNORMAL LOW (ref 36.0–46.0)
Hemoglobin: 9.7 g/dL — ABNORMAL LOW (ref 12.0–15.0)
Immature Granulocytes: 1 %
Lymphocytes Relative: 7 %
Lymphs Abs: 0.8 10*3/uL (ref 0.7–4.0)
MCH: 27.9 pg (ref 26.0–34.0)
MCHC: 31.1 g/dL (ref 30.0–36.0)
MCV: 89.7 fL (ref 80.0–100.0)
Monocytes Absolute: 0.7 10*3/uL (ref 0.1–1.0)
Monocytes Relative: 6 %
Neutro Abs: 9.6 10*3/uL — ABNORMAL HIGH (ref 1.7–7.7)
Neutrophils Relative %: 86 %
Platelets: 361 10*3/uL (ref 150–400)
RBC: 3.48 MIL/uL — ABNORMAL LOW (ref 3.87–5.11)
RDW: 13.5 % (ref 11.5–15.5)
WBC: 11.1 10*3/uL — ABNORMAL HIGH (ref 4.0–10.5)
nRBC: 0 % (ref 0.0–0.2)

## 2019-08-12 LAB — BASIC METABOLIC PANEL
Anion gap: 8 (ref 5–15)
BUN: 49 mg/dL — ABNORMAL HIGH (ref 6–20)
CO2: 35 mmol/L — ABNORMAL HIGH (ref 22–32)
Calcium: 11.8 mg/dL — ABNORMAL HIGH (ref 8.9–10.3)
Chloride: 95 mmol/L — ABNORMAL LOW (ref 98–111)
Creatinine, Ser: 0.72 mg/dL (ref 0.44–1.00)
GFR calc Af Amer: >60 mL/min (ref >60)
GFR calc non Af Amer: >60 mL/min (ref >60)
Glucose, Bld: 200 mg/dL — ABNORMAL HIGH (ref 70–99)
Potassium: 3.9 mmol/L (ref 3.5–5.1)
Sodium: 138 mmol/L (ref 135–145)

## 2019-08-12 LAB — GLUCOSE, CAPILLARY
Glucose-Capillary: 182 mg/dL — ABNORMAL HIGH (ref 70–99)
Glucose-Capillary: 192 mg/dL — ABNORMAL HIGH (ref 70–99)
Glucose-Capillary: 225 mg/dL — ABNORMAL HIGH (ref 70–99)
Glucose-Capillary: 218 mg/dL — ABNORMAL HIGH (ref 70–99)
Glucose-Capillary: 224 mg/dL — ABNORMAL HIGH (ref 70–99)
Glucose-Capillary: 217 mg/dL — ABNORMAL HIGH (ref 70–99)

## 2019-08-12 MED ORDER — ALTEPLASE 2 MG IJ SOLR
2.0000 mg | Freq: Once | INTRAMUSCULAR | Status: AC
  Administered 2019-08-12: 2 mg
  Filled 2019-08-12: qty 2

## 2019-08-12 MED ORDER — DIPHENHYDRAMINE HCL 50 MG/ML IJ SOLN
25.0000 mg | Freq: Four times a day (QID) | INTRAMUSCULAR | Status: DC
  Administered 2019-08-12 – 2019-08-15 (×12): 25 mg via INTRAVENOUS
  Filled 2019-08-12 (×12): qty 1

## 2019-08-12 MED ORDER — FAMOTIDINE IN NACL 20-0.9 MG/50ML-% IV SOLN
20.0000 mg | Freq: Two times a day (BID) | INTRAVENOUS | Status: DC
  Administered 2019-08-12 – 2019-08-18 (×12): 20 mg via INTRAVENOUS
  Filled 2019-08-12 (×14): qty 50

## 2019-08-12 NOTE — Progress Notes (Signed)
Thick copious yellow/tan secretions.

## 2019-08-12 NOTE — Progress Notes (Signed)
PROGRESS NOTE  Haley Hernandez ION:[id number] DOB: Jun 02, 1960 DOA: 07/18/2019 PCP: No primary care provider on file.  Brief History   Haley Hernandez a 59 y.o.femalewith past medical history of intracerebral hemorrhage 7 years ago, hypertension presents to the emergency department after family found her unresponsive.  Last known normal 7:45 PM,family found her unresponsive. EMS was called and patient noted to be plegic on the left side and gaze deviation to the right. Blood pressure was 244 systolic on presentation.  When patient arrived to [facility] ER,she was immediately taken for stat CT head which demonstrated a large right thalamic hemorrhage with intraventricular extension and early hydrocephalus.She was emergently intubated for airway protection. Labetalol and Cleviprex was initiated for blood pressure control,initial delay as patient needed to beintubated.Neurosurgery was consulted for EVD placement.  Admitted to neurology ICU on 3/15 for ICH status post tracheostomy and PEG Neurology transfer to [facility] on April 3.  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 Both have been discontinued. ENT was consulted as tongue continued to protrude 4-5 cm past the teeth and 2 cm laterally this morning. Their recommendation is steroids and use of a "Bite Block" manufactured out of tongue depressor guaze and tape. I have communicated this to nursing, but they tongue is so swollen, that they have been unable to fit the bite block in her mouth. She is receiving iV solumedrol. The patient was seen by Dr. Erlinda Hong from neurology today. He had recommended to the family that oral surgery be consulted to remove all of the patient's teeth, so that she wouldn't bite down on her tongue. I have discussed this with Dr. Erlinda Hong and the patient's daughter. I feel it would be prudent to continue steroids and allow them to reduce swelling rather than permanently  remove teeth for a temporary problem. I also feel that it would be quite traumatic and difficult to remove teeth in such a crowded environment. They have expressed understanding. The swelling is slowly improving with IV steroids, ice, benadryl and Pepsid.  Consultants  . ENT . Palliative Care . Neurology . Interventional Radiology . PCCM . Neurosurgery  Procedures  . EVD placement . Tracheostomy . Intubation and mechanical ventilation  Antibiotics   Anti-infectives (From admission, onward)   Start     Dose/Rate Route Frequency Ordered Stop   08/03/19 1442  ceFAZolin (ANCEF) 2-4 GM/100ML-% IVPB    Note to Pharmacy: [name]: cabinet override      08/03/19 1442 08/03/19 1458   07/30/19 0930  levofloxacin (LEVAQUIN) IVPB 500 mg     500 mg 100 mL/hr over 60 Minutes Intravenous Every 24 hours 07/30/19 0915 08/03/19 1101   07/23/19 0900  ceFEPIme (MAXIPIME) 2 g in sodium chloride 0.9 % 100 mL IVPB  Status:  Discontinued     2 g 200 mL/hr over 30 Minutes Intravenous Every 8 hours 07/23/19 0854 07/27/19 1503   07/19/19 1100  cefTRIAXone (ROCEPHIN) 2 g in sodium chloride 0.9 % 100 mL IVPB  Status:  Discontinued     2 g 200 mL/hr over 30 Minutes Intravenous Every 24 hours 07/19/19 1052 07/23/19 0842   07/19/19 1100  metroNIDAZOLE (FLAGYL) IVPB 500 mg     500 mg 100 mL/hr over 60 Minutes Intravenous Every 8 hours 07/19/19 1052 07/24/19 0301     Subjective  The patient is somnolent. Tongue appears a little smaller again today. Lips less swollen.  Objective   Vitals:  Vitals:   08/12/19 1225  08/12/19 1300  BP: (!) 205/123 (!) 168/99  Pulse: 79   Resp: 20   Temp: 98.4 F (36.9 C)   SpO2: 95%    Exam:  Constitutional:  . The patient is awake. No acute distress. ENMT:  . Tongue very large. Protruding beyond lips by 3 cm and laterally by 1 cm on each side. Appears a little less swollen again when compared to the way it looked yesterday. Lips quite a bit less swollen  again. Add Benadryl and pepcid. Respiratory:  . No increased work of breathing. . No wheezes, rales, or rhonchi . No tactile fremitus . There are coarse upper airway sounds. Cardiovascular:  . Regular rate and rhythm . No murmurs, ectopy, or gallups. . No lateral PMI. No thrills. Abdomen:  . Abdomen is soft, non-tender, non-distended . No hernias, masses, or organomegaly . Normoactive bowel sounds.  Musculoskeletal:  . No cyanosis, clubbing, or edema Skin:  . No rashes, lesions, ulcers . palpation of skin: no induration or nodules Neurologic:  . Patient unable to cooperate with exam. Psychiatric:  Patient unable to cooperate with exam.  I have personally reviewed the following:   Today's Data  . Vitals, CBC, BMP, Glucoses  Micro Data  . Urine culture (08/06/2019): Insignificant growth . Blood culture x 2 (08/06/2019): No growth  Scheduled Meds: . carvedilol  25 mg Per Tube BID  . chlorhexidine gluconate (MEDLINE KIT)  15 mL Mouth Rinse BID  . Chlorhexidine Gluconate Cloth  6 each Topical Daily  . chlorthalidone  12.5 mg Oral Daily  . diphenhydrAMINE  25 mg Intravenous Q6H  . free water  140 mL Per Tube Q6H  . guaiFENesin  10 mL Per Tube BID  . insulin aspart  0-20 Units Subcutaneous Q4H  . insulin aspart  7 Units Subcutaneous Q4H  . insulin glargine  24 Units Subcutaneous Daily  . magic mouthwash w/lidocaine  5 mL Oral TID  . mouth rinse  15 mL Mouth Rinse 10 times per day  . methylPREDNISolone (SOLU-MEDROL) injection  80 mg Intravenous Q6H  . sodium chloride flush  10-40 mL Intracatheter Q12H  . triamcinolone   Mouth/Throat TID   Continuous Infusions: . famotidine (PEPCID) IV    . feeding supplement (JEVITY 1.2 CAL) 1,000 mL (08/12/19 0604)    Active Problems:   IVH (intraventricular hemorrhage) (HCC)   Acute respiratory failure with hypoxemia (HCC)   Hypokalemia   Hypophosphatemia   Fever   Palliative care by specialist   Goals of care,  counseling/discussion   Full code status   Tongue swelling   LOS: 25 days   A & P  Fever work-up (fever x1 on 4/3 am): She was treated for pneumonia from March 16 to March 20 is with Rocephin and metronidazole. She was treated with cefepime from March 20 is to March 24 when she had EVD. She was treated with Levaquin from March 27 to March 31 for HCAP. Repeat UA/urine culture on April 3 demonstrating only insignificant growth. Blood cultures x 2 collected on that date as well as 3/24, 3/26 have had no growth. Repeat tracheal aspirates from 08/06/2019 have had no growth.  Gram stain has demonstrated only normal respiratory flora. 4/3 CXR demonstrated minimal left basilar atelectasis is noted but improved from the prior study. Family report patient has been having loose stools since started tube feeds, she has a rectal tube in. Only 100 cc of stool output on 08/08/2019.  Angioedema:improved now 2 days in a row. ENT consulted.  I appreciate Dr. Janace Hoard' input. Bite block has been ordered and discussed with nursing to prevent the patient from biting her tongue. She has been started on IV solumedrol. Amlodipine and losartan have been discontinued. Airway intact due to pre-existing tracheostomy. ENT was consulted as tongue continued to protrude 3 cm past the teeth and 1 cm laterally this morning. Their recommendation is steroids and use of a "Bite Block" manufactured out of tongue depressor guaze and tape. I have communicated this to nursing, but they tongue is so swollen, that they have been unable to fit the bite block in her mouth. She is receiving IV solumedrol. The patient was seen by Dr. Erlinda Hong from neurology today. He had recommended to the family that oral surgery be consulted to remove all of the patient's teeth, so that she wouldn't bite down on her tongue. I have discussed this with Dr. Erlinda Hong and the patient's daughter. I feel it would be prudent to continue steroids and allow them to reduce swelling rather than  permanently remove teeth for a temporary problem. I also feel that it would be quite traumatic and difficult to remove teeth in such a crowded environment. They have expressed understanding. I will try ice or cold packs to tongue. I have also added Benadryl and pepcid.   Decreased mentation: Daughter report patient was awake 5 days ago. Per consultants patient with worsening mental status since yesterday. Palliative care is following. They will meet with the family this morning. Sedating medications have been stopped.  Hemorrhagic stroke from hypertensive urgency emergency s/p external ventricular drainage (present on admission): She has history of ICH 7 years ago. Neurology following, will follow recommendation.  Acute respiratory failure due to ICH: Now status post tracheostomy on March 27/status post PEG placement on March 3. RT has changed out trach. The patient had coarse upper airway sounds that has been addressed by Robinul IV.  Anemia from acute illness: Hemoglobin on presentation was normal. Currently hemoglobin 9.7. Monitor.  Hypertension: The patient presented with hypertension/ urgency emergency. Blood pressure is under fair control on Coreg, hydralazine, and chlorthalidone listed as home medication. Amlodipine and losartan stopped.  Obesity: Body mass index is 32.25 kg/m.  I have seen and examined this patient myself. I have spent 34 minutes in her evaluation and care.  DVT Prophylaxis: SCD's Code Status: full Family Communication: I have discussed the patient in detail with the daughter, [name]. Disposition Plan: The patient came from home. Anticipate discharge to SNF/LTAC. Awaiting PT/OT eval. Barriers to d/c OR conditions which need to be met to effect a safe d/c:Improvement in severe tongue swelling. Need LTAC level of care or skilled nursing facility can take care of trach PEG patient, however patient has no insurance.  Wilmore Holsomback, DO [facility] Direct  contact: see [email or web address]  7PM-7AM contact night coverage as above 08/12/2019, 1:52 PM  LOS: 21 days

## 2019-08-12 NOTE — TOC Initial Note (Signed)
Transition of Care [facility]) - Initial/Assessment Note    Patient Details  Name: Haley Hernandez MRN: [id number] Date of Birth: May 28, 1960  Transition of Care [facility]) CM/SW Contact:    Geralynn Ochs, LCSW Phone Number: 08/12/2019, 4:55 PM  Clinical Narrative:    CSW alerted by palliative NP that family had questions about disposition options. CSW met with both daughters at bedside to answer their questions. CSW explained that patient is not currently appropriate for rehabilitation as she is not participating in therapies enough for an authorization with insurance, but could see if patient would qualify for [facility] with her medical issues. Daughters indicated that [facility] is a no, but would be agreeable with [facility], if possible. CSW sent referral, but they are unable to offer for the patient at this time due to diminished participation. Per palliative, family is hopeful for improvement and want to continue care. CSW to continue to follow for disposition options if patient improves and participates more actively in care.      Barriers to Discharge: Continued Medical Work up, Ship broker, Inadequate or no insurance   Patient Goals and CMS Choice Patient states their goals for this hospitalization and ongoing recovery are:: patient unable to state goals due to disorientation CMS Medicare.gov Compare Post Acute Care list provided to:: Patient Represenative (must comment) Choice offered to / list presented to : Adult Children  Expected Discharge Plan and Services       Post Acute Care Choice: Long Term Acute Care (LTAC), [facility] Living arrangements for the past 2 months: Single Family Home                                      Prior Living Arrangements/Services Living arrangements for the past 2 months: Single Family Home Lives with:: Self Patient language and need for interpreter reviewed:: No Do you feel safe going back to the place where you live?:  Yes      Need for Family Participation in Patient Care: Yes (Comment) Care giver support system in place?: No (comment)   Criminal Activity/Legal Involvement Pertinent to Current Situation/Hospitalization: No - Comment as needed  Activities of Daily Living Home Assistive Devices/Equipment: Eyeglasses, Blood pressure cuff ADL Screening (condition at time of admission) Patient's cognitive ability adequate to safely complete daily activities?: No Is the patient deaf or have difficulty hearing?: Yes Does the patient have difficulty seeing, even when wearing glasses/contacts?: Yes Does the patient have difficulty concentrating, remembering, or making decisions?: Yes Patient able to express need for assistance with ADLs?: No Does the patient have difficulty dressing or bathing?: Yes Independently performs ADLs?: No Communication: Dependent Is this a change from baseline?: Change from baseline, expected to last >3 days Dressing (OT): Dependent Is this a change from baseline?: Change from baseline, expected to last >3 days Grooming: Dependent Is this a change from baseline?: Change from baseline, expected to last >3 days Feeding: Dependent Is this a change from baseline?: Change from baseline, expected to last >3 days Bathing: Dependent Is this a change from baseline?: Change from baseline, expected to last >3 days Toileting: Dependent Is this a change from baseline?: Change from baseline, expected to last >3days In/Out Bed: Dependent Is this a change from baseline?: Change from baseline, expected to last >3 days Walks in Home: Dependent Is this a change from baseline?: Change from baseline, expected to last >3 days Does the patient have difficulty  walking or climbing stairs?: Yes Weakness of Legs: Both Weakness of Arms/Hands: Both  Permission Sought/Granted Permission sought to share information with : Facility Sport and exercise psychologist, Family Supports Permission granted to share  information with : Yes, Verbal Permission Granted  Share Information with NAME: Adrian Prince  Permission granted to share info w AGENCY: LTACH, SNF  Permission granted to share info w Relationship: Daughters     Emotional Assessment Appearance:: Appears stated age Attitude/Demeanor/Rapport: Unable to Assess Affect (typically observed): Unable to Assess   Alcohol / Substance Use: Not Applicable Psych Involvement: No (comment)  Admission diagnosis:  ICH (intracerebral hemorrhage) ([place]) [I61.9] Patient Active Problem List   Diagnosis Date Noted  . Tongue swelling   . Palliative care by specialist   . Goals of care, counseling/discussion   . Full code status   . Acute respiratory failure with hypoxemia ([place]) 07/23/2019  . Hypokalemia 07/23/2019  . Hypophosphatemia 07/23/2019  . Fever 07/23/2019  . IVH (intraventricular hemorrhage) ([place]) 07/18/2019  . OBSTRUCTIVE SLEEP APNEA 06/27/2008  . COPD 06/27/2008  . COUGH DUE TO ACE INHIBITORS 03/03/2008  . HYPOXEMIA 03/03/2008   PCP:  No primary care provider on file. Pharmacy:   [place], [address] [place] [address] Phone: [phone] Fax: [phone]  [place], [place] [address] [place] [address] Phone: [phone] Fax: [phone]  [place], [place] - [address] [place] [address] Phone: [phone] Fax: [phone]     Social Determinants of Health ([place]) Interventions    Readmission Risk Interventions No flowsheet data found.

## 2019-08-12 NOTE — Progress Notes (Signed)
Physical Therapy Treatment Patient Details Name: Haley Hernandez MRN: [id number] DOB: Dec 30, 1960 Today's Date: 08/12/2019    History of Present Illness 59 y.o. female with past medical history of intracerebral hemorrhage 7 years ago, hypertension presents to the emergency department after family found her unresponsive with L hemipleiga and R gaze deviation. 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 Pt intubated and with EVD placed on 3/15. EVD removed 3/24. tracheostomy 3/27; EEG 3/29 moderate diffuse encephalopathy with no seizures (multiple events of RUE twitching occurred during EEG without EEG changes)    PT Comments    Pt seen for PT treatment. Pt opening eyes minimally and briefly with verbal and tactile cues. Pt with non purposeful movement R UE and no active movement R LE and L side. Daughter present throughout session. PT will continue to follow.    Follow Up Recommendations  SNF;LTACH;Supervision/Assistance - 24 hour     Equipment Recommendations  Other (comment)(TBD)    Recommendations for Other Services       Precautions / Restrictions Precautions Precautions: Fall Precaution Comments: SBP<160, HOB>30 degrees Restrictions Weight Bearing Restrictions: No    Mobility  Bed Mobility               General bed mobility comments: total A +2 for positioning   Transfers                 General transfer comment: not appropriate at this time  Ambulation/Gait                 Stairs             Wheelchair Mobility    Modified Rankin (Stroke Patients Only) Modified Rankin (Stroke Patients Only) Pre-Morbid Rankin Score: (unable to determine at this time) Modified Rankin: Severe disability     Balance                                            Cognition Arousal/Alertness: Lethargic Behavior During Therapy: Flat affect Overall Cognitive Status: Difficult to assess                                  General Comments: pt opened eyes minimally during session with verbal and tactile cues; pt not following commands; R UE movement during session non purposeful      Exercises Other Exercises Other Exercises: PROM for all extremities; no active movement on L side or R LE; some resistance to ROM of R UE; pt attempting to reach for mouth     General Comments        Pertinent Vitals/Pain Pain Assessment: Faces Pain Location: tongue Pain Descriptors / Indicators: Grimacing;Discomfort Pain Intervention(s): Monitored during session;Other (comment)(pt reaching toward mouth )    Home Living                      Prior Function            PT Goals (current goals can now be found in the care plan section) Progress towards PT goals: Not progressing toward goals - comment    Frequency    Min 2X/week(decr by Veda Canning, PT )      PT Plan Current plan remains appropriate    Co-evaluation  AM-PAC PT "6 Clicks" Mobility   Outcome Measure  Help needed turning from your back to your side while in a flat bed without using bedrails?: Total Help needed moving from lying on your back to sitting on the side of a flat bed without using bedrails?: Total Help needed moving to and from a bed to a chair (including a wheelchair)?: Total Help needed standing up from a chair using your arms (e.g., wheelchair or bedside chair)?: Total Help needed to walk in hospital room?: Total Help needed climbing 3-5 steps with a railing? : Total 6 Click Score: 6    End of Session Equipment Utilized During Treatment: Oxygen Activity Tolerance: Patient tolerated treatment well Patient left: in bed;with call bell/phone within reach;with bed alarm set;with SCD's reapplied;with family/visitor present Nurse Communication: Need for lift equipment PT Visit Diagnosis: Other symptoms and signs involving the nervous system (R29.898);Muscle  weakness (generalized) (M62.81);Hemiplegia and hemiparesis Hemiplegia - Right/Left: Left Hemiplegia - caused by: Nontraumatic intracerebral hemorrhage     Time: 1130-1156 PT Time Calculation (min) (ACUTE ONLY): 26 min  Charges:  $Therapeutic Activity: 23-37 mins                     [name], PTA Acute Rehabilitation Services Pager: [phone] Office: [phone]     Darliss Cheney 08/12/2019, 1:58 PM

## 2019-08-12 NOTE — Progress Notes (Signed)
   Palliative Medicine Inpatient Follow Up Note   HPI: 59 y.o. female  with past medical history of HTN, sleep apnea, obesity admitted on 07/18/2019 s/p acute intraparenchymal hemorrhage in the right thalamus and basal ganglia with intraventricular extensionwith mild hydrocephalus.  Palliative care was consulted to establish goals of care.   4/5 - Met with patients daughter on 4/3, at that time they wished to continue to full code full scope of care. ENT will be consulted per chart review for ongoing tongue swelling.  Angioedema is improving on current treatments.   Today's Discussion (08/12/2019): Chart reviewed. Met with patients daughters [name] and [name]. They endorsed that Laretha has had a better week. She is able to open her eyes and does not appear as somnolent. He angioedema is improving on steroids. Both daughters remain hopeful for improvements and would like to continue the current course of care.   Discussed the importance of continued conversation with family and their  medical providers regarding overall plan of care and treatment options, ensuring decisions are within the context of the patients values and GOCs.  Questions and concerns addressed   Vital Signs Vitals:   08/12/19 1300 08/12/19 1644  BP: (!) 168/99 (!) 158/107  Pulse:  87  Resp:  20  Temp:  98.8 F (37.1 C)  SpO2:  95%    Intake/Output Summary (Last 24 hours) at 08/12/2019 1712 Last data filed at 08/12/2019 1651 Gross per 24 hour  Intake 680 ml  Output 1200 ml  Net -520 ml   Last Weight  Most recent update: 08/12/2019  4:21 AM   Weight  74.9 kg (165 lb 2 oz)          Gen:  Awake AA F  HEENT: Swollen tongue extending beyond lips dry mucous membranes CV: Regular rate and rhythm, no murmurs rubs or gallops PULM: On Tracheostomy collar, rhonchi throughout notable copious secretions ABD: soft/nontender/g-tube in place EXT: Generalized edema  Neuro: Awake able to track  SUMMARY OF RECOMMENDATIONS    Full Code  Full Scope for now  Ongoing goals of care conversations, patient has had some small improvements this weeks  Chaplain Consult  Time Spent: 25 Greater than 50% of the time was spent in counseling and coordination of care ______________________________________________________________________________________ [place] Team Team Cell Phone: [phone] Please utilize secure chat with additional questions, if there is no response within 30 minutes please call the above phone number  Palliative Medicine Team providers are available by phone from 7am to 7pm daily and can be reached through the team cell phone.  Should this patient require assistance outside of these hours, please call the patient's attending physician.

## 2019-08-13 LAB — GLUCOSE, CAPILLARY
Glucose-Capillary: 191 mg/dL — ABNORMAL HIGH (ref 70–99)
Glucose-Capillary: 205 mg/dL — ABNORMAL HIGH (ref 70–99)
Glucose-Capillary: 212 mg/dL — ABNORMAL HIGH (ref 70–99)
Glucose-Capillary: 214 mg/dL — ABNORMAL HIGH (ref 70–99)
Glucose-Capillary: 220 mg/dL — ABNORMAL HIGH (ref 70–99)
Glucose-Capillary: 226 mg/dL — ABNORMAL HIGH (ref 70–99)
Glucose-Capillary: 256 mg/dL — ABNORMAL HIGH (ref 70–99)

## 2019-08-13 LAB — TROPONIN I (HIGH SENSITIVITY)
Troponin I (High Sensitivity): 17 ng/L (ref <18)
Troponin I (High Sensitivity): 18 ng/L — ABNORMAL HIGH (ref <18)

## 2019-08-13 NOTE — Progress Notes (Signed)
Notified Swayze, MD patient is showing ST elevation on telemetry.

## 2019-08-13 NOTE — Progress Notes (Signed)
Trach care done

## 2019-08-13 NOTE — Progress Notes (Signed)
RT note-Trach care done at this time, family in room wanting to learn. Time spent doing some education, patient repositioned, continue to monitor.

## 2019-08-13 NOTE — Progress Notes (Signed)
PROGRESS NOTE  Haley Hernandez JOI:[id number] DOB: [birth year] DOA: 07/18/2019 PCP: No primary care provider on file.  Brief History   Haley Hernandez a 59 y.o.femalewith past medical history of intracerebral hemorrhage 7 years ago, hypertension presents to the emergency department after family found her unresponsive.  Last known normal 7:45 PM,family found her unresponsive. EMS was called and patient noted to be plegic on the left side and gaze deviation to the right. Blood pressure was 470 systolic on presentation.  When patient arrived to [facility] ER,she was immediately taken for stat CT head which demonstrated a large right thalamic hemorrhage with intraventricular extension and early hydrocephalus.She was emergently intubated for airway protection. Labetalol and Cleviprex was initiated for blood pressure control,initial delay as patient needed to beintubated.Neurosurgery was consulted for EVD placement.  Admitted to neurology ICU on 3/15 for ICH status post tracheostomy and PEG Neurology transfer to [facility] on April 3.  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 Both have been discontinued. ENT was consulted as tongue continued to protrude 4-5 cm past the teeth and 2 cm laterally this morning. Their recommendation is steroids and use of a "Bite Block" manufactured out of tongue depressor guaze and tape. I have communicated this to nursing, but they tongue is so swollen, that they have been unable to fit the bite block in her mouth. She is receiving iV solumedrol. The patient was seen by Dr. Erlinda Hernandez from neurology today. He had recommended to the family that oral surgery be consulted to remove all of the patient's teeth, so that she wouldn't bite down on her tongue. I have discussed this with Dr. Erlinda Hernandez and the patient's daughter. I feel it would be prudent to continue steroids and allow them to reduce swelling rather than permanently  remove teeth for a temporary problem. I also feel that it would be quite traumatic and difficult to remove teeth in such a crowded environment. They have expressed understanding. The swelling is slowly improving with IV steroids, ice, benadryl and Pepsid.  On 08/11/2019 and 08/13/2019 the patients telemetry appeared to display ST segment elevation. Follow up EKG on 08/11/2019 demonstrated a repolarization abnormality. On 08/13/2019 the patient's EKG failed to demonstrate the ST segment elevation and troponins were negative.   Consultants  . ENT . Palliative Care . Neurology . Interventional Radiology . PCCM . Neurosurgery  Procedures  . EVD placement . Tracheostomy . Intubation and mechanical ventilation  Antibiotics   Anti-infectives (From admission, onward)   Start     Dose/Rate Route Frequency Ordered Stop   08/03/19 1442  ceFAZolin (ANCEF) 2-4 GM/100ML-% IVPB    Note to Pharmacy: [name]: cabinet override      08/03/19 1442 08/03/19 1458   07/30/19 0930  levofloxacin (LEVAQUIN) IVPB 500 mg     500 mg 100 mL/hr over 60 Minutes Intravenous Every 24 hours 07/30/19 0915 08/03/19 1101   07/23/19 0900  ceFEPIme (MAXIPIME) 2 g in sodium chloride 0.9 % 100 mL IVPB  Status:  Discontinued     2 g 200 mL/hr over 30 Minutes Intravenous Every 8 hours 07/23/19 0854 07/27/19 1503   07/19/19 1100  cefTRIAXone (ROCEPHIN) 2 g in sodium chloride 0.9 % 100 mL IVPB  Status:  Discontinued     2 g 200 mL/hr over 30 Minutes Intravenous Every 24 hours 07/19/19 1052 07/23/19 0842   07/19/19 1100  metroNIDAZOLE (FLAGYL) IVPB 500 mg     500 mg 100 mL/hr over 60  Minutes Intravenous Every 8 hours 07/19/19 1052 07/24/19 0301     Subjective  The patient is somnolent. Tongue appears a little smaller again today. Lips less swollen.  Objective   Vitals:  Vitals:   08/13/19 1139 08/13/19 1207  BP: (!) 178/109   Pulse: 76 78  Resp: 19 (!) 21  Temp: 98.4 F (36.9 C)   SpO2: 96%     Exam:  Constitutional:  . The patient is awake. No acute distress. ENMT:  . Tongue large, but less so than previous. Protruding beyond lips by 2-33 cm and laterally by 1 cm on each side. Appears a little less swollen again when compared to the way it looked yesterday. Lips quite a bit less swollen again. Add Benadryl and pepcid. Respiratory:  . No increased work of breathing. . No wheezes, rales, or rhonchi . No tactile fremitus . There are coarse upper airway sounds. Cardiovascular:  . Regular rate and rhythm . No murmurs, ectopy, or gallups. . No lateral PMI. No thrills. Abdomen:  . Abdomen is soft, non-tender, non-distended . No hernias, masses, or organomegaly . Normoactive bowel sounds.  Musculoskeletal:  . No cyanosis, clubbing, or edema Skin:  . No rashes, lesions, ulcers . palpation of skin: no induration or nodules Neurologic:  . Patient unable to cooperate with exam. Psychiatric:  Patient unable to cooperate with exam.  I have personally reviewed the following:   Today's Data  . Vitals, CBC, BMP, Glucoses  Micro Data  . Urine culture (08/06/2019): Insignificant growth . Blood culture x 2 (08/06/2019): No growth  Scheduled Meds: . carvedilol  25 mg Per Tube BID  . chlorhexidine gluconate (MEDLINE KIT)  15 mL Mouth Rinse BID  . Chlorhexidine Gluconate Cloth  6 each Topical Daily  . chlorthalidone  12.5 mg Oral Daily  . diphenhydrAMINE  25 mg Intravenous Q6H  . free water  140 mL Per Tube Q6H  . guaiFENesin  10 mL Per Tube BID  . insulin aspart  0-20 Units Subcutaneous Q4H  . insulin aspart  7 Units Subcutaneous Q4H  . insulin glargine  24 Units Subcutaneous Daily  . magic mouthwash w/lidocaine  5 mL Oral TID  . mouth rinse  15 mL Mouth Rinse 10 times per day  . methylPREDNISolone (SOLU-MEDROL) injection  80 mg Intravenous Q6H  . sodium chloride flush  10-40 mL Intracatheter Q12H  . triamcinolone   Mouth/Throat TID   Continuous Infusions: . famotidine  (PEPCID) IV 20 mg (08/13/19 0852)  . feeding supplement (JEVITY 1.2 CAL) 1,000 mL (08/13/19 0128)    Active Problems:   IVH (intraventricular hemorrhage) (H[facility])   Acute respiratory failure with hypoxemia (H[facility])   Hypokalemia   Hypophosphatemia   Fever   Palliative care by specialist   Goals of care, counseling/discussion   Full code status   Tongue swelling   LOS: 26 days   A & P  Fever work-up (fever x1 on 4/3 am): She was treated for pneumonia from March 16 to March 20 is with Rocephin and metronidazole. She was treated with cefepime from March 20 is to March 24 when she had EVD. She was treated with Levaquin from March 27 to March 31 for HCAP. Repeat UA/urine culture on April 3 demonstrating only insignificant growth. Blood cultures x 2 collected on that date as well as 3/24, 3/26 have had no growth. Repeat tracheal aspirates from 08/06/2019 have had no growth.  Gram stain has demonstrated only normal respiratory flora. 4/3 CXR demonstrated minimal left  basilar atelectasis is noted but improved from the prior study. Family report patient has been having loose stools since started tube feeds, she has a rectal tube in. Only 100 cc of stool output on 08/08/2019.  Angioedema:improved now 2 days in a row. ENT consulted. I appreciate Dr. Janace Hoard' input. Bite block has been ordered and discussed with nursing to prevent the patient from biting her tongue. She has been started on IV solumedrol. Amlodipine and losartan have been discontinued. Airway intact due to pre-existing tracheostomy. ENT was consulted as tongue continued to protrude 3 cm past the teeth and 1 cm laterally this morning. Their recommendation is steroids and use of a "Bite Block" manufactured out of tongue depressor guaze and tape. I have communicated this to nursing, but they tongue is so swollen, that they have been unable to fit the bite block in her mouth. She is receiving IV solumedrol. The patient was seen by Dr. Erlinda Hernandez from neurology  today. He had recommended to the family that oral surgery be consulted to remove all of the patient's teeth, so that she wouldn't bite down on her tongue. I have discussed this with Dr. Erlinda Hernandez and the patient's daughter. I feel it would be prudent to continue steroids and allow them to reduce swelling rather than permanently remove teeth for a temporary problem. I also feel that it would be quite traumatic and difficult to remove teeth in such a crowded environment. They have expressed understanding. I will try ice or cold packs to tongue. I have also added Benadryl and pepcid.   Decreased mentation: Daughter report patient was awake 5 days ago. Per consultants patient with worsening mental status since yesterday. Palliative care is following. They will meet with the family this morning. Sedating medications have been stopped.  Hemorrhagic stroke from hypertensive urgency emergency s/p external ventricular drainage (present on admission): She has history of ICH 7 years ago. Neurology following, will follow recommendation.  Acute respiratory failure due to ICH: Now status post tracheostomy on March 27/status post PEG placement on March 3. RT has changed out trach. The patient had coarse upper airway sounds that has been addressed by Robinul IV.  Anemia from acute illness: Hemoglobin on presentation was normal. Currently hemoglobin 9.7. Monitor.  Hypertension: The patient presented with hypertension/ urgency emergency. Blood pressure is under fair control on Coreg, hydralazine, and chlorthalidone listed as home medication. Amlodipine and losartan stopped.  Abnormal telemetry: On 08/11/2019 and 08/13/2019 the patients telemetry appeared to display ST segment elevation. Follow up EKG on 08/11/2019 demonstrated a repolarization abnormality. On 08/13/2019 the patient's EKG failed to demonstrate the ST segment elevation and troponins were negative.   Obesity: Body mass index is 32.25 kg/m.  I have seen and  examined this patient myself. I have spent 34 minutes in her evaluation and care.  DVT Prophylaxis: SCD's Code Status: full Family Communication: I have discussed the patient in detail with the daughter, [name]. Disposition Plan: The patient came from home. Anticipate discharge to SNF/LTAC. Awaiting PT/OT eval. Barriers to d/c OR conditions which need to be met to effect a safe d/c:Improvement in severe tongue swelling. Need LTAC level of care or skilled nursing facility can take care of trach PEG patient, however patient has no insurance.  Brailen Macneal, DO [facility] Direct contact: see [email or web address]  7PM-7AM contact night coverage as above 08/13/2019, 3:38 PM  LOS: 21 days

## 2019-08-14 LAB — GLUCOSE, CAPILLARY
Glucose-Capillary: 213 mg/dL — ABNORMAL HIGH (ref 70–99)
Glucose-Capillary: 255 mg/dL — ABNORMAL HIGH (ref 70–99)
Glucose-Capillary: 241 mg/dL — ABNORMAL HIGH (ref 70–99)
Glucose-Capillary: 197 mg/dL — ABNORMAL HIGH (ref 70–99)
Glucose-Capillary: 206 mg/dL — ABNORMAL HIGH (ref 70–99)

## 2019-08-14 MED ORDER — CLONIDINE HCL 0.1 MG PO TABS
0.2000 mg | ORAL_TABLET | Freq: Once | ORAL | Status: AC
  Administered 2019-08-14: 0.2 mg
  Filled 2019-08-14: qty 2

## 2019-08-14 MED ORDER — HYDRALAZINE HCL 50 MG PO TABS: 50.0000 mg | ORAL_TABLET | Freq: Three times a day (TID) | ORAL | Status: DC

## 2019-08-14 MED ORDER — CLONIDINE HCL 0.1 MG PO TABS: 0.2000 mg | ORAL_TABLET | Freq: Once | ORAL | Status: DC

## 2019-08-14 MED ORDER — HYDRALAZINE HCL 50 MG PO TABS
50.0000 mg | ORAL_TABLET | Freq: Three times a day (TID) | ORAL | Status: DC
  Administered 2019-08-14 – 2019-08-15 (×2): 50 mg
  Filled 2019-08-14 (×2): qty 1

## 2019-08-14 MED ORDER — HYDRALAZINE HCL 25 MG PO TABS
25.0000 mg | ORAL_TABLET | Freq: Three times a day (TID) | ORAL | Status: DC
  Administered 2019-08-14: 25 mg via ORAL
  Filled 2019-08-14: qty 1

## 2019-08-14 NOTE — Plan of Care (Signed)
Patient stable, discussed POC with patient and daughter, agreeable with plan, denies question/concerns at this time.  

## 2019-08-14 NOTE — Progress Notes (Addendum)
PROGRESS NOTE  Avonna Iribe KKX:[id number] DOB: 1960/07/11 DOA: 07/18/2019 PCP: No primary care provider on file.  Brief History   Marilene Vath a 59 y.o.femalewith past medical history of intracerebral hemorrhage 7 years ago, hypertension presents to the emergency department after family found her unresponsive.  Last known normal 7:45 PM,family found her unresponsive. EMS was called and patient noted to be plegic on the left side and gaze deviation to the right. Blood pressure was 169 systolic on presentation.  When patient arrived to [facility] ER,she was immediately taken for stat CT head which demonstrated a large right thalamic hemorrhage with intraventricular extension and early hydrocephalus.She was emergently intubated for airway protection. Labetalol and Cleviprex was initiated for blood pressure control,initial delay as patient needed to beintubated.Neurosurgery was consulted for EVD placement.  Admitted to neurology ICU on 3/15 for ICH status post tracheostomy and PEG Neurology transfer to [facility] on April 3.  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 Both have been discontinued. ENT was consulted as tongue continued to protrude 4-5 cm past the teeth and 2 cm laterally this morning. Their recommendation is steroids and use of a "Bite Block" manufactured out of tongue depressor guaze and tape. I have communicated this to nursing, but they tongue is so swollen, that they have been unable to fit the bite block in her mouth. She is receiving iV solumedrol. The patient was seen by Dr. Erlinda Hong from neurology today. He had recommended to the family that oral surgery be consulted to remove all of the patient's teeth, so that she wouldn't bite down on her tongue. I have discussed this with Dr. Erlinda Hong and the patient's daughter. I feel it would be prudent to continue steroids and allow them to reduce swelling rather than permanently  remove teeth for a temporary problem. I also feel that it would be quite traumatic and difficult to remove teeth in such a crowded environment. They have expressed understanding. The swelling is slowly improving with IV steroids, ice, benadryl and Pepsid.  On 08/11/2019 and 08/13/2019 the patients telemetry appeared to display ST segment elevation. Follow up EKG on 08/11/2019 demonstrated a repolarization abnormality. On 08/13/2019 the patient's EKG failed to demonstrate the ST segment elevation and troponins were negative.   Consultants  . ENT . Palliative Care . Neurology . Interventional Radiology . PCCM . Neurosurgery  Procedures  . EVD placement . Tracheostomy . Intubation and mechanical ventilation  Antibiotics   Anti-infectives (From admission, onward)   Start     Dose/Rate Route Frequency Ordered Stop   08/03/19 1442  ceFAZolin (ANCEF) 2-4 GM/100ML-% IVPB    Note to Pharmacy: [name]: cabinet override      08/03/19 1442 08/03/19 1458   07/30/19 0930  levofloxacin (LEVAQUIN) IVPB 500 mg     500 mg 100 mL/hr over 60 Minutes Intravenous Every 24 hours 07/30/19 0915 08/03/19 1101   07/23/19 0900  ceFEPIme (MAXIPIME) 2 g in sodium chloride 0.9 % 100 mL IVPB  Status:  Discontinued     2 g 200 mL/hr over 30 Minutes Intravenous Every 8 hours 07/23/19 0854 07/27/19 1503   07/19/19 1100  cefTRIAXone (ROCEPHIN) 2 g in sodium chloride 0.9 % 100 mL IVPB  Status:  Discontinued     2 g 200 mL/hr over 30 Minutes Intravenous Every 24 hours 07/19/19 1052 07/23/19 0842   07/19/19 1100  metroNIDAZOLE (FLAGYL) IVPB 500 mg     500 mg 100 mL/hr over 60  Minutes Intravenous Every 8 hours 07/19/19 1052 07/24/19 0301     Subjective  The patient is somnolent. Tongue continues to appear smaller each day. Still protrudes from lips. Lips no longer swollen.  Objective   Vitals:  Vitals:   08/14/19 1245 08/14/19 1317  BP: (!) 154/99 (!) 180/114  Pulse: 79 84  Resp: 13   Temp:    SpO2:  95% (!) 88%   Exam:  Constitutional:  . The patient is awake. No acute distress. ENMT:  . Tongue large, but continuing to decrease in size. Still protruding beyond lips by 2 cm and laterally by less than 1 cm on each side.. Lips are no longer swollen. Add Benadryl and pepcid. Respiratory:  . No increased work of breathing. . No wheezes, rales, or rhonchi . No tactile fremitus . There are coarse upper airway sounds. Cardiovascular:  . Regular rate and rhythm . No murmurs, ectopy, or gallups. . No lateral PMI. No thrills. Abdomen:  . Abdomen is soft, non-tender, non-distended . No hernias, masses, or organomegaly . Normoactive bowel sounds.  Musculoskeletal:  . No cyanosis, clubbing, or edema Skin:  . No rashes, lesions, ulcers . palpation of skin: no induration or nodules Neurologic:  . Patient unable to cooperate with exam. Psychiatric:  Patient unable to cooperate with exam.  I have personally reviewed the following:   Today's Data  . Vitals, Glucoses  Micro Data  . Urine culture (08/06/2019): Insignificant growth . Blood culture x 2 (08/06/2019): No growth  Scheduled Meds: . carvedilol  25 mg Per Tube BID  . chlorhexidine gluconate (MEDLINE KIT)  15 mL Mouth Rinse BID  . Chlorhexidine Gluconate Cloth  6 each Topical Daily  . chlorthalidone  12.5 mg Oral Daily  . diphenhydrAMINE  25 mg Intravenous Q6H  . free water  140 mL Per Tube Q6H  . guaiFENesin  10 mL Per Tube BID  . insulin aspart  0-20 Units Subcutaneous Q4H  . insulin aspart  7 Units Subcutaneous Q4H  . insulin glargine  24 Units Subcutaneous Daily  . magic mouthwash w/lidocaine  5 mL Oral TID  . mouth rinse  15 mL Mouth Rinse 10 times per day  . methylPREDNISolone (SOLU-MEDROL) injection  80 mg Intravenous Q6H  . sodium chloride flush  10-40 mL Intracatheter Q12H  . triamcinolone   Mouth/Throat TID   Continuous Infusions: . famotidine (PEPCID) IV 20 mg (08/14/19 1139)  . feeding supplement (JEVITY 1.2  CAL) 1,000 mL (08/13/19 2203)    Active Problems:   IVH (intraventricular hemorrhage) (HCC)   Acute respiratory failure with hypoxemia (HCC)   Hypokalemia   Hypophosphatemia   Fever   Palliative care by specialist   Goals of care, counseling/discussion   Full code status   Tongue swelling   LOS: 27 days   A & P  Fever work-up (fever x1 on 4/3 am): She was treated for pneumonia from March 16 to March 20 is with Rocephin and metronidazole. She was treated with cefepime from March 20 is to March 24 when she had EVD. She was treated with Levaquin from March 27 to March 31 for HCAP. Repeat UA/urine culture on April 3 demonstrating only insignificant growth. Blood cultures x 2 collected on that date as well as 3/24, 3/26 have had no growth. Repeat tracheal aspirates from 08/06/2019 have had no growth.  Gram stain has demonstrated only normal respiratory flora. 4/3 CXR demonstrated minimal left basilar atelectasis is noted but improved from the prior study. Family  report patient has been having loose stools since started tube feeds, she has a rectal tube in. Only 100 cc of stool output on 08/08/2019.  Angioedema:improved now 2 days in a row. ENT consulted. I appreciate Dr. Janace Hoard' input. Bite block has been ordered and discussed with nursing to prevent the patient from biting her tongue. She has been started on IV solumedrol. Amlodipine and losartan have been discontinued. Airway intact due to pre-existing tracheostomy. ENT was consulted as tongue continued to protrude 3 cm past the teeth and 1 cm laterally this morning. Their recommendation is steroids and use of a "Bite Block" manufactured out of tongue depressor guaze and tape. I have communicated this to nursing, but they tongue is so swollen, that they have been unable to fit the bite block in her mouth. She is receiving IV solumedrol. The patient was seen by Dr. Erlinda Hong from neurology today. He had recommended to the family that oral surgery be consulted to  remove all of the patient's teeth, so that she wouldn't bite down on her tongue. I have discussed this with Dr. Erlinda Hong and the patient's daughter. I feel it would be prudent to continue steroids and allow them to reduce swelling rather than permanently remove teeth for a temporary problem. I also feel that it would be quite traumatic and difficult to remove teeth in such a crowded environment. They have expressed understanding. I will try ice or cold packs to tongue. I have also added Benadryl and pepcid.   Decreased mentation: Daughter report patient was awake 5 days ago. Per consultants patient with worsening mental status since yesterday. Palliative care is following. They will meet with the family this morning. Sedating medications have been stopped.  Hemorrhagic stroke from hypertensive urgency emergency s/p external ventricular drainage (present on admission): She has history of ICH 7 years ago. Neurology following, will follow recommendation.  Acute respiratory failure due to ICH: Now status post tracheostomy on March 27/status post PEG placement on March 3. RT has changed out trach. The patient had coarse upper airway sounds that has been addressed by Robinul IV.  Anemia from acute illness: Hemoglobin on presentation was normal. Currently hemoglobin 9.7. Monitor.  Hypertension: The patient presented with hypertension/ urgency emergency. Blood pressure is high today on Coreg, and chlorthalidone listed as home medication. Amlodipine and losartan stopped. I have restarted hydralazine at 50 mg q8h.  Abnormal telemetry: On 08/11/2019 and 08/13/2019 the patients telemetry appeared to display ST segment elevation. Follow up EKG on 08/11/2019 demonstrated a repolarization abnormality. On 08/13/2019 the patient's EKG failed to demonstrate the ST segment elevation and troponins were negative.   Obesity: Body mass index is 32.25 kg/m.  I have seen and examined this patient myself. I have spent 32 minutes  in her evaluation and care.  DVT Prophylaxis: SCD's Code Status: full Family Communication: I have discussed the patient in detail with the daughter, [name]. Disposition Plan: The patient came from home. Anticipate discharge to SNF/LTAC. Awaiting PT/OT eval. Barriers to d/c OR conditions which need to be met to effect a safe d/c:Improvement in severe tongue swelling. Need LTAC level of care or skilled nursing facility can take care of trach PEG patient, however patient has no insurance.  Jaedan Schuman, DO [facility] Direct contact: see [email or web address]  7PM-7AM contact night coverage as above 08/14/2019, 3:38 PM  LOS: 21 days

## 2019-08-15 DIAGNOSIS — Z515 Encounter for palliative care: Secondary | ICD-10-CM

## 2019-08-15 DIAGNOSIS — Z789 Other specified health status: Secondary | ICD-10-CM

## 2019-08-15 DIAGNOSIS — R220 Localized swelling, mass and lump, head: Secondary | ICD-10-CM

## 2019-08-15 LAB — GLUCOSE, CAPILLARY
Glucose-Capillary: 175 mg/dL — ABNORMAL HIGH (ref 70–99)
Glucose-Capillary: 188 mg/dL — ABNORMAL HIGH (ref 70–99)
Glucose-Capillary: 190 mg/dL — ABNORMAL HIGH (ref 70–99)
Glucose-Capillary: 198 mg/dL — ABNORMAL HIGH (ref 70–99)
Glucose-Capillary: 198 mg/dL — ABNORMAL HIGH (ref 70–99)
Glucose-Capillary: 201 mg/dL — ABNORMAL HIGH (ref 70–99)

## 2019-08-15 MED ORDER — HYDRALAZINE HCL 50 MG PO TABS
75.0000 mg | ORAL_TABLET | Freq: Three times a day (TID) | ORAL | Status: DC
  Administered 2019-08-15 – 2019-08-16 (×3): 75 mg
  Filled 2019-08-15 (×3): qty 1

## 2019-08-15 MED ORDER — DIPHENHYDRAMINE HCL 50 MG/ML IJ SOLN
12.5000 mg | Freq: Four times a day (QID) | INTRAMUSCULAR | Status: DC
  Administered 2019-08-15 – 2019-08-17 (×8): 12.5 mg via INTRAVENOUS
  Filled 2019-08-15 (×8): qty 1

## 2019-08-15 MED ORDER — METHYLPREDNISOLONE SODIUM SUCC 125 MG IJ SOLR
60.0000 mg | Freq: Four times a day (QID) | INTRAMUSCULAR | Status: DC
  Administered 2019-08-15 – 2019-08-17 (×10): 60 mg via INTRAVENOUS
  Filled 2019-08-15 (×10): qty 2

## 2019-08-15 NOTE — Progress Notes (Signed)
   NAME:  Haley Hernandez, MRN:  [id number], DOB:  Sep 28, 1960, LOS: 28 ADMISSION DATE:  07/18/2019, CONSULTATION DATE:  07/19/2019 REFERRING MD:  Dr. Laurence Slate, CHIEF COMPLAINT:  ICH  Brief History   59 yo female found with AMS and Lt hemiplegia, SBP 250 with resulting ICH of Rt thalamus and BG with IVH.  Intubated for airway protection.  Required tracheostomy due to altered mental status and tongue swelling.  Past Medical History  HTN, OSA, ICH  Consults:  NSGY Neurology ENT Palliative care  Procedures:  3/15 ETT >> 3/27 Trach 3/28 >> 3/16 PICC >>   Micro Data:  SARS CoV2 PCR 3/15 >> negative Sputum 3/17 >> negative Sputum 3/20 >> negative Blood 3/20 >> negative Blood, urine 3/26 >>negative Blood 4/03 >>   Interim history/subjective:  Trach collar at 35%.  Objective   BP (!) 180/103   Pulse 81   Temp 99.1 F (37.3 C) (Axillary)   Resp (!) 21   Ht 5' (1.524 m)   Wt 75.4 kg   SpO2 94%   BMI 32.46 kg/m   I/O last 3 completed shifts: In: 200 [IV Piggyback:200] Out: 200 [Urine:200]  Physical exam: General: Adult female, chronically ill appearing, resting in bed, in NAD. Neuro: Not following any commands. HEENT: [place]/AT. Sclerae anicteric. Protuberant tongue.  Trach C/D/I. Cardiovascular: RRR, no M/R/G.  Lungs: Respirations even and unlabored.  Faint rhonchi bilaterally. Abdomen: BS x 4, soft, NT/ND.  Musculoskeletal: No gross deformities, no edema.  Skin: Intact, warm, no rashes.   Assessment & Plan:   Acute hypoxic respiratory failure 2nd to compromised airway in setting of ICH and macroglossia. Hx of OSA. Tracheostomy status. - trach care - mental status and tongue swelling barriers to decannulation - goal SpO2 > 92% - f/u CXR as needed  Macroglossia - seen by ENT 4/05, felt to be related to drug reaction and tongue biting - keep bite block in place to prevent further tongue biting - receiving solumedrol  ICH. - per neurology  HTN. - per primary  team  Goals of care. - palliative care consulted >> family wishes to continue full medical care  PCCM will f/u weekly for trach care.  Call if help needed sooner.   Rutherford Guys, [place] [facility] 08/15/2019, 9:34 AM

## 2019-08-15 NOTE — Plan of Care (Signed)
  Problem: Education: Goal: Knowledge of disease or condition will improve Outcome: Progressing Goal: Knowledge of secondary prevention will improve Outcome: Progressing Goal: Knowledge of patient specific risk factors addressed and post discharge goals established will improve Outcome: Progressing Goal: Individualized Educational Video(s) Outcome: Progressing   Problem: Coping: Goal: Will verbalize positive feelings about self Outcome: Progressing Goal: Will identify appropriate support needs Outcome: Progressing   Problem: Health Behavior/Discharge Planning: Goal: Ability to manage health-related needs will improve Outcome: Progressing   Problem: Self-Care: Goal: Ability to participate in self-care as condition permits will improve Outcome: Progressing Goal: Verbalization of feelings and concerns over difficulty with self-care will improve Outcome: Progressing Goal: Ability to communicate needs accurately will improve Outcome: Progressing   Problem: Nutrition: Goal: Risk of aspiration will decrease Outcome: Progressing   Problem: Intracerebral Hemorrhage Tissue Perfusion: Goal: Complications of Intracerebral Hemorrhage will be minimized Outcome: Progressing   Problem: Education: Goal: Knowledge of General Education information will improve Description: Including pain rating scale, medication(s)/side effects and non-pharmacologic comfort measures Outcome: Progressing   Problem: Health Behavior/Discharge Planning: Goal: Ability to manage health-related needs will improve Outcome: Progressing   Problem: Clinical Measurements: Goal: Ability to maintain clinical measurements within normal limits will improve Outcome: Progressing Goal: Will remain free from infection Outcome: Progressing Goal: Diagnostic test results will improve Outcome: Progressing Goal: Respiratory complications will improve Outcome: Progressing Goal: Cardiovascular complication will be  avoided Outcome: Progressing   Problem: Activity: Goal: Risk for activity intolerance will decrease Outcome: Progressing   Problem: Nutrition: Goal: Adequate nutrition will be maintained Outcome: Progressing   Problem: Coping: Goal: Level of anxiety will decrease Outcome: Progressing   Problem: Elimination: Goal: Will not experience complications related to bowel motility Outcome: Progressing Goal: Will not experience complications related to urinary retention Outcome: Progressing   Problem: Pain Managment: Goal: General experience of comfort will improve Outcome: Progressing   Problem: Safety: Goal: Ability to remain free from injury will improve Outcome: Progressing   Problem: Skin Integrity: Goal: Risk for impaired skin integrity will decrease Outcome: Progressing   Problem: Activity: Goal: Ability to tolerate increased activity will improve Outcome: Progressing   Problem: Respiratory: Goal: Ability to maintain a clear airway and adequate ventilation will improve Outcome: Progressing   Problem: Role Relationship: Goal: Method of communication will improve Outcome: Progressing

## 2019-08-15 NOTE — Progress Notes (Signed)
PROGRESS NOTE  Haley Hernandez JGO:[id number] DOB: 1960/09/09 DOA: 07/18/2019 PCP: No primary care provider on file.  Brief History   Haley Hernandez a 59 y.o.femalewith past medical history of intracerebral hemorrhage 7 years ago, hypertension presents to the emergency department after family found her unresponsive.  Last known normal 7:45 PM,family found her unresponsive. EMS was called and patient noted to be plegic on the left side and gaze deviation to the right. Blood pressure was 559 systolic on presentation.  When patient arrived to [facility] ER,she was immediately taken for stat CT head which demonstrated a large right thalamic hemorrhage with intraventricular extension and early hydrocephalus.She was emergently intubated for airway protection. Labetalol and Cleviprex was initiated for blood pressure control,initial delay as patient needed to beintubated.Neurosurgery was consulted for EVD placement.  Admitted to neurology ICU on 3/15 for ICH status post tracheostomy and PEG Neurology transfer to [facility] on April 3.  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 Both have been discontinued. ENT was consulted as tongue continued to protrude 4-5 cm past the teeth and 2 cm laterally this morning. Their recommendation is steroids and use of a "Bite Block" manufactured out of tongue depressor guaze and tape. I have communicated this to nursing, but they tongue is so swollen, that they have been unable to fit the bite block in her mouth. She is receiving iV solumedrol. The patient was seen by Dr. Erlinda Hong from neurology today. He had recommended to the family that oral surgery be consulted to remove all of the patient's teeth, so that she wouldn't bite down on her tongue. I have discussed this with Dr. Erlinda Hong and the patient's daughter. I feel it would be prudent to continue steroids and allow them to reduce swelling rather than permanently  remove teeth for a temporary problem. I also feel that it would be quite traumatic and difficult to remove teeth in such a crowded environment. They have expressed understanding. The swelling is slowly improving with IV steroids, ice, benadryl and Pepsid.  On 08/11/2019 and 08/13/2019 the patients telemetry appeared to display ST segment elevation. Follow up EKG on 08/11/2019 demonstrated a repolarization abnormality. On 08/13/2019 the patient's EKG failed to demonstrate the ST segment elevation and troponins were negative.   Consultants  . ENT . Palliative Care . Neurology . Interventional Radiology . PCCM . Neurosurgery  Procedures  . EVD placement . Tracheostomy . Intubation and mechanical ventilation  Antibiotics   Anti-infectives (From admission, onward)   Start     Dose/Rate Route Frequency Ordered Stop   08/03/19 1442  ceFAZolin (ANCEF) 2-4 GM/100ML-% IVPB    Note to Pharmacy: [name]: cabinet override      08/03/19 1442 08/03/19 1458   07/30/19 0930  levofloxacin (LEVAQUIN) IVPB 500 mg     500 mg 100 mL/hr over 60 Minutes Intravenous Every 24 hours 07/30/19 0915 08/03/19 1101   07/23/19 0900  ceFEPIme (MAXIPIME) 2 g in sodium chloride 0.9 % 100 mL IVPB  Status:  Discontinued     2 g 200 mL/hr over 30 Minutes Intravenous Every 8 hours 07/23/19 0854 07/27/19 1503   07/19/19 1100  cefTRIAXone (ROCEPHIN) 2 g in sodium chloride 0.9 % 100 mL IVPB  Status:  Discontinued     2 g 200 mL/hr over 30 Minutes Intravenous Every 24 hours 07/19/19 1052 07/23/19 0842   07/19/19 1100  metroNIDAZOLE (FLAGYL) IVPB 500 mg     500 mg 100 mL/hr over 60  Minutes Intravenous Every 8 hours 07/19/19 1052 07/24/19 0301     Subjective  The patient is somnolent. Tongue is less swollen today, particularly on the right side. No new complaints.  Objective   Vitals:  Vitals:   08/15/19 1110 08/15/19 1158  BP: (!) 176/105 (!) 160/98  Pulse: 82 85  Resp: 20 18  Temp: 99.1 F (37.3 C)    SpO2: 93% 93%   Exam:  Constitutional:  . The patient is awake. No acute distress. ENMT:  . Tongue large, but continuing to decrease in size. Still protruding beyond lips by 2 cm and laterally by less than 1 cm on each side.. Lips are no longer swollen. Continue Benadryl and pepcid, but will decrease dose of benadryl as the patient is excessively sleepy. Respiratory:  . No increased work of breathing. . No wheezes, rales, or rhonchi . No tactile fremitus . There are coarse upper airway sounds. Cardiovascular:  . Regular rate and rhythm . No murmurs, ectopy, or gallups. . No lateral PMI. No thrills. Abdomen:  . Abdomen is soft, non-tender, non-distended . No hernias, masses, or organomegaly . Normoactive bowel sounds.  Musculoskeletal:  . No cyanosis, clubbing, or edema Skin:  . No rashes, lesions, ulcers . palpation of skin: no induration or nodules Neurologic:  . Patient unable to cooperate with exam. Psychiatric:  Patient unable to cooperate with exam.  I have personally reviewed the following:   Today's Data  . Vitals, Glucoses  Micro Data  . Urine culture (08/06/2019): Insignificant growth . Blood culture x 2 (08/06/2019): No growth  Scheduled Meds: . carvedilol  25 mg Per Tube BID  . chlorhexidine gluconate (MEDLINE KIT)  15 mL Mouth Rinse BID  . Chlorhexidine Gluconate Cloth  6 each Topical Daily  . chlorthalidone  12.5 mg Oral Daily  . diphenhydrAMINE  12.5 mg Intravenous Q6H  . free water  140 mL Per Tube Q6H  . guaiFENesin  10 mL Per Tube BID  . hydrALAZINE  75 mg Per Tube Q8H  . insulin aspart  0-20 Units Subcutaneous Q4H  . insulin aspart  7 Units Subcutaneous Q4H  . insulin glargine  24 Units Subcutaneous Daily  . magic mouthwash w/lidocaine  5 mL Oral TID  . mouth rinse  15 mL Mouth Rinse 10 times per day  . methylPREDNISolone (SOLU-MEDROL) injection  60 mg Intravenous Q6H  . sodium chloride flush  10-40 mL Intracatheter Q12H  . triamcinolone    Mouth/Throat TID   Continuous Infusions: . famotidine (PEPCID) IV 20 mg (08/15/19 1152)  . feeding supplement (JEVITY 1.2 CAL) 1,000 mL (08/14/19 1741)    Active Problems:   IVH (intraventricular hemorrhage) (HCC)   Acute respiratory failure with hypoxemia (HCC)   Hypokalemia   Hypophosphatemia   Fever   Palliative care by specialist   Goals of care, counseling/discussion   Full code status   Tongue swelling   LOS: 28 days   A & P  Fever work-up (fever x1 on 4/3 am): She was treated for pneumonia from March 16 to March 20 is with Rocephin and metronidazole. She was treated with cefepime from March 20 is to March 24 when she had EVD. She was treated with Levaquin from March 27 to March 31 for HCAP. Repeat UA/urine culture on April 3 demonstrating only insignificant growth. Blood cultures x 2 collected on that date as well as 3/24, 3/26 have had no growth. Repeat tracheal aspirates from 08/06/2019 have had no growth.  Gram stain has  demonstrated only normal respiratory flora. 4/3 CXR demonstrated minimal left basilar atelectasis is noted but improved from the prior study. Family report patient has been having loose stools since started tube feeds, she has a rectal tube in.   Angioedema:improved now 2 days in a row. ENT consulted. I appreciate Dr. Janace Hoard' input. Bite block has been ordered and discussed with nursing to prevent the patient from biting her tongue. She has been started on IV solumedrol. Amlodipine and losartan have been discontinued. Airway intact due to pre-existing tracheostomy. ENT was consulted as tongue continued to protrude 3 cm past the teeth and 1 cm laterally this morning. Their recommendation is steroids and use of a "Bite Block" manufactured out of tongue depressor guaze and tape. I have communicated this to nursing, but they tongue is so swollen, that they have been unable to fit the bite block in her mouth. She is receiving IV solumedrol. The patient was seen by Dr. Erlinda Hong  from neurology today. He had recommended to the family that oral surgery be consulted to remove all of the patient's teeth, so that she wouldn't bite down on her tongue. I have discussed this with Dr. Erlinda Hong and the patient's daughter. I feel it would be prudent to continue steroids and allow them to reduce swelling rather than permanently remove teeth for a temporary problem. I also feel that it would be quite traumatic and difficult to remove teeth in such a crowded environment. They have expressed understanding. I will try ice or cold packs to tongue. I have also added Benadryl and pepcid, but have redueced the dose of benadryl to 12.5 due to patient's excessive sleepiness.  Decreased mentation: Daughter report patient was awake 5 days ago. Per consultants patient with worsening mental status since yesterday. Palliative care is following. They will meet with the family this morning. Sedating medications have been stopped.  Hemorrhagic stroke from hypertensive urgency emergency s/p external ventricular drainage (present on admission): She has history of ICH 7 years ago. Neurology following, will follow recommendation.  Acute respiratory failure due to ICH: Now status post tracheostomy on March 27/status post PEG placement on March 3. RT has changed out trach. The patient had coarse upper airway sounds that has been addressed by Robinul IV.  Anemia from acute illness: Hemoglobin on presentation was normal. Currently hemoglobin 9.7. Monitor.  Hypertension: The patient presented with hypertension/ urgency emergency. Blood pressure is high today on Coreg, and chlorthalidone listed as home medication. Amlodipine and losartan stopped. I have restarted hydralazine at 50 mg q8h.  Abnormal telemetry: On 08/11/2019 and 08/13/2019 the patients telemetry appeared to display ST segment elevation. Follow up EKG on 08/11/2019 demonstrated a repolarization abnormality. On 08/13/2019 the patient's EKG failed to demonstrate  the ST segment elevation and troponins were negative.   Obesity: Body mass index is 32.25 kg/m.  I have seen and examined this patient myself. I have spent 42 minutes in her evaluation and care. More than 50% of this was spent in counseling with the patient's daughter who is at bedside. All questions answered to the best of my ability.  DVT Prophylaxis: SCD's Code Status: full Family Communication: I have discussed the patient in detail with the daughter, [name]. Disposition Plan: The patient came from home. Anticipate discharge to SNF/LTAC. Awaiting PT/OT eval. Barriers to d/c OR conditions which need to be met to effect a safe d/c:Improvement in severe tongue swelling. Need LTAC level of care or skilled nursing facility can take care of trach PEG patient, however  patient has no insurance.  Angelik Walls, DO [facility] Direct contact: see [email or web address]  7PM-7AM contact night coverage as above 08/15/2019, 2:34 PM  LOS: 21 days

## 2019-08-16 LAB — CBC WITH DIFFERENTIAL/PLATELET
Abs Immature Granulocytes: 0.23 10*3/uL — ABNORMAL HIGH (ref 0.00–0.07)
Basophils Absolute: 0 10*3/uL (ref 0.0–0.1)
Basophils Relative: 0 %
Eosinophils Absolute: 0 10*3/uL (ref 0.0–0.5)
Eosinophils Relative: 0 %
HCT: 32 % — ABNORMAL LOW (ref 36.0–46.0)
Hemoglobin: 10.2 g/dL — ABNORMAL LOW (ref 12.0–15.0)
Immature Granulocytes: 1 %
Lymphocytes Relative: 3 %
Lymphs Abs: 0.5 10*3/uL — ABNORMAL LOW (ref 0.7–4.0)
MCH: 28 pg (ref 26.0–34.0)
MCHC: 31.9 g/dL (ref 30.0–36.0)
MCV: 87.9 fL (ref 80.0–100.0)
Monocytes Absolute: 0.8 10*3/uL (ref 0.1–1.0)
Monocytes Relative: 5 %
Neutro Abs: 14.5 10*3/uL — ABNORMAL HIGH (ref 1.7–7.7)
Neutrophils Relative %: 91 %
Platelets: 269 10*3/uL (ref 150–400)
RBC: 3.64 MIL/uL — ABNORMAL LOW (ref 3.87–5.11)
RDW: 14 % (ref 11.5–15.5)
WBC: 15.9 10*3/uL — ABNORMAL HIGH (ref 4.0–10.5)
nRBC: 0 % (ref 0.0–0.2)

## 2019-08-16 LAB — GLUCOSE, CAPILLARY
Glucose-Capillary: 163 mg/dL — ABNORMAL HIGH (ref 70–99)
Glucose-Capillary: 174 mg/dL — ABNORMAL HIGH (ref 70–99)
Glucose-Capillary: 177 mg/dL — ABNORMAL HIGH (ref 70–99)
Glucose-Capillary: 179 mg/dL — ABNORMAL HIGH (ref 70–99)
Glucose-Capillary: 180 mg/dL — ABNORMAL HIGH (ref 70–99)
Glucose-Capillary: 188 mg/dL — ABNORMAL HIGH (ref 70–99)
Glucose-Capillary: 202 mg/dL — ABNORMAL HIGH (ref 70–99)

## 2019-08-16 LAB — BASIC METABOLIC PANEL
Anion gap: 9 (ref 5–15)
BUN: 40 mg/dL — ABNORMAL HIGH (ref 6–20)
CO2: 33 mmol/L — ABNORMAL HIGH (ref 22–32)
Calcium: 10.8 mg/dL — ABNORMAL HIGH (ref 8.9–10.3)
Chloride: 95 mmol/L — ABNORMAL LOW (ref 98–111)
Creatinine, Ser: 0.68 mg/dL (ref 0.44–1.00)
GFR calc Af Amer: >60 mL/min (ref >60)
GFR calc non Af Amer: >60 mL/min (ref >60)
Glucose, Bld: 207 mg/dL — ABNORMAL HIGH (ref 70–99)
Potassium: 3.7 mmol/L (ref 3.5–5.1)
Sodium: 137 mmol/L (ref 135–145)

## 2019-08-16 MED ORDER — LABETALOL HCL 200 MG PO TABS
200.0000 mg | ORAL_TABLET | Freq: Two times a day (BID) | ORAL | Status: DC
  Administered 2019-08-16: 200 mg via ORAL
  Filled 2019-08-16: qty 1

## 2019-08-16 MED ORDER — CHLORTHALIDONE 25 MG PO TABS
12.5000 mg | ORAL_TABLET | Freq: Every day | ORAL | Status: DC
  Administered 2019-08-16 – 2019-08-25 (×10): 12.5 mg
  Filled 2019-08-16 (×10): qty 1

## 2019-08-16 MED ORDER — LABETALOL HCL 200 MG PO TABS: 200.0000 mg | ORAL_TABLET | Freq: Two times a day (BID) | ORAL | Status: DC

## 2019-08-16 MED ORDER — HYDRALAZINE HCL 50 MG PO TABS
100.0000 mg | ORAL_TABLET | Freq: Three times a day (TID) | ORAL | Status: DC
  Administered 2019-08-16 – 2019-08-31 (×45): 100 mg
  Filled 2019-08-16 (×46): qty 2

## 2019-08-16 MED ORDER — LABETALOL HCL 5 MG/ML IV SOLN
20.0000 mg | Freq: Once | INTRAVENOUS | Status: DC
  Filled 2019-08-16: qty 4

## 2019-08-16 NOTE — Progress Notes (Signed)
Occupational Therapy Treatment Patient Details Name: Haley Hernandez MRN: [id number] DOB: 12-09-1960 Today's Date: 08/16/2019    History of present illness 59 y.o. female with past medical history of intracerebral hemorrhage 7 years ago, hypertension presents to the emergency department after family found her unresponsive with L hemipleiga and R gaze deviation. 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 Pt intubated and with EVD placed on 3/15. EVD removed 3/24. tracheostomy 3/27; EEG 3/29 moderate diffuse encephalopathy with no seizures (multiple events of RUE twitching occurred during EEG without EEG changes)   OT comments  Pt noted to have elevated BP and RN / MD notified so bed level activity provided to help monitor vitals and facilitate trunk / cervical rotation. Pt with + stool with lack of awareness. Pt with nystagmus with repositioning each time. Dysconjugate gaze with R eye R gaze preference and L eye in midline. Pt does not track therapist but does turn cervical toward R on command x2 during session. Pt requires repeat cues for arousal during session and decrease activity tolerance with bed level activation cued by therapist. Recommend Pravalon boots bil for bed level wear with skin checks by RN every 4 hours. Please order if agree   Follow Up Recommendations  SNF;Supervision/Assistance - 24 hour    Equipment Recommendations  Hospital bed(hoyer )    Recommendations for Other Services      Precautions / Restrictions Precautions Precautions: Fall Precaution Comments: SBP<160, HOB>30 degrees Restrictions Weight Bearing Restrictions: No       Mobility Bed Mobility Overal bed mobility: Needs Assistance Bed Mobility: Rolling Rolling: +2 for physical assistance;Total assist         General bed mobility comments: max multimodal cues for use of R UE/LE to assist with bed mobility; total A +2 to roll side to side for  hygiene/skin check; pt placed in chair position and SCDs removed with a slight decrease in BP  Transfers                 General transfer comment: not appropriate at this time    Balance                                           ADL either performed or assessed with clinical judgement   ADL Overall ADL's : Needs assistance/impaired Eating/Feeding: NPO   Grooming: Oral care;Total assistance Grooming Details (indicate cue type and reason): hand over hand for oral swab but does not attempt to grasp. pt provided oral care with swab to help moisturize tongue. tongue with teeth on mid tonge but no noted wounds present. size of tongue greatly reduced compared to previous session. Upper Body Bathing: Total assistance   Lower Body Bathing: Total assistance   Upper Body Dressing : Total assistance   Lower Body Dressing: Total assistance                 General ADL Comments: total (A) for all adls. noted to have incontinence and lack of awareness     Vision       Perception     Praxis      Cognition Arousal/Alertness: Lethargic;Awake/alert Behavior During Therapy: Flat affect Overall Cognitive Status: Difficult to assess  General Comments: pt more alert this session vs previous session and opening eyes to voice; pt follows single step cues ~25 % of time with R UE/LE. pt thumbs up and perseveration when second command given/ pt asked to show two fingers and choosing thumb and 2nd digit with tactile input from OT. pt turning head to the R with cue x1        Exercises Exercises: Other exercises Other Exercises Other Exercises: PROM for all extremities; L side flaccid; AROM of R ankle/toes noted with cues   Other Exercises: pt provide trunk rotation with bed mobility and hand over hand to reach with R UE for bed rail.  Other Exercises: weight bearing on L Side for peri care  Other Exercises: chair  position used to help monitor BP and facilitate trunk elongation, neck position to neutral with towel support, edema management with pillow for L UE   Shoulder Instructions       General Comments SBP range 202-168; DBP range 114-99 depending on position/activity     Pertinent Vitals/ Pain       Pain Assessment: Faces Faces Pain Scale: Hurts little more Pain Location: grimacing at times with bed mobility Pain Intervention(s): Repositioned;Monitored during session  Home Living                                          Prior Functioning/Environment              Frequency  Min 2X/week        Progress Toward Goals  OT Goals(current goals can now be found in the care plan section)  Progress towards OT goals: Progressing toward goals  Acute Rehab OT Goals Patient Stated Goal: Pt unable to participate in goal setting, to improve strength and reduce caregiver burden OT Goal Formulation: Patient unable to participate in goal setting Time For Goal Achievement: 08/23/19 Potential to Achieve Goals: Poor ADL Goals Pt Will Perform Grooming: with max assist;bed level Additional ADL Goal #1: Pt will perform bed mobility with Max A +2 in preparation for ADLs Additional ADL Goal #2: Pt will tolerate sitting at EOB with Max A in preparation for ADLs Additional ADL Goal #3: Pt will follow one step commands during ADLs   Plan Discharge plan remains appropriate    Co-evaluation    PT/OT/SLP Co-Evaluation/Treatment: Yes Reason for Co-Treatment: Complexity of the patient's impairments (multi-system involvement);Necessary to address cognition/behavior during functional activity;For patient/therapist safety;To address functional/ADL transfers PT goals addressed during session: Strengthening/ROM;Mobility/safety with mobility OT goals addressed during session: ADL's and self-care;Strengthening/ROM;Proper use of Adaptive equipment and DME      AM-PAC OT "6 Clicks" Daily  Activity     Outcome Measure   Help from another person eating meals?: Total Help from another person taking care of personal grooming?: Total Help from another person toileting, which includes using toliet, bedpan, or urinal?: Total Help from another person bathing (including washing, rinsing, drying)?: Total Help from another person to put on and taking off regular upper body clothing?: Total Help from another person to put on and taking off regular lower body clothing?: Total 6 Click Score: 6    End of Session Equipment Utilized During Treatment: Oxygen(35% 8 L )  OT Visit Diagnosis: Unsteadiness on feet (R26.81);Other abnormalities of gait and mobility (R26.89);Muscle weakness (generalized) (M62.81);Pain;Low vision, both eyes (H54.2)   Activity Tolerance Patient limited by lethargy  Patient Left in bed;with call bell/phone within reach;with bed alarm set;with SCD's reapplied   Nurse Communication Mobility status;Precautions        Time: 9507-2257 OT Time Calculation (min): 38 min  Charges: OT General Charges $OT Visit: 1 Visit OT Treatments $Self Care/Home Management : 23-37 mins   Brynn, OTR/L  Acute Rehabilitation Services Pager: [phone] Office: [phone] .    Jeri Modena 08/16/2019, 2:36 PM

## 2019-08-16 NOTE — Progress Notes (Signed)
OT NOTE  OT contacted unit secretary to have [facility] for pravalon boots to be worn by patient with skin checks every 4 hours.   Timmothy Euler, OTR/L  Acute Rehabilitation Services Pager: [phone] Office: [phone] .

## 2019-08-16 NOTE — Progress Notes (Signed)
Prevolone boots applied to bi-lateral lower extremities

## 2019-08-16 NOTE — Plan of Care (Signed)
  Problem: Education: Goal: Knowledge of disease or condition will improve Outcome: Progressing Goal: Knowledge of secondary prevention will improve Outcome: Progressing Goal: Knowledge of patient specific risk factors addressed and post discharge goals established will improve Outcome: Progressing Goal: Individualized Educational Video(s) Outcome: Progressing   Problem: Coping: Goal: Will verbalize positive feelings about self Outcome: Progressing Goal: Will identify appropriate support needs Outcome: Progressing   Problem: Health Behavior/Discharge Planning: Goal: Ability to manage health-related needs will improve Outcome: Progressing   Problem: Self-Care: Goal: Ability to participate in self-care as condition permits will improve Outcome: Progressing Goal: Verbalization of feelings and concerns over difficulty with self-care will improve Outcome: Progressing Goal: Ability to communicate needs accurately will improve Outcome: Progressing   Problem: Nutrition: Goal: Risk of aspiration will decrease Outcome: Progressing   Problem: Intracerebral Hemorrhage Tissue Perfusion: Goal: Complications of Intracerebral Hemorrhage will be minimized Outcome: Progressing   Problem: Education: Goal: Knowledge of General Education information will improve Description: Including pain rating scale, medication(s)/side effects and non-pharmacologic comfort measures Outcome: Progressing   Problem: Health Behavior/Discharge Planning: Goal: Ability to manage health-related needs will improve Outcome: Progressing   Problem: Clinical Measurements: Goal: Ability to maintain clinical measurements within normal limits will improve Outcome: Progressing Goal: Will remain free from infection Outcome: Progressing Goal: Diagnostic test results will improve Outcome: Progressing Goal: Respiratory complications will improve Outcome: Progressing Goal: Cardiovascular complication will be  avoided Outcome: Progressing   Problem: Activity: Goal: Risk for activity intolerance will decrease Outcome: Progressing   Problem: Nutrition: Goal: Adequate nutrition will be maintained Outcome: Progressing   Problem: Coping: Goal: Level of anxiety will decrease Outcome: Progressing   Problem: Elimination: Goal: Will not experience complications related to bowel motility Outcome: Progressing Goal: Will not experience complications related to urinary retention Outcome: Progressing   Problem: Pain Managment: Goal: General experience of comfort will improve Outcome: Progressing   Problem: Safety: Goal: Ability to remain free from injury will improve Outcome: Progressing   Problem: Skin Integrity: Goal: Risk for impaired skin integrity will decrease Outcome: Progressing   Problem: Activity: Goal: Ability to tolerate increased activity will improve Outcome: Progressing   Problem: Respiratory: Goal: Ability to maintain a clear airway and adequate ventilation will improve Outcome: Progressing   Problem: Role Relationship: Goal: Method of communication will improve Outcome: Progressing   

## 2019-08-16 NOTE — Progress Notes (Signed)
PROGRESS NOTE  Haley Hernandez KKX:[id number] DOB: 1960/07/11 DOA: 07/18/2019 PCP: No primary care provider on file.  Brief History   Haley Hernandez a 59 y.o.femalewith past medical history of intracerebral hemorrhage 7 years ago, hypertension presents to the emergency department after family found her unresponsive.  Last known normal 7:45 PM,family found her unresponsive. EMS was called and patient noted to be plegic on the left side and gaze deviation to the right. Blood pressure was 169 systolic on presentation.  When patient arrived to [facility] ER,she was immediately taken for stat CT head which demonstrated a large right thalamic hemorrhage with intraventricular extension and early hydrocephalus.She was emergently intubated for airway protection. Labetalol and Cleviprex was initiated for blood pressure control,initial delay as patient needed to beintubated.Neurosurgery was consulted for EVD placement.  Admitted to neurology ICU on 3/15 for ICH status post tracheostomy and PEG Neurology transfer to [facility] on April 3.  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 Both have been discontinued. ENT was consulted as tongue continued to protrude 4-5 cm past the teeth and 2 cm laterally this morning. Their recommendation is steroids and use of a "Bite Block" manufactured out of tongue depressor guaze and tape. I have communicated this to nursing, but they tongue is so swollen, that they have been unable to fit the bite block in her mouth. She is receiving iV solumedrol. The patient was seen by Dr. Erlinda Hong from neurology today. He had recommended to the family that oral surgery be consulted to remove all of the patient's teeth, so that she wouldn't bite down on her tongue. I have discussed this with Dr. Erlinda Hong and the patient's daughter. I feel it would be prudent to continue steroids and allow them to reduce swelling rather than permanently  remove teeth for a temporary problem. I also feel that it would be quite traumatic and difficult to remove teeth in such a crowded environment. They have expressed understanding. The swelling is slowly improving with IV steroids, ice, benadryl and Pepsid.  On 08/11/2019 and 08/13/2019 the patients telemetry appeared to display ST segment elevation. Follow up EKG on 08/11/2019 demonstrated a repolarization abnormality. On 08/13/2019 the patient's EKG failed to demonstrate the ST segment elevation and troponins were negative.   Consultants  . ENT . Palliative Care . Neurology . Interventional Radiology . PCCM . Neurosurgery  Procedures  . EVD placement . Tracheostomy . Intubation and mechanical ventilation  Antibiotics   Anti-infectives (From admission, onward)   Start     Dose/Rate Route Frequency Ordered Stop   08/03/19 1442  ceFAZolin (ANCEF) 2-4 GM/100ML-% IVPB    Note to Pharmacy: [name]: cabinet override      08/03/19 1442 08/03/19 1458   07/30/19 0930  levofloxacin (LEVAQUIN) IVPB 500 mg     500 mg 100 mL/hr over 60 Minutes Intravenous Every 24 hours 07/30/19 0915 08/03/19 1101   07/23/19 0900  ceFEPIme (MAXIPIME) 2 g in sodium chloride 0.9 % 100 mL IVPB  Status:  Discontinued     2 g 200 mL/hr over 30 Minutes Intravenous Every 8 hours 07/23/19 0854 07/27/19 1503   07/19/19 1100  cefTRIAXone (ROCEPHIN) 2 g in sodium chloride 0.9 % 100 mL IVPB  Status:  Discontinued     2 g 200 mL/hr over 30 Minutes Intravenous Every 24 hours 07/19/19 1052 07/23/19 0842   07/19/19 1100  metroNIDAZOLE (FLAGYL) IVPB 500 mg     500 mg 100 mL/hr over 60  Minutes Intravenous Every 8 hours 07/19/19 1052 07/24/19 0301     Subjective  The patient is resting comfortably. Tongue appears much less swollen today.   Objective   Vitals:  Vitals:   08/16/19 0837 08/16/19 0843  BP: (!) 209/117 (!) 172/108  Pulse: 91 84  Resp: (!) 21 19  Temp:    SpO2: 94% 94%   Exam:  Constitutional:   . The patient is awake. No acute distress. ENMT:  Tongue appears much smaller. Still protruding beyond lips by 1 cm. Lips are no longer swollen. Continue Benadryl and pepcid. Wean steroids. Respiratory:  . No increased work of breathing. . No wheezes, rales, or rhonchi . No tactile fremitus . There are coarse upper airway sounds. Cardiovascular:  . Regular rate and rhythm . No murmurs, ectopy, or gallups. . No lateral PMI. No thrills. Abdomen:  . Abdomen is soft, non-tender, non-distended . No hernias, masses, or organomegaly . Normoactive bowel sounds.  Musculoskeletal:  . No cyanosis, clubbing, or edema Skin:  . No rashes, lesions, ulcers . palpation of skin: no induration or nodules Neurologic:  . Patient unable to cooperate with exam. Psychiatric:  Patient unable to cooperate with exam.  I have personally reviewed the following:   Today's Data  . Vitals, Glucoses  Micro Data  . Urine culture (08/06/2019): Insignificant growth . Blood culture x 2 (08/06/2019): No growth  Scheduled Meds: . chlorhexidine gluconate (MEDLINE KIT)  15 mL Mouth Rinse BID  . Chlorhexidine Gluconate Cloth  6 each Topical Daily  . chlorthalidone  12.5 mg Per Tube Daily  . diphenhydrAMINE  12.5 mg Intravenous Q6H  . free water  140 mL Per Tube Q6H  . guaiFENesin  10 mL Per Tube BID  . hydrALAZINE  100 mg Per Tube Q8H  . insulin aspart  0-20 Units Subcutaneous Q4H  . insulin aspart  7 Units Subcutaneous Q4H  . insulin glargine  24 Units Subcutaneous Daily  . labetalol  200 mg Oral BID  . magic mouthwash w/lidocaine  5 mL Oral TID  . mouth rinse  15 mL Mouth Rinse 10 times per day  . methylPREDNISolone (SOLU-MEDROL) injection  60 mg Intravenous Q6H  . sodium chloride flush  10-40 mL Intracatheter Q12H  . triamcinolone   Mouth/Throat TID   Continuous Infusions: . famotidine (PEPCID) IV 20 mg (08/16/19 0822)  . feeding supplement (JEVITY 1.2 CAL) 1,000 mL (08/16/19 0024)    Active  Problems:   IVH (intraventricular hemorrhage) (HCC)   Acute respiratory failure with hypoxemia (HCC)   Hypokalemia   Hypophosphatemia   Fever   Palliative care by specialist   Goals of care, counseling/discussion   Full code status   Tongue swelling   LOS: 29 days   A & P  Fever work-up (fever x1 on 4/3 am): She was treated for pneumonia from March 16 to March 20 is with Rocephin and metronidazole. She was treated with cefepime from March 20 is to March 24 when she had EVD. She was treated with Levaquin from March 27 to March 31 for HCAP. Repeat UA/urine culture on April 3 demonstrating only insignificant growth. Blood cultures x 2 collected on that date as well as 3/24, 3/26 have had no growth. Repeat tracheal aspirates from 08/06/2019 have had no growth.  Gram stain has demonstrated only normal respiratory flora. 4/3 CXR demonstrated minimal left basilar atelectasis is noted but improved from the prior study. Family report patient has been having loose stools since started tube feeds,  she has a rectal tube in.   Angioedema: Now much improved. Wean steroids, continue benadryl and pepcid. ENT consulted. I appreciate Dr. Janace Hoard' input. Bite block has been ordered and discussed with nursing to prevent the patient from biting her tongue. She has been started on IV solumedrol. Amlodipine and losartan have been discontinued. Airway intact due to pre-existing tracheostomy. ENT was consulted as tongue continued to protrude 3 cm past the teeth and 1 cm laterally this morning. Their recommendation is steroids and use of a "Bite Block" manufactured out of tongue depressor guaze and tape. I have communicated this to nursing, but they tongue was so swollen, that they have been unable to fit the bite block in her mouth.   Decreased mentation: Daughter report patient was awake 5 days ago. Per consultants patient with worsening mental status since yesterday. Palliative care is following. They will meet with the  family this morning. Sedating medications have been stopped.  Hemorrhagic stroke from hypertensive urgency emergency s/p external ventricular drainage (present on admission): She has history of ICH 7 years ago. Neurology following, will follow recommendation.  Acute respiratory failure due to ICH: Now status post tracheostomy on March 27/status post PEG placement on March 3. RT has changed out trach. The patient had coarse upper airway sounds that has been addressed by Robinul IV.  Anemia from acute illness: Hemoglobin on presentation was normal. Currently hemoglobin 10.2. Monitor.  Hypertension: Blood pressure very high this morning. Improved now. She is receiving hydralazine 100 mg every 8 hours, chlorthalidone, and labetalol 200 mg bid.   Abnormal telemetry: On 08/11/2019 and 08/13/2019 the patients telemetry appeared to display ST segment elevation. Follow up EKG on 08/11/2019 demonstrated a repolarization abnormality. On 08/13/2019 the patient's EKG failed to demonstrate the ST segment elevation and troponins were negative.   Obesity: Body mass index is 32.25 kg/m.  I have seen and examined this patient myself. I have spent 34 minutes in her evaluation and care.   DVT Prophylaxis: SCD's Code Status: full Family Communication: None available. Awaiting PT/OT eval. Barriers to d/c OR conditions which need to be met to effect a safe d/c:Resolution of severe tongue swelling. Need LTAC level of care or skilled nursing facility can take care of trach PEG patient, however patient has no insurance.  Haley Diekman, DO [facility] Direct contact: see [email or web address]  7PM-7AM contact night coverage as above 08/16/2019, 2:34 PM  LOS: 21 days

## 2019-08-16 NOTE — Progress Notes (Signed)
Physical Therapy Treatment Patient Details Name: Haley Hernandez MRN: [id number] DOB: 1960-12-27 Today's Date: 08/16/2019    History of Present Illness 59 y.o. female with past medical history of intracerebral hemorrhage 7 years ago, hypertension presents to the emergency department after family found her unresponsive with L hemipleiga and R gaze deviation. 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 Pt intubated and with EVD placed on 3/15. EVD removed 3/24. tracheostomy 3/27; EEG 3/29 moderate diffuse encephalopathy with no seizures (multiple events of RUE twitching occurred during EEG without EEG changes)    PT Comments    Patient is more awake/alert compared to previous PT sessions. Pt with minimal AROM of R UE/LE this session and L side remains flaccid. Pt opening eyes to voice but maintains only brief periods of time. Total A +2 for bed mobility. BP elevated and RN notified. See general comments below. SCDs removed and pt placed in chair position in bed with slight decrease in BP. PT will continue to follow acutely.   Follow Up Recommendations  SNF;LTACH;Supervision/Assistance - 24 hour     Equipment Recommendations  Other (comment)(TBD)    Recommendations for Other Services       Precautions / Restrictions Precautions Precautions: Fall Restrictions Weight Bearing Restrictions: No    Mobility  Bed Mobility Overal bed mobility: Needs Assistance Bed Mobility: Rolling Rolling: +2 for physical assistance;Total assist         General bed mobility comments: max multimodal cues for use of R UE/LE to assist with bed mobility; total A +2 to roll side to side for hygiene/skin check; pt placed in chair position and SCDs removed with a slight decrease in BP  Transfers                 General transfer comment: not appropriate at this time  Ambulation/Gait                 Stairs             Wheelchair  Mobility    Modified Rankin (Stroke Patients Only) Modified Rankin (Stroke Patients Only) Modified Rankin: Severe disability     Balance                                            Cognition Arousal/Alertness: Lethargic;Awake/alert Behavior During Therapy: Flat affect Overall Cognitive Status: Difficult to assess                                 General Comments: pt more alert this session vs previous session and opening eyes to voice; pt follows single step cues ~25 % of time with R UE/LE      Exercises Other Exercises Other Exercises: PROM for all extremities; L side flaccid; AROM of R ankle/toes noted with cues      General Comments General comments (skin integrity, edema, etc.): SBP range 202-168; DBP range 114-99 depending on position/activity       Pertinent Vitals/Pain Pain Assessment: Faces Faces Pain Scale: Hurts little more Pain Location: grimacing at times with bed mobility Pain Intervention(s): Repositioned;Monitored during session    Home Living                      Prior Function  PT Goals (current goals can now be found in the care plan section) Progress towards PT goals: Not progressing toward goals - comment    Frequency    Min 2X/week(decr by Barry Brunner, PT )      PT Plan Current plan remains appropriate    Co-evaluation PT/OT/SLP Co-Evaluation/Treatment: Yes Reason for Co-Treatment: Complexity of the patient's impairments (multi-system involvement);Necessary to address cognition/behavior during functional activity;To address functional/ADL transfers;For patient/therapist safety PT goals addressed during session: Strengthening/ROM;Mobility/safety with mobility        AM-PAC PT "6 Clicks" Mobility   Outcome Measure  Help needed turning from your back to your side while in a flat bed without using bedrails?: Total Help needed moving from lying on your back to sitting on the side of a  flat bed without using bedrails?: Total Help needed moving to and from a bed to a chair (including a wheelchair)?: Total Help needed standing up from a chair using your arms (e.g., wheelchair or bedside chair)?: Total Help needed to walk in hospital room?: Total Help needed climbing 3-5 steps with a railing? : Total 6 Click Score: 6    End of Session Equipment Utilized During Treatment: Oxygen Activity Tolerance: Patient tolerated treatment well Patient left: in bed;with call bell/phone within reach;with bed alarm set;Other (comment)(bed in chair position) Nurse Communication: Other (comment)(elevated BP) PT Visit Diagnosis: Other symptoms and signs involving the nervous system (R29.898);Muscle weakness (generalized) (M62.81);Hemiplegia and hemiparesis Hemiplegia - Right/Left: Left Hemiplegia - caused by: Nontraumatic intracerebral hemorrhage     Time: 0881-1031 PT Time Calculation (min) (ACUTE ONLY): 39 min  Charges:  $Therapeutic Activity: 8-22 mins                     [name], PTA Acute Rehabilitation Services Pager: [phone] Office: [phone]     Darliss Cheney 08/16/2019, 2:23 PM

## 2019-08-17 LAB — GLUCOSE, CAPILLARY
Glucose-Capillary: 130 mg/dL — ABNORMAL HIGH (ref 70–99)
Glucose-Capillary: 149 mg/dL — ABNORMAL HIGH (ref 70–99)
Glucose-Capillary: 176 mg/dL — ABNORMAL HIGH (ref 70–99)
Glucose-Capillary: 179 mg/dL — ABNORMAL HIGH (ref 70–99)
Glucose-Capillary: 188 mg/dL — ABNORMAL HIGH (ref 70–99)
Glucose-Capillary: 207 mg/dL — ABNORMAL HIGH (ref 70–99)

## 2019-08-17 MED ORDER — METHYLPREDNISOLONE SODIUM SUCC 40 MG IJ SOLR
40.0000 mg | Freq: Every day | INTRAMUSCULAR | Status: DC
  Administered 2019-08-18 – 2019-08-22 (×5): 40 mg via INTRAVENOUS
  Filled 2019-08-17 (×5): qty 1

## 2019-08-17 MED ORDER — LABETALOL HCL 200 MG PO TABS
200.0000 mg | ORAL_TABLET | Freq: Two times a day (BID) | ORAL | Status: DC
  Administered 2019-08-17: 200 mg
  Filled 2019-08-17: qty 1

## 2019-08-17 MED ORDER — LABETALOL HCL 200 MG PO TABS
200.0000 mg | ORAL_TABLET | Freq: Three times a day (TID) | ORAL | Status: DC
  Administered 2019-08-17 – 2019-08-31 (×41): 200 mg
  Filled 2019-08-17 (×41): qty 1

## 2019-08-17 NOTE — TOC Progression Note (Signed)
Transition of Care [facility]) - Progression Note    Patient Details  Name: Haley Hernandez MRN: [id number] Date of Birth: [birth year]  Transition of Care [facility]) CM/SW Contact  Terrilee Croak, Student-Social Work Phone Number: 08/17/2019, 3:53 PM  Clinical Narrative:     MSW Intern spoke with pt's daughter, Jon Gills at bedside to discuss SNF. The family is agreeable to SNF at this time and would prefer [facility] if possible. Questions were answered and will complete FL2 and fax out. SW will continue to follow.    Barriers to Discharge: Continued Medical Work up, English as a second language teacher, Inadequate or no Clinical research associate and Services       Post Acute Care Choice: Long Term Acute Care (LTAC), Skilled Nursing Facility Living arrangements for the past 2 months: Single Family Home                                       Social Determinants of Health (SDOH) Interventions    Readmission Risk Interventions No flowsheet data found.

## 2019-08-17 NOTE — Progress Notes (Signed)
Brace on left arm off

## 2019-08-17 NOTE — Progress Notes (Addendum)
PROGRESS NOTE  Haley Hernandez JQZ:[id number] DOB: 1960-07-27 DOA: 07/18/2019 PCP: No primary care provider on file.  Brief History   Haley Hernandez a 59 y.o.femalewith past medical history of intracerebral hemorrhage 7 years ago, hypertension presents to the emergency department after family found her unresponsive.  Last known normal 7:45 PM,family found her unresponsive. EMS was called and patient noted to be plegic on the left side and gaze deviation to the right. Blood pressure was 622 systolic on presentation.  When patient arrived to [facility] ER,she was immediately taken for stat CT head which demonstrated a large right thalamic hemorrhage with intraventricular extension and early hydrocephalus.She was emergently intubated for airway protection. Labetalol and Cleviprex was initiated for blood pressure control,initial delay as patient needed to beintubated.Neurosurgery was consulted for EVD placement.  Admitted to neurology ICU on 3/15 for ICH status post tracheostomy and PEG Neurology transfer to [facility] on April 3.  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 Both have been discontinued. ENT was consulted as tongue continued to protrude 4-5 cm past the teeth and 2 cm laterally this morning. Their recommendation is steroids and use of a "Bite Block" manufactured out of tongue depressor guaze and tape. I have communicated this to nursing, but they tongue is so swollen, that they have been unable to fit the bite block in her mouth. She is receiving iV solumedrol. The patient was seen by Dr. Erlinda Hong from neurology today. He had recommended to the family that oral surgery be consulted to remove all of the patient's teeth, so that she wouldn't bite down on her tongue. I have discussed this with Dr. Erlinda Hong and the patient's daughter. I feel it would be prudent to continue steroids and allow them to reduce swelling rather than permanently  remove teeth for a temporary problem. I also feel that it would be quite traumatic and difficult to remove teeth in such a crowded environment. They have expressed understanding. The swelling is slowly improving with IV steroids, ice, benadryl and Pepcid.  On 08/11/2019 and 08/13/2019 the patients telemetry appeared to display ST segment elevation. Follow up EKG on 08/11/2019 demonstrated a repolarization abnormality. On 08/13/2019 the patient's EKG failed to demonstrate the ST segment elevation and troponins were negative.   Consultants   ENT  Palliative Care  Neurology  Interventional Radiology  PCCM  Neurosurgery  Procedures   EVD placement  Tracheostomy  Intubation and mechanical ventilation  Antibiotics   Anti-infectives (From admission, onward)   Start     Dose/Rate Route Frequency Ordered Stop   08/03/19 1442  ceFAZolin (ANCEF) 2-4 GM/100ML-% IVPB    Note to Pharmacy: [name]: cabinet override      08/03/19 1442 08/03/19 1458   07/30/19 0930  levofloxacin (LEVAQUIN) IVPB 500 mg     500 mg 100 mL/hr over 60 Minutes Intravenous Every 24 hours 07/30/19 0915 08/03/19 1101   07/23/19 0900  ceFEPIme (MAXIPIME) 2 g in sodium chloride 0.9 % 100 mL IVPB  Status:  Discontinued     2 g 200 mL/hr over 30 Minutes Intravenous Every 8 hours 07/23/19 0854 07/27/19 1503   07/19/19 1100  cefTRIAXone (ROCEPHIN) 2 g in sodium chloride 0.9 % 100 mL IVPB  Status:  Discontinued     2 g 200 mL/hr over 30 Minutes Intravenous Every 24 hours 07/19/19 1052 07/23/19 0842   07/19/19 1100  metroNIDAZOLE (FLAGYL) IVPB 500 mg     500 mg 100 mL/hr over 60  Minutes Intravenous Every 8 hours 07/19/19 1052 07/24/19 0301     Subjective  The patient is resting comfortably. Tongue appears much less swollen today.   Objective   Vitals:  Vitals:   08/17/19 0831 08/17/19 1221  BP: 117/81 (!) 159/105  Pulse: 80 80  Resp: 16 20  Temp: 99.1 F (37.3 C) 99.2 F (37.3 C)  SpO2: 96% 95%    Exam:  Constitutional:   The patient is awake. No acute distress. ENMT:  Tongue appears much smaller. Although it still protrudes beyond lips by about 1 cm, she occasionally pulls the tongue back into her mouth. Lips are no longer swollen. Continue Benadryl and pepcid. Wean steroids. Respiratory:   No increased work of breathing.  No wheezes, rales, or rhonchi  No tactile fremitus  There are coarse upper airway sounds. Cardiovascular:   Regular rate and rhythm  No murmurs, ectopy, or gallups.  No lateral PMI. No thrills. Abdomen:   Abdomen is soft, non-tender, non-distended  No hernias, masses, or organomegaly  Normoactive bowel sounds.  Musculoskeletal:   No cyanosis, clubbing, or edema Skin:   No rashes, lesions, ulcers  palpation of skin: no induration or nodules Neurologic:   Patient unable to cooperate with exam. Psychiatric:  Patient unable to cooperate with exam.  I have personally reviewed the following:   Today's Data   Vitals, Glucoses  Micro Data   Urine culture (08/06/2019): Insignificant growth  Blood culture x 2 (08/06/2019): No growth  Scheduled Meds:  chlorhexidine gluconate (MEDLINE KIT)  15 mL Mouth Rinse BID   Chlorhexidine Gluconate Cloth  6 each Topical Daily   chlorthalidone  12.5 mg Per Tube Daily   diphenhydrAMINE  12.5 mg Intravenous Q6H   free water  140 mL Per Tube Q6H   guaiFENesin  10 mL Per Tube BID   hydrALAZINE  100 mg Per Tube Q8H   insulin aspart  0-20 Units Subcutaneous Q4H   insulin aspart  7 Units Subcutaneous Q4H   insulin glargine  24 Units Subcutaneous Daily   labetalol  200 mg Per Tube BID   magic mouthwash w/lidocaine  5 mL Oral TID   mouth rinse  15 mL Mouth Rinse 10 times per day   methylPREDNISolone (SOLU-MEDROL) injection  60 mg Intravenous Q6H   sodium chloride flush  10-40 mL Intracatheter Q12H   triamcinolone   Mouth/Throat TID   Continuous Infusions:  famotidine (PEPCID) IV 20  mg (08/17/19 0916)   feeding supplement (JEVITY 1.2 CAL) 1,000 mL (08/16/19 1918)    Active Problems:   IVH (intraventricular hemorrhage) ([place])   Acute respiratory failure with hypoxemia (HCC)   Hypokalemia   Hypophosphatemia   Fever   Palliative care by specialist   Goals of care, counseling/discussion   Full code status   Tongue swelling   LOS: 30 days   A & P  Fever work-up (fever x1 on 4/3 am): She was treated for pneumonia from March 16 to March 20 is with Rocephin and metronidazole. She was treated with cefepime from March 20 is to March 24 when she had EVD. She was treated with Levaquin from March 27 to March 31 for HCAP. Repeat UA/urine culture on April 3 demonstrating only insignificant growth. Blood cultures x 2 collected on that date as well as 3/24, 3/26 have had no growth. Repeat tracheal aspirates from 08/06/2019 have had no growth.  Gram stain has demonstrated only normal respiratory flora. 4/3 CXR demonstrated minimal left basilar atelectasis is noted but  improved from the prior study.   Angioedema: Resolving. Wean steroids, benadryl and pepcid. ENT consulted upon onset. I appreciate Dr. Janace Hoard' input. Bite block was ordered and discussed with nursing to prevent the patient from biting her tongue. However, the patient's tongue was so swollen that block could not be inserted. She has been started on IV solumedrol. It is being weaned. Benadryl has been stopped. Amlodipine and losartan have been discontinued. Airway intact due to pre-existing tracheostomy.   Decreased mentation: Daughter report patient was awake 5 days ago. More awake in the last 2 days. Per consultants patient with worsening mental status since yesterday. Palliative care is following. They will meet with the family this morning. Sedating medications have been stopped.  Hemorrhagic stroke from hypertensive urgency emergency s/p external ventricular drainage (present on admission): She has history of ICH 7 years  ago. Neurology following, will follow recommendation.  Acute respiratory failure due to ICH: Now status post tracheostomy on March 27/status post PEG placement on March 3. RT has changed out trach. The patient had coarse upper airway sounds that has been addressed by Robinul IV.  Anemia from acute illness: Hemoglobin on presentation was normal. Currently hemoglobin 10.2. Monitor.  Hypertension: Blood pressure, although improved, still high. Will increase frequency of labetalol to tid.. Improved now. She is now receiving hydralazine 100 mg every 8 hours, chlorthalidone, and labetalol 200 mg tid.   Abnormal telemetry: On 08/11/2019 and 08/13/2019 the patients telemetry appeared to display ST segment elevation. Follow up EKG on 08/11/2019 demonstrated a repolarization abnormality. On 08/13/2019 the patient's EKG failed to demonstrate the ST segment elevation and troponins were negative.   Obesity: Body mass index is 32.25 kg/m.  I have seen and examined this patient myself. I have spent 32 minutes in her evaluation and care.   DVT Prophylaxis: SCD's Code Status: full Family Communication: Daughter at bedside. Disposition: Patient is from home. Disposition will be to SNF. Barriers to d/c OR conditions which need to be met to effect a safe d/c:Resolution of severe tongue swelling.Weaning steroids. Need LTAC level of care or skilled nursing facility can take care of trach PEG patient.  Sandra Tellefsen, DO [facility] Direct contact: see [email or web address]  7PM-7AM contact night coverage as above 08/17/2019, 2:42 PM  LOS: 21 days

## 2019-08-17 NOTE — NC FL2 (Signed)
[place] LEVEL OF CARE SCREENING TOOL     IDENTIFICATION  Patient Name: Haley Hernandez Birthdate: 1960-05-06 Sex: female Admission Date (Current Location): 07/18/2019  [facility] and [place] Number:  Herbalist and Address:  The [place]. [facility], [place] [address] [place], [place] [address]      Provider Number: [id number]  Attending Physician Name and Address:  Karie Kirks, DO  Relative Name and Phone Number:  Dalissa Lovin, [phone]    Current Level of Care: Hospital Recommended Level of Care: [place] Prior Approval Number:    Date Approved/Denied:   PASRR Number: [id number]  Discharge Plan: SNF    Current Diagnoses: Patient Active Problem List   Diagnosis Date Noted  . Tongue swelling   . Palliative care by specialist   . Goals of care, counseling/discussion   . Full code status   . Acute respiratory failure with hypoxemia ([place]) 07/23/2019  . Hypokalemia 07/23/2019  . Hypophosphatemia 07/23/2019  . Fever 07/23/2019  . IVH (intraventricular hemorrhage) ([place]) 07/18/2019  . OBSTRUCTIVE SLEEP APNEA 06/27/2008  . COPD 06/27/2008  . COUGH DUE TO ACE INHIBITORS 03/03/2008  . HYPOXEMIA 03/03/2008    Orientation RESPIRATION BLADDER Height & Weight     (Unable to asess)  Vent External catheter Weight: 167 lb 15.9 oz (76.2 kg) Height:  5' (152.4 cm)  BEHAVIORAL SYMPTOMS/MOOD NEUROLOGICAL BOWEL NUTRITION STATUS      Incontinent (See discharge summary)  AMBULATORY STATUS COMMUNICATION OF NEEDS Skin   Total Care Does not communicate Surgical wounds, Skin abrasions(Tracheostomy, Tongue Laceration)                       Personal Care Assistance Level of Assistance  Bathing, Feeding, Dressing, Total care Bathing Assistance: Maximum assistance Feeding assistance: Maximum assistance Dressing Assistance: Maximum assistance Total Care Assistance: Maximum assistance   Functional Limitations Info  Sight,  Hearing, Speech Sight Info: (Unable to asess) Hearing Info: (Unable to [employer]) Speech Info: Impaired    SPECIAL CARE FACTORS FREQUENCY  PT (By licensed PT), OT (By licensed OT), Speech therapy     PT Frequency: 5x week OT Frequency: 5x week     Speech Therapy Frequency: 5x week      Contractures Contractures Info: Not present    Additional Factors Info  Code Status, Allergies, Insulin Sliding Scale Code Status Info: Full Allergies Info: Penicillins   Insulin Sliding Scale Info: Insulin Aspart (Novolog) 0-20 U every 4 hours and 7 units every 4 hours. Insulin Glargine (Lantus) 24 U daily.       Current Medications (08/17/2019):  This is the current hospital active medication list Current Facility-Administered Medications  Medication Dose Route Frequency Provider Last Rate Last Admin  . acetaminophen (TYLENOL) tablet 650 mg  650 mg Oral Q4H PRN Donzetta Starch, NP       Or  . acetaminophen (TYLENOL) 160 MG/5ML solution 650 mg  650 mg Per Tube Q4H PRN Donzetta Starch, NP   650 mg at 08/16/19 2005   Or  . acetaminophen (TYLENOL) suppository 650 mg  650 mg Rectal Q4H PRN Donzetta Starch, NP      . albuterol (PROVENTIL) (2.5 MG/3ML) 0.083% nebulizer solution 2.5 mg  2.5 mg Nebulization Q4H PRN Donzetta Starch, NP      . chlorhexidine gluconate (MEDLINE KIT) (PERIDEX) 0.12 % solution 15 mL  15 mL Mouth Rinse BID Burnetta Sabin L, NP   15 mL at  08/17/19 0910  . Chlorhexidine Gluconate Cloth 2 % PADS 6 each  6 each Topical Daily Donzetta Starch, NP   6 each at 08/16/19 2133  . chlorthalidone (HYGROTON) tablet 12.5 mg  12.5 mg Per Tube Daily Swayze, Ava, DO   12.5 mg at 08/17/19 0906  . diphenhydrAMINE (BENADRYL) injection 12.5 mg  12.5 mg Intravenous Q6H Swayze, Ava, DO   12.5 mg at 08/17/19 1122  . famotidine (PEPCID) IVPB 20 mg premix  20 mg Intravenous Q12H Swayze, Ava, DO 100 mL/hr at 08/17/19 0916 20 mg at 08/17/19 0916  . feeding supplement (JEVITY 1.2 CAL) liquid 1,000 mL  1,000 mL Per  Tube Continuous Donzetta Starch, NP 60 mL/hr at 08/17/19 1512 1,000 mL at 08/17/19 1512  . free water 140 mL  140 mL Per Tube Q6H Swayze, Ava, DO   140 mL at 08/17/19 1239  . glycopyrrolate (ROBINUL) injection 0.1 mg  0.1 mg Intravenous Q4H PRN Swayze, Ava, DO   0.1 mg at 08/14/19 1107  . guaiFENesin (ROBITUSSIN) 100 MG/5ML solution 200 mg  10 mL Per Tube BID Florencia Reasons, MD   200 mg at 08/17/19 0906  . hydrALAZINE (APRESOLINE) tablet 100 mg  100 mg Per Tube Q8H Swayze, Ava, DO   100 mg at 08/17/19 1501  . insulin aspart (novoLOG) injection 0-20 Units  0-20 Units Subcutaneous Q4H Swayze, Ava, DO   4 Units at 08/17/19 1239  . insulin aspart (novoLOG) injection 7 Units  7 Units Subcutaneous Q4H Swayze, Ava, DO   7 Units at 08/17/19 1240  . insulin glargine (LANTUS) injection 24 Units  24 Units Subcutaneous Daily Swayze, Ava, DO   24 Units at 08/17/19 0907  . labetalol (NORMODYNE) injection 10-40 mg  10-40 mg Intravenous Q10 min PRN Donzetta Starch, NP   10 mg at 08/16/19 2008  . labetalol (NORMODYNE) tablet 200 mg  200 mg Per Tube TID Swayze, Ava, DO      . loperamide HCl (IMODIUM) 1 MG/7.5ML suspension 2 mg  2 mg Per Tube BID PRN Donzetta Starch, NP   2 mg at 07/27/19 2106  . magic mouthwash w/lidocaine  5 mL Oral TID Swayze, Ava, DO   5 mL at 08/17/19 0907  . MEDLINE mouth rinse  15 mL Mouth Rinse 10 times per day Donzetta Starch, NP   15 mL at 08/17/19 1501  . methylPREDNISolone sodium succinate (SOLU-MEDROL) 125 mg/2 mL injection 60 mg  60 mg Intravenous Q6H Swayze, Ava, DO   60 mg at 08/17/19 0906  . oxyCODONE (Oxy IR/ROXICODONE) immediate release tablet 5 mg  5 mg Per Tube Q6H PRN Florencia Reasons, MD   5 mg at 08/11/19 2141  . sodium chloride flush (NS) 0.9 % injection 10-40 mL  10-40 mL Intracatheter Q12H Biby, Sharon L, NP   10 mL at 08/17/19 0908  . sodium chloride flush (NS) 0.9 % injection 10-40 mL  10-40 mL Intracatheter PRN Donzetta Starch, NP   10 mL at 08/05/19 2122  . triamcinolone (KENALOG) 0.1 %  paste   Mouth/Throat TID Donzetta Starch, NP   Given at 08/17/19 0908     Discharge Medications: Please see discharge summary for a list of discharge medications.  Relevant Imaging Results:  Relevant Lab Results:   Additional Information SS# [place], [place] Work

## 2019-08-17 NOTE — Progress Notes (Signed)
Nutrition Follow-up  DOCUMENTATION CODES:   Obesity unspecified  INTERVENTION:  Continue  Jevity 1.2 @ 60 ml/hr (1440 ml/day)  163m free water flush QID  Provides: 1728 kcal, 80 grams protein, and 1167 ml free water  (17296mfree water total with TF)   NUTRITION DIAGNOSIS:   Inadequate oral intake related to inability to eat as evidenced by NPO status.  Ongoing.  GOAL:   Patient will meet greater than or equal to 90% of their needs  Met with TF.  MONITOR:   TF tolerance  REASON FOR ASSESSMENT:   Ventilator    ASSESSMENT:   Pt with PMH of HTN, OSA, and prior ICH admitted from home with acute intraparenchymal hemorrhage in R thalamus and basal ganglia with IVH extension s/p EVD.  3/24 cortrak tube placed, tip gastric 3/27 trach placed 3/31 Cortrak removed, TF on hold for PEG placement 4/4 pt developed macroglossia, currently being treated with bite block and steroids  Discussed pt with RN. Pt remains on trach collar.   Per MD, pt's tongue appears much less swollen.   Current tube feeding orders: Jevity 1.2 cal @ 6021mr with 140m25mee water QID  UOP: 600ml17m hours I/O: +14,472.7ml s70me admit  Current weight: 76.2 kg Admit weight: 75 kg  Medications reviewed and include: Pepcid, Novolog, Lantus, Solu-medrol  Labs reviewed: CBG's: [phone]  Diet Order:   Diet Order    None      EDUCATION NEEDS:   No education needs have been identified at this time  Skin:  Skin Assessment: Skin Integrity Issues: Skin Integrity Issues:: Other (Comment) Other: laceration under tongue  Last BM:  4/13  Height:   Ht Readings from Last 1 Encounters:  07/25/19 5' (1.524 m)    Weight:   Wt Readings from Last 1 Encounters:  08/17/19 76.2 kg    BMI:  Body mass index is 32.81 kg/m.  Estimated Nutritional Needs:   Kcal:  1650-1850  Protein:  80-100 grams  Fluid:  >1.6 L/day   AmandaLarkin InaRD, LDN RD pager number and  weekend/on-call pager number located in [email or web address]

## 2019-08-18 DIAGNOSIS — Z7189 Other specified counseling: Secondary | ICD-10-CM

## 2019-08-18 LAB — GLUCOSE, CAPILLARY
Glucose-Capillary: 165 mg/dL — ABNORMAL HIGH (ref 70–99)
Glucose-Capillary: 151 mg/dL — ABNORMAL HIGH (ref 70–99)
Glucose-Capillary: 128 mg/dL — ABNORMAL HIGH (ref 70–99)
Glucose-Capillary: 162 mg/dL — ABNORMAL HIGH (ref 70–99)
Glucose-Capillary: 215 mg/dL — ABNORMAL HIGH (ref 70–99)

## 2019-08-18 MED ORDER — FAMOTIDINE 20 MG PO TABS
20.0000 mg | ORAL_TABLET | Freq: Two times a day (BID) | ORAL | Status: DC
  Administered 2019-08-18: 20 mg via ORAL
  Filled 2019-08-18: qty 1

## 2019-08-18 NOTE — Progress Notes (Signed)
PROGRESS NOTE    Haley Hernandez  EBR:[id number] DOB: 03-11-1961 DOA: 07/18/2019 PCP: No primary care provider on file.   Brief Narrative:  Haley Hernandez a 59 y.o.femalewith past medical history of intracerebral hemorrhage 7 years ago, hypertension presents to the emergency department after family found her unresponsive.  Last known normal 7:45 PM,family found her unresponsive. EMS was called and patient noted to be plegic on the left side and gaze deviation to the right. Blood pressure was 088 systolic on presentation.  When patient arrived to [facility] ER,she was immediately taken for stat CT head which demonstrated a large right thalamic hemorrhage with intraventricular extension and early hydrocephalus.She was emergently intubated for airway protection. Labetalol and Cleviprex was initiated for blood pressure control,initial delay as patient needed to beintubated.Neurosurgery was consulted for EVD placement.  Admitted to neurology ICU on 3/15 for ICH status post tracheostomy and PEG Neurology transfer to [facility] on April 3.  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 Both have been discontinued. ENT was consulted as tongue continued to protrude 4-5 cm past the teeth and 2 cm laterally this morning. Their recommendation is steroids and use of a "Bite Block" manufactured out of tongue depressor guaze and tape. I have communicated this to nursing, but they tongue is so swollen, that they have been unable to fit the bite block in her mouth. She is receiving iV solumedrol. The patient was seen by Dr. Erlinda Hong from neurology today. He had recommended to the family that oral surgery be consulted to remove all of the patient's teeth, so that she wouldn't bite down on her tongue. I have discussed this with Dr. Erlinda Hong and the patient's daughter. I feel it would be prudent to continue steroids and allow them to reduce swelling rather than  permanently remove teeth for a temporary problem. I also feel that it would be quite traumatic and difficult to remove teeth in such a crowded environment. They have expressed understanding. The swelling is slowly improving with IV steroids, ice, benadryl and Pepcid.  On 08/11/2019 and 08/13/2019 the patients telemetry appeared to display ST segment elevation. Follow up EKG on 08/11/2019 demonstrated a repolarization abnormality. On 08/13/2019 the patient's EKG failed to demonstrate the ST segment elevation and troponins were negative.    Assessment & Plan:   Active Problems:   IVH (intraventricular hemorrhage) (HCC)   Acute respiratory failure with hypoxemia (HCC)   Hypokalemia   Hypophosphatemia   Fever   Palliative care by specialist   Goals of care, counseling/discussion   Full code status   Tongue swelling  Fever work-up (feverx1on 4/3 am): She was treated for pneumonia from March 16 to March 20 is with Rocephin and metronidazole. She was treated with cefepime from March 20 is to March 24 when she had EVD. She was treated with Levaquin from March 27 to March 31 for HCAP. Repeat UA/urine culture on April 3 demonstrating only insignificant growth. Blood cultures x 2 collected on that date as well as 3/24, 3/26 have had no growth. Repeat tracheal aspirates from 08/06/2019 have had no growth.  Gram stain has demonstrated only normal respiratory flora. 4/3 CXR demonstrated minimal left basilar atelectasis is noted but improved from the prior study.   Angioedema: ->>> Resolving. Wean steroids, benadryl and pepcid. ENT consulted upon onset. I appreciate Dr. Janace Hoard' input. Bite block was ordered and discussed with nursing to prevent the patient from biting her tongue. However, the patient's tongue was so swollen  that block could not be inserted. She has been started on IV solumedrol. It is being weaned. Benadryl has been stopped. Amlodipine and losartan have been discontinued. Airway intact due to  pre-existing tracheostomy.   Decreased mentation: Daughter reports patient was awake 5 days ago. More awake in the last 2 days. Per consultants patient with worsening mental status since yesterday. Palliative care is following. They will meet with the family this morning. Sedating medications have been stopped.  Hemorrhagic stroke from hypertensive urgency emergency s/p external ventricular drainage (present on admission): She has history of ICH 7 years ago. Neurology following,will follow recommendation.  Acute respiratory failure due to ICH: Now status post tracheostomy on March 27/status post PEG placement on March 3. RT has changed out trach. The patient had coarse upper airway sounds that has been addressed by Robinul IV.  Anemia from acute illness: Hemoglobin on presentation was normal. Currently hemoglobin 10.2. Monitor.  Hypertension: Blood pressure, although improved, still high. Will increase frequency of labetalol to tid.. Improved now. She is now receiving hydralazine 100 mg every 8 hours, chlorthalidone, and labetalol 200 mg tid.   Abnormal telemetry: On 08/11/2019 and 08/13/2019 the patients telemetry appeared to display ST segment elevation. Follow up EKG on 08/11/2019 demonstrated a repolarization abnormality. On 08/13/2019 the patient's EKG failed to demonstrate the ST segment elevation and troponins were negative.   Obesity: Body mass index is 32.25 kg/m.  I have seen and examined this patient myself. I have spent 32 minutes in her evaluation and care.   DVT Prophylaxis: SCD's Code Status:full Family Communication:Daughter at bedside. Disposition: Patient is from home. Disposition will be to SNF. Barriers to d/c OR conditions which need to be met to effect a safe d/c:Resolution of severe tongue swelling.Weaning steroids.Need LTAC level of care or skilled nursing facility can take care of trach PEG patient.    Consultants:     Procedures:  Antimicrobials:   Anti-infectives (From admission, onward)   Start     Dose/Rate Route Frequency Ordered Stop   08/03/19 1442  ceFAZolin (ANCEF) 2-4 GM/100ML-% IVPB    Note to Pharmacy: [name]: cabinet override      08/03/19 1442 08/03/19 1458   07/30/19 0930  levofloxacin (LEVAQUIN) IVPB 500 mg     500 mg 100 mL/hr over 60 Minutes Intravenous Every 24 hours 07/30/19 0915 08/03/19 1101   07/23/19 0900  ceFEPIme (MAXIPIME) 2 g in sodium chloride 0.9 % 100 mL IVPB  Status:  Discontinued     2 g 200 mL/hr over 30 Minutes Intravenous Every 8 hours 07/23/19 0854 07/27/19 1503   07/19/19 1100  cefTRIAXone (ROCEPHIN) 2 g in sodium chloride 0.9 % 100 mL IVPB  Status:  Discontinued     2 g 200 mL/hr over 30 Minutes Intravenous Every 24 hours 07/19/19 1052 07/23/19 0842   07/19/19 1100  metroNIDAZOLE (FLAGYL) IVPB 500 mg     500 mg 100 mL/hr over 60 Minutes Intravenous Every 8 hours 07/19/19 1052 07/24/19 0301       Subjective: Patient was seen and examined at bedside, no overnight events.  Patient is lying comfortably,  tongue swelling has significantly reduced.  Objective: Vitals:   08/18/19 1035 08/18/19 1040 08/18/19 1153 08/18/19 1214  BP:    140/87  Pulse: (!) 58 79 83 69  Resp: _0 Temp:    99.2 F (37.3 C)  TempSrc:    Axillary  SpO2: 94% 96% 94% 93%  Weight:  Height:        Intake/Output Summary (Last 24 hours) at 08/18/2019 1330 Last data filed at 08/18/2019 0810 Gross per 24 hour  Intake 870 ml  Output 1550 ml  Net -680 ml   Filed Weights   08/16/19 0432 08/17/19 0409 08/18/19 0500  Weight: 74.4 kg 76.2 kg 74.3 kg    Examination:  General exam: Appears calm and comfortable, tongue swelling has reduced.  Respiratory system: Clear to auscultation. Respiratory effort normal. Cardiovascular system: S1 & S2 heard, RRR. No JVD, murmurs, rubs, gallops or clicks. No pedal edema. Gastrointestinal system: Abdomen is nondistended, soft and nontender. No  organomegaly or masses felt. Normal bowel sounds heard. Central nervous system: Alert and oriented. No focal neurological deficits. Extremities: not examined. Skin: No rashes, lesions or ulcers Psychiatry: not examined.    Data Reviewed: I have personally reviewed following labs and imaging studies  CBC: Recent Labs  Lab 08/12/19 0342 08/16/19 0430  WBC 11.1* 15.9*  NEUTROABS 9.6* 14.5*  HGB 9.7* 10.2*  HCT 31.2* 32.0*  MCV 89.7 87.9  PLT 361 595   Basic Metabolic Panel: Recent Labs  Lab 08/12/19 0342 08/16/19 0430  NA 138 137  K 3.9 3.7  CL 95* 95*  CO2 35* 33*  GLUCOSE 200* 207*  BUN 49* 40*  CREATININE 0.72 0.68  CALCIUM 11.8* 10.8*   GFR: Estimated Creatinine Clearance: 69.8 mL/min (by C-G formula based on SCr of 0.68 mg/dL). Liver Function Tests: No results for input(s): AST, ALT, ALKPHOS, BILITOT, PROT, ALBUMIN in the last 168 hours. No results for input(s): LIPASE, AMYLASE in the last 168 hours. No results for input(s): AMMONIA in the last 168 hours. Coagulation Profile: No results for input(s): INR, PROTIME in the last 168 hours. Cardiac Enzymes: No results for input(s): CKTOTAL, CKMB, CKMBINDEX, TROPONINI in the last 168 hours. BNP (last 3 results) No results for input(s): PROBNP in the last 8760 hours. HbA1C: No results for input(s): HGBA1C in the last 72 hours. CBG: Recent Labs  Lab 08/17/19 1938 08/17/19 2335 08/18/19 0329 08/18/19 0754 08/18/19 1132  GLUCAP 130* 176* 165* 151* 128*   Lipid Profile: No results for input(s): CHOL, HDL, LDLCALC, TRIG, CHOLHDL, LDLDIRECT in the last 72 hours. Thyroid Function Tests: No results for input(s): TSH, T4TOTAL, FREET4, T3FREE, THYROIDAB in the last 72 hours. Anemia Panel: No results for input(s): VITAMINB12, FOLATE, FERRITIN, TIBC, IRON, RETICCTPCT in the last 72 hours. Sepsis Labs: No results for input(s): PROCALCITON, LATICACIDVEN in the last 168 hours.  No results found for this or any  previous visit (from the past 240 hour(s)).   Radiology Studies: No results found.   Sheduled Meds: . chlorhexidine gluconate (MEDLINE KIT)  15 mL Mouth Rinse BID  . Chlorhexidine Gluconate Cloth  6 each Topical Daily  . chlorthalidone  12.5 mg Per Tube Daily  . famotidine  20 mg Oral BID  . free water  140 mL Per Tube Q6H  . guaiFENesin  10 mL Per Tube BID  . hydrALAZINE  100 mg Per Tube Q8H  . insulin aspart  0-20 Units Subcutaneous Q4H  . insulin aspart  7 Units Subcutaneous Q4H  . insulin glargine  24 Units Subcutaneous Daily  . labetalol  200 mg Per Tube TID  . magic mouthwash w/lidocaine  5 mL Oral TID  . mouth rinse  15 mL Mouth Rinse 10 times per day  . methylPREDNISolone (SOLU-MEDROL) injection  40 mg Intravenous Daily  . sodium chloride flush  10-40 mL Intracatheter  Q12H  . triamcinolone   Mouth/Throat TID   Continuous Infusions: . feeding supplement (JEVITY 1.2 CAL) 1,000 mL (08/18/19 1045)     LOS: 31 days    Time spent: 25 mins.    Shawna Clamp, MD [facility]   If 7PM-7AM, please contact night-coverage

## 2019-08-18 NOTE — TOC Progression Note (Addendum)
Transition of Care [facility]) - Progression Note    Patient Details  Name: Haley Hernandez MRN: [id number] Date of Birth: 1960-09-20  Transition of [facility] CM/SW Contact  Kermit Balo, RN Phone Number: 08/18/2019, 12:29 PM  Clinical Narrative:    Pt has 2 bed offers: [facility] in [facility] and [facility] in [place]. [facility] is also interested. CM has provided this information to both daughters.  Daughters inquired about [facility] and they have denied her for admission and [facility] in [place]. CM has left [facility] admissions a voicemail.  Daughters have been reluctant for pt to d/c to [facility] but are willing to tour and talk with admissions rep. CM has provided [facility] the oldest daughters phone number.  TOC following.  1312 Addendum: [facility] has denied admission. They wont accept a trach less than 45 days old.     Barriers to Discharge: Continued Medical Work up, English as a second language teacher, Inadequate or no Clinical research associate and Services       Post Acute Care Choice: Long Term Acute Care (LTAC), Skilled Nursing Facility Living arrangements for the past 2 months: Single Family Home                                       Social Determinants of Health (SDOH) Interventions    Readmission Risk Interventions No flowsheet data found.

## 2019-08-19 LAB — BASIC METABOLIC PANEL
Anion gap: 8 (ref 5–15)
BUN: 31 mg/dL — ABNORMAL HIGH (ref 6–20)
CO2: 34 mmol/L — ABNORMAL HIGH (ref 22–32)
Calcium: 10.7 mg/dL — ABNORMAL HIGH (ref 8.9–10.3)
Chloride: 93 mmol/L — ABNORMAL LOW (ref 98–111)
Creatinine, Ser: 0.58 mg/dL (ref 0.44–1.00)
GFR calc Af Amer: >60 mL/min (ref >60)
GFR calc non Af Amer: >60 mL/min (ref >60)
Glucose, Bld: 130 mg/dL — ABNORMAL HIGH (ref 70–99)
Potassium: 3.7 mmol/L (ref 3.5–5.1)
Sodium: 135 mmol/L (ref 135–145)

## 2019-08-19 LAB — CBC
HCT: 31.3 % — ABNORMAL LOW (ref 36.0–46.0)
Hemoglobin: 10 g/dL — ABNORMAL LOW (ref 12.0–15.0)
MCH: 28 pg (ref 26.0–34.0)
MCHC: 31.9 g/dL (ref 30.0–36.0)
MCV: 87.7 fL (ref 80.0–100.0)
Platelets: 197 10*3/uL (ref 150–400)
RBC: 3.57 MIL/uL — ABNORMAL LOW (ref 3.87–5.11)
RDW: 14.6 % (ref 11.5–15.5)
WBC: 10.9 10*3/uL — ABNORMAL HIGH (ref 4.0–10.5)
nRBC: 0 % (ref 0.0–0.2)

## 2019-08-19 LAB — GLUCOSE, CAPILLARY
Glucose-Capillary: 107 mg/dL — ABNORMAL HIGH (ref 70–99)
Glucose-Capillary: 122 mg/dL — ABNORMAL HIGH (ref 70–99)
Glucose-Capillary: 126 mg/dL — ABNORMAL HIGH (ref 70–99)
Glucose-Capillary: 147 mg/dL — ABNORMAL HIGH (ref 70–99)
Glucose-Capillary: 151 mg/dL — ABNORMAL HIGH (ref 70–99)
Glucose-Capillary: 162 mg/dL — ABNORMAL HIGH (ref 70–99)
Glucose-Capillary: 92 mg/dL (ref 70–99)

## 2019-08-19 LAB — PHOSPHORUS: Phosphorus: 3.2 mg/dL (ref 2.5–4.6)

## 2019-08-19 LAB — MAGNESIUM: Magnesium: 1.9 mg/dL (ref 1.7–2.4)

## 2019-08-19 MED ORDER — FAMOTIDINE 20 MG PO TABS
20.0000 mg | ORAL_TABLET | Freq: Two times a day (BID) | ORAL | Status: DC
  Administered 2019-08-19 – 2019-08-31 (×24): 20 mg
  Filled 2019-08-19 (×24): qty 1

## 2019-08-19 NOTE — Progress Notes (Addendum)
Physical Therapy Treatment Patient Details Name: Haley Hernandez MRN: [id number] DOB: 03/30/1961 Today's Date: 08/19/2019    History of Present Illness 59 y.o. female with past medical history of intracerebral hemorrhage 7 years ago, hypertension presents to the emergency department after family found her unresponsive with L hemipleiga and R gaze deviation. 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 Pt intubated and with EVD placed on 3/15. EVD removed 3/24. tracheostomy 3/27; EEG 3/29 moderate diffuse encephalopathy with no seizures (multiple events of RUE twitching occurred during EEG without EEG changes)    PT Comments    Patient is awake and alert this session and demonstrates improved visual tracking and following simple commands. Pt able to work on sitting balance EOB with mod A +2 for ~20 minutes. PT will continue to follow acutely and progress as tolerated.    Follow Up Recommendations  SNF;LTACH;Supervision/Assistance - 24 hour     Equipment Recommendations  Other (comment)    Recommendations for Other Services       Precautions / Restrictions Precautions Precautions: Fall Restrictions Weight Bearing Restrictions: No    Mobility  Bed Mobility Overal bed mobility: Needs Assistance Bed Mobility: Rolling;Supine to Sit;Sit to Supine Rolling: +2 for physical assistance;Total assist   Supine to sit: +2 for physical assistance;Total assist Sit to supine: +2 for physical assistance;Total assist   General bed mobility comments: Pt cued and assisted to turn head toward side when rolling and to flex opposite hip, worked on reaching with R hand to rail, maintaining sidelying  Transfers                    Ambulation/Gait                 Stairs             Wheelchair Mobility    Modified Rankin (Stroke Patients Only)       Balance Overall balance assessment: Needs assistance Sitting-balance  support: Bilateral upper extremity supported;Feet unsupported Sitting balance-[name]: Poor Sitting balance - Comments: mod A +2; +1 for physical assist but +2 for safety and therapuetic activities in sitting; pt assisting with R UE; L UE approximation                                    Cognition Arousal/Alertness: Awake/alert Behavior During Therapy: Flat affect Overall Cognitive Status: Difficult to assess                                 General Comments: pt following simple commands with increased time, "squeeze my hand," "wipe your mouth", "squeeze your eyes closed"      Exercises      General Comments General comments (skin integrity, edema, etc.): dysconjugate gaze      Pertinent Vitals/Pain Pain Assessment: Faces Faces Pain Scale: No hurt    Home Living                      Prior Function            PT Goals (current goals can now be found in the care plan section) Acute Rehab PT Goals Patient Stated Goal: Pt unable to participate in goal setting, to improve strength and reduce caregiver burden Progress towards PT goals: Progressing toward goals    Frequency  Min 3X/week      PT Plan Frequency needs to be updated;Current plan remains appropriate    Co-evaluation PT/OT/SLP Co-Evaluation/Treatment: Yes Reason for Co-Treatment: Complexity of the patient's impairments (multi-system involvement);For patient/therapist safety PT goals addressed during session: Mobility/safety with mobility;Balance OT goals addressed during session: Strengthening/ROM      AM-PAC PT "6 Clicks" Mobility   Outcome Measure  Help needed turning from your back to your side while in a flat bed without using bedrails?: Total Help needed moving from lying on your back to sitting on the side of a flat bed without using bedrails?: Total Help needed moving to and from a bed to a chair (including a wheelchair)?: Total Help needed standing up  from a chair using your arms (e.g., wheelchair or bedside chair)?: Total Help needed to walk in hospital room?: Total Help needed climbing 3-5 steps with a railing? : Total 6 Click Score: 6    End of Session Equipment Utilized During Treatment: Oxygen Activity Tolerance: Patient tolerated treatment well Patient left: in bed;with call bell/phone within reach;with chair alarm set;with SCD's reapplied Nurse Communication: Mobility status PT Visit Diagnosis: Other symptoms and signs involving the nervous system (R29.898);Muscle weakness (generalized) (M62.81);Hemiplegia and hemiparesis Hemiplegia - Right/Left: Left Hemiplegia - caused by: Nontraumatic intracerebral hemorrhage     Time: 0918-0959 PT Time Calculation (min) (ACUTE ONLY): 41 min  Charges:  $Therapeutic Activity: 23-37 mins                     [name], PTA Acute Rehabilitation Services Pager: [phone] Office: [phone]     Darliss Cheney 08/19/2019, 1:07 PM

## 2019-08-19 NOTE — Progress Notes (Signed)
PROGRESS NOTE    Haley Hernandez  EBR:[id number] DOB: 03-11-1961 DOA: 07/18/2019 PCP: No primary care provider on file.   Brief Narrative:  Haley Hernandez a 59 y.o.femalewith past medical history of intracerebral hemorrhage 7 years ago, hypertension presents to the emergency department after family found her unresponsive.  Last known normal 7:45 PM,family found her unresponsive. EMS was called and patient noted to be plegic on the left side and gaze deviation to the right. Blood pressure was 088 systolic on presentation.  When patient arrived to [facility] ER,she was immediately taken for stat CT head which demonstrated a large right thalamic hemorrhage with intraventricular extension and early hydrocephalus.She was emergently intubated for airway protection. Labetalol and Cleviprex was initiated for blood pressure control,initial delay as patient needed to beintubated.Neurosurgery was consulted for EVD placement.  Admitted to neurology ICU on 3/15 for ICH status post tracheostomy and PEG Neurology transfer to [facility] on April 3.  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 Both have been discontinued. ENT was consulted as tongue continued to protrude 4-5 cm past the teeth and 2 cm laterally this morning. Their recommendation is steroids and use of a "Bite Block" manufactured out of tongue depressor guaze and tape. I have communicated this to nursing, but they tongue is so swollen, that they have been unable to fit the bite block in her mouth. She is receiving iV solumedrol. The patient was seen by Dr. Erlinda Hong from neurology today. He had recommended to the family that oral surgery be consulted to remove all of the patient's teeth, so that she wouldn't bite down on her tongue. I have discussed this with Dr. Erlinda Hong and the patient's daughter. I feel it would be prudent to continue steroids and allow them to reduce swelling rather than  permanently remove teeth for a temporary problem. I also feel that it would be quite traumatic and difficult to remove teeth in such a crowded environment. They have expressed understanding. The swelling is slowly improving with IV steroids, ice, benadryl and Pepcid.  On 08/11/2019 and 08/13/2019 the patients telemetry appeared to display ST segment elevation. Follow up EKG on 08/11/2019 demonstrated a repolarization abnormality. On 08/13/2019 the patient's EKG failed to demonstrate the ST segment elevation and troponins were negative.    Assessment & Plan:   Active Problems:   IVH (intraventricular hemorrhage) (HCC)   Acute respiratory failure with hypoxemia (HCC)   Hypokalemia   Hypophosphatemia   Fever   Palliative care by specialist   Goals of care, counseling/discussion   Full code status   Tongue swelling  Fever work-up (feverx1on 4/3 am): She was treated for pneumonia from March 16 to March 20 is with Rocephin and metronidazole. She was treated with cefepime from March 20 is to March 24 when she had EVD. She was treated with Levaquin from March 27 to March 31 for HCAP. Repeat UA/urine culture on April 3 demonstrating only insignificant growth. Blood cultures x 2 collected on that date as well as 3/24, 3/26 have had no growth. Repeat tracheal aspirates from 08/06/2019 have had no growth.  Gram stain has demonstrated only normal respiratory flora. 4/3 CXR demonstrated minimal left basilar atelectasis is noted but improved from the prior study.   Angioedema: ->>> Resolving. Wean steroids, benadryl and pepcid. ENT consulted upon onset. I appreciate Dr. Janace Hoard' input. Bite block was ordered and discussed with nursing to prevent the patient from biting her tongue. However, the patient's tongue was so swollen  that block could not be inserted. She has been started on IV solumedrol. It is being weaned. Benadryl has been stopped. Amlodipine and losartan have been discontinued. Airway intact due to  pre-existing tracheostomy.   Decreased mentation: Daughter reports patient was awake before hospitalization.. More awake in the last 2 days. Per consultants patient with worsening mental status since yesterday. Palliative care is following. They will meet with the family this morning. Sedating medications have been stopped.  Hemorrhagic stroke from hypertensive urgency/ emergency s/p external ventricular drainage (present on admission): She has history of ICH 7 years ago. Neurology following,will follow recommendation.  Acute respiratory failure due to ICH: Now status post tracheostomy on March 27/status post PEG placement on March 3. RT has changed out trach. The patient had coarse upper airway sounds that has been addressed by Robinul IV.  Anemia from acute illness: Hemoglobin on presentation was normal. Currently hemoglobin 10.2. Monitor.  Hypertension: Blood pressure, although improved, still high. Will increase frequency of labetalol to tid.. Improved now. She is now receiving hydralazine 100 mg every 8 hours, chlorthalidone, and labetalol 200 mg tid.   Abnormal telemetry: On 08/11/2019 and 08/13/2019 the patients telemetry appeared to display ST segment elevation. Follow up EKG on 08/11/2019 demonstrated a repolarization abnormality. On 08/13/2019 the patient's EKG failed to demonstrate the ST segment elevation and troponins were negative.   DVT Prophylaxis: SCD's Code Status:full Family Communication:Daughter at bedside. Disposition: Patient is from home. Disposition will be to SNF. Barriers to d/c OR conditions which need to be met to effect a safe d/c:Resolution of severe tongue swelling.Weaning steroids.Need LTAC level of care or skilled nursing facility can take care of trach PEG patient.    Consultants:   ENT  Procedures:  Antimicrobials:  Anti-infectives (From admission, onward)   Start     Dose/Rate Route Frequency Ordered Stop   08/03/19 1442  ceFAZolin (ANCEF) 2-4  GM/100ML-% IVPB    Note to Pharmacy: [name]: cabinet override      08/03/19 1442 08/03/19 1458   07/30/19 0930  levofloxacin (LEVAQUIN) IVPB 500 mg     500 mg 100 mL/hr over 60 Minutes Intravenous Every 24 hours 07/30/19 0915 08/03/19 1101   07/23/19 0900  ceFEPIme (MAXIPIME) 2 g in sodium chloride 0.9 % 100 mL IVPB  Status:  Discontinued     2 g 200 mL/hr over 30 Minutes Intravenous Every 8 hours 07/23/19 0854 07/27/19 1503   07/19/19 1100  cefTRIAXone (ROCEPHIN) 2 g in sodium chloride 0.9 % 100 mL IVPB  Status:  Discontinued     2 g 200 mL/hr over 30 Minutes Intravenous Every 24 hours 07/19/19 1052 07/23/19 0842   07/19/19 1100  metroNIDAZOLE (FLAGYL) IVPB 500 mg     500 mg 100 mL/hr over 60 Minutes Intravenous Every 8 hours 07/19/19 1052 07/24/19 0301       Subjective: Patient was seen and examined at bedside, no overnight events.  Patient is lying comfortably,  tongue swelling has significantly reduced.  She has participated in physical therapy, she has sat down on the side of bed.  Objective: Vitals:   08/19/19 0600 08/19/19 0740 08/19/19 0923 08/19/19 1121  BP:  (!) 147/90  137/84  Pulse: (!) 57 83 74 83  Resp: _0 Temp:  98.7 F (37.1 C)  98.7 F (37.1 C)  TempSrc:  Axillary    SpO2: 95% 93% 98% 93%  Weight:      Height:  Intake/Output Summary (Last 24 hours) at 08/19/2019 1138 Last data filed at 08/19/2019 0600 Gross per 24 hour  Intake 1100 ml  Output 1750 ml  Net -650 ml   Filed Weights   08/17/19 0409 08/18/19 0500 08/19/19 0500  Weight: 76.2 kg 74.3 kg 73.9 kg    Examination:  General exam: Appears calm and comfortable, tongue swelling has reduced.  Tracheostomy tube in place. Respiratory system: Clear to auscultation. Respiratory effort normal. Cardiovascular system: S1 & S2 heard, RRR. No JVD, murmurs, rubs, gallops or clicks. No pedal edema. Gastrointestinal system: Abdomen is nondistended, soft and nontender. No  organomegaly or masses felt. Normal bowel sounds heard. Central nervous system: Alert and oriented. No focal neurological deficits. Extremities: not examined. Skin: No rashes, lesions or ulcers Psychiatry: not examined.    Data Reviewed: I have personally reviewed following labs and imaging studies  CBC: Recent Labs  Lab 08/16/19 0430 08/19/19 0600  WBC 15.9* 10.9*  NEUTROABS 14.5*  --   HGB 10.2* 10.0*  HCT 32.0* 31.3*  MCV 87.9 87.7  PLT 269 284   Basic Metabolic Panel: Recent Labs  Lab 08/16/19 0430 08/19/19 0600  NA 137 135  K 3.7 3.7  CL 95* 93*  CO2 33* 34*  GLUCOSE 207* 130*  BUN 40* 31*  CREATININE 0.68 0.58  CALCIUM 10.8* 10.7*  MG  --  1.9  PHOS  --  3.2   GFR: Estimated Creatinine Clearance: 69.7 mL/min (by C-G formula based on SCr of 0.58 mg/dL). Liver Function Tests: No results for input(s): AST, ALT, ALKPHOS, BILITOT, PROT, ALBUMIN in the last 168 hours. No results for input(s): LIPASE, AMYLASE in the last 168 hours. No results for input(s): AMMONIA in the last 168 hours. Coagulation Profile: No results for input(s): INR, PROTIME in the last 168 hours. Cardiac Enzymes: No results for input(s): CKTOTAL, CKMB, CKMBINDEX, TROPONINI in the last 168 hours. BNP (last 3 results) No results for input(s): PROBNP in the last 8760 hours. HbA1C: No results for input(s): HGBA1C in the last 72 hours. CBG: Recent Labs  Lab 08/18/19 1938 08/19/19 0012 08/19/19 0425 08/19/19 0739 08/19/19 1127  GLUCAP 215* 151* 107* 126* 92   Lipid Profile: No results for input(s): CHOL, HDL, LDLCALC, TRIG, CHOLHDL, LDLDIRECT in the last 72 hours. Thyroid Function Tests: No results for input(s): TSH, T4TOTAL, FREET4, T3FREE, THYROIDAB in the last 72 hours. Anemia Panel: No results for input(s): VITAMINB12, FOLATE, FERRITIN, TIBC, IRON, RETICCTPCT in the last 72 hours. Sepsis Labs: No results for input(s): PROCALCITON, LATICACIDVEN in the last 168 hours.  No results  found for this or any previous visit (from the past 240 hour(s)).   Radiology Studies: No results found.   Sheduled Meds: . chlorhexidine gluconate (MEDLINE KIT)  15 mL Mouth Rinse BID  . Chlorhexidine Gluconate Cloth  6 each Topical Daily  . chlorthalidone  12.5 mg Per Tube Daily  . famotidine  20 mg Per Tube BID  . free water  140 mL Per Tube Q6H  . guaiFENesin  10 mL Per Tube BID  . hydrALAZINE  100 mg Per Tube Q8H  . insulin aspart  0-20 Units Subcutaneous Q4H  . insulin aspart  7 Units Subcutaneous Q4H  . insulin glargine  24 Units Subcutaneous Daily  . labetalol  200 mg Per Tube TID  . magic mouthwash w/lidocaine  5 mL Oral TID  . mouth rinse  15 mL Mouth Rinse 10 times per day  . methylPREDNISolone (SOLU-MEDROL) injection  40 mg  Intravenous Daily  . sodium chloride flush  10-40 mL Intracatheter Q12H  . triamcinolone   Mouth/Throat TID   Continuous Infusions: . feeding supplement (JEVITY 1.2 CAL) 1,000 mL (08/19/19 0554)     LOS: 32 days    Time spent: 25 mins.    Shawna Clamp, MD [facility]   If 7PM-7AM, please contact night-coverage

## 2019-08-19 NOTE — TOC Progression Note (Signed)
Transition of Care [facility]) - Progression Note    Patient Details  Name: [name] MRN: [id number] Date of Birth: Jan 05, 1961  [facility] [place] CM/SW Contact  Kermit Balo, RN Phone Number: 08/19/2019, 3:23 PM  Clinical Narrative:    [facility] SNF has denied. Daughter is aware. Family to tour [facility] today and review the SNF offers. TOC following.     Barriers to Discharge: Continued Medical Work up, English as a second language teacher, Inadequate or no Clinical research associate and Services       Post Acute Care Choice: Long Term Acute Care (LTAC), Skilled Nursing Facility Living arrangements for the past 2 months: Single Family Home                                       Social Determinants of Health (SDOH) Interventions    Readmission Risk Interventions No flowsheet data found.

## 2019-08-19 NOTE — Progress Notes (Signed)
Occupational Therapy Treatment Patient Details Name: Haley Hernandez MRN: [id number] DOB: December 21, 1960 Today's Date: 08/19/2019    History of present illness 59 y.o. female with past medical history of intracerebral hemorrhage 7 years ago, hypertension presents to the emergency department after family found her unresponsive with L hemipleiga and R gaze deviation. 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 Pt intubated and with EVD placed on 3/15. EVD removed 3/24. tracheostomy 3/27; EEG 3/29 moderate diffuse encephalopathy with no seizures (multiple events of RUE twitching occurred during EEG without EEG changes)   OT comments  Pt with stable VS throughout session and strong, productive cough, but continues to be unable to manage saliva. Pt reaching to wipe her mouth and following commands with a simple motor response. Focus of session on strengthening trunk, shoulder girdle and on head control/cervical ROM. Sat EOB x 20 minutes with mod assist and second person for safety. Will continue to follow.  Follow Up Recommendations  SNF;Supervision/Assistance - 24 hour    Equipment Recommendations  Hospital bed(hoyer lift)    Recommendations for Other Services      Precautions / Restrictions Precautions Precautions: Fall       Mobility Bed Mobility Overal bed mobility: Needs Assistance Bed Mobility: Rolling;Supine to Sit;Sit to Supine Rolling: +2 for physical assistance;Total assist   Supine to sit: +2 for physical assistance;Total assist Sit to supine: +2 for physical assistance;Total assist   General bed mobility comments: Pt cued and assisted to turn head toward side when rolling and to flex opposite hip, worked on reaching with R hand to rail, maintaining sidelying  Transfers                      Balance Overall balance assessment: Needs assistance Sitting-balance support: Bilateral upper extremity supported;Feet  unsupported Sitting balance-[name]: Poor Sitting balance - Comments: mod assist, second person for safety                                   ADL either performed or assessed with clinical judgement   ADL                                               Vision   Additional Comments: R eye with down and outward fixed gaze, L with downward fixed gaze   Perception     Praxis      Cognition Arousal/Alertness: Awake/alert Behavior During Therapy: Flat affect Overall Cognitive Status: Difficult to assess                                 General Comments: pt following simple commands with increased time, "squeeze my hand," "wipe your mouth", "squeeze your eyes closed"        Exercises     Shoulder Instructions       General Comments      Pertinent Vitals/ Pain       Pain Assessment: Faces Faces Pain Scale: No hurt  Home Living  Prior Functioning/Environment              Frequency  Min 2X/week        Progress Toward Goals  OT Goals(current goals can now be found in the care plan section)  Progress towards OT goals: Progressing toward goals  Acute Rehab OT Goals Patient Stated Goal: Pt unable to participate in goal setting, to improve strength and reduce caregiver burden OT Goal Formulation: Patient unable to participate in goal setting Time For Goal Achievement: 08/23/19 Potential to Achieve Goals: Fair  Plan Discharge plan remains appropriate    Co-evaluation    PT/OT/SLP Co-Evaluation/Treatment: Yes Reason for Co-Treatment: Complexity of the patient's impairments (multi-system involvement)   OT goals addressed during session: Strengthening/ROM      AM-PAC OT "6 Clicks" Daily Activity     Outcome Measure   Help from another person eating meals?: Total Help from another person taking care of personal grooming?: Total Help from another  person toileting, which includes using toliet, bedpan, or urinal?: Total Help from another person bathing (including washing, rinsing, drying)?: Total Help from another person to put on and taking off regular upper body clothing?: Total Help from another person to put on and taking off regular lower body clothing?: Total 6 Click Score: 6    End of Session Equipment Utilized During Treatment: Oxygen  OT Visit Diagnosis: Unsteadiness on feet (R26.81);Other abnormalities of gait and mobility (R26.89);Muscle weakness (generalized) (M62.81);Pain;Low vision, both eyes (H54.2)   Activity Tolerance Patient tolerated treatment well   Patient Left in bed;with call bell/phone within reach;with SCD's reapplied;with bed alarm set   Nurse Communication          Time: 0626-9485 OT Time Calculation (min): 40 min  Charges: OT General Charges $OT Visit: 1 Visit OT Treatments $Neuromuscular Re-education: 8-22 mins  Martie Round, OTR/L Acute Rehabilitation Services Pager: [phone] Office: [phone]   Evern Bio 08/19/2019, 11:04 AM

## 2019-08-20 LAB — GLUCOSE, CAPILLARY
Glucose-Capillary: 127 mg/dL — ABNORMAL HIGH (ref 70–99)
Glucose-Capillary: 114 mg/dL — ABNORMAL HIGH (ref 70–99)
Glucose-Capillary: 164 mg/dL — ABNORMAL HIGH (ref 70–99)
Glucose-Capillary: 172 mg/dL — ABNORMAL HIGH (ref 70–99)
Glucose-Capillary: 155 mg/dL — ABNORMAL HIGH (ref 70–99)
Glucose-Capillary: 105 mg/dL — ABNORMAL HIGH (ref 70–99)

## 2019-08-20 NOTE — Progress Notes (Signed)
PROGRESS NOTE    Haley Hernandez  VZD:[id number] DOB: [birth year] DOA: 07/18/2019 PCP: No primary care provider on file.   Brief Narrative:  Haley Hernandez a 60 y.o.femalewith past medical history of intracerebral hemorrhage 7 years ago, hypertension presents to the emergency department after family found her unresponsive.  Last known normal 7:45 PM,family found her unresponsive. EMS was called and patient noted to be plegic on the left side and gaze deviation to the right. Blood pressure was 295 systolic on presentation.  When patient arrived to [facility] ER,she was immediately taken for stat CT head which demonstrated a large right thalamic hemorrhage with intraventricular extension and early hydrocephalus.She was emergently intubated for airway protection. Labetalol and Cleviprex was initiated for blood pressure control,initial delay as patient needed to beintubated.Neurosurgery was consulted for EVD placement.  Admitted to neurology ICU on 3/15 for ICH status post tracheostomy and PEG Neurology transfer to [facility] on April 3.  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 Both have been discontinued. ENT was consulted as tongue continued to protrude 4-5 cm past the teeth and 2 cm laterally this morning. Their recommendation is steroids and use of a "Bite Block" manufactured out of tongue depressor guaze and tape. I have communicated this to nursing, but they tongue is so swollen, that they have been unable to fit the bite block in her mouth. She is receiving iV solumedrol. The patient was seen by Dr. Erlinda Hong from neurology today. He had recommended to the family that oral surgery be consulted to remove all of the patient's teeth, so that she wouldn't bite down on her tongue. I have discussed this with Dr. Erlinda Hong and the patient's daughter. I feel it would be prudent to continue steroids and allow them to reduce swelling rather than  permanently remove teeth for a temporary problem. I also feel that it would be quite traumatic and difficult to remove teeth in such a crowded environment. They have expressed understanding. The swelling is slowly improving with IV steroids, ice, benadryl and Pepcid.  On 08/11/2019 and 08/13/2019 the patients telemetry appeared to display ST segment elevation. Follow up EKG on 08/11/2019 demonstrated a repolarization abnormality. On 08/13/2019 the patient's EKG failed to demonstrate the ST segment elevation and troponins were negative.    Assessment & Plan:   Active Problems:   IVH (intraventricular hemorrhage) (HCC)   Acute respiratory failure with hypoxemia (HCC)   Hypokalemia   Hypophosphatemia   Fever   Palliative care by specialist   Goals of care, counseling/discussion   Full code status   Tongue swelling  Fever work-up (feverx1on 4/3 am): She was treated for pneumonia from March 16 to March 20 is with Rocephin and metronidazole. She was treated with cefepime from March 20 is to March 24 when she had EVD. She was treated with Levaquin from March 27 to March 31 for HCAP. Repeat UA/urine culture on April 3 demonstrating only insignificant growth. Blood cultures x 2 collected on that date as well as 3/24, 3/26 have had no growth. Repeat tracheal aspirates from 08/06/2019 have had no growth.  Gram stain has demonstrated only normal respiratory flora. 4/3 CXR demonstrated minimal left basilar atelectasis is noted but improved from the prior study.   Angioedema: ->>> Resolving. Wean steroids, benadryl and pepcid. ENT consulted upon onset. I appreciate Dr. Janace Hoard' input. Bite block was ordered and discussed with nursing to prevent the patient from biting her tongue. However, the patient's tongue was so swollen  that block could not be inserted. She has been started on IV solumedrol. It is being weaned. Benadryl has been stopped. Amlodipine and losartan have been discontinued. Airway intact due to  pre-existing tracheostomy.   Decreased mentation: Daughter reports patient was awake before hospitalization.. More awake in the last 2 days. Per consultants patient with worsening mental status since yesterday. Palliative care is following. They will meet with the family this morning. Sedating medications have been stopped.  Hemorrhagic stroke from hypertensive urgency/ emergency s/p external ventricular drainage (present on admission): She has history of ICH 7 years ago. Neurology following,will follow recommendation.  Acute respiratory failure due to ICH: Now status post tracheostomy on March 27/status post PEG placement on March 3. RT has changed out trach. The patient had coarse upper airway sounds that has been addressed by Robinul IV.  Anemia from acute illness: Hemoglobin on presentation was normal. Currently hemoglobin 10.2. Monitor.  Hypertension: Blood pressure, although improved, still high. Will increase frequency of labetalol to tid.. Improved now. She is now receiving hydralazine 100 mg every 8 hours, chlorthalidone, and labetalol 200 mg tid.   Abnormal telemetry: On 08/11/2019 and 08/13/2019 the patients telemetry appeared to display ST segment elevation. Follow up EKG on 08/11/2019 demonstrated a repolarization abnormality. On 08/13/2019 the patient's EKG failed to demonstrate the ST segment elevation and troponins were negative.   DVT Prophylaxis: SCD's Code Status:full Family Communication:Daughter at bedside. Disposition: Patient is from home. Disposition will be to SNF. Barriers to d/c OR conditions which need to be met to effect a safe d/c:Resolution of severe tongue swelling.Weaning steroids.Need LTAC level of care or skilled nursing facility can take care of trach PEG patient.    Consultants:   ENT  Procedures:  Antimicrobials:  Anti-infectives (From admission, onward)   Start     Dose/Rate Route Frequency Ordered Stop   08/03/19 1442  ceFAZolin (ANCEF) 2-4  GM/100ML-% IVPB    Note to Pharmacy: [name]: cabinet override      08/03/19 1442 08/03/19 1458   07/30/19 0930  levofloxacin (LEVAQUIN) IVPB 500 mg     500 mg 100 mL/hr over 60 Minutes Intravenous Every 24 hours 07/30/19 0915 08/03/19 1101   07/23/19 0900  ceFEPIme (MAXIPIME) 2 g in sodium chloride 0.9 % 100 mL IVPB  Status:  Discontinued     2 g 200 mL/hr over 30 Minutes Intravenous Every 8 hours 07/23/19 0854 07/27/19 1503   07/19/19 1100  cefTRIAXone (ROCEPHIN) 2 g in sodium chloride 0.9 % 100 mL IVPB  Status:  Discontinued     2 g 200 mL/hr over 30 Minutes Intravenous Every 24 hours 07/19/19 1052 07/23/19 0842   07/19/19 1100  metroNIDAZOLE (FLAGYL) IVPB 500 mg     500 mg 100 mL/hr over 60 Minutes Intravenous Every 8 hours 07/19/19 1052 07/24/19 0301       Subjective: Patient was seen and examined at bedside, no overnight events.  Patient is lying comfortably,  tongue swelling has significantly reduced.  She has participated in physical therapy yesterday.   Objective: Vitals:   08/20/19 0407 08/20/19 0745 08/20/19 0821 08/20/19 1123  BP: 126/81 102/75    Pulse: 85 81 88 76  Resp: '18 14 19 14  ' Temp: 98.6 F (37 C) 98.6 F (37 C)    TempSrc: Oral Axillary    SpO2: 95% 97% 94% 94%  Weight:      Height:        Intake/Output Summary (Last 24 hours) at 08/20/2019 1135  Last data filed at 08/20/2019 0740 Gross per 24 hour  Intake 100 ml  Output 2150 ml  Net -2050 ml   Filed Weights   08/17/19 0409 08/18/19 0500 08/19/19 0500  Weight: 76.2 kg 74.3 kg 73.9 kg    Examination:  General exam: Appears calm and comfortable, tongue swelling has reduced.  Tracheostomy tube in place. Respiratory system: Clear to auscultation. Respiratory effort normal. Cardiovascular system: S1 & S2 heard, RRR. No JVD, murmurs, rubs, gallops or clicks. No pedal edema. Gastrointestinal system: Abdomen is nondistended, soft and nontender. No organomegaly or masses felt. Normal bowel  sounds heard. Central nervous system: Alert and oriented. No focal neurological deficits. Extremities: not examined. Skin: No rashes, lesions or ulcers Psychiatry: not examined.    Data Reviewed: I have personally reviewed following labs and imaging studies  CBC: Recent Labs  Lab 08/16/19 0430 08/19/19 0600  WBC 15.9* 10.9*  NEUTROABS 14.5*  --   HGB 10.2* 10.0*  HCT 32.0* 31.3*  MCV 87.9 87.7  PLT 269 542   Basic Metabolic Panel: Recent Labs  Lab 08/16/19 0430 08/19/19 0600  NA 137 135  K 3.7 3.7  CL 95* 93*  CO2 33* 34*  GLUCOSE 207* 130*  BUN 40* 31*  CREATININE 0.68 0.58  CALCIUM 10.8* 10.7*  MG  --  1.9  PHOS  --  3.2   GFR: Estimated Creatinine Clearance: 69.7 mL/min (by C-G formula based on SCr of 0.58 mg/dL). Liver Function Tests: No results for input(s): AST, ALT, ALKPHOS, BILITOT, PROT, ALBUMIN in the last 168 hours. No results for input(s): LIPASE, AMYLASE in the last 168 hours. No results for input(s): AMMONIA in the last 168 hours. Coagulation Profile: No results for input(s): INR, PROTIME in the last 168 hours. Cardiac Enzymes: No results for input(s): CKTOTAL, CKMB, CKMBINDEX, TROPONINI in the last 168 hours. BNP (last 3 results) No results for input(s): PROBNP in the last 8760 hours. HbA1C: No results for input(s): HGBA1C in the last 72 hours. CBG: Recent Labs  Lab 08/19/19 1612 08/19/19 1958 08/19/19 2348 08/20/19 0404 08/20/19 0832  GLUCAP 147* 162* 122* 127* 114*   Lipid Profile: No results for input(s): CHOL, HDL, LDLCALC, TRIG, CHOLHDL, LDLDIRECT in the last 72 hours. Thyroid Function Tests: No results for input(s): TSH, T4TOTAL, FREET4, T3FREE, THYROIDAB in the last 72 hours. Anemia Panel: No results for input(s): VITAMINB12, FOLATE, FERRITIN, TIBC, IRON, RETICCTPCT in the last 72 hours. Sepsis Labs: No results for input(s): PROCALCITON, LATICACIDVEN in the last 168 hours.  No results found for this or any previous visit  (from the past 240 hour(s)).   Radiology Studies: No results found.   Sheduled Meds:  chlorhexidine gluconate (MEDLINE KIT)  15 mL Mouth Rinse BID   Chlorhexidine Gluconate Cloth  6 each Topical Daily   chlorthalidone  12.5 mg Per Tube Daily   famotidine  20 mg Per Tube BID   free water  140 mL Per Tube Q6H   guaiFENesin  10 mL Per Tube BID   hydrALAZINE  100 mg Per Tube Q8H   insulin aspart  0-20 Units Subcutaneous Q4H   insulin aspart  7 Units Subcutaneous Q4H   insulin glargine  24 Units Subcutaneous Daily   labetalol  200 mg Per Tube TID   magic mouthwash w/lidocaine  5 mL Oral TID   mouth rinse  15 mL Mouth Rinse 10 times per day   methylPREDNISolone (SOLU-MEDROL) injection  40 mg Intravenous Daily   sodium chloride flush  10-40 mL Intracatheter Q12H   triamcinolone   Mouth/Throat TID   Continuous Infusions:  feeding supplement (JEVITY 1.2 CAL) 1,000 mL (08/19/19 2243)     LOS: 33 days    Time spent: 25 mins.    Shawna Clamp, MD [facility]   If 7PM-7AM, please contact night-coverage

## 2019-08-20 NOTE — Progress Notes (Addendum)
Telemetry notified of ST elevation (3.7) in lead 5.  Notified Provider Dr. Casper Harrison, MD who gave verbal order for STAT EKG.  Notified oncoming nurse Michele Mcalpine, RN and [place], NT of new order.

## 2019-08-21 LAB — GLUCOSE, CAPILLARY
Glucose-Capillary: 109 mg/dL — ABNORMAL HIGH (ref 70–99)
Glucose-Capillary: 106 mg/dL — ABNORMAL HIGH (ref 70–99)
Glucose-Capillary: 128 mg/dL — ABNORMAL HIGH (ref 70–99)
Glucose-Capillary: 138 mg/dL — ABNORMAL HIGH (ref 70–99)
Glucose-Capillary: 178 mg/dL — ABNORMAL HIGH (ref 70–99)
Glucose-Capillary: 125 mg/dL — ABNORMAL HIGH (ref 70–99)

## 2019-08-21 NOTE — Progress Notes (Signed)
PROGRESS NOTE    Haley Hernandez  EBR:[id number] DOB: 03-11-1961 DOA: 07/18/2019 PCP: No primary care provider on file.   Brief Narrative:  Haley Hernandez a 59 y.o.femalewith past medical history of intracerebral hemorrhage 7 years ago, hypertension presents to the emergency department after family found her unresponsive.  Last known normal 7:45 PM,family found her unresponsive. EMS was called and patient noted to be plegic on the left side and gaze deviation to the right. Blood pressure was 088 systolic on presentation.  When patient arrived to [facility] ER,she was immediately taken for stat CT head which demonstrated a large right thalamic hemorrhage with intraventricular extension and early hydrocephalus.She was emergently intubated for airway protection. Labetalol and Cleviprex was initiated for blood pressure control,initial delay as patient needed to beintubated.Neurosurgery was consulted for EVD placement.  Admitted to neurology ICU on 3/15 for ICH status post tracheostomy and PEG Neurology transfer to [facility] on April 3.  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 Both have been discontinued. ENT was consulted as tongue continued to protrude 4-5 cm past the teeth and 2 cm laterally this morning. Their recommendation is steroids and use of a "Bite Block" manufactured out of tongue depressor guaze and tape. I have communicated this to nursing, but they tongue is so swollen, that they have been unable to fit the bite block in her mouth. She is receiving iV solumedrol. The patient was seen by Dr. Erlinda Hong from neurology today. He had recommended to the family that oral surgery be consulted to remove all of the patient's teeth, so that she wouldn't bite down on her tongue. I have discussed this with Dr. Erlinda Hong and the patient's daughter. I feel it would be prudent to continue steroids and allow them to reduce swelling rather than  permanently remove teeth for a temporary problem. I also feel that it would be quite traumatic and difficult to remove teeth in such a crowded environment. They have expressed understanding. The swelling is slowly improving with IV steroids, ice, benadryl and Pepcid.  On 08/11/2019 and 08/13/2019 the patients telemetry appeared to display ST segment elevation. Follow up EKG on 08/11/2019 demonstrated a repolarization abnormality. On 08/13/2019 the patient's EKG failed to demonstrate the ST segment elevation and troponins were negative.    Assessment & Plan:   Active Problems:   IVH (intraventricular hemorrhage) (HCC)   Acute respiratory failure with hypoxemia (HCC)   Hypokalemia   Hypophosphatemia   Fever   Palliative care by specialist   Goals of care, counseling/discussion   Full code status   Tongue swelling  Fever work-up (feverx1on 4/3 am): She was treated for pneumonia from March 16 to March 20 is with Rocephin and metronidazole. She was treated with cefepime from March 20 is to March 24 when she had EVD. She was treated with Levaquin from March 27 to March 31 for HCAP. Repeat UA/urine culture on April 3 demonstrating only insignificant growth. Blood cultures x 2 collected on that date as well as 3/24, 3/26 have had no growth. Repeat tracheal aspirates from 08/06/2019 have had no growth.  Gram stain has demonstrated only normal respiratory flora. 4/3 CXR demonstrated minimal left basilar atelectasis is noted but improved from the prior study.   Angioedema: ->>> Resolving. Wean steroids, benadryl and pepcid. ENT consulted upon onset. I appreciate Dr. Janace Hoard' input. Bite block was ordered and discussed with nursing to prevent the patient from biting her tongue. However, the patient's tongue was so swollen  that block could not be inserted. She has been started on IV solumedrol. It is being weaned. Benadryl has been stopped. Amlodipine and losartan have been discontinued. Airway intact due to  pre-existing tracheostomy.   Decreased mentation: Daughter reports patient was awake before hospitalization.. More awake in the last 2 days. Per consultants patient with worsening mental status since yesterday. Palliative care is following. They will meet with the family this morning. Sedating medications have been stopped.  Hemorrhagic stroke from hypertensive urgency/ emergency s/p external ventricular drainage (present on admission): She has history of ICH 7 years ago. Neurology following,will follow recommendation.  Acute respiratory failure due to ICH: Now status post tracheostomy on March 27/status post PEG placement on March 3. RT has changed out trach. The patient had coarse upper airway sounds that has been addressed by Robinul IV.  Anemia from acute illness: Hemoglobin on presentation was normal. Currently hemoglobin 10.2. Monitor.  Hypertension: Blood pressure, although improved, still high. Will increase frequency of labetalol to tid.. Improved now. She is now receiving hydralazine 100 mg every 8 hours, chlorthalidone, and labetalol 200 mg tid.   Abnormal telemetry: On 08/11/2019 and 08/13/2019 the patients telemetry appeared to display ST segment elevation. Follow up EKG on 08/11/2019 demonstrated a repolarization abnormality. On 08/13/2019 the patient's EKG failed to demonstrate the ST segment elevation and troponins were negative.   DVT Prophylaxis: SCD's Code Status:full Family Communication:Daughter at bedside. Disposition: Patient is from home. Disposition will be to SNF.  Barriers to d/c OR conditions which need to be met to effect a safe d/c:Resolution of severe tongue swelling.Weaning steroids.Need LTAC level of care or skilled nursing facility can take care of trach PEG patient.   Consultants:   ENT  Procedures:  Antimicrobials:  Anti-infectives (From admission, onward)   Start     Dose/Rate Route Frequency Ordered Stop   08/03/19 1442  ceFAZolin (ANCEF) 2-4  GM/100ML-% IVPB    Note to Pharmacy: [name]: cabinet override      08/03/19 1442 08/03/19 1458   07/30/19 0930  levofloxacin (LEVAQUIN) IVPB 500 mg     500 mg 100 mL/hr over 60 Minutes Intravenous Every 24 hours 07/30/19 0915 08/03/19 1101   07/23/19 0900  ceFEPIme (MAXIPIME) 2 g in sodium chloride 0.9 % 100 mL IVPB  Status:  Discontinued     2 g 200 mL/hr over 30 Minutes Intravenous Every 8 hours 07/23/19 0854 07/27/19 1503   07/19/19 1100  cefTRIAXone (ROCEPHIN) 2 g in sodium chloride 0.9 % 100 mL IVPB  Status:  Discontinued     2 g 200 mL/hr over 30 Minutes Intravenous Every 24 hours 07/19/19 1052 07/23/19 0842   07/19/19 1100  metroNIDAZOLE (FLAGYL) IVPB 500 mg     500 mg 100 mL/hr over 60 Minutes Intravenous Every 8 hours 07/19/19 1052 07/24/19 0301       Subjective: Patient was seen and examined at bedside, no overnight events.  Patient is lying comfortably,  tongue swelling has significantly reduced.  She has participated in physical therapy yesterday.   Objective: Vitals:   08/21/19 0100 08/21/19 0312 08/21/19 0426 08/21/19 0754  BP: 94/61 (!) 131/93  116/79  Pulse:  86  97  Resp:  20  20  Temp:  98.8 F (37.1 C)  98 F (36.7 C)  TempSrc:  Oral  Axillary  SpO2: 100% 99% 100% 93%  Weight:      Height:        Intake/Output Summary (Last 24 hours) at 08/21/2019  1147 Last data filed at 08/21/2019 0300 Gross per 24 hour  Intake 200 ml  Output 3200 ml  Net -3000 ml   Filed Weights   08/17/19 0409 08/18/19 0500 08/19/19 0500  Weight: 76.2 kg 74.3 kg 73.9 kg    Examination:  General exam: Appears calm and comfortable, tongue swelling has reduced.  Tracheostomy tube in place. Respiratory system: Clear to auscultation. Respiratory effort normal. Cardiovascular system: S1 & S2 heard, RRR. No JVD, murmurs, rubs, gallops or clicks. No pedal edema. Gastrointestinal system: Abdomen is nondistended, soft and nontender. No organomegaly or masses felt. Normal  bowel sounds heard. Central nervous system: Alert and oriented. No focal neurological deficits. Extremities: not examined. Skin: No rashes, lesions or ulcers Psychiatry: not examined.    Data Reviewed: I have personally reviewed following labs and imaging studies  CBC: Recent Labs  Lab 08/16/19 0430 08/19/19 0600  WBC 15.9* 10.9*  NEUTROABS 14.5*  --   HGB 10.2* 10.0*  HCT 32.0* 31.3*  MCV 87.9 87.7  PLT 269 093   Basic Metabolic Panel: Recent Labs  Lab 08/16/19 0430 08/19/19 0600  NA 137 135  K 3.7 3.7  CL 95* 93*  CO2 33* 34*  GLUCOSE 207* 130*  BUN 40* 31*  CREATININE 0.68 0.58  CALCIUM 10.8* 10.7*  MG  --  1.9  PHOS  --  3.2   GFR: Estimated Creatinine Clearance: 69.7 mL/min (by C-G formula based on SCr of 0.58 mg/dL). Liver Function Tests: No results for input(s): AST, ALT, ALKPHOS, BILITOT, PROT, ALBUMIN in the last 168 hours. No results for input(s): LIPASE, AMYLASE in the last 168 hours. No results for input(s): AMMONIA in the last 168 hours. Coagulation Profile: No results for input(s): INR, PROTIME in the last 168 hours. Cardiac Enzymes: No results for input(s): CKTOTAL, CKMB, CKMBINDEX, TROPONINI in the last 168 hours. BNP (last 3 results) No results for input(s): PROBNP in the last 8760 hours. HbA1C: No results for input(s): HGBA1C in the last 72 hours. CBG: Recent Labs  Lab 08/20/19 1704 08/20/19 1941 08/20/19 2318 08/21/19 0313 08/21/19 0823  GLUCAP 172* 155* 105* 109* 106*   Lipid Profile: No results for input(s): CHOL, HDL, LDLCALC, TRIG, CHOLHDL, LDLDIRECT in the last 72 hours. Thyroid Function Tests: No results for input(s): TSH, T4TOTAL, FREET4, T3FREE, THYROIDAB in the last 72 hours. Anemia Panel: No results for input(s): VITAMINB12, FOLATE, FERRITIN, TIBC, IRON, RETICCTPCT in the last 72 hours. Sepsis Labs: No results for input(s): PROCALCITON, LATICACIDVEN in the last 168 hours.  No results found for this or any previous  visit (from the past 240 hour(s)).   Radiology Studies: No results found.   Sheduled Meds: . chlorhexidine gluconate (MEDLINE KIT)  15 mL Mouth Rinse BID  . Chlorhexidine Gluconate Cloth  6 each Topical Daily  . chlorthalidone  12.5 mg Per Tube Daily  . famotidine  20 mg Per Tube BID  . free water  140 mL Per Tube Q6H  . guaiFENesin  10 mL Per Tube BID  . hydrALAZINE  100 mg Per Tube Q8H  . insulin aspart  0-20 Units Subcutaneous Q4H  . insulin aspart  7 Units Subcutaneous Q4H  . insulin glargine  24 Units Subcutaneous Daily  . labetalol  200 mg Per Tube TID  . magic mouthwash w/lidocaine  5 mL Oral TID  . mouth rinse  15 mL Mouth Rinse 10 times per day  . methylPREDNISolone (SOLU-MEDROL) injection  40 mg Intravenous Daily  . sodium chloride flush  10-40 mL Intracatheter Q12H  . triamcinolone   Mouth/Throat TID   Continuous Infusions: . feeding supplement (JEVITY 1.2 CAL) 1,000 mL (08/20/19 1923)     LOS: 34 days    Time spent: 25 mins.   Shawna Clamp, MD [facility]   If 7PM-7AM, please contact night-coverage

## 2019-08-22 LAB — COMPREHENSIVE METABOLIC PANEL
ALT: 35 U/L (ref 0–44)
AST: 17 U/L (ref 15–41)
Albumin: 2.5 g/dL — ABNORMAL LOW (ref 3.5–5.0)
Alkaline Phosphatase: 66 U/L (ref 38–126)
Anion gap: 8 (ref 5–15)
BUN: 23 mg/dL — ABNORMAL HIGH (ref 6–20)
CO2: 32 mmol/L (ref 22–32)
Calcium: 10.2 mg/dL (ref 8.9–10.3)
Chloride: 96 mmol/L — ABNORMAL LOW (ref 98–111)
Creatinine, Ser: 0.51 mg/dL (ref 0.44–1.00)
GFR calc Af Amer: >60 mL/min (ref >60)
GFR calc non Af Amer: >60 mL/min (ref >60)
Glucose, Bld: 144 mg/dL — ABNORMAL HIGH (ref 70–99)
Potassium: 3.9 mmol/L (ref 3.5–5.1)
Sodium: 136 mmol/L (ref 135–145)
Total Bilirubin: 0.7 mg/dL (ref 0.3–1.2)
Total Protein: 5.4 g/dL — ABNORMAL LOW (ref 6.5–8.1)

## 2019-08-22 LAB — GLUCOSE, CAPILLARY
Glucose-Capillary: 118 mg/dL — ABNORMAL HIGH (ref 70–99)
Glucose-Capillary: 127 mg/dL — ABNORMAL HIGH (ref 70–99)
Glucose-Capillary: 161 mg/dL — ABNORMAL HIGH (ref 70–99)
Glucose-Capillary: 107 mg/dL — ABNORMAL HIGH (ref 70–99)
Glucose-Capillary: 175 mg/dL — ABNORMAL HIGH (ref 70–99)
Glucose-Capillary: 201 mg/dL — ABNORMAL HIGH (ref 70–99)
Glucose-Capillary: 117 mg/dL — ABNORMAL HIGH (ref 70–99)

## 2019-08-22 LAB — CBC
HCT: 30.4 % — ABNORMAL LOW (ref 36.0–46.0)
Hemoglobin: 9.5 g/dL — ABNORMAL LOW (ref 12.0–15.0)
MCH: 28.1 pg (ref 26.0–34.0)
MCHC: 31.3 g/dL (ref 30.0–36.0)
MCV: 89.9 fL (ref 80.0–100.0)
Platelets: 173 10*3/uL (ref 150–400)
RBC: 3.38 MIL/uL — ABNORMAL LOW (ref 3.87–5.11)
RDW: 15.3 % (ref 11.5–15.5)
WBC: 11.8 10*3/uL — ABNORMAL HIGH (ref 4.0–10.5)
nRBC: 0 % (ref 0.0–0.2)

## 2019-08-22 LAB — MAGNESIUM: Magnesium: 1.9 mg/dL (ref 1.7–2.4)

## 2019-08-22 LAB — PHOSPHORUS: Phosphorus: 3.1 mg/dL (ref 2.5–4.6)

## 2019-08-22 NOTE — Progress Notes (Signed)
PROGRESS NOTE    Haley Hernandez  SKA:[id number] DOB: November 26, 1960 DOA: 07/18/2019 PCP: No primary care provider on file.   Brief Narrative: 59 year old female with intracerebral hemorrhage 7 years ago hypertension presented to the ED after family found her unresponsive last knOWN NORMAL 7.45 PM PTA.  EMS noticed left-sided weakness and gaze deviation to the right blood pressure in 250s, in the ED CT head showed large right thalamic hemorrhage with intraventricular extension and early hydrocephalus, emergently intubated for airway protection placed on labetalol and Cleviprex for blood pressure management seen by neurosurgery, admitted to neurology service In ICU on 3/15.  Patient is since status post tracheostomy and PEG and transferred to [facility] April 3rd.  Hospital course complicated by leukocytosis and pneumonia/bronchitis, and also developed tongue swelling/macroglossia in April 4-  Felt to be reaction to amlodipine versus losartan, both have been discontinued, seen by ENT, advised steroids, use of "bite block".08/11/2019 and 08/13/2019 the patients telemetry appeared to display ST segment elevation. Follow up EKG on 08/11/2019 demonstrated a repolarization abnormality, troponins have been negative.  Subjective:  ALERT,AWAKE, following commands, able to squeeqe on rt side, left side flaccid TC +, PEG +, TF jevity running Tongue tip protruding- RN who saw her last Thursday reports tongue swelling is significantly better since being on steroid  Assessment & Plan:  Hemorrhagic stroke from hypertensive urgency/emergency status post external ventricular drainage. Has hx of ICH 7 yrs ago.  Seen by neurology continue PT OT supportive care.  Avoid anticoagulation.  Angioedema involving tongue with swelling , seen by ENT, advised bite-block: However patient's tongue was so swollen that bite block could not be inserted, was placed on IV steroids with significant improvement.  Slowly wean the steroid-adjust  insulin as weaning of the steroid. Of Benadryl.  Dependent losartan has been discontinued as a potential etiology.  Nutrrition: on Tube feed and insulin Lantus 25 units and sliding scale q4h. Sugar stable, last hba1c 5.7 on 07/19/19.  Lab Results  Component Value Date   HGBA1C 5.7 (H) 07/19/2019   Recent Labs  Lab 08/21/19 2305 08/22/19 0321 08/22/19 0547 08/22/19 0750 08/22/19 1210  GLUCAP 125* 118* 127* 161* 107*   Hypertension blood pressure well controlled on hydralazine, chlorthalidone and labetalol.  Amlodipine and losartan has been discontinued due to angioedema  Lethargic/decreased mentation: Today patient is much more alert awake conversant.  Continue on supportive care.  Pneumonia/bronchitis, was treated for pneumonia and completed antibiotic courses multiple rounds.    Episode of fever on 4/3: No recurrence. Repeat UA/urine culture on April 3 demonstrating only insignificant growth. Blood cultures x 2 collected on that date as well as 3/24, 3/26 have had no growth.  Acute respiratory failure/history of OSA: patient was intubated to protect airway for ICH, now has tracheostomy in place since March 27 and PEG in place continue supportive care, secretion management  Anemia from acute illness monitor hemoglobin overall stable.  Abnormal telemetry with ST elevation , troponin neg.  Goals of care: Seen by palliative care family wishes to continue full medical care.   DVT prophylaxis:SCD Code Status:FULL Family Communication: plan of care discussed with patient. Status is: Inpatient  Remains inpatient appropriate because:Inpatient level of care appropriate due to severity of illness  Dispo: The patient is from: Home              Anticipated d/c is to: SNF with capability for trach PEG care vs LTAC.              Anticipated d/c  date is: 3 days              Patient currently is not medically stable to d/c. Nutrition: Diet Order    None      Nutrition Problem:  Inadequate oral intake Etiology: inability to eat Signs/Symptoms: NPO status Interventions: Tube feeding, Prostat Body mass index is 31.82 kg/m.  Consultants: ENT, Neuro,PCCM Procedures: 3/15 ETT >> 3/27 Trach 3/28 >> 3/16 PICC >>  Microbiology:see note  Medications: Scheduled Meds: . chlorhexidine gluconate (MEDLINE KIT)  15 mL Mouth Rinse BID  . Chlorhexidine Gluconate Cloth  6 each Topical Daily  . chlorthalidone  12.5 mg Per Tube Daily  . famotidine  20 mg Per Tube BID  . free water  140 mL Per Tube Q6H  . guaiFENesin  10 mL Per Tube BID  . hydrALAZINE  100 mg Per Tube Q8H  . insulin aspart  0-20 Units Subcutaneous Q4H  . insulin aspart  7 Units Subcutaneous Q4H  . insulin glargine  24 Units Subcutaneous Daily  . labetalol  200 mg Per Tube TID  . magic mouthwash w/lidocaine  5 mL Oral TID  . mouth rinse  15 mL Mouth Rinse 10 times per day  . methylPREDNISolone (SOLU-MEDROL) injection  40 mg Intravenous Daily  . sodium chloride flush  10-40 mL Intracatheter Q12H  . triamcinolone   Mouth/Throat TID   Continuous Infusions: . feeding supplement (JEVITY 1.2 CAL) 1,000 mL (08/21/19 1433)    Antimicrobials: Anti-infectives (From admission, onward)   Start     Dose/Rate Route Frequency Ordered Stop   08/03/19 1442  ceFAZolin (ANCEF) 2-4 GM/100ML-% IVPB    Note to Pharmacy: [name]: cabinet override      08/03/19 1442 08/03/19 1458   07/30/19 0930  levofloxacin (LEVAQUIN) IVPB 500 mg     500 mg 100 mL/hr over 60 Minutes Intravenous Every 24 hours 07/30/19 0915 08/03/19 1101   07/23/19 0900  ceFEPIme (MAXIPIME) 2 g in sodium chloride 0.9 % 100 mL IVPB  Status:  Discontinued     2 g 200 mL/hr over 30 Minutes Intravenous Every 8 hours 07/23/19 0854 07/27/19 1503   07/19/19 1100  cefTRIAXone (ROCEPHIN) 2 g in sodium chloride 0.9 % 100 mL IVPB  Status:  Discontinued     2 g 200 mL/hr over 30 Minutes Intravenous Every 24 hours 07/19/19 1052 07/23/19 0842    07/19/19 1100  metroNIDAZOLE (FLAGYL) IVPB 500 mg     500 mg 100 mL/hr over 60 Minutes Intravenous Every 8 hours 07/19/19 1052 07/24/19 0301       Objective: Vitals: Today's Vitals   08/21/19 2252 08/22/19 0318 08/22/19 0722 08/22/19 0842  BP: 108/72 133/86 123/81 103/70  Pulse: 74 87 85 87  Resp: '16 19 19 20  ' Temp: 98.2 F (36.8 C) 98 F (36.7 C)    TempSrc: Axillary Axillary    SpO2: 97% 99% 95% 95%  Weight:      Height:      PainSc:        Intake/Output Summary (Last 24 hours) at 08/22/2019 0917 Last data filed at 08/22/2019 0319 Gross per 24 hour  Intake 200 ml  Output 2150 ml  Net -1950 ml   Filed Weights   08/17/19 0409 08/18/19 0500 08/19/19 0500  Weight: 76.2 kg 74.3 kg 73.9 kg   Weight change:    Intake/Output from previous day: 04/18 0701 - 04/19 0700 In: 400  Out: 2650 [Urine:2650] Intake/Output this shift: No intake/output data recorded.  Examination:  General exam: Alert,awake,follows commands, on TC HEENT:Oral mucosa moist, Ear/Nose WNL grossly, TC +, small protruding tongue+ Respiratory system: bilaterally clear,no wheezing or crackles,no use of accessory muscle, non tender. Cardiovascular system: S1 & S2 +, regular, No JVD. Gastrointestinal system: Abdomen soft, NT,ND, BS+. PEG+, Ext foleY+ Nervous System:Alert, awake, moving RT SIDE SOME, ABEL TO SQUEEZE AND WIGGLE RT SIDED TOES, FLACCID ON LEFT. Extremities: No edema, distal peripheral pulses palpable.  Skin: No rashes,no icterus. MSK: Normal muscle bulk,tone, power  Data Reviewed: I have personally reviewed following labs and imaging studies CBC: Recent Labs  Lab 08/16/19 0430 08/19/19 0600 08/22/19 0905  WBC 15.9* 10.9* 11.8*  NEUTROABS 14.5*  --   --   HGB 10.2* 10.0* 9.5*  HCT 32.0* 31.3* 30.4*  MCV 87.9 87.7 89.9  PLT 269 197 706   Basic Metabolic Panel: Recent Labs  Lab 08/16/19 0430 08/19/19 0600  NA 137 135  K 3.7 3.7  CL 95* 93*  CO2 33* 34*  GLUCOSE 207* 130*   BUN 40* 31*  CREATININE 0.68 0.58  CALCIUM 10.8* 10.7*  MG  --  1.9  PHOS  --  3.2   GFR: Estimated Creatinine Clearance: 69.7 mL/min (by C-G formula based on SCr of 0.58 mg/dL). Liver Function Tests: No results for input(s): AST, ALT, ALKPHOS, BILITOT, PROT, ALBUMIN in the last 168 hours. No results for input(s): LIPASE, AMYLASE in the last 168 hours. No results for input(s): AMMONIA in the last 168 hours. Coagulation Profile: No results for input(s): INR, PROTIME in the last 168 hours. Cardiac Enzymes: No results for input(s): CKTOTAL, CKMB, CKMBINDEX, TROPONINI in the last 168 hours. BNP (last 3 results) No results for input(s): PROBNP in the last 8760 hours. HbA1C: No results for input(s): HGBA1C in the last 72 hours. CBG: Recent Labs  Lab 08/21/19 2017 08/21/19 2305 08/22/19 0321 08/22/19 0547 08/22/19 0750  GLUCAP 178* 125* 118* 127* 161*   Lipid Profile: No results for input(s): CHOL, HDL, LDLCALC, TRIG, CHOLHDL, LDLDIRECT in the last 72 hours. Thyroid Function Tests: No results for input(s): TSH, T4TOTAL, FREET4, T3FREE, THYROIDAB in the last 72 hours. Anemia Panel: No results for input(s): VITAMINB12, FOLATE, FERRITIN, TIBC, IRON, RETICCTPCT in the last 72 hours. Sepsis Labs: No results for input(s): PROCALCITON, LATICACIDVEN in the last 168 hours.  No results found for this or any previous visit (from the past 240 hour(s)).    Radiology Studies: No results found.   LOS: 35 days   Time spent: More than 50% of that time was spent in counseling and/or coordination of care.  Antonieta Pert, MD [facility]  08/22/2019, 9:17 AM

## 2019-08-22 NOTE — TOC Progression Note (Signed)
Transition of Care [facility]) - Progression Note    Patient Details  Name: [name] MRN: [id number] Date of Birth: Sep 22, 1960  Transition of Care [facility]) CM/SW Contact  Kermit Balo, RN Phone Number: 08/22/2019, 4:37 PM  Clinical Narrative:    Currently the family is refusing [facility] and the 2 SNF choices. CM will follow up with some of the pending SNF's in am.      Barriers to Discharge: Continued Medical Work up, English as a second language teacher, Inadequate or no Clinical research associate and Services       Post Acute Care Choice: Long Term Acute Care (LTAC), Skilled Nursing Facility Living arrangements for the past 2 months: Single Family Home                                       Social Determinants of Health (SDOH) Interventions    Readmission Risk Interventions No flowsheet data found.

## 2019-08-22 NOTE — Progress Notes (Signed)
Physical Therapy Treatment Patient Details Name: Haley Hernandez MRN: [id number] DOB: [birth year] Today's Date: 08/22/2019    History of Present Illness 59 y.o. female with past medical history of intracerebral hemorrhage 7 years ago, hypertension presents to the emergency department after family found her unresponsive with L hemipleiga and R gaze deviation. 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 Pt intubated and with EVD placed on 3/15. EVD removed 3/24. tracheostomy 3/27; EEG 3/29 moderate diffuse encephalopathy with no seizures (multiple events of RUE twitching occurred during EEG without EEG changes)    PT Comments    Patient with delayed responses to commands or questions, however noted improved movement of RUE and RLE on command. Patient continues with left neglect as she is unable to turn her eyes or head past midline to the left. Neck ROM performed in sitting with decr left rotation noted. Progress has been slow, however she has progressed.     Follow Up Recommendations  SNF;LTACH;Supervision/Assistance - 24 hour     Equipment Recommendations  Other (comment)    Recommendations for Other Services       Precautions / Restrictions Precautions Precautions: Fall Precaution Comments: SBP<160, HOB>30 degrees Restrictions Weight Bearing Restrictions: No    Mobility  Bed Mobility Overal bed mobility: Needs Assistance Bed Mobility: Rolling;Supine to Sit;Sit to Sidelying Rolling: +2 for physical assistance;Total assist   Supine to sit: +2 for physical assistance;Total assist   Sit to sidelying: Total assist;+2 for physical assistance General bed mobility comments: pt turns head rt when rolling to her rt (when cued); pt cannot turn head (or eyes) past midline when looking left  Transfers                 General transfer comment: not appropriate at this time  Ambulation/Gait                 Stairs              Wheelchair Mobility    Modified Rankin (Stroke Patients Only) Modified Rankin (Stroke Patients Only) Pre-Morbid Rankin Score: (unable to determine at this time) Modified Rankin: Severe disability     Balance Overall balance assessment: Needs assistance Sitting-balance support: Bilateral upper extremity supported;Feet unsupported Sitting balance-[name]: Poor Sitting balance - Comments: mod A +1 for physical assist but +2 for safety; pt unable to right herself when torso moves away from midline, no righting reactions noted; seated trunk rotation and lateral flexion (to rt) Postural control: (varies)                                  Cognition Arousal/Alertness: Awake/alert Behavior During Therapy: Flat affect Overall Cognitive Status: Difficult to assess                                 General Comments: pt following simple commands with increased time, "squeeze my hand," "wipe your mouth"      Exercises Other Exercises Other Exercises: RLE heelslides with passive flexion but active/resisted extension; no movement of left extremities noted    General Comments General comments (skin integrity, edema, etc.): nodding / shaking head to yes/no questions (often with delay ~5 seconds); in sitting also worked on holding her head up and turning head to look to her left      Pertinent Vitals/Pain Pain Assessment: Faces Faces Pain  Scale: No hurt    Home Living                      Prior Function            PT Goals (current goals can now be found in the care plan section) Acute Rehab PT Goals Patient Stated Goal: Pt unable to participate in goal setting, to improve strength and reduce caregiver burden PT Goal Formulation: Patient unable to participate in goal setting Time For Goal Achievement: 09/05/19 Potential to Achieve Goals: Fair Progress towards PT goals: (goals updated based on timeframe)    Frequency    Min  3X/week      PT Plan Current plan remains appropriate    Co-evaluation              AM-PAC PT "6 Clicks" Mobility   Outcome Measure  Help needed turning from your back to your side while in a flat bed without using bedrails?: Total Help needed moving from lying on your back to sitting on the side of a flat bed without using bedrails?: Total Help needed moving to and from a bed to a chair (including a wheelchair)?: Total Help needed standing up from a chair using your arms (e.g., wheelchair or bedside chair)?: Total Help needed to walk in hospital room?: Total Help needed climbing 3-5 steps with a railing? : Total 6 Click Score: 6    End of Session Equipment Utilized During Treatment: Oxygen Activity Tolerance: Patient tolerated treatment well Patient left: in bed;with call bell/phone within reach;with SCD's reapplied;with bed alarm set(chair-like position with towel roll to support head in neutr) Nurse Communication: Mobility status PT Visit Diagnosis: Other symptoms and signs involving the nervous system (R29.898);Muscle weakness (generalized) (M62.81);Hemiplegia and hemiparesis Hemiplegia - Right/Left: Left Hemiplegia - caused by: Nontraumatic intracerebral hemorrhage     Time: 1331-1406 PT Time Calculation (min) (ACUTE ONLY): 35 min  Charges:                         Jerolyn Center, PT Pager [phone]    [name] 08/22/2019, 4:46 PM

## 2019-08-23 DIAGNOSIS — J9611 Chronic respiratory failure with hypoxia: Secondary | ICD-10-CM

## 2019-08-23 LAB — CBC
HCT: 28.8 % — ABNORMAL LOW (ref 36.0–46.0)
Hemoglobin: 9.2 g/dL — ABNORMAL LOW (ref 12.0–15.0)
MCH: 28 pg (ref 26.0–34.0)
MCHC: 31.9 g/dL (ref 30.0–36.0)
MCV: 87.8 fL (ref 80.0–100.0)
Platelets: 168 10*3/uL (ref 150–400)
RBC: 3.28 MIL/uL — ABNORMAL LOW (ref 3.87–5.11)
RDW: 15.1 % (ref 11.5–15.5)
WBC: 9.1 10*3/uL (ref 4.0–10.5)
nRBC: 0 % (ref 0.0–0.2)

## 2019-08-23 LAB — BASIC METABOLIC PANEL
Anion gap: 7 (ref 5–15)
BUN: 21 mg/dL — ABNORMAL HIGH (ref 6–20)
CO2: 32 mmol/L (ref 22–32)
Calcium: 10.2 mg/dL (ref 8.9–10.3)
Chloride: 97 mmol/L — ABNORMAL LOW (ref 98–111)
Creatinine, Ser: 0.49 mg/dL (ref 0.44–1.00)
GFR calc Af Amer: >60 mL/min (ref >60)
GFR calc non Af Amer: >60 mL/min (ref >60)
Glucose, Bld: 106 mg/dL — ABNORMAL HIGH (ref 70–99)
Potassium: 3.7 mmol/L (ref 3.5–5.1)
Sodium: 136 mmol/L (ref 135–145)

## 2019-08-23 LAB — GLUCOSE, CAPILLARY
Glucose-Capillary: 105 mg/dL — ABNORMAL HIGH (ref 70–99)
Glucose-Capillary: 114 mg/dL — ABNORMAL HIGH (ref 70–99)
Glucose-Capillary: 140 mg/dL — ABNORMAL HIGH (ref 70–99)
Glucose-Capillary: 135 mg/dL — ABNORMAL HIGH (ref 70–99)
Glucose-Capillary: 115 mg/dL — ABNORMAL HIGH (ref 70–99)
Glucose-Capillary: 60 mg/dL — ABNORMAL LOW (ref 70–99)

## 2019-08-23 MED ORDER — PREDNISONE 5 MG/ML PO CONC
30.0000 mg | Freq: Every day | ORAL | Status: DC
  Administered 2019-08-23 – 2019-08-26 (×4): 30 mg
  Filled 2019-08-23 (×4): qty 6

## 2019-08-23 MED ORDER — DEXTROSE 50 % IV SOLN
INTRAVENOUS | Status: AC
  Administered 2019-08-23: 25 mL
  Filled 2019-08-23: qty 50

## 2019-08-23 NOTE — Procedures (Signed)
Tracheostomy Change Note  Patient Details:   Name: Haley Hernandez DOB: 06/29/1960 MRN: [id number]    Airway Documentation:  Janina Mayo changed to a #6 Shiley Cuffless per order. Positive color change on ETCO2 detector. Pt tolerated well, no distress noted.    Evaluation  O2 sats: stable throughout Complications: No apparent complications Patient did tolerate procedure well. Bilateral Breath Sounds: Clear, Diminished    Suszanne Conners 08/23/2019, 11:48 AM

## 2019-08-23 NOTE — Progress Notes (Signed)
Nutrition Follow-up  DOCUMENTATION CODES:   Obesity unspecified  INTERVENTION:  Continue Jevity 1.2 @ 60 ml/hr (1440 ml/day)  159m free water flush QID  Provides: 1728 kcal, 80 grams protein, and 1167 ml free water  (17232mfree water total with TF)   NUTRITION DIAGNOSIS:   Inadequate oral intake related to inability to eat as evidenced by NPO status.  Ongoing.  GOAL:   Patient will meet greater than or equal to 90% of their needs  Met with TF.   MONITOR:   TF tolerance  REASON FOR ASSESSMENT:   Ventilator    ASSESSMENT:   Pt with PMH of HTN, OSA, and prior ICH admitted from home with acute intraparenchymal hemorrhage in R thalamus and basal ganglia with IVH extension s/p EVD.  3/24 cortrak tube placed, tip gastric 3/27 trach placed 3/31Cortrak removed,PEG placement 4/4 pt developed macroglossia, currently being treated with bite block and steroids  Discussed pt with RN. Pt remains on trach collar.   Current tube feeding orders: Jevity 1.2 cal @ 6029mr with 140m11mee water QID  UOP: 1,300ml19m hours I/O: +3,542.7ml s11me admit  Medications reviewedand include: Pepcid, Novolog, Lantus, Solu-medrol  Labs reviewed: CBGs:117-105-114  Diet Order:   Diet Order    None      EDUCATION NEEDS:   No education needs have been identified at this time  Skin:  Skin Assessment: Skin Integrity Issues: Skin Integrity Issues:: Other (Comment) Other: laceration under tongue  Last BM:  4/19  Height:   Ht Readings from Last 1 Encounters:  07/25/19 5' (1.524 m)    Weight:   Wt Readings from Last 1 Encounters:  08/23/19 58.6 kg   BMI:  Body mass index is 25.23 kg/m.  Estimated Nutritional Needs:   Kcal:  1650-1850  Protein:  80-100 grams  Fluid:  >1.6 L/day   AmandaLarkin InaRD, LDN RD pager number and weekend/on-call pager number located in [email or web address]

## 2019-08-23 NOTE — TOC Progression Note (Signed)
Transition of Care [facility]) - Progression Note    Patient Details  Name: Haley Hernandez MRN: [id number] Date of Birth: 06-20-1960  Transition of Care [facility]) CM/SW Contact  Kermit Balo, RN Phone Number: 08/23/2019, 4:01 PM  Clinical Narrative:    CM spoke to pts oldest daughter this am on the phone and she reiterated they didn't want any of the facilities that are offering a bed for their mother.  CM has reached out to: [facility], [place], [address] and none of these facilities are in network with her insurance.  CM reached out to [facility] again and they are not going to offer a bed.  CM has updated Jon Gills (younger daughter). CM encouraged them to talk tonight about rehab for their mother. They are also considering home as a potential possibility.  TOC following.     Barriers to Discharge: Continued Medical Work up, English as a second language teacher, Inadequate or no Clinical research associate and Services       Post Acute Care Choice: Long Term Acute Care (LTAC), Skilled Nursing Facility Living arrangements for the past 2 months: Single Family Home                                       Social Determinants of Health (SDOH) Interventions    Readmission Risk Interventions No flowsheet data found.

## 2019-08-23 NOTE — Progress Notes (Addendum)
   NAME:  Haley Hernandez, MRN:  [id number], DOB:  August 16, 1960, LOS: 36 ADMISSION DATE:  07/18/2019, CONSULTATION DATE:  07/19/2019 REFERRING MD:  Dr. Laurence Slate, CHIEF COMPLAINT:  ICH  Brief History   59 yo female found with AMS and Lt hemiplegia, SBP 250 with resulting ICH of Rt thalamus and BG with IVH.  Intubated for airway protection.  Required tracheostomy due to altered mental status and tongue swelling.  Past Medical History  HTN, OSA, ICH  Consults:  NSGY Neurology ENT Palliative care  Procedures:  3/15 ETT >> 3/27 Trach 3/28 >> 3/16 PICC >>   Micro Data:  SARS CoV2 PCR 3/15 >> negative Sputum 3/17 >> negative Sputum 3/20 >> negative Blood 3/20 >> negative Blood, urine 3/26 >>negative Blood 4/03 >>   Interim history/subjective:  No distress  Objective   Blood Pressure 108/71   Pulse 80   Temperature 98.5 F (36.9 C) (Oral)   Respiration 16   Height 5' (1.524 m)   Weight 58.6 kg   Oxygen Saturation 94%   Body Mass Index 25.23 kg/m   I/O last 3 completed shifts: In: -  Out: 2600 [Urine:2600]  Physical exam: General 59 year old black female currently resting in bed she is in no acute distress HEENT normocephalic atraumatic size 6 cuffed tracheostomy in place, the cuff is deflated.  She does make sound with cough suggesting able to pass air volume over vocal cords. Neuro: Awake, left-sided neglect, left-sided hemiparesis Pulmonary: Clear to auscultation without accessory use currently on 28% aerosol trach collar Cardiac regular rate and rhythm Abdomen soft not tender no organomegaly Extremities are warm and dry  Assessment & Plan:   Acute hypoxic respiratory failure 2nd to compromised airway in setting of ICH and macroglossia and angioedema Hx of OSA. Tracheostomy status 3/28.   Discussion No issues from a tracheostomy standpoint.  Macroglossia much improved .  Tongue no longer extruding outside of mouth, but does remain a little edematous.,  She was seen  by SLP back on 4/5, I do not see that she has been seen since.  She has been on steroids now for over 2 weeks, this is recommended by ENT with her tongue swelling improved the skin now be tapered, she is no longer on histamine blocker, losartan and Norvasc were discontinued as this was felt to be contributing factors  Plan Change to cuffless size 6 tracheostomy Reinvolve speech-language pathology Slow taper prednisone to off We will place both losartan and amlodipine on her allergy list We will continue to see her weekly I will see her in the trach clinic post discharge, will have respiratory therapy department set up appointment  Simonne Martinet ACNP-BC [facility] Pulmonary/Critical Care Pager # [phone] OR # [phone] if no answer

## 2019-08-23 NOTE — Progress Notes (Signed)
PROGRESS NOTE    Haley Hernandez  UUE:[id number] DOB: 08-15-1960 DOA: 07/18/2019 PCP: No primary care provider on file.   Brief Narrative: 59 year old female with intracerebral hemorrhage 7 years ago hypertension presented to the ED after family found her unresponsive last knOWN NORMAL 7.45 PM PTA.  EMS noticed left-sided weakness and gaze deviation to the right blood pressure in 250s, in the ED CT head showed large right thalamic hemorrhage with intraventricular extension and early hydrocephalus, emergently intubated for airway protection placed on labetalol and Cleviprex for blood pressure management seen by neurosurgery, admitted to neurology service In ICU on 3/15.  Patient is since status post tracheostomy and PEG and transferred to [facility] April 3rd.  Hospital course complicated by leukocytosis and pneumonia/bronchitis, and also developed tongue swelling/macroglossia in April 4-  Felt to be reaction to amlodipine versus losartan, both have been discontinued, seen by ENT, advised steroids, use of "bite block".08/11/2019 and 08/13/2019 the patients telemetry appeared to display ST segment elevation. Follow up EKG on 08/11/2019 demonstrated a repolarization abnormality, troponins have been negative.  Subjective:  Seen and examined this morning. No acute events overnight.  Afebrile T-max 98.6.  On trach collar at 5 L-but nursing reports trach collar was off and patient was saturating well just on tracheostomy, notify RT to adjust/wean O2. Labs CBC/BMP stable following commands, able to squeeqe on rt side, left side flaccid. TC +, PEG +, TF jevity running Tongue swelling has improved and today is completely within the oral cavity .RN who saw her last Thursday reports tongue swelling is significantly better since being on steroid.  Assessment & Plan:  Hemorrhagic stroke from hypertensive urgency/emergency status post external ventricular drainage. Has hx of ICH 7 yrs ago.  Seen by neurology continue PT OT  supportive care.  Avoid anticoagulation.  Acute respiratory failure/history of OSA: patient was intubated to protect airway for ICH, now has tracheostomy in place since March 27 and PEG in place continue supportive care, secretion management. Doing well without trach collar, changing to cuffless size 6 tracheostomy, PCCM following re-involving speech-language pathology, PCCM seeing weekly-and plans to follow-up in trach clinic post discharge.   Angioedema involving tongue with swelling,seen by ENT, advised bite-block: However patient's tongue was so swollen that bite block could not be inserted, was placed on IV steroids with significant improvement.  Switching to oral prednisone by PCCM. Off Benadryl.Amlodipine/losartan has been discontinued as a potential etiology.  Nutrition: on Tube feed and insulin Lantus 25 units and sliding scale q4h. Sugar stable, last hba1c 5.7 on 07/19/19.  Adjust insulin as steroid is being weaned off Recent Labs  Lab 08/22/19 1939 08/22/19 2309 08/23/19 0331 08/23/19 0744 08/23/19 1218  GLUCAP 201* 117* 105* 114* 140*   Hypertension blood pressure well controlled on hydralazine, chlorthalidone and labetalol.  Amlodipine and losartan has been discontinued due to angioedema.  Continue to monitor closely.  Lethargic/decreased mentation: improved. She has been much more alert awake conversant.  Continue on supportive care.  Last CT head on 4/5 showed continued improvement in the right thalamic hemorrhage, a small amount of ventricular hemorrhage with improvement, mild ventricular enlargement unchanged mild midline shift to the left slightly improved  Pneumonia/bronchitis, was treated for pneumonia and completed antibiotic courses multiple rounds.    Episode of fever on 4/3: No recurrence. Repeat UA/urine culture on April 3 demonstrating only insignificant growth. Blood cultures x 2 collected on that date as well as 3/24, 3/26 have had no growth.  Anemia from acute  illness monitor hemoglobin overall  stable.  Abnormal telemetry with ST elevation , troponin neg.  Goals of care: Seen by palliative care family wishes to continue full medical care.   DVT prophylaxis:SCD Code Status:FULL Family Communication: plan of care discussed with patient. Status is: Inpatient  Remains inpatient appropriate because:Inpatient level of care appropriate due to severity of illness  Dispo: The patient is from: Home              Anticipated d/c is to: SNF with capability for trach PEG care vs LTAC.family refused [facility]              Anticipated d/c date is: 3 days              Patient currently is not medically stable to d/c. Nutrition: Diet Order    None      Nutrition Problem: Inadequate oral intake Etiology: inability to eat Signs/Symptoms: NPO status Interventions: Tube feeding, Prostat Body mass index is 25.23 kg/m.  Consultants: ENT, Neuro,PCCM Procedures: 3/15 ETT >> 3/27 Trach 3/28 >> 3/16 PICC >>  Microbiology:see note  Medications: Scheduled Meds: . chlorhexidine gluconate (MEDLINE KIT)  15 mL Mouth Rinse BID  . Chlorhexidine Gluconate Cloth  6 each Topical Daily  . chlorthalidone  12.5 mg Per Tube Daily  . famotidine  20 mg Per Tube BID  . free water  140 mL Per Tube Q6H  . guaiFENesin  10 mL Per Tube BID  . hydrALAZINE  100 mg Per Tube Q8H  . insulin aspart  0-20 Units Subcutaneous Q4H  . insulin aspart  7 Units Subcutaneous Q4H  . insulin glargine  24 Units Subcutaneous Daily  . labetalol  200 mg Per Tube TID  . magic mouthwash w/lidocaine  5 mL Oral TID  . mouth rinse  15 mL Mouth Rinse 10 times per day  . predniSONE  30 mg Per Tube Daily  . sodium chloride flush  10-40 mL Intracatheter Q12H  . triamcinolone   Mouth/Throat TID   Continuous Infusions: . feeding supplement (JEVITY 1.2 CAL) 1,000 mL (08/23/19 0730)    Antimicrobials: Anti-infectives (From admission, onward)   Start     Dose/Rate Route Frequency Ordered Stop    08/03/19 1442  ceFAZolin (ANCEF) 2-4 GM/100ML-% IVPB    Note to Pharmacy: [name]: cabinet override      08/03/19 1442 08/03/19 1458   07/30/19 0930  levofloxacin (LEVAQUIN) IVPB 500 mg     500 mg 100 mL/hr over 60 Minutes Intravenous Every 24 hours 07/30/19 0915 08/03/19 1101   07/23/19 0900  ceFEPIme (MAXIPIME) 2 g in sodium chloride 0.9 % 100 mL IVPB  Status:  Discontinued     2 g 200 mL/hr over 30 Minutes Intravenous Every 8 hours 07/23/19 0854 07/27/19 1503   07/19/19 1100  cefTRIAXone (ROCEPHIN) 2 g in sodium chloride 0.9 % 100 mL IVPB  Status:  Discontinued     2 g 200 mL/hr over 30 Minutes Intravenous Every 24 hours 07/19/19 1052 07/23/19 0842   07/19/19 1100  metroNIDAZOLE (FLAGYL) IVPB 500 mg     500 mg 100 mL/hr over 60 Minutes Intravenous Every 8 hours 07/19/19 1052 07/24/19 0301       Objective: Vitals: Today's Vitals   08/23/19 0817 08/23/19 1121 08/23/19 1131 08/23/19 1149  BP: 108/71  105/67 105/67  Pulse: 80 87 81 81  Resp: '16 16 19 20  ' Temp:   98.9 F (37.2 C)   TempSrc:   Oral   SpO2: 94% 96% 97%  96%  Weight:      Height:      PainSc:        Intake/Output Summary (Last 24 hours) at 08/23/2019 1317 Last data filed at 08/23/2019 1105 Gross per 24 hour  Intake -  Output 1500 ml  Net -1500 ml   Filed Weights   08/18/19 0500 08/19/19 0500 08/23/19 0500  Weight: 74.3 kg 73.9 kg 58.6 kg   Weight change:    Intake/Output from previous day: 04/19 0701 - 04/20 0700 In: -  Out: 1300 [Urine:1300] Intake/Output this shift: Total I/O In: -  Out: 600 [Urine:600]  Examination:  General exam: Alert,awake,follows commands. HEENT:Oral mucosa moist, Ear/Nose WNL grossly, TC +, tongue swelling has resolved and tongue is within oral cavity. Respiratory system: bilaterally clear,no wheezing or crackles,no use of accessory muscle, non tender. Cardiovascular system: S1 & S2 +, regular, No JVD. Gastrointestinal system: Abdomen soft, NT,ND, BS+. PEG+,  Ext foleY+ Nervous System:Alert, awake, moving right side some, able to squeeze and wiggle right toes, flaccid weakness on the left side.  Extremities: No edema, distal peripheral pulses palpable.  Skin: No rashes,no icterus. MSK: Normal muscle bulk,tone, power  Data Reviewed: I have personally reviewed following labs and imaging studies CBC: Recent Labs  Lab 08/19/19 0600 08/22/19 0905 08/23/19 0507  WBC 10.9* 11.8* 9.1  HGB 10.0* 9.5* 9.2*  HCT 31.3* 30.4* 28.8*  MCV 87.7 89.9 87.8  PLT 197 173 086   Basic Metabolic Panel: Recent Labs  Lab 08/19/19 0600 08/22/19 0905 08/23/19 0507  NA 135 136 136  K 3.7 3.9 3.7  CL 93* 96* 97*  CO2 34* 32 32  GLUCOSE 130* 144* 106*  BUN 31* 23* 21*  CREATININE 0.58 0.51 0.49  CALCIUM 10.7* 10.2 10.2  MG 1.9 1.9  --   PHOS 3.2 3.1  --    GFR: Estimated Creatinine Clearance: 62.1 mL/min (by C-G formula based on SCr of 0.49 mg/dL). Liver Function Tests: Recent Labs  Lab 08/22/19 0905  AST 17  ALT 35  ALKPHOS 66  BILITOT 0.7  PROT 5.4*  ALBUMIN 2.5*   No results for input(s): LIPASE, AMYLASE in the last 168 hours. No results for input(s): AMMONIA in the last 168 hours. Coagulation Profile: No results for input(s): INR, PROTIME in the last 168 hours. Cardiac Enzymes: No results for input(s): CKTOTAL, CKMB, CKMBINDEX, TROPONINI in the last 168 hours. BNP (last 3 results) No results for input(s): PROBNP in the last 8760 hours. HbA1C: No results for input(s): HGBA1C in the last 72 hours. CBG: Recent Labs  Lab 08/22/19 1939 08/22/19 2309 08/23/19 0331 08/23/19 0744 08/23/19 1218  GLUCAP 201* 117* 105* 114* 140*   Lipid Profile: No results for input(s): CHOL, HDL, LDLCALC, TRIG, CHOLHDL, LDLDIRECT in the last 72 hours. Thyroid Function Tests: No results for input(s): TSH, T4TOTAL, FREET4, T3FREE, THYROIDAB in the last 72 hours. Anemia Panel: No results for input(s): VITAMINB12, FOLATE, FERRITIN, TIBC, IRON, RETICCTPCT  in the last 72 hours. Sepsis Labs: No results for input(s): PROCALCITON, LATICACIDVEN in the last 168 hours.  No results found for this or any previous visit (from the past 240 hour(s)).    Radiology Studies: No results found.   LOS: 36 days   Time spent: More than 50% of that time was spent in counseling and/or coordination of care.  Antonieta Pert, MD [facility]  08/23/2019, 1:16 PM

## 2019-08-24 LAB — CBC
HCT: 29.2 % — ABNORMAL LOW (ref 36.0–46.0)
Hemoglobin: 9.3 g/dL — ABNORMAL LOW (ref 12.0–15.0)
MCH: 28.4 pg (ref 26.0–34.0)
MCHC: 31.8 g/dL (ref 30.0–36.0)
MCV: 89.3 fL (ref 80.0–100.0)
Platelets: 159 10*3/uL (ref 150–400)
RBC: 3.27 MIL/uL — ABNORMAL LOW (ref 3.87–5.11)
RDW: 15.5 % (ref 11.5–15.5)
WBC: 8.2 10*3/uL (ref 4.0–10.5)
nRBC: 0 % (ref 0.0–0.2)

## 2019-08-24 LAB — BASIC METABOLIC PANEL
Anion gap: 6 (ref 5–15)
BUN: 19 mg/dL (ref 6–20)
CO2: 32 mmol/L (ref 22–32)
Calcium: 10.2 mg/dL (ref 8.9–10.3)
Chloride: 97 mmol/L — ABNORMAL LOW (ref 98–111)
Creatinine, Ser: 0.53 mg/dL (ref 0.44–1.00)
GFR calc Af Amer: >60 mL/min (ref >60)
GFR calc non Af Amer: >60 mL/min (ref >60)
Glucose, Bld: 109 mg/dL — ABNORMAL HIGH (ref 70–99)
Potassium: 3.8 mmol/L (ref 3.5–5.1)
Sodium: 135 mmol/L (ref 135–145)

## 2019-08-24 LAB — GLUCOSE, CAPILLARY
Glucose-Capillary: 106 mg/dL — ABNORMAL HIGH (ref 70–99)
Glucose-Capillary: 110 mg/dL — ABNORMAL HIGH (ref 70–99)
Glucose-Capillary: 123 mg/dL — ABNORMAL HIGH (ref 70–99)
Glucose-Capillary: 145 mg/dL — ABNORMAL HIGH (ref 70–99)
Glucose-Capillary: 178 mg/dL — ABNORMAL HIGH (ref 70–99)
Glucose-Capillary: 63 mg/dL — ABNORMAL LOW (ref 70–99)
Glucose-Capillary: 97 mg/dL (ref 70–99)

## 2019-08-24 NOTE — TOC Progression Note (Signed)
Transition of Care [facility]) - Progression Note    Patient Details  Name: Haley Hernandez MRN: [id number] Date of Birth: [birth year]  Transition of Care [facility] CM/SW Contact  Kermit Balo, RN Phone Number: 08/24/2019, 3:05 PM  Clinical Narrative:    CM has spoken with Rep from [facility] about d/c planning.  CM has also spoken to both daughters over the phone and went over that their mother is ready for rehab and they needed to decide on one of the offers for rehab. Haley Hernandez said she would talk to [facility] and they would update CM today. Haley Hernandez called CM back and they state that [employer] [employer] is in network with [employer]. CM called rep from [facility] again and she confirms that [employer] is NOT in network and they will not make an exception since she has other offers from in network SNFs. CM has updated Haley Hernandez with this information. She was upset and states she will have to talk to her sister about her mother going home. CM encouraged her to talk with Haley Hernandez and let me know ASAP as DME, HH and trach care will need to be arranged. She voiced understanding.  CM has updated MD.      Barriers to Discharge: Continued Medical Work up, English as a second language teacher, Inadequate or no Clinical research associate and Services       Post Acute Care Choice: Long Term Acute Care (LTAC), Skilled Nursing Facility Living arrangements for the past 2 months: Single Family Home                                       Social Determinants of Health (SDOH) Interventions    Readmission Risk Interventions No flowsheet data found.

## 2019-08-24 NOTE — Progress Notes (Signed)
  Speech Language Pathology Treatment: Haley Hernandez Speaking valve  Patient Details Name: Haley Hernandez MRN: [id number] DOB: 1960-07-31 Today's Date: 08/24/2019 Time: 1030-1046 SLP Time Calculation (min) (ACUTE ONLY): 16 min  Assessment / Plan / Recommendation Clinical Impression  Pt was drowsy, and her vitals remained stable throughout the session. Pt initially upright in bed for PMV trials. Her trach was changed to a cuffless yesterday (4/20). No phonation was attempted, despite Mod-Max cues. When fully upright, PMV was donned multiple times for ~1 minute, and noted with back pressure with each removal, though it appeared the positioning of her head (neck flexion) was not allowing for much upper airway redirection. Pt was reclined back to allow for more room to direct air via upper airway. PMV was then donned for a total of 5 minutes, with no back pressure noted at all. Recommend PMV only be worn with SLP at this time. Will continue to follow, and will see for bedside swallow eval as clinically appropriate.     HPI HPI: 59 y.o. female with past medical history of intracerebral hemorrhage 7 years ago, hypertension presents to the emergency department after family found her unresponsive with L hemipleiga and R gaze deviation. 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 Pt intubated and with EVD placed on 3/15. EVD removed 3/24. tracheostomy 3/27; EEG 3/29 moderate diffuse encephalopathy with no seizures (multiple events of RUE twitching occurred during EEG without EEG changes)      SLP Plan  Continue with current plan of care       Recommendations         Patient may use Passy-Muir Speech Valve: with SLP only PMSV Supervision: Full         Oral Care Recommendations: Oral care QID Follow up Recommendations: Skilled Nursing facility;LTACH;24 hour supervision/assistance SLP Visit Diagnosis: Aphonia (R49.1) Plan: Continue with current  plan of care       GO               Maudry Mayhew, Student SLP Office: [phone]  08/24/2019, 11:37 AM

## 2019-08-24 NOTE — Progress Notes (Addendum)
PROGRESS NOTE    Haley Hernandez  SKA:[id number] DOB: 04/19/1961 DOA: 07/18/2019 PCP: No primary care provider on file.   Brief Narrative: 59 year old female with intracerebral hemorrhage 7 years ago hypertension presented to the ED after family found her unresponsive last knOWN NORMAL 7.45 PM PTA.  EMS noticed left-sided weakness and gaze deviation to the right blood pressure in 250s, in the ED CT head showed large right thalamic hemorrhage with intraventricular extension and early hydrocephalus, emergently intubated for airway protection placed on labetalol and Cleviprex for blood pressure management seen by neurosurgery, admitted to neurology service In ICU on 3/15.  Patient is since status post tracheostomy and PEG and transferred to [facility] April 3rd.  Hospital course complicated by leukocytosis and pneumonia/bronchitis, and also developed tongue swelling/macroglossia in April 4-  Felt to be reaction to amlodipine versus losartan, both have been discontinued, seen by ENT, advised steroids, use of "bite block".08/11/2019 and 08/13/2019 the patients telemetry appeared to display ST segment elevation. Follow up EKG on 08/11/2019 demonstrated a repolarization abnormality, troponins have been negative.  Subjective:  No acute events.  Daughter concern regarding staples.  No nausea no vomiting.  Tolerating tube feeds. Continues to have secretion issues.   Assessment & Plan:  Hemorrhagic stroke from hypertensive urgency/emergency status post external ventricular drainage. Has hx of ICH 7 yrs ago.  Seen by neurology continue PT OT supportive care.  Avoid anticoagulation.  Acute respiratory failure/history of OSA: patient was intubated to protect airway for ICH, now has tracheostomy in place since March 27 and PEG in place continue supportive care, secretion management. Doing well without trach collar, changing to cuffless size 6 tracheostomy, PCCM following re-involving speech-language pathology, PCCM seeing  weekly-and plans to follow-up in trach clinic post discharge. Secretions remains an issue. And pt does not follow commands  Per speech therapy "Recommend PMV only be worn with SLP at this time."   Angioedema involving tongue with swelling,seen by ENT, advised bite-block: However patient's tongue was so swollen that bite block could not be inserted, was placed on IV steroids with significant improvement.  Switching to oral prednisone by PCCM. Off Benadryl.Amlodipine/losartan has been discontinued as a potential etiology.  Nutrition: on Tube feed and insulin Lantus 25 units and sliding scale q4h. Sugar stable, last hba1c 5.7 on 07/19/19.  Adjust insulin as steroid is being weaned off Recent Labs  Lab 08/24/19 0629 08/24/19 0759 08/24/19 1222 08/24/19 1629 08/24/19 1933  GLUCAP 123* 110* 178* 145* 97   Hypertension blood pressure well controlled on hydralazine, chlorthalidone and labetalol.  Amlodipine and losartan has been discontinued due to angioedema.  Continue to monitor closely.  Lethargic/decreased mentation: improved. She has been much more alert awake. Continue on supportive care.   Last CT head on 4/5 showed continued improvement in the right thalamic hemorrhage, a small amount of ventricular hemorrhage with improvement, mild ventricular enlargement unchanged mild midline shift to the left slightly improved D/w Neurosurgery recommended outpatient follow up in 4-6 weeks.  Pneumonia/bronchitis, was treated for pneumonia and completed antibiotic courses multiple rounds.   Episode of fever on 4/3: No recurrence. Repeat UA/urine culture on April 3 demonstrating only insignificant growth. Blood cultures x 2 collected on that date as well as 3/24, 3/26 have had no growth.  Anemia from acute illness monitor hemoglobin overall stable.  Abnormal telemetry with ST elevation , troponin neg.  Goals of care: Seen by palliative care family wishes to continue full medical care.   DVT  prophylaxis:SCD Code Status:FULL Family Communication: plan  of care discussed with patient. Status is: Inpatient  Remains inpatient appropriate because:Inpatient level of care appropriate due to severity of illness  Dispo: The patient is from: Home              Anticipated d/c is to: LTAC vs facility with trach and G tube care.               Anticipated d/c date is: 24 to 48 hours.              Patient currently is not medically stable to d/c. Nutrition: Diet Order    None      Nutrition Problem: Inadequate oral intake Etiology: inability to eat Signs/Symptoms: NPO status Interventions: Tube feeding, Prostat Body mass index is 24.54 kg/m.  Consultants: ENT, Neuro,PCCM Procedures: 3/15 ETT >> 3/27 Trach 3/28 >> 3/16 PICC >>  Microbiology:see note  Medications: Scheduled Meds: . chlorhexidine gluconate (MEDLINE KIT)  15 mL Mouth Rinse BID  . Chlorhexidine Gluconate Cloth  6 each Topical Daily  . chlorthalidone  12.5 mg Per Tube Daily  . famotidine  20 mg Per Tube BID  . free water  140 mL Per Tube Q6H  . guaiFENesin  10 mL Per Tube BID  . hydrALAZINE  100 mg Per Tube Q8H  . insulin aspart  0-20 Units Subcutaneous Q4H  . insulin aspart  7 Units Subcutaneous Q4H  . insulin glargine  24 Units Subcutaneous Daily  . labetalol  200 mg Per Tube TID  . magic mouthwash w/lidocaine  5 mL Oral TID  . mouth rinse  15 mL Mouth Rinse 10 times per day  . predniSONE  30 mg Per Tube Daily  . sodium chloride flush  10-40 mL Intracatheter Q12H  . triamcinolone   Mouth/Throat TID   Continuous Infusions: . feeding supplement (JEVITY 1.2 CAL) 60 mL/hr at 08/23/19 1856    Antimicrobials: Anti-infectives (From admission, onward)   Start     Dose/Rate Route Frequency Ordered Stop   08/03/19 1442  ceFAZolin (ANCEF) 2-4 GM/100ML-% IVPB    Note to Pharmacy: [name]: cabinet override      08/03/19 1442 08/03/19 1458   07/30/19 0930  levofloxacin (LEVAQUIN) IVPB 500 mg      500 mg 100 mL/hr over 60 Minutes Intravenous Every 24 hours 07/30/19 0915 08/03/19 1101   07/23/19 0900  ceFEPIme (MAXIPIME) 2 g in sodium chloride 0.9 % 100 mL IVPB  Status:  Discontinued     2 g 200 mL/hr over 30 Minutes Intravenous Every 8 hours 07/23/19 0854 07/27/19 1503   07/19/19 1100  cefTRIAXone (ROCEPHIN) 2 g in sodium chloride 0.9 % 100 mL IVPB  Status:  Discontinued     2 g 200 mL/hr over 30 Minutes Intravenous Every 24 hours 07/19/19 1052 07/23/19 0842   07/19/19 1100  metroNIDAZOLE (FLAGYL) IVPB 500 mg     500 mg 100 mL/hr over 60 Minutes Intravenous Every 8 hours 07/19/19 1052 07/24/19 0301       Objective: Vitals: Today's Vitals   08/24/19 1700 08/24/19 1800 08/24/19 1931 08/24/19 2016  BP:   119/74   Pulse: 94 90 88 91  Resp: '17 20 19 16  ' Temp:   98.6 F (37 C)   TempSrc:   Oral   SpO2: 95% 93% 98% 96%  Weight:      Height:      PainSc:        Intake/Output Summary (Last 24 hours) at 08/24/2019 2025 Last data  filed at 08/24/2019 1810 Gross per 24 hour  Intake --  Output 1000 ml  Net -1000 ml   Filed Weights   08/19/19 0500 08/23/19 0500 08/24/19 0500  Weight: 73.9 kg 58.6 kg 57 kg   Weight change: -1.6 kg   Intake/Output from previous day: 04/20 0701 - 04/21 0700 In: -  Out: 2025 [Urine:2025] Intake/Output this shift: No intake/output data recorded.  Examination: General: Appear in mild distress, no Rash; Oral Mucosa Clear, moist. no Abnormal Neck Mass Or lumps, Conjunctiva normal  Cardiovascular: S1 and S2 Present, no Murmur, Respiratory:increased  respiratory effort, Bilateral Air entry present and bilateral  Crackles, no wheezes Abdomen: Bowel Sound present, Soft and no tenderness, no hernia Extremities: no Pedal edema, no calf tenderness Neurology: alert and not oriented to time, place, and person affect follows command.  Data Reviewed: I have personally reviewed following labs and imaging studies CBC: Recent Labs  Lab 08/19/19 0600  08/22/19 0905 08/23/19 0507 08/24/19 0507  WBC 10.9* 11.8* 9.1 8.2  HGB 10.0* 9.5* 9.2* 9.3*  HCT 31.3* 30.4* 28.8* 29.2*  MCV 87.7 89.9 87.8 89.3  PLT 197 173 168 686   Basic Metabolic Panel: Recent Labs  Lab 08/19/19 0600 08/22/19 0905 08/23/19 0507 08/24/19 0507  NA 135 136 136 135  K 3.7 3.9 3.7 3.8  CL 93* 96* 97* 97*  CO2 34* 32 32 32  GLUCOSE 130* 144* 106* 109*  BUN 31* 23* 21* 19  CREATININE 0.58 0.51 0.49 0.53  CALCIUM 10.7* 10.2 10.2 10.2  MG 1.9 1.9  --   --   PHOS 3.2 3.1  --   --    GFR: Estimated Creatinine Clearance: 61.4 mL/min (by C-G formula based on SCr of 0.53 mg/dL). Liver Function Tests: Recent Labs  Lab 08/22/19 0905  AST 17  ALT 35  ALKPHOS 66  BILITOT 0.7  PROT 5.4*  ALBUMIN 2.5*   No results for input(s): LIPASE, AMYLASE in the last 168 hours. No results for input(s): AMMONIA in the last 168 hours. Coagulation Profile: No results for input(s): INR, PROTIME in the last 168 hours. Cardiac Enzymes: No results for input(s): CKTOTAL, CKMB, CKMBINDEX, TROPONINI in the last 168 hours. BNP (last 3 results) No results for input(s): PROBNP in the last 8760 hours. HbA1C: No results for input(s): HGBA1C in the last 72 hours. CBG: Recent Labs  Lab 08/24/19 0629 08/24/19 0759 08/24/19 1222 08/24/19 1629 08/24/19 1933  GLUCAP 123* 110* 178* 145* 97   Lipid Profile: No results for input(s): CHOL, HDL, LDLCALC, TRIG, CHOLHDL, LDLDIRECT in the last 72 hours. Thyroid Function Tests: No results for input(s): TSH, T4TOTAL, FREET4, T3FREE, THYROIDAB in the last 72 hours. Anemia Panel: No results for input(s): VITAMINB12, FOLATE, FERRITIN, TIBC, IRON, RETICCTPCT in the last 72 hours. Sepsis Labs: No results for input(s): PROCALCITON, LATICACIDVEN in the last 168 hours.  No results found for this or any previous visit (from the past 240 hour(s)).    Radiology Studies: No results found.   LOS: 37 days   Time spent: More than 50% of that  time was spent in counseling and/or coordination of care.  Berle Mull, MD [facility]  08/24/2019, 8:25 PM

## 2019-08-24 NOTE — Progress Notes (Addendum)
Physical Therapy Treatment Patient Details Name: Haley Hernandez MRN: [id number] DOB: 1961/03/19 Today's Date: 08/24/2019    History of Present Illness 59 y.o. female with past medical history of intracerebral hemorrhage 7 years ago, hypertension presents to the emergency department after family found her unresponsive with L hemipleiga and R gaze deviation. 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 Pt intubated and with EVD placed on 3/15. EVD removed 3/24. tracheostomy 3/27; EEG 3/29 moderate diffuse encephalopathy with no seizures (multiple events of RUE twitching occurred during EEG without EEG changes)    PT Comments    Patient seen for mobility progression. Pt with active and purposeful movement of R UE/LE with cues. Pt with improving cervical rotation and ability to maintain head in midline with cues. Continues to demonstrate L side neglect and inability to visually track past midline. PT will continue to follow acutely and progress as tolerated.    Follow Up Recommendations  SNF;LTACH;Supervision/Assistance - 24 hour     Equipment Recommendations  Other (comment)    Recommendations for Other Services       Precautions / Restrictions Precautions Precautions: Fall Precaution Comments: SBP<160, HOB>30 degrees Restrictions Weight Bearing Restrictions: No    Mobility  Bed Mobility Overal bed mobility: Needs Assistance Bed Mobility: Rolling;Sit to Sidelying;Supine to Sit Rolling: Max assist   Supine to sit: +2 for physical assistance;Total assist   Sit to sidelying: Total assist;+2 for physical assistance General bed mobility comments: pt turns head rt when rolling to her rt (when cued); pt cannot turn head (or eyes) past midline when looking left  Transfers                    Ambulation/Gait                 Stairs             Wheelchair Mobility    Modified Rankin (Stroke Patients  Only) Modified Rankin (Stroke Patients Only) Pre-Morbid Rankin Score: (unable to determine at this time) Modified Rankin: Severe disability     Balance Overall balance assessment: Needs assistance Sitting-balance support: Bilateral upper extremity supported;Feet unsupported Sitting balance-[name]: Poor Sitting balance - Comments: max A to maintain sitting balance EOB; worked on midline posture and cervical extension/rotation; pt able to maintain head in midline with cues but assistance required for extension; pt assisting with R UE  Postural control: (varies)                                  Cognition Arousal/Alertness: Awake/alert Behavior During Therapy: Flat affect Overall Cognitive Status: Difficult to assess                                 General Comments: pt following simple commands with increased time, "squeeze my hand," "wipe your mouth"      Exercises Other Exercises Other Exercises: lateral trunk flexion     General Comments        Pertinent Vitals/Pain Pain Assessment: Faces Faces Pain Scale: No hurt    Home Living                      Prior Function            PT Goals (current goals can now be found in the care plan section)  Progress towards PT goals: Progressing toward goals    Frequency    Min 3X/week      PT Plan Current plan remains appropriate    Co-evaluation              AM-PAC PT "6 Clicks" Mobility   Outcome Measure  Help needed turning from your back to your side while in a flat bed without using bedrails?: Total Help needed moving from lying on your back to sitting on the side of a flat bed without using bedrails?: Total Help needed moving to and from a bed to a chair (including a wheelchair)?: Total Help needed standing up from a chair using your arms (e.g., wheelchair or bedside chair)?: Total Help needed to walk in hospital room?: Total Help needed climbing 3-5 steps with a  railing? : Total 6 Click Score: 6    End of Session Equipment Utilized During Treatment: Oxygen Activity Tolerance: Patient tolerated treatment well Patient left: in bed;with call bell/phone within reach;with bed alarm set;with family/visitor present(bed in chair position) Nurse Communication: Mobility status PT Visit Diagnosis: Other symptoms and signs involving the nervous system (R29.898);Muscle weakness (generalized) (M62.81);Hemiplegia and hemiparesis Hemiplegia - Right/Left: Left Hemiplegia - caused by: Nontraumatic intracerebral hemorrhage     Time: 0258-5277 PT Time Calculation (min) (ACUTE ONLY): 27 min  Charges:  $Therapeutic Activity: 8-22 mins $Neuromuscular Re-education: 8-22 mins                     [name], PTA Acute Rehabilitation Services Pager: [phone] Office: [phone]     [name] 08/24/2019, 5:25 PM

## 2019-08-25 LAB — CBC
HCT: 28.2 % — ABNORMAL LOW (ref 36.0–46.0)
Hemoglobin: 8.9 g/dL — ABNORMAL LOW (ref 12.0–15.0)
MCH: 28.2 pg (ref 26.0–34.0)
MCHC: 31.6 g/dL (ref 30.0–36.0)
MCV: 89.2 fL (ref 80.0–100.0)
Platelets: 144 10*3/uL — ABNORMAL LOW (ref 150–400)
RBC: 3.16 MIL/uL — ABNORMAL LOW (ref 3.87–5.11)
RDW: 15.2 % (ref 11.5–15.5)
WBC: 8.9 10*3/uL (ref 4.0–10.5)
nRBC: 0 % (ref 0.0–0.2)

## 2019-08-25 LAB — GLUCOSE, CAPILLARY
Glucose-Capillary: 119 mg/dL — ABNORMAL HIGH (ref 70–99)
Glucose-Capillary: 124 mg/dL — ABNORMAL HIGH (ref 70–99)
Glucose-Capillary: 125 mg/dL — ABNORMAL HIGH (ref 70–99)
Glucose-Capillary: 140 mg/dL — ABNORMAL HIGH (ref 70–99)
Glucose-Capillary: 168 mg/dL — ABNORMAL HIGH (ref 70–99)
Glucose-Capillary: 197 mg/dL — ABNORMAL HIGH (ref 70–99)
Glucose-Capillary: 79 mg/dL (ref 70–99)

## 2019-08-25 LAB — BASIC METABOLIC PANEL
Anion gap: 9 (ref 5–15)
BUN: 16 mg/dL (ref 6–20)
CO2: 31 mmol/L (ref 22–32)
Calcium: 10.5 mg/dL — ABNORMAL HIGH (ref 8.9–10.3)
Chloride: 95 mmol/L — ABNORMAL LOW (ref 98–111)
Creatinine, Ser: 0.42 mg/dL — ABNORMAL LOW (ref 0.44–1.00)
GFR calc Af Amer: >60 mL/min (ref >60)
GFR calc non Af Amer: >60 mL/min (ref >60)
Glucose, Bld: 124 mg/dL — ABNORMAL HIGH (ref 70–99)
Potassium: 3.7 mmol/L (ref 3.5–5.1)
Sodium: 135 mmol/L (ref 135–145)

## 2019-08-25 NOTE — Progress Notes (Deleted)
Pt was oriented to person place time and situation but got disoriented and attempted getting OOB without assistance. Also pulled out his PIV line. New one inserted and wrapped. Reoriented and redirected on the use of call bell.  Low bed and floor mat in use for safety. Had BM x 3 last nght. No acute distress this am.

## 2019-08-25 NOTE — Progress Notes (Signed)
The previous not entered during the day at 0736 was an Error it was the wrong pt therefore will like to delete it.  This was an error Wrong chat and wrong patient. Thanks. Orma Render, RN

## 2019-08-25 NOTE — Progress Notes (Signed)
Occupational Therapy Treatment Patient Details Name: Haley Hernandez MRN: [id number] DOB: 1960/12/30 Today's Date: 08/25/2019    History of present illness 59 y.o. female with past medical history of intracerebral hemorrhage 7 years ago, hypertension presents to the emergency department after family found her unresponsive with L hemipleiga and R gaze deviation. 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 Pt intubated and with EVD placed on 3/15. EVD removed 3/24. tracheostomy 3/27; EEG 3/29 moderate diffuse encephalopathy with no seizures (multiple events of RUE twitching occurred during EEG without EEG changes)   OT comments  All goals reviewed and updated. Pt able to sit EOB 12 min this date with total assist for balance. Continual retro and right lateral LOB with pt unable to self-correct. Worked on weight bearing through LUE and weight shifting while seated EOB. Worked on reaching with RUE noting under and overshooting during task. Pt able to follow 50% of commands with increased time and repeated cues. Pt continues to demonstrate decreased cognition, ROM, FMC, GMC, strength, endurance, balance, sitting tolerance, activity tolerance, safety awareness, and visual perceptual deficits impacting ability to complete self-care and functional transfer tasks. Continue to recommend skilled OT services to address above deficits in order to promote function and prevent further decline. Continue to recommend SNF placement for additional rehab prior to discharge home.    Follow Up Recommendations  SNF;Supervision/Assistance - 24 hour    Equipment Recommendations  Other (comment);Hospital bed(hoyer lift)    Recommendations for Other Services      Precautions / Restrictions Precautions Precautions: Fall Precaution Comments: Trach collar, 5L 28%FiO2. SBP<160, HOB>30 degrees. PEG. Restrictions Weight Bearing Restrictions: No       Mobility Bed  Mobility Overal bed mobility: Needs Assistance Bed Mobility: Rolling;Supine to Sit;Sit to Supine Rolling: Total assist   Supine to sit: +2 for physical assistance;Total assist Sit to supine: +2 for physical assistance;Total assist   General bed mobility comments: Pt does not initiate task or attempt to assist with RUE despite cues. Assist for BLEs and trunk.   Transfers                 General transfer comment: not appropriate at this time    Balance Overall balance assessment: Needs assistance Sitting-balance support: Bilateral upper extremity supported;Feet supported Sitting balance-[name]: Zero Sitting balance - Comments: Pt tolerated sitting EOB 12 min with total assist. Despite BLE and BUE support, pt required total assist to maintain balance. Continual retro and right lateral lean/LOB.   Postural control: Posterior lean;Right lateral lean                                 ADL either performed or assessed with clinical judgement   ADL Overall ADL's : Needs assistance/impaired     Grooming: Moderate assistance;Oral care Grooming Details (indicate cue type and reason): Cues to initiate. Oral suction placed in pt's hand and initiated bringing to mouth with pt able to complete remainder of task. Pt able to wash face again requiring assist to place washcloth in hand and start to bring to face with pt taking over to complete.                              Functional mobility during ADLs: (EOB only this date) General ADL Comments: Pt tolerated sitting EOB 12 min with total assist for balance.  Vision       Perception     Praxis      Cognition Arousal/Alertness: Awake/alert Behavior During Therapy: Flat affect Overall Cognitive Status: Difficult to assess                                 General Comments: Pt able to follow 50% of commands with multimodal cues. Increased time needed to process and follow instructions.          Exercises     Shoulder Instructions       General Comments HR: 93, RR: 18, O2 96% on 5L at 28% FiO2, BP 111/61mmHg.     Pertinent Vitals/ Pain       Pain Assessment: 0-10 Pain Score: 3  Pain Location: Headache Pain Descriptors / Indicators: Grimacing;Discomfort Pain Intervention(s): Monitored during session;Repositioned  Home Living                                          Prior Functioning/Environment              Frequency           Progress Toward Goals  OT Goals(current goals can now be found in the care plan section)  Progress towards OT goals: Progressing toward goals     Plan Discharge plan remains appropriate    Co-evaluation                 AM-PAC OT "6 Clicks" Daily Activity     Outcome Measure   Help from another person eating meals?: Total Help from another person taking care of personal grooming?: Total Help from another person toileting, which includes using toliet, bedpan, or urinal?: Total Help from another person bathing (including washing, rinsing, drying)?: Total Help from another person to put on and taking off regular upper body clothing?: Total Help from another person to put on and taking off regular lower body clothing?: Total 6 Click Score: 6    End of Session Equipment Utilized During Treatment: Oxygen  OT Visit Diagnosis: Unsteadiness on feet (R26.81);Other abnormalities of gait and mobility (R26.89);Muscle weakness (generalized) (M62.81);Low vision, both eyes (H54.2);Hemiplegia and hemiparesis Hemiplegia - Right/Left: Left Hemiplegia - caused by: Cerebral infarction   Activity Tolerance Patient tolerated treatment well   Patient Left in bed;with call bell/phone within reach;with bed alarm set   Nurse Communication Mobility status        Time: 1031-1100 OT Time Calculation (min): 29 min  Charges: OT General Charges $OT Visit: 1 Visit OT Treatments $Therapeutic Activity: 23-37  mins  Mauri Brooklyn OTR/L [phone]   Mauri Brooklyn 08/25/2019, 1:41 PM

## 2019-08-25 NOTE — Progress Notes (Signed)
Per MD request staple removed from patients scalp.

## 2019-08-25 NOTE — Progress Notes (Deleted)
Per MD request one staple removed from scalp.

## 2019-08-25 NOTE — Progress Notes (Signed)
Per MD order, PICC line removed. Cath intact at 37cm. Vaseline pressure gauze to site, pressure held x 5min. No bleeding to site. Pt instructed to keep dressing CDI x 24 hours. Avoid heavy lifting, pushing or pulling x 24 hours,  If bleeding occurs hold pressure, if bleeding does not stop contact MD or go to the ED. Pt does not have any questions. Haley Hernandez M  

## 2019-08-25 NOTE — Progress Notes (Signed)
PICC line removed per order however, discussed the need for PIV since not IV meds/fluids were ordered. Per policy & best practice having a "just in case" PIV places the pt a risk for a BSI. Haley Hernandez

## 2019-08-25 NOTE — TOC Progression Note (Signed)
Transition of Care [facility]) - Progression Note    Patient Details  Name: Haley Hernandez MRN: [id number] Date of Birth: 1960/06/28  Transition of Care [facility]) CM/SW Contact  Kermit Balo, RN Phone Number: 08/25/2019, 3:08 PM  Clinical Narrative:    CM spoke with daughter: [name] over the phone and inquired about d/c plans. She states they have decided on [facility]. CM updated [facility] and they are going to review her information and see if insurance will approve.  TOC following.     Barriers to Discharge: Continued Medical Work up, English as a second language teacher, Inadequate or no Clinical research associate and Services       Post Acute Care Choice: Long Term Acute Care (LTAC), Skilled Nursing Facility Living arrangements for the past 2 months: Single Family Home                                       Social Determinants of Health (SDOH) Interventions    Readmission Risk Interventions No flowsheet data found.

## 2019-08-25 NOTE — Progress Notes (Signed)
[facility] Progress Note  Patient: Haley Hernandez    MBT:[id number]  DOA: 07/18/2019     Date of Service: the patient was seen and examined on 08/25/2019  Chief Complaint  Patient presents with  . Code Stroke   Brief hospital course: Past medical history of HTN, OSA, remote history of ICH.  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  Patient was intubated in the ED.  Neurosurgery was consulted and EVD was placed.  PCCM was consulted for vent management.  IR was consulted for G-tube placement.  ENT was consulted for tracheostomy tube placement.  Currently further plan is arranged for discharge.  Assessment and Plan: Hemorrhagic stroke from hypertensive urgency/emergency status post external ventricular drainage. Has hx of ICH 7 yrs ago.  Seen by neurology continue PT OT supportive care.  Avoid anticoagulation.  Acute respiratory failure/history of OSA: patient was intubated to protect airway for ICH, now has tracheostomy in place since March 27 and PEG in place continue supportive care, secretion management. Doing well without trach collar, changing to cuffless size 6 tracheostomy, PCCM following re-involving speech-language pathology, PCCM seeing weekly-and plans to follow-up in trach clinic post discharge. Secretions remains an issue. And pt does not follow commands  Per speech therapy "Recommend PMV only be worn with SLP at this time."   Angioedema involving tongue with swelling,seen by ENT, advised bite-block: However patient's tongue was so swollen that bite block could not be inserted, was placed on IV steroids with significant improvement.  Switching to oral prednisone by PCCM. Off Benadryl.Amlodipine/losartan has been discontinued as a potential etiology.  Nutrition: on Tube feed and insulin Lantus 25 units and sliding scale q4h. Sugar stable, last hba1c 5.7 on 07/19/19.  Adjust insulin as  steroid is being weaned off Last Labs          Recent Labs  Lab 08/24/19 0629 08/24/19 0759 08/24/19 1222 08/24/19 1629 08/24/19 1933  GLUCAP 123* 110* 178* 145* 97     Hypertension blood pressure well controlled on hydralazine, chlorthalidone and labetalol.  Amlodipine and losartan has been discontinued due to angioedema.  Continue to monitor closely.  Lethargic/decreased mentation: improved. She has been much more alert awake. Continue on supportive care.   Last CT head on 4/5 showed continued improvement in the right thalamic hemorrhage, a small amount of ventricular hemorrhage with improvement, mild ventricular enlargement unchanged mild midline shift to the left slightly improved D/w Neurosurgery recommended outpatient follow up in 4-6 weeks.  Pneumonia/bronchitis, was treated for pneumonia and completed antibiotic courses multiple rounds.   Episode of fever on 4/3: No recurrence. Repeat UA/urine culture on April 3 demonstrating only insignificant growth. Blood cultures x 2 collected on that date as well as 3/24, 3/26 have had no growth.  Anemia from acute illness monitor hemoglobin overall stable.  Abnormal telemetry with ST elevation , troponin neg.  Goals of care: Seen by palliative care family wishes to continue full medical care.   Diet: On tube feedings DVT Prophylaxis: SCD, pharmacological prophylaxis contraindicated due to Due to intracranial bleeding   Advance goals of care discussion: Full code  Family Communication: no family was present at bedside, at the time of interview.   Disposition:  Status is: Inpatient  Remains inpatient appropriate because:Unsafe d/c plan and Patient with tracheostomy and PEG tube placement with secretion issues.   Dispo: The patient is from: Home  Anticipated d/c is to: LTAC              Anticipated d/c date is: 1 day              Patient currently is medically stable to d/c.  Subjective: Continues to have  secretion issues.  No nausea no vomiting.  Overnight was agitated currently stable.  Physical Exam: General:  alert not oriented to time, place, and person.  Appear in mild distress, affect flat in affect Eyes: PERRL ENT: Oral Mucosa Clear, moist  Neck: difficult to assess  JVD,  Cardiovascular: S1 and S2 Present, no Murmur,  Respiratory: good respiratory effort, Bilateral Air entry equal and Decreased, faint basal Crackles, no wheezes Abdomen: Bowel Sound present, Soft and difficult to assess  tenderness,  Skin: no rash Extremities: no Pedal edema, no calf tenderness Neurologic: Active and purposeful movement of the right upper and lower extremity.  Left-sided neglect.  Inability to visually track past midline. Gait not checked due to patient safety concerns  Vitals:   08/25/19 0528 08/25/19 0531 08/25/19 0733 08/25/19 0800  BP: 126/76 126/76 109/71   Pulse:  94 86 75  Resp:  '17 18 14  ' Temp:   99.5 F (37.5 C)   TempSrc:   Oral   SpO2:  98% 93% 94%  Weight:      Height:        Intake/Output Summary (Last 24 hours) at 08/25/2019 0830 Last data filed at 08/25/2019 0600 Gross per 24 hour  Intake 1160 ml  Output 1325 ml  Net -165 ml   Filed Weights   08/23/19 0500 08/24/19 0500 08/25/19 0357  Weight: 58.6 kg 57 kg 65.5 kg    Data Reviewed: I have personally reviewed and interpreted daily labs, tele strips, imagings as discussed above. I reviewed all nursing notes, pharmacy notes, vitals, pertinent old records I have discussed plan of care as described above with RN and patient/family.  CBC: Recent Labs  Lab 08/19/19 0600 08/22/19 0905 08/23/19 0507 08/24/19 0507 08/25/19 0500  WBC 10.9* 11.8* 9.1 8.2 8.9  HGB 10.0* 9.5* 9.2* 9.3* 8.9*  HCT 31.3* 30.4* 28.8* 29.2* 28.2*  MCV 87.7 89.9 87.8 89.3 89.2  PLT 197 173 168 159 606*   Basic Metabolic Panel: Recent Labs  Lab 08/19/19 0600 08/22/19 0905 08/23/19 0507 08/24/19 0507 08/25/19 0500  NA 135 136 136 135  135  K 3.7 3.9 3.7 3.8 3.7  CL 93* 96* 97* 97* 95*  CO2 34* 32 32 32 31  GLUCOSE 130* 144* 106* 109* 124*  BUN 31* 23* 21* 19 16  CREATININE 0.58 0.51 0.49 0.53 0.42*  CALCIUM 10.7* 10.2 10.2 10.2 10.5*  MG 1.9 1.9  --   --   --   PHOS 3.2 3.1  --   --   --     Studies: No results found.  Scheduled Meds: . chlorhexidine gluconate (MEDLINE KIT)  15 mL Mouth Rinse BID  . Chlorhexidine Gluconate Cloth  6 each Topical Daily  . chlorthalidone  12.5 mg Per Tube Daily  . famotidine  20 mg Per Tube BID  . free water  140 mL Per Tube Q6H  . guaiFENesin  10 mL Per Tube BID  . hydrALAZINE  100 mg Per Tube Q8H  . insulin aspart  0-20 Units Subcutaneous Q4H  . insulin aspart  7 Units Subcutaneous Q4H  . insulin glargine  24 Units Subcutaneous Daily  . labetalol  200 mg Per Tube TID  .  magic mouthwash w/lidocaine  5 mL Oral TID  . mouth rinse  15 mL Mouth Rinse 10 times per day  . predniSONE  30 mg Per Tube Daily  . sodium chloride flush  10-40 mL Intracatheter Q12H  . triamcinolone   Mouth/Throat TID   Continuous Infusions: . feeding supplement (JEVITY 1.2 CAL) 1,000 mL (08/24/19 2158)   PRN Meds: acetaminophen **OR** acetaminophen (TYLENOL) oral liquid 160 mg/5 mL **OR** acetaminophen, albuterol, glycopyrrolate, labetalol, loperamide HCl, oxyCODONE, sodium chloride flush  Time spent: 35 minutes  Author: Berle Mull, MD [facility] 08/25/2019 8:30 AM  To reach On-call, see care teams to locate the attending and reach out to them via [email or web address]. If 7PM-7AM, please contact night-coverage If you still have difficulty reaching the attending provider, please page the [facility] (Director on Call) for [facility] on amion for assistance.

## 2019-08-26 LAB — CBC
HCT: 29 % — ABNORMAL LOW (ref 36.0–46.0)
Hemoglobin: 9.1 g/dL — ABNORMAL LOW (ref 12.0–15.0)
MCH: 27.9 pg (ref 26.0–34.0)
MCHC: 31.4 g/dL (ref 30.0–36.0)
MCV: 89 fL (ref 80.0–100.0)
Platelets: 144 10*3/uL — ABNORMAL LOW (ref 150–400)
RBC: 3.26 MIL/uL — ABNORMAL LOW (ref 3.87–5.11)
RDW: 15.1 % (ref 11.5–15.5)
WBC: 8 10*3/uL (ref 4.0–10.5)
nRBC: 0 % (ref 0.0–0.2)

## 2019-08-26 LAB — GLUCOSE, CAPILLARY
Glucose-Capillary: 113 mg/dL — ABNORMAL HIGH (ref 70–99)
Glucose-Capillary: 95 mg/dL (ref 70–99)
Glucose-Capillary: 162 mg/dL — ABNORMAL HIGH (ref 70–99)
Glucose-Capillary: 179 mg/dL — ABNORMAL HIGH (ref 70–99)
Glucose-Capillary: 165 mg/dL — ABNORMAL HIGH (ref 70–99)

## 2019-08-26 LAB — BASIC METABOLIC PANEL
Anion gap: 7 (ref 5–15)
BUN: 15 mg/dL (ref 6–20)
CO2: 31 mmol/L (ref 22–32)
Calcium: 10.6 mg/dL — ABNORMAL HIGH (ref 8.9–10.3)
Chloride: 98 mmol/L (ref 98–111)
Creatinine, Ser: 0.5 mg/dL (ref 0.44–1.00)
GFR calc Af Amer: >60 mL/min (ref >60)
GFR calc non Af Amer: >60 mL/min (ref >60)
Glucose, Bld: 114 mg/dL — ABNORMAL HIGH (ref 70–99)
Potassium: 3.4 mmol/L — ABNORMAL LOW (ref 3.5–5.1)
Sodium: 136 mmol/L (ref 135–145)

## 2019-08-26 MED ORDER — PREDNISONE 5 MG/ML PO CONC
20.0000 mg | Freq: Every day | ORAL | Status: DC
  Administered 2019-08-27 – 2019-08-31 (×5): 20 mg
  Filled 2019-08-26 (×5): qty 4

## 2019-08-26 MED ORDER — FREE WATER: 200.0000 mL | Freq: Four times a day (QID) | Status: DC

## 2019-08-26 MED ORDER — FREE WATER
140.0000 mL | Freq: Four times a day (QID) | Status: DC
  Administered 2019-08-26 – 2019-08-30 (×16): 140 mL

## 2019-08-26 NOTE — Progress Notes (Signed)
  Speech Language Pathology Treatment: Haley Hernandez Speaking valve  Patient Details Name: Haley Hernandez MRN: [id number] DOB: [birth year] Today's Date: 08/26/2019 Time: 1440-1500 SLP Time Calculation (min) (ACUTE ONLY): 20 min  Assessment / Plan / Recommendation Clinical Impression  Pt was seen for skilled ST targeting PMV tolerance.  Pt was encountered awake/alert in bed and she was agreeable to PMV trials.  When PMV was donned, pt immediately coughed and PMV popped off.  Wet-sounding respirations were observed and secretions around the trach were noted.  RN was made aware and she was available to deep suction the pt prior to additional PMV trials.  Following deep suctioning, PMV was placed again for 30 seconds and mild back pressure was observed when it was doffed.  Pt was repositioned and PMV was donned again.  Pt was able to tolerate it without perceived difficulty for approximately 5-7 minutes.  She achieved phonation and repeated short words and phrases while PMV was donned.  Vocal intensity was reduced which reduced intelligibility, but vocal quality was clear.  Pt began to cough after 5-7 minutes and the valve again popped off.  PMV was donned for an additional 7-8 minutes and bedside swallow evaluation was completed (see note for additional details).  Pt began to cough during BSE which caused the PMV to pop off once more.  No additional PMV trials were attempted on this date.  Recommend that pt continue to only wear PMV with Speech Therapy at this time.     HPI HPI: 59 y.o. female with past medical history of intracerebral hemorrhage 7 years ago, hypertension presents to the emergency department after family found her unresponsive with L hemipleiga and R gaze deviation. 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 Pt intubated and with EVD placed on 3/15. EVD removed 3/24. tracheostomy 3/27; EEG 3/29 moderate diffuse encephalopathy with no seizures  (multiple events of RUE twitching occurred during EEG without EEG changes)      SLP Plan  Continue with current plan of care       Recommendations         Patient may use Passy-Muir Speech Valve: with SLP only PMSV Supervision: Full MD: Please consider changing trach tube to : Smaller size         Oral Care Recommendations: Oral care QID;Staff/trained caregiver to provide oral care Follow up Recommendations: Skilled Nursing facility;LTACH;24 hour supervision/assistance SLP Visit Diagnosis: Aphonia (R49.1) Plan: Continue with current plan of care       GO               Villa Herb M.S., CCC-SLP Acute Rehabilitation Services Office: [phone]  Shanon Rosser [facility] 08/26/2019, 3:16 PM

## 2019-08-26 NOTE — Progress Notes (Signed)
[facility] Progress Note  Patient: Haley Hernandez    AJG:[id number]  DOA: 07/18/2019     Date of Service: the patient was seen and examined on 08/26/2019  Chief Complaint  Patient presents with  . Code Stroke   Brief hospital course: Past medical history of HTN, OSA, remote history of ICH.  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  Patient was intubated in the ED.  Neurosurgery was consulted and EVD was placed.  PCCM was consulted for vent management.  IR was consulted for G-tube placement.  ENT was consulted for tracheostomy tube placement.  Currently further plan is arranged for discharge.  Assessment and Plan: Hemorrhagic stroke  Hypertensive urgency. Treated with external ventricular drainage.  Has hx of ICH 7 yrs ago.   Seen by neurology  continue PT OT supportive care.   Avoid anticoagulation.  Acute respiratory failure/history of OSA: SP tracheostomy patient was intubated to protect airway for ICH, now has tracheostomy in place since March 27 and PEG in place  continue supportive care, secretion management. Doing well without trach collar,  changed to cuffless size 6 tracheostomy,  PCCM following  re-involving speech-language pathology,  PCCM seeing weekly-and plans to follow-up in trach clinic post discharge. Secretions remains an issue.  Per speech therapy "Recommend PMV only be worn with SLP at this time."  Angioedema involving tongue with swelling, seen by ENT,  advised bite-block:  However patient's tongue was so swollen that bite block could not be inserted,  was placed on IV steroids with significant improvement.   Switch to oral prednisone by PCCM.  We will continue 5-day taper. Off Benadryl. Amlodipine/losartan has been discontinued as a potential etiology.  Dysphagia. On Tube feed  insulin Lantus 25 units and sliding scale q4h. Sugar stable, last hba1c 5.7 on  07/19/19.  Adjust insulin as steroid is being weaned off  Hypertension  blood pressure well controlled on hydralazine and labetalol. Hold chlorthalidone due to hypercalcemia. Amlodipine and losartan has been discontinued due to angioedema. Continue to monitor closely.  Mild hypercalcemia. Discontinue chlorthalidone.  Will monitor.  Acute metabolic encephalopathy: improved. She has been much more alert awake. Continue on supportive care.   Last CT head on 4/5 showed continued improvement in the right thalamic hemorrhage, a small amount of ventricular hemorrhage with improvement, mild ventricular enlargement unchanged mild midline shift to the left slightly improved D/w Neurosurgery recommended outpatient follow up in 4-6 weeks.  Pneumonia/bronchitis,  was treated for pneumonia and completed antibiotic courses multiple rounds.   Anemia from acute illness monitor hemoglobin overall stable.  Goals of care: Seen by palliative care family wishes to continue full medical care.   Diet: On tube feedings DVT Prophylaxis: SCD, pharmacological prophylaxis contraindicated due to Due to intracranial bleeding   Advance goals of care discussion: Full code  Family Communication: no family was present at bedside, at the time of interview.   Disposition:  Status is: Inpatient  Remains inpatient appropriate because:Unsafe d/c plan and Patient with tracheostomy and PEG tube placement with secretion issues.   Dispo: The patient is from: Home              Anticipated d/c is to: LTAC              Anticipated d/c date is: 1 day              Patient currently is medically stable to d/c.  Subjective:  She condition still remains a problem.  No nausea no vomiting.  Little bit agitated today trying to pull on the lines requiring mittens.  Physical Exam: General:  alert, nonverbal, difficult to assess orientation  Appear in mild distress, affect flat in affect Eyes: PERRL ENT: Oral Mucosa Clear,  moist  Neck: difficult to assess  JVD,  Cardiovascular: S1 and S2 Present, no Murmur,  Respiratory: good respiratory effort, Bilateral Air entry equal and Decreased, faint basal Crackles, no wheezes Abdomen: Bowel Sound present, Soft and difficult to assess  tenderness,  Skin: no rash Extremities: no Pedal edema, no calf tenderness Neurologic: Active and purposeful movement of the right upper and lower extremity.  Left-sided neglect.  Inability to visually track past midline. Gait not checked due to patient safety concerns  Vitals:   08/26/19 1354 08/26/19 1524 08/26/19 1553 08/26/19 1643  BP: 122/76  120/73   Pulse: 92 91 100 98  Resp: 18 (!) 21 16   Temp:   98.9 F (37.2 C)   TempSrc:   Axillary   SpO2: 96% 96% 96% 94%  Weight:      Height:        Intake/Output Summary (Last 24 hours) at 08/26/2019 1903 Last data filed at 08/26/2019 1600 Gross per 24 hour  Intake 2750 ml  Output 1100 ml  Net 1650 ml   Filed Weights   08/23/19 0500 08/24/19 0500 08/25/19 0357  Weight: 58.6 kg 57 kg 65.5 kg    Data Reviewed: I have personally reviewed and interpreted daily labs, tele strips, imagings as discussed above. I reviewed all nursing notes, pharmacy notes, vitals, pertinent old records I have discussed plan of care as described above with RN and patient/family.  CBC: Recent Labs  Lab 08/22/19 0905 08/23/19 0507 08/24/19 0507 08/25/19 0500 08/26/19 0416  WBC 11.8* 9.1 8.2 8.9 8.0  HGB 9.5* 9.2* 9.3* 8.9* 9.1*  HCT 30.4* 28.8* 29.2* 28.2* 29.0*  MCV 89.9 87.8 89.3 89.2 89.0  PLT 173 168 159 144* 474*   Basic Metabolic Panel: Recent Labs  Lab 08/22/19 0905 08/23/19 0507 08/24/19 0507 08/25/19 0500 08/26/19 0416  NA 136 136 135 135 136  K 3.9 3.7 3.8 3.7 3.4*  CL 96* 97* 97* 95* 98  CO2 32 32 32 31 31  GLUCOSE 144* 106* 109* 124* 114*  BUN 23* 21* '19 16 15  ' CREATININE 0.51 0.49 0.53 0.42* 0.50  CALCIUM 10.2 10.2 10.2 10.5* 10.6*  MG 1.9  --   --   --   --    PHOS 3.1  --   --   --   --     Studies: No results found.  Scheduled Meds: . chlorhexidine gluconate (MEDLINE KIT)  15 mL Mouth Rinse BID  . Chlorhexidine Gluconate Cloth  6 each Topical Daily  . famotidine  20 mg Per Tube BID  . free water  140 mL Per Tube Q6H  . guaiFENesin  10 mL Per Tube BID  . hydrALAZINE  100 mg Per Tube Q8H  . insulin aspart  0-20 Units Subcutaneous Q4H  . insulin aspart  7 Units Subcutaneous Q4H  . insulin glargine  24 Units Subcutaneous Daily  . labetalol  200 mg Per Tube TID  . magic mouthwash w/lidocaine  5 mL Oral TID  . mouth rinse  15 mL Mouth Rinse 10 times per day  . predniSONE  30 mg Per Tube Daily  . sodium chloride flush  10-40 mL Intracatheter Q12H  .  triamcinolone   Mouth/Throat TID   Continuous Infusions: . feeding supplement (JEVITY 1.2 CAL) 60 mL/hr at 08/25/19 0909   PRN Meds: acetaminophen **OR** acetaminophen (TYLENOL) oral liquid 160 mg/5 mL **OR** acetaminophen, albuterol, glycopyrrolate, labetalol, loperamide HCl, oxyCODONE, sodium chloride flush  Time spent: 35 minutes  Author: Berle Mull, MD [facility] 08/26/2019 7:03 PM  To reach On-call, see care teams to locate the attending and reach out to them via [email or web address]. If 7PM-7AM, please contact night-coverage If you still have difficulty reaching the attending provider, please page the [facility] (Director on Call) for [facility] on amion for assistance.

## 2019-08-26 NOTE — Progress Notes (Signed)
Changed water bottle and set up 

## 2019-08-26 NOTE — Progress Notes (Addendum)
Palliative Medicine RN Note: Discussed Haley Hernandez in team rounds. GOC are clear, and TOC is working diligently on disposition. At this time, there is no further role for PMT involvement. We will sign off.  Please reconsult [place] if her family requests to speak to [place].  Margret Chance Mikaela Hilgeman, RN, BSN, [facility] Palliative Medicine Team 08/26/2019 10:20 AM Office [phone]

## 2019-08-26 NOTE — TOC Progression Note (Signed)
Transition of Care [facility]) - Progression Note    Patient Details  Name: Haley Hernandez MRN: [id number] Date of Birth: May 22, 1960  Transition of [facility]) CM/SW Contact  Kermit Balo, RN Phone Number: 08/26/2019, 8:56 AM  Clinical Narrative:    [facility] has started insurance authorization with [facility] and hope to hear back early next week. [facility] states [facility] usually takes 2-3 days to provide [place] decision.  TOC following.     Barriers to Discharge: Continued Medical Work up, English as a second language teacher, Inadequate or no Clinical research associate and Services       Post Acute Care Choice: Long Term Acute Care (LTAC), Skilled Nursing Facility Living arrangements for the past 2 months: Single Family Home                                       Social Determinants of Health (SDOH) Interventions    Readmission Risk Interventions No flowsheet data found.

## 2019-08-26 NOTE — Plan of Care (Signed)
Plan of care reviewed with pt at bedside. Deep suction performed frequently. PT/OT worked with pt at bedside. Peg in place with TF infusing per orders. Purewick in place. Call bell in reach. Pt stable at this time, will continue to monitor.  Problem: Education: Goal: Knowledge of disease or condition will improve Outcome: Progressing Goal: Knowledge of secondary prevention will improve Outcome: Progressing Goal: Knowledge of patient specific risk factors addressed and post discharge goals established will improve Outcome: Progressing Goal: Individualized Educational Video(s) Outcome: Progressing   Problem: Coping: Goal: Will verbalize positive feelings about self Outcome: Progressing Goal: Will identify appropriate support needs Outcome: Progressing   Problem: Health Behavior/Discharge Planning: Goal: Ability to manage health-related needs will improve Outcome: Progressing   Problem: Self-Care: Goal: Ability to participate in self-care as condition permits will improve Outcome: Progressing Goal: Verbalization of feelings and concerns over difficulty with self-care will improve Outcome: Progressing Goal: Ability to communicate needs accurately will improve Outcome: Progressing   Problem: Nutrition: Goal: Risk of aspiration will decrease Outcome: Progressing   Problem: Intracerebral Hemorrhage Tissue Perfusion: Goal: Complications of Intracerebral Hemorrhage will be minimized Outcome: Progressing   Problem: Education: Goal: Knowledge of General Education information will improve Description: Including pain rating scale, medication(s)/side effects and non-pharmacologic comfort measures Outcome: Progressing   Problem: Health Behavior/Discharge Planning: Goal: Ability to manage health-related needs will improve Outcome: Progressing   Problem: Clinical Measurements: Goal: Ability to maintain clinical measurements within normal limits will improve Outcome: Progressing Goal:  Will remain free from infection Outcome: Progressing Goal: Diagnostic test results will improve Outcome: Progressing Goal: Respiratory complications will improve Outcome: Progressing Goal: Cardiovascular complication will be avoided Outcome: Progressing   Problem: Activity: Goal: Risk for activity intolerance will decrease Outcome: Progressing   Problem: Nutrition: Goal: Adequate nutrition will be maintained Outcome: Progressing   Problem: Coping: Goal: Level of anxiety will decrease Outcome: Progressing   Problem: Elimination: Goal: Will not experience complications related to bowel motility Outcome: Progressing Goal: Will not experience complications related to urinary retention Outcome: Progressing   Problem: Pain Managment: Goal: General experience of comfort will improve Outcome: Progressing   Problem: Safety: Goal: Ability to remain free from injury will improve Outcome: Progressing   Problem: Skin Integrity: Goal: Risk for impaired skin integrity will decrease Outcome: Progressing   Problem: Activity: Goal: Ability to tolerate increased activity will improve Outcome: Progressing   Problem: Respiratory: Goal: Ability to maintain a clear airway and adequate ventilation will improve Outcome: Progressing   Problem: Role Relationship: Goal: Method of communication will improve Outcome: Progressing

## 2019-08-26 NOTE — Progress Notes (Signed)
Physical Therapy Treatment Patient Details Name: Haley Hernandez MRN: [id number] DOB: 20-Dec-1960 Today's Date: 08/26/2019    History of Present Illness 59 y.o. female with past medical history of intracerebral hemorrhage 7 years ago, hypertension presents to the emergency department after family found her unresponsive with L hemipleiga and R gaze deviation. 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 Pt intubated and with EVD placed on 3/15. EVD removed 3/24. tracheostomy 3/27; EEG 3/29 moderate diffuse encephalopathy with no seizures (multiple events of RUE twitching occurred during EEG without EEG changes)    PT Comments    Patient participated minimally in terms of following commands today, but was able to sit at times with S and did attempt to assist her to stand with limited participation.  She remains appropriate for SNF vs LTACH.  PT to follow.    Follow Up Recommendations  SNF;LTACH;Supervision/Assistance - 24 hour     Equipment Recommendations  Other (comment)(TBA)    Recommendations for Other Services       Precautions / Restrictions Precautions Precautions: Fall Precaution Comments: Trach collar, 5L 28%FiO2. SBP<160, HOB>30 degrees. PEG.    Mobility  Bed Mobility Overal bed mobility: Needs Assistance Bed Mobility: Rolling;Sidelying to Sit Rolling: Max assist;+2 for physical assistance Sidelying to sit: Max assist;+2 for physical assistance     Sit to sidelying: Max assist;+2 for physical assistance General bed mobility comments: helping some using cues, but no initiation with commands given and extra time  Transfers Overall transfer level: Needs assistance   Transfers: Sit to/from Stand Sit to Stand: +2 physical assistance;Total assist         General transfer comment: assempted forward weight shift and liftoff from EOB, but pt not initiating so returned to EOB  Ambulation/Gait                  Stairs             Wheelchair Mobility    Modified Rankin (Stroke Patients Only) Modified Rankin (Stroke Patients Only) Modified Rankin: Severe disability     Balance Overall balance assessment: Needs assistance Sitting-balance support: Feet supported;Bilateral upper extremity supported Sitting balance-[name]: Poor Sitting balance - Comments: EOB initially min to mod A of 2, progressing to S at times with cues for head up, but pt with limited head righting                                    Cognition Arousal/Alertness: Awake/alert Behavior During Therapy: Flat affect Overall Cognitive Status: Impaired/Different from baseline Area of Impairment: Attention;Following commands                   Current Attention Level: Focused   Following Commands: Follows one step commands inconsistently;Follows one step commands with increased time       General Comments: not following commands much for me today      Exercises      General Comments General comments (skin integrity, edema, etc.): VSS      Pertinent Vitals/Pain Pain Assessment: Faces Faces Pain Scale: No hurt    Home Living                      Prior Function            PT Goals (current goals can now be found in the care plan section)  Frequency    Min 3X/week      PT Plan Current plan remains appropriate    Co-evaluation              AM-PAC PT "6 Clicks" Mobility   Outcome Measure  Help needed turning from your back to your side while in a flat bed without using bedrails?: Total Help needed moving from lying on your back to sitting on the side of a flat bed without using bedrails?: Total Help needed moving to and from a bed to a chair (including a wheelchair)?: Total Help needed standing up from a chair using your arms (e.g., wheelchair or bedside chair)?: Total Help needed to walk in hospital room?: Total Help needed climbing 3-5 steps  with a railing? : Total 6 Click Score: 6    End of Session Equipment Utilized During Treatment: Oxygen(trach collar) Activity Tolerance: Patient limited by fatigue Patient left: in bed;with call bell/phone within reach   PT Visit Diagnosis: Other symptoms and signs involving the nervous system (R29.898);Muscle weakness (generalized) (M62.81);Hemiplegia and hemiparesis Hemiplegia - Right/Left: Left Hemiplegia - caused by: Nontraumatic intracerebral hemorrhage     Time: 1420-1440 PT Time Calculation (min) (ACUTE ONLY): 20 min  Charges:  $Therapeutic Activity: 8-22 mins                     Haley Hernandez, [place] [facility] [phone] 08/26/2019    Haley Hernandez 08/26/2019, 5:34 PM

## 2019-08-26 NOTE — Evaluation (Signed)
Clinical/Bedside Swallow Evaluation Patient Details  Name: Haley Hernandez MRN: [id number] Date of Birth: 01-13-1961  Today's Date: 08/26/2019 Time: SLP Start Time (ACUTE ONLY): 1451 SLP Stop Time (ACUTE ONLY): 1503 SLP Time Calculation (min) (ACUTE ONLY): 12 min  Past Medical History: History reviewed. No pertinent past medical history. Past Surgical History:  Past Surgical History:  Procedure Laterality Date  . IR GASTROSTOMY TUBE MOD SED  08/03/2019   HPI:  59 y.o. female with past medical history of intracerebral hemorrhage 7 years ago, hypertension presents to the emergency department after family found her unresponsive with L hemiplegia and R gaze deviation. 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 Pt intubated and with EVD placed on 3/15. EVD removed 3/24. tracheostomy 3/27; EEG 3/29 moderate diffuse encephalopathy with no seizures (multiple events of RUE twitching occurred during EEG without EEG changes).    Assessment / Plan / Recommendation Clinical Impression  Pt was seen for a bedside swallow evaluation and she presents with suspected cognitive-based dysphagia and oropharyngeal dysphagia.  Pt was awake/alert and PMV was donned for all PO trials.  Pt was unable to follow all commands to complete an oral mechanism exam, but reduced labial strength was noted on the left.  Pt was seen with minimal trials of thin liquid and an ice chip.  Pt exhibited poor bolus acceptance with reduced labial opening and closure around the spoon/straw.  Reduced lingual manipulation and R anterior labial spillage was noted with the ice chip and thin liquid trial via tsp.  Bolus expectoration was also noted with the tsp of thin liquid.  Swallow initiation was not noted with ice chip or thin liquid trials.  In an attempt to help the pt trigger a swallow, SLP used siphoned straw sip to position bolus further back in the pt's oral cavity.  Pt still did not  achieve swallow initiation and she exhibited a strong, prolonged cough which resulted in her PMV popping off.  Recommend continuation of NPO with frequent oral care and alternative means of nutrition.  SLP will f/u to determine readiness for instrumental swallow study.  SLP Visit Diagnosis: Aphonia (R49.1)    Aspiration Risk       Diet Recommendation Alternative means - long-term;NPO   Medication Administration: Via alternative means    Other  Recommendations Oral Care Recommendations: Oral care QID;Staff/trained caregiver to provide oral care Other Recommendations: Have oral suction available   Follow up Recommendations Skilled Nursing facility;LTACH;24 hour supervision/assistance      Frequency and Duration min 2x/week  2 weeks       Prognosis Prognosis for Safe Diet Advancement: Fair Barriers to Reach Goals: Cognitive deficits      Swallow Study   General HPI: 59 y.o. female with past medical history of intracerebral hemorrhage 7 years ago, hypertension presents to the emergency department after family found her unresponsive with L hemipleiga and R gaze deviation. 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 Pt intubated and with EVD placed on 3/15. EVD removed 3/24. tracheostomy 3/27; EEG 3/29 moderate diffuse encephalopathy with no seizures (multiple events of RUE twitching occurred during EEG without EEG changes) Type of Study: Bedside Swallow Evaluation Previous Swallow Assessment: None  Diet Prior to this Study: NPO;PEG tube Temperature Spikes Noted: Yes Respiratory Status: Trach Collar History of Recent Intubation: Yes Length of Intubations (days): 11 days Date extubated: 08/30/19 Behavior/Cognition: Alert;Cooperative;Requires cueing Oral Cavity Assessment: Dry Oral Care Completed by SLP: No Oral  Cavity - Dentition: Adequate natural dentition Self-Feeding Abilities: Total assist Patient Positioning: Upright in  chair Baseline Vocal Quality: Low vocal intensity(with PMV donned ) Volitional Cough: Strong Volitional Swallow: Unable to elicit    Oral/Motor/Sensory Function Overall Oral Motor/Sensory Function: Other (comment)(Pt was unable to follow all commands for OME )   Ice Chips Ice chips: Impaired Presentation: Spoon Oral Phase Impairments: Reduced labial seal;Reduced lingual movement/coordination Oral Phase Functional Implications: Left anterior spillage Pharyngeal Phase Impairments: Unable to trigger swallow   Thin Liquid Thin Liquid: Impaired Presentation: Straw;Spoon Oral Phase Impairments: Reduced labial seal Oral Phase Functional Implications: Oral holding;Left anterior spillage Pharyngeal  Phase Impairments: Unable to trigger swallow(expectoration of bolus )    Nectar Thick Nectar Thick Liquid: Not tested   Honey Thick Honey Thick Liquid: Not tested   Puree Puree: Not tested   Solid     Solid: Not tested     Haley Hernandez M.S., CCC-SLP Acute Rehabilitation Services Office: [phone]  Haley Hernandez 08/26/2019,3:44 PM

## 2019-08-27 LAB — GLUCOSE, CAPILLARY
Glucose-Capillary: 101 mg/dL — ABNORMAL HIGH (ref 70–99)
Glucose-Capillary: 109 mg/dL — ABNORMAL HIGH (ref 70–99)
Glucose-Capillary: 112 mg/dL — ABNORMAL HIGH (ref 70–99)
Glucose-Capillary: 112 mg/dL — ABNORMAL HIGH (ref 70–99)
Glucose-Capillary: 119 mg/dL — ABNORMAL HIGH (ref 70–99)
Glucose-Capillary: 160 mg/dL — ABNORMAL HIGH (ref 70–99)
Glucose-Capillary: 181 mg/dL — ABNORMAL HIGH (ref 70–99)

## 2019-08-27 NOTE — Progress Notes (Signed)
[facility] Progress Note  Patient: Haley Hernandez    YIF:[id number]  DOA: 07/18/2019     Date of Service: the patient was seen and examined on 08/27/2019  Chief Complaint  Patient presents with  . Code Stroke   Brief hospital course: Past medical history of HTN, OSA, remote history of ICH.  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  Patient was intubated in the ED.  Neurosurgery was consulted and EVD was placed.  PCCM was consulted for vent management.  IR was consulted for G-tube placement.  ENT was consulted for tracheostomy tube placement.  Currently further plan is arranged for discharge.  Assessment and Plan: Hemorrhagic stroke  Hypertensive urgency. Treated with external ventricular drainage.  Has hx of ICH 7 yrs ago.   Seen by neurology  continue PT OT supportive care.   Avoid anticoagulation.  Acute respiratory failure/history of OSA: SP tracheostomy patient was intubated to protect airway for ICH, now has tracheostomy in place since March 27 and PEG in place  continue supportive care, secretion management. Doing well without trach collar,  changed to cuffless size 6 tracheostomy,  PCCM following  re-involving speech-language pathology,  PCCM seeing weekly-and plans to follow-up in trach clinic post discharge. Secretions remains an issue.  Per speech therapy "Recommend PMV only be worn with SLP at this time."  Angioedema involving tongue with swelling, seen by ENT,  advised bite-block:  However patient's tongue was so swollen that bite block could not be inserted,  was placed on IV steroids with significant improvement.   Switch to oral prednisone by PCCM.  We will continue 5-day taper. Off Benadryl. Amlodipine/losartan has been discontinued as a potential etiology.  Dysphagia. On Tube feed  insulin Lantus 25 units and sliding scale q4h. Sugar stable, last hba1c 5.7 on  07/19/19.  Adjust insulin as steroid is being weaned off  Hypertension  blood pressure well controlled on hydralazine and labetalol. Hold chlorthalidone due to hypercalcemia. Amlodipine and losartan has been discontinued due to angioedema. Continue to monitor closely.  Mild hypercalcemia. Discontinue chlorthalidone.  Will monitor.  Acute metabolic encephalopathy: improved. She has been much more alert awake. Continue on supportive care.   Last CT head on 4/5 showed continued improvement in the right thalamic hemorrhage, a small amount of ventricular hemorrhage with improvement, mild ventricular enlargement unchanged mild midline shift to the left slightly improved D/w Neurosurgery recommended outpatient follow up in 4-6 weeks.  Pneumonia/bronchitis,  was treated for pneumonia and completed antibiotic courses multiple rounds.   Anemia from acute illness monitor hemoglobin overall stable.  Goals of care: Seen by palliative care family wishes to continue full medical care.   Diet: On tube feedings DVT Prophylaxis: SCD, pharmacological prophylaxis contraindicated due to Due to intracranial bleeding   Advance goals of care discussion: Full code  Family Communication: no family was present at bedside, at the time of interview.   Disposition:  Status is: Inpatient  Remains inpatient appropriate because:Unsafe d/c plan and Patient with tracheostomy and PEG tube placement with secretion issues.   Dispo: The patient is from: Home              Anticipated d/c is to: LTAC              Anticipated d/c date is: 1 day              Patient currently is medically stable to d/c.  Subjective:  No acute complaint no nausea no vomiting.  No fever no chills.  No chest pain abdominal pain.  Still has mittens.  Physical Exam: General:  alert, nonverbal, difficult to assess orientation  Appear in mild distress, affect flat in affect Eyes: PERRL ENT: Oral Mucosa Clear, moist  Neck: difficult to  assess  JVD,  Cardiovascular: S1 and S2 Present, no Murmur,  Respiratory: good respiratory effort, Bilateral Air entry equal and Decreased, faint basal Crackles, no wheezes Abdomen: Bowel Sound present, Soft and difficult to assess  tenderness,  Skin: no rash Extremities: no Pedal edema, no calf tenderness Neurologic: Active and purposeful movement of the right upper and lower extremity.  Left-sided neglect.  Inability to visually track past midline. Gait not checked due to patient safety concerns  Vitals:   08/27/19 1428 08/27/19 1528 08/27/19 1551 08/27/19 1705  BP: (!) 130/91  131/87 112/85  Pulse:  (!) 103 (!) 107 (!) 105  Resp:  17 20   Temp:   99 F (37.2 C)   TempSrc:   Axillary   SpO2:  95% 95%   Weight:      Height:        Intake/Output Summary (Last 24 hours) at 08/27/2019 1741 Last data filed at 08/27/2019 1602 Gross per 24 hour  Intake -  Output 1150 ml  Net -1150 ml   Filed Weights   08/24/19 0500 08/25/19 0357 08/27/19 0500  Weight: 57 kg 65.5 kg 56.6 kg    Data Reviewed: I have personally reviewed and interpreted daily labs, tele strips, imagings as discussed above. I reviewed all nursing notes, pharmacy notes, vitals, pertinent old records I have discussed plan of care as described above with RN and patient/family.  CBC: Recent Labs  Lab 08/22/19 0905 08/23/19 0507 08/24/19 0507 08/25/19 0500 08/26/19 0416  WBC 11.8* 9.1 8.2 8.9 8.0  HGB 9.5* 9.2* 9.3* 8.9* 9.1*  HCT 30.4* 28.8* 29.2* 28.2* 29.0*  MCV 89.9 87.8 89.3 89.2 89.0  PLT 173 168 159 144* 832*   Basic Metabolic Panel: Recent Labs  Lab 08/22/19 0905 08/23/19 0507 08/24/19 0507 08/25/19 0500 08/26/19 0416  NA 136 136 135 135 136  K 3.9 3.7 3.8 3.7 3.4*  CL 96* 97* 97* 95* 98  CO2 32 32 32 31 31  GLUCOSE 144* 106* 109* 124* 114*  BUN 23* 21* '19 16 15  ' CREATININE 0.51 0.49 0.53 0.42* 0.50  CALCIUM 10.2 10.2 10.2 10.5* 10.6*  MG 1.9  --   --   --   --   PHOS 3.1  --   --   --    --     Studies: No results found.  Scheduled Meds: . chlorhexidine gluconate (MEDLINE KIT)  15 mL Mouth Rinse BID  . Chlorhexidine Gluconate Cloth  6 each Topical Daily  . famotidine  20 mg Per Tube BID  . free water  140 mL Per Tube Q6H  . guaiFENesin  10 mL Per Tube BID  . hydrALAZINE  100 mg Per Tube Q8H  . insulin aspart  0-20 Units Subcutaneous Q4H  . insulin aspart  7 Units Subcutaneous Q4H  . insulin glargine  24 Units Subcutaneous Daily  . labetalol  200 mg Per Tube TID  . magic mouthwash w/lidocaine  5 mL Oral TID  . mouth rinse  15 mL Mouth Rinse 10 times per day  . predniSONE  20 mg Per Tube Daily  . sodium chloride flush  10-40 mL Intracatheter Q12H  .  triamcinolone   Mouth/Throat TID   Continuous Infusions: . feeding supplement (JEVITY 1.2 CAL) 1,000 mL (08/27/19 0547)   PRN Meds: acetaminophen **OR** acetaminophen (TYLENOL) oral liquid 160 mg/5 mL **OR** acetaminophen, albuterol, glycopyrrolate, labetalol, loperamide HCl, oxyCODONE, sodium chloride flush  Time spent: 35 minutes  Author: Berle Mull, MD [facility] 08/27/2019 5:41 PM  To reach On-call, see care teams to locate the attending and reach out to them via [email or web address]. If 7PM-7AM, please contact night-coverage If you still have difficulty reaching the attending provider, please page the [facility] (Director on Call) for [facility] on amion for assistance.

## 2019-08-28 LAB — GLUCOSE, CAPILLARY
Glucose-Capillary: 94 mg/dL (ref 70–99)
Glucose-Capillary: 139 mg/dL — ABNORMAL HIGH (ref 70–99)
Glucose-Capillary: 154 mg/dL — ABNORMAL HIGH (ref 70–99)
Glucose-Capillary: 192 mg/dL — ABNORMAL HIGH (ref 70–99)
Glucose-Capillary: 135 mg/dL — ABNORMAL HIGH (ref 70–99)

## 2019-08-28 NOTE — Progress Notes (Signed)
[facility] Progress Note  Patient: Haley Hernandez    CBS:[id number]  DOA: 07/18/2019     Date of Service: the patient was seen and examined on 08/28/2019  Chief Complaint  Patient presents with  . Code Stroke   Brief hospital course: Past medical history of HTN, OSA, remote history of ICH.  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  Patient was intubated in the ED.  Neurosurgery was consulted and EVD was placed.  PCCM was consulted for vent management.  IR was consulted for G-tube placement.  ENT was consulted for tracheostomy tube placement.  Currently further plan is arranged for discharge.  Assessment and Plan: Hemorrhagic stroke  Hypertensive urgency. Treated with external ventricular drainage.  Has hx of ICH 7 yrs ago.   Seen by neurology  continue PT OT supportive care.   Avoid anticoagulation.  Acute respiratory failure/history of OSA: SP tracheostomy patient was intubated to protect airway for ICH, now has tracheostomy in place since March 27 and PEG in place  continue supportive care, secretion management. Doing well with trach collar,  changed to cuffless size 6 tracheostomy,  PCCM following  re-involving speech-language pathology,  PCCM seeing weekly-and plans to follow-up in trach clinic post discharge. Secretions remains an issue.  Per speech therapy "Recommend PMV only be worn with SLP at this time."  Angioedema involving tongue with swelling, seen by ENT,  advised bite-block:  However patient's tongue was so swollen that bite block could not be inserted,  was placed on IV steroids with significant improvement.   Switch to oral prednisone by PCCM.  We will continue 5-day taper. Off Benadryl. Amlodipine/losartan has been discontinued as a potential etiology.  Dysphagia. On Tube feed  insulin Lantus 25 units and sliding scale q4h. Sugar stable, last hba1c 5.7 on  07/19/19.  Adjust insulin as steroid is being weaned off  Hypertension  blood pressure well controlled on hydralazine and labetalol. Hold chlorthalidone due to hypercalcemia. Amlodipine and losartan has been discontinued due to angioedema. Continue to monitor closely.  Mild hypercalcemia. Discontinue chlorthalidone.  Will monitor.  Acute metabolic encephalopathy: improved. She has been much more alert awake. Continue on supportive care.   Last CT head on 4/5 showed continued improvement in the right thalamic hemorrhage, a small amount of ventricular hemorrhage with improvement, mild ventricular enlargement unchanged mild midline shift to the left slightly improved D/w Neurosurgery recommended outpatient follow up in 4-6 weeks.  Pneumonia/bronchitis,  was treated for pneumonia and completed antibiotic courses multiple rounds.   Anemia from acute illness monitor hemoglobin overall stable.  Goals of care: Seen by palliative care family wishes to continue full medical care.   Diet: On tube feedings DVT Prophylaxis: SCD, pharmacological prophylaxis contraindicated due to Due to intracranial bleeding   Advance goals of care discussion: Full code  Family Communication: no family was present at bedside, at the time of interview.   Disposition:  Status is: Inpatient  Remains inpatient appropriate because:Unsafe d/c plan and Patient with tracheostomy and PEG tube placement with secretion issues.   Dispo: The patient is from: Home              Anticipated d/c is to: LTAC              Anticipated d/c date is: 1 day              Patient currently is medically stable to d/c.  Subjective:  Still has some secretion.  No nausea no vomiting.  No fever no chills.  Requiring suctioning.  Physical Exam: General:  alert, nonverbal, difficult to assess orientation  Appear in mild distress, affect flat in affect Eyes: PERRL ENT: Oral Mucosa Clear, moist  Neck: difficult to assess  JVD,   Cardiovascular: S1 and S2 Present, no Murmur,  Respiratory: good respiratory effort, Bilateral Air entry equal and Decreased, faint basal Crackles, no wheezes Abdomen: Bowel Sound present, Soft and difficult to assess  tenderness,  Skin: no rash Extremities: no Pedal edema, no calf tenderness Neurologic: Active and purposeful movement of the right upper and lower extremity.  Left-sided neglect.  Inability to visually track past midline. Gait not checked due to patient safety concerns  Vitals:   08/28/19 1123 08/28/19 1140 08/28/19 1327 08/28/19 1529  BP: 113/79 113/79 117/77 116/76  Pulse: 97 86  96  Resp: (!) '21 18  19  ' Temp: 98.3 F (36.8 C)     TempSrc: Axillary     SpO2: 98%   95%  Weight:      Height:        Intake/Output Summary (Last 24 hours) at 08/28/2019 1726 Last data filed at 08/28/2019 0600 Gross per 24 hour  Intake 2160 ml  Output 200 ml  Net 1960 ml   Filed Weights   08/25/19 0357 08/27/19 0500 08/28/19 0411  Weight: 65.5 kg 56.6 kg 57.5 kg    Data Reviewed: I have personally reviewed and interpreted daily labs, tele strips, imagings as discussed above. I reviewed all nursing notes, pharmacy notes, vitals, pertinent old records I have discussed plan of care as described above with RN and patient/family.  CBC: Recent Labs  Lab 08/22/19 0905 08/23/19 0507 08/24/19 0507 08/25/19 0500 08/26/19 0416  WBC 11.8* 9.1 8.2 8.9 8.0  HGB 9.5* 9.2* 9.3* 8.9* 9.1*  HCT 30.4* 28.8* 29.2* 28.2* 29.0*  MCV 89.9 87.8 89.3 89.2 89.0  PLT 173 168 159 144* 226*   Basic Metabolic Panel: Recent Labs  Lab 08/22/19 0905 08/23/19 0507 08/24/19 0507 08/25/19 0500 08/26/19 0416  NA 136 136 135 135 136  K 3.9 3.7 3.8 3.7 3.4*  CL 96* 97* 97* 95* 98  CO2 32 32 32 31 31  GLUCOSE 144* 106* 109* 124* 114*  BUN 23* 21* '19 16 15  ' CREATININE 0.51 0.49 0.53 0.42* 0.50  CALCIUM 10.2 10.2 10.2 10.5* 10.6*  MG 1.9  --   --   --   --   PHOS 3.1  --   --   --   --      Studies: No results found.  Scheduled Meds: . chlorhexidine gluconate (MEDLINE KIT)  15 mL Mouth Rinse BID  . Chlorhexidine Gluconate Cloth  6 each Topical Daily  . famotidine  20 mg Per Tube BID  . free water  140 mL Per Tube Q6H  . guaiFENesin  10 mL Per Tube BID  . hydrALAZINE  100 mg Per Tube Q8H  . insulin aspart  0-20 Units Subcutaneous Q4H  . insulin aspart  7 Units Subcutaneous Q4H  . insulin glargine  24 Units Subcutaneous Daily  . labetalol  200 mg Per Tube TID  . magic mouthwash w/lidocaine  5 mL Oral TID  . mouth rinse  15 mL Mouth Rinse 10 times per day  . predniSONE  20 mg Per Tube Daily  . sodium chloride flush  10-40 mL Intracatheter Q12H  . triamcinolone   Mouth/Throat TID  Continuous Infusions: . feeding supplement (JEVITY 1.2 CAL) 1,000 mL (08/28/19 1720)   PRN Meds: acetaminophen **OR** acetaminophen (TYLENOL) oral liquid 160 mg/5 mL **OR** acetaminophen, albuterol, glycopyrrolate, labetalol, loperamide HCl, oxyCODONE, sodium chloride flush  Time spent: 35 minutes  Author: Berle Mull, MD [facility] 08/28/2019 5:26 PM  To reach On-call, see care teams to locate the attending and reach out to them via [email or web address]. If 7PM-7AM, please contact night-coverage If you still have difficulty reaching the attending provider, please page the [facility] (Director on Call) for [facility] on amion for assistance.

## 2019-08-29 LAB — BASIC METABOLIC PANEL
Anion gap: 7 (ref 5–15)
BUN: 15 mg/dL (ref 6–20)
CO2: 30 mmol/L (ref 22–32)
Calcium: 10.4 mg/dL — ABNORMAL HIGH (ref 8.9–10.3)
Chloride: 100 mmol/L (ref 98–111)
Creatinine, Ser: 0.51 mg/dL (ref 0.44–1.00)
GFR calc Af Amer: >60 mL/min (ref >60)
GFR calc non Af Amer: >60 mL/min (ref >60)
Glucose, Bld: 118 mg/dL — ABNORMAL HIGH (ref 70–99)
Potassium: 3.8 mmol/L (ref 3.5–5.1)
Sodium: 137 mmol/L (ref 135–145)

## 2019-08-29 LAB — GLUCOSE, CAPILLARY
Glucose-Capillary: 103 mg/dL — ABNORMAL HIGH (ref 70–99)
Glucose-Capillary: 105 mg/dL — ABNORMAL HIGH (ref 70–99)
Glucose-Capillary: 109 mg/dL — ABNORMAL HIGH (ref 70–99)
Glucose-Capillary: 123 mg/dL — ABNORMAL HIGH (ref 70–99)
Glucose-Capillary: 154 mg/dL — ABNORMAL HIGH (ref 70–99)
Glucose-Capillary: 157 mg/dL — ABNORMAL HIGH (ref 70–99)

## 2019-08-29 LAB — CBC
HCT: 28.4 % — ABNORMAL LOW (ref 36.0–46.0)
Hemoglobin: 9 g/dL — ABNORMAL LOW (ref 12.0–15.0)
MCH: 28.2 pg (ref 26.0–34.0)
MCHC: 31.7 g/dL (ref 30.0–36.0)
MCV: 89 fL (ref 80.0–100.0)
Platelets: 117 10*3/uL — ABNORMAL LOW (ref 150–400)
RBC: 3.19 MIL/uL — ABNORMAL LOW (ref 3.87–5.11)
RDW: 15.6 % — ABNORMAL HIGH (ref 11.5–15.5)
WBC: 6.8 10*3/uL (ref 4.0–10.5)
nRBC: 0 % (ref 0.0–0.2)

## 2019-08-29 NOTE — TOC Progression Note (Signed)
Transition of Care [facility]) - Progression Note    Patient Details  Name: Haley Hernandez MRN: [id number] Date of Birth: June 15, 1960  [facility] CM/SW Contact  Kermit Balo, RN Phone Number: 08/29/2019, 4:14 PM  Clinical Narrative:    Pt has been approved through [facility] for admission to [facility]. [facility] wont have a bed for her until Thursday. MD and daugher: [name] updated.  TOC following.     Barriers to Discharge: Continued Medical Work up, English as a second language teacher, Inadequate or no Clinical research associate and Services       Post Acute Care Choice: Long Term Acute Care (LTAC), Skilled Nursing Facility Living arrangements for the past 2 months: Single Family Home                                       Social Determinants of Health (SDOH) Interventions    Readmission Risk Interventions No flowsheet data found.

## 2019-08-29 NOTE — Procedures (Signed)
Tracheostomy Change Note  Patient Details:   Name: Haley Hernandez DOB: May 21, 1960 MRN: [id number]    Airway Documentation:     Evaluation  O2 sats: stable throughout Complications: No apparent complications Patient did tolerate procedure well. Bilateral Breath Sounds: Diminished    Toula Moos 08/29/2019, 5:36 PM

## 2019-08-29 NOTE — Progress Notes (Signed)
   NAME:  Haley Hernandez, MRN:  [id number], DOB:  [birth year], LOS: 42 ADMISSION DATE:  07/18/2019, CONSULTATION DATE:  07/19/2019 REFERRING MD:  Dr. Laurence Slate, CHIEF COMPLAINT:  ICH  Brief History   59 yo female found with AMS and Lt hemiplegia, SBP 250 with resulting ICH of Rt thalamus and BG with IVH.  Intubated for airway protection.  Required tracheostomy due to altered mental status and tongue swelling.  Past Medical History  HTN, OSA, ICH  Consults:  NSGY Neurology ENT Palliative care  Procedures:  3/15 ETT >> 3/27 Trach 3/28 >> 3/16 PICC >>   Micro Data:  SARS CoV2 PCR 3/15 >> negative Sputum 3/17 >> negative Sputum 3/20 >> negative Blood 3/20 >> negative Blood, urine 3/26 >>negative Blood 4/03 >>   Interim history/subjective:  Doing well this AM.  Denies pain.  Objective   BP 91/65 (BP Location: Left Arm)   Pulse 87   Temp 98.6 F (37 C) (Oral)   Resp 15   Ht 5' (1.524 m)   Wt 57.5 kg   SpO2 97%   BMI 24.76 kg/m   I/O last 3 completed shifts: In: 2160 [NG/GT:2160] Out: 1725 [Urine:1725]  Physical exam: GEN: middle aged woman on TC HEENT: trach in place, small whitish secretions CV: RRR, ext warm PULM: Occasional transmitted upper airway sounds GI: Soft, +BS EXT: No edema NEURO: moves R side briskly to command, L hemiplegia PSYCH: RASS 0 SKIN: no rashes   Assessment & Plan:   Acute hypoxic respiratory failure 2nd to compromised airway in setting of ICH and macroglossia and angioedema Hx of OSA. Tracheostomy status 3/28.   Discussion Looking better, macroglossia improved. Switch to 4-0 cuffless, depending on how she does can consider capping trial Will follow  Myrla Halsted MD PCCM

## 2019-08-29 NOTE — Progress Notes (Signed)
[facility] Progress Note  Patient: Haley Hernandez    BMW:[id number]  DOA: 07/18/2019     Date of Service: the patient was seen and examined on 08/29/2019  Chief Complaint  Patient presents with  . Code Stroke   Brief hospital course: Past medical history of HTN, OSA, remote history of ICH.  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  Patient was intubated in the ED.  Neurosurgery was consulted and EVD was placed.  PCCM was consulted for vent management.  IR was consulted for G-tube placement.  ENT was consulted for tracheostomy tube placement. 3/15 admitted, intubated, ventriculostomy placed 3/23 ventriculostomy removed. 3/28 tracheostomy done 3/31 IR guided PEG tube placement 4/5 ENT consulted for macroglossia started on steroids  Currently further plan is arranged for discharge and continue to progress the patient with tracheostomy care.  Assessment and Plan: Hemorrhagic stroke  Hypertensive urgency. Treated with external ventricular drainage.  Has hx of ICH 7 yrs ago.   Seen by neurology  continue PT OT supportive care.   Avoid anticoagulation.  Acute respiratory failure/history of OSA: SP tracheostomy patient was intubated to protect airway for ICH, now has tracheostomy in place since March 27 and PEG in place  continue supportive care, secretion management. Doing well with trach collar,  changed to cuffless size 6 tracheostomy. PCCM following  PCCM seeing weekly-and plans to follow-up in trach clinic post discharge. Secretions remains an issue.  Per speech therapy "Recommend PMV only be worn with SLP at this time."  Angioedema involving tongue with swelling, seen by ENT,  advised bite-block:  However patient's tongue was so swollen that bite block could not be inserted,  was placed on IV steroids with significant improvement.   Switch to oral prednisone by PCCM.  We will  continue 5-day taper. Off Benadryl. Amlodipine/losartan has been discontinued as a potential etiology.  Dysphagia. On Tube feed  insulin Lantus 25 units and sliding scale q4h. Sugar stable, last hba1c 5.7 on 07/19/19.  Adjust insulin as steroid is being weaned off  Hypertension  blood pressure well controlled on hydralazine and labetalol. Hold chlorthalidone due to hypercalcemia. Amlodipine and losartan has been discontinued due to angioedema. Continue to monitor closely.  Mild hypercalcemia. Discontinue chlorthalidone.  Will monitor.  Acute metabolic encephalopathy: improved. She has been much more alert awake. Continue on supportive care.   Last CT head on 4/5 showed continued improvement in the right thalamic hemorrhage, a small amount of ventricular hemorrhage with improvement, mild ventricular enlargement unchanged mild midline shift to the left slightly improved D/w Neurosurgery recommended outpatient follow up in 4-6 weeks.  Pneumonia/bronchitis,  was treated for pneumonia and completed antibiotic courses multiple rounds.   Anemia from acute illness monitor hemoglobin overall stable.  Goals of care: Seen by palliative care family wishes to continue full medical care.   Diet: On tube feedings DVT Prophylaxis: SCD, pharmacological prophylaxis contraindicated due to Due to intracranial bleeding   Advance goals of care discussion: Full code  Family Communication: no family was present at bedside, at the time of interview.  Discussed with the daughter on Saturday.  Disposition:  Status is: Inpatient  Remains inpatient appropriate because:Unsafe d/c plan and Patient with tracheostomy and PEG tube placement with secretion issues.   Dispo: The patient is from: Home              Anticipated d/c is to: LTAC  Anticipated d/c date is: 1 day              Patient currently is medically stable to d/c.  Subjective: No acute complaint no nausea no vomiting.  No acute  events.  Still on and off agitation.  Still on and off secretion issues requiring suctioning  Physical Exam: General:  alert, nonverbal, difficult to assess orientation  Appear in mild distress, affect flat in affect Eyes: PERRL ENT: Oral Mucosa Clear, moist  Neck: difficult to assess  JVD,  Cardiovascular: S1 and S2 Present, no Murmur,  Respiratory: good respiratory effort, Bilateral Air entry equal and Decreased, faint basal Crackles, no wheezes Abdomen: Bowel Sound present, Soft and difficult to assess  tenderness,  Skin: no rash Extremities: no Pedal edema, no calf tenderness Neurologic: Active and purposeful movement of the right upper and lower extremity.  Left-sided neglect.  Inability to visually track past midline. Gait not checked due to patient safety concerns  Vitals:   08/29/19 0724 08/29/19 0851 08/29/19 1124 08/29/19 1222  BP: 91/65  124/85   Pulse: 86 87 91 87  Resp: '17 15 20 16  ' Temp: 98.6 F (37 C)  (!) 97.4 F (36.3 C)   TempSrc: Oral  Oral   SpO2: 94% 97% 95% 96%  Weight:      Height:        Intake/Output Summary (Last 24 hours) at 08/29/2019 1500 Last data filed at 08/29/2019 5361 Gross per 24 hour  Intake -  Output 1525 ml  Net -1525 ml   Filed Weights   08/25/19 0357 08/27/19 0500 08/28/19 0411  Weight: 65.5 kg 56.6 kg 57.5 kg    Data Reviewed: I have personally reviewed and interpreted daily labs, tele strips, imagings as discussed above. I reviewed all nursing notes, pharmacy notes, vitals, pertinent old records I have discussed plan of care as described above with RN and patient/family.  CBC: Recent Labs  Lab 08/23/19 0507 08/24/19 0507 08/25/19 0500 08/26/19 0416 08/29/19 0445  WBC 9.1 8.2 8.9 8.0 6.8  HGB 9.2* 9.3* 8.9* 9.1* 9.0*  HCT 28.8* 29.2* 28.2* 29.0* 28.4*  MCV 87.8 89.3 89.2 89.0 89.0  PLT 168 159 144* 144* 443*   Basic Metabolic Panel: Recent Labs  Lab 08/23/19 0507 08/24/19 0507 08/25/19 0500 08/26/19 0416  08/29/19 0445  NA 136 135 135 136 137  K 3.7 3.8 3.7 3.4* 3.8  CL 97* 97* 95* 98 100  CO2 32 32 '31 31 30  ' GLUCOSE 106* 109* 124* 114* 118*  BUN 21* '19 16 15 15  ' CREATININE 0.49 0.53 0.42* 0.50 0.51  CALCIUM 10.2 10.2 10.5* 10.6* 10.4*    Studies: No results found.  Scheduled Meds: . chlorhexidine gluconate (MEDLINE KIT)  15 mL Mouth Rinse BID  . Chlorhexidine Gluconate Cloth  6 each Topical Daily  . famotidine  20 mg Per Tube BID  . free water  140 mL Per Tube Q6H  . guaiFENesin  10 mL Per Tube BID  . hydrALAZINE  100 mg Per Tube Q8H  . insulin aspart  0-20 Units Subcutaneous Q4H  . insulin aspart  7 Units Subcutaneous Q4H  . insulin glargine  24 Units Subcutaneous Daily  . labetalol  200 mg Per Tube TID  . magic mouthwash w/lidocaine  5 mL Oral TID  . mouth rinse  15 mL Mouth Rinse 10 times per day  . predniSONE  20 mg Per Tube Daily  . sodium chloride flush  10-40 mL Intracatheter Q12H  .  triamcinolone   Mouth/Throat TID   Continuous Infusions: . feeding supplement (JEVITY 1.2 CAL) 1,000 mL (08/29/19 1211)   PRN Meds: acetaminophen **OR** acetaminophen (TYLENOL) oral liquid 160 mg/5 mL **OR** acetaminophen, albuterol, glycopyrrolate, labetalol, loperamide HCl, oxyCODONE, sodium chloride flush  Time spent: 35 minutes  Author: Berle Mull, MD [facility] 08/29/2019 3:00 PM  To reach On-call, see care teams to locate the attending and reach out to them via [email or web address]. If 7PM-7AM, please contact night-coverage If you still have difficulty reaching the attending provider, please page the [facility] (Director on Call) for [facility] on amion for assistance.

## 2019-08-30 ENCOUNTER — Inpatient Hospital Stay (HOSPITAL_COMMUNITY): Payer: 59

## 2019-08-30 LAB — GLUCOSE, CAPILLARY
Glucose-Capillary: 101 mg/dL — ABNORMAL HIGH (ref 70–99)
Glucose-Capillary: 111 mg/dL — ABNORMAL HIGH (ref 70–99)
Glucose-Capillary: 115 mg/dL — ABNORMAL HIGH (ref 70–99)
Glucose-Capillary: 119 mg/dL — ABNORMAL HIGH (ref 70–99)
Glucose-Capillary: 132 mg/dL — ABNORMAL HIGH (ref 70–99)
Glucose-Capillary: 147 mg/dL — ABNORMAL HIGH (ref 70–99)
Glucose-Capillary: 64 mg/dL — ABNORMAL LOW (ref 70–99)
Glucose-Capillary: 99 mg/dL (ref 70–99)

## 2019-08-30 MED ORDER — INSULIN ASPART 100 UNIT/ML ~~LOC~~ SOLN
3.0000 [IU] | SUBCUTANEOUS | Status: DC
  Administered 2019-08-30 – 2019-08-31 (×5): 3 [IU] via SUBCUTANEOUS

## 2019-08-30 MED ORDER — DEXTROSE 50 % IV SOLN
12.5000 g | INTRAVENOUS | Status: AC
  Administered 2019-08-30: 12.5 g via INTRAVENOUS
  Filled 2019-08-30: qty 50

## 2019-08-30 MED ORDER — PRO-STAT SUGAR FREE PO LIQD
30.0000 mL | Freq: Every day | ORAL | Status: DC
  Administered 2019-08-30 – 2019-08-31 (×2): 30 mL via ORAL
  Filled 2019-08-30 (×2): qty 30

## 2019-08-30 MED ORDER — FREE WATER
110.0000 mL | Status: DC
  Administered 2019-08-30 – 2019-08-31 (×7): 110 mL

## 2019-08-30 MED ORDER — INSULIN ASPART 100 UNIT/ML ~~LOC~~ SOLN
0.0000 [IU] | Freq: Four times a day (QID) | SUBCUTANEOUS | Status: DC
  Administered 2019-08-31: 1 [IU] via SUBCUTANEOUS
  Administered 2019-08-31: 2 [IU] via SUBCUTANEOUS

## 2019-08-30 MED ORDER — OXYCODONE HCL 5 MG PO TABS
5.0000 mg | ORAL_TABLET | Freq: Once | ORAL | Status: AC
  Administered 2019-08-30: 5 mg via ORAL
  Filled 2019-08-30: qty 1

## 2019-08-30 NOTE — Plan of Care (Signed)
  Problem: Coping: Goal: Will verbalize positive feelings about self Outcome: Progressing Goal: Will identify appropriate support needs Outcome: Progressing   Problem: Nutrition: Goal: Risk of aspiration will decrease Outcome: Progressing   Problem: Intracerebral Hemorrhage Tissue Perfusion: Goal: Complications of Intracerebral Hemorrhage will be minimized Outcome: Progressing
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
## 2019-08-30 NOTE — Progress Notes (Signed)
Modified Barium Swallow Progress Note  Patient Details  Name: Haley Hernandez MRN: [id number] Date of Birth: 16-Feb-1961  Today's Date: 08/30/2019  Modified Barium Swallow completed.  Full report located under Chart Review in the Imaging Section.  Brief recommendations include the following:  Clinical Impression  Pt presents with a primary oral, secondary cervical esophageal dysphagia.  Pharyngeal function, including bolus propulsion and laryngeal vestibule closure (LVC), were remarkably functional.  Oral control, labial seal, and containment of bolus were the most problematic with significant spillage from left oral cavity and reduced awareness.  Pt did not want to eat the pureed barium, stating "it's nasty," but was ultimately willing to eat two spoonsful.  Once all materials reached valleculae/pyriforms, a swallow was triggered, there was effective LVC, and material traversed easily through UES.  Notable was inconsistent backflow of all consistencies from the cervical esophagus into the pyriforms - on one occasion, there was trace spillage of this backflow into the posterior larynx, triggering a cough response.  Pt is ready to begin PO trials initially with SLP, then should be able to progress to eating small amounts with trained family/staff. Concerns are less for aspiration during the swallow than backflow of contents of esophagus.  Therapeutic intervention should focus primarily on oral sensorimotor function.     Swallow Evaluation Recommendations       SLP Diet Recommendations: NPO; therapeutic trials with SLP                       Oral Care Recommendations: Oral care QID   Other Recommendations: Have oral suction available  Haley Watson L. Samson Frederic, MA CCC/SLP Acute Rehabilitation Services Office number [phone] Pager [phone]   Blenda Mounts Laurice 08/30/2019,2:18 PM

## 2019-08-30 NOTE — Progress Notes (Signed)
[facility] Progress Note  Patient: Haley Hernandez    GYB:[id number]  DOA: 07/18/2019     Date of Service: the patient was seen and examined on 08/30/2019  Chief Complaint  Patient presents with  . Code Stroke   Brief hospital course: Past medical history of HTN, OSA, remote history of ICH.  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  Patient was intubated in the ED.  Neurosurgery was consulted and EVD was placed.  PCCM was consulted for vent management.  IR was consulted for G-tube placement.  ENT was consulted for tracheostomy tube placement. 3/15 admitted, intubated, ventriculostomy placed 3/23 ventriculostomy removed. 3/28 tracheostomy done 3/31 IR guided PEG tube placement 4/5 ENT consulted for macroglossia started on steroids  Currently further plan is arranged for discharge and continue to progress the patient with tracheostomy care.  Assessment and Plan: Hemorrhagic stroke  Hypertensive urgency. Treated with external ventricular drainage.  Has hx of ICH 7 yrs ago.   Seen by neurology  continue PT OT supportive care.   Avoid anticoagulation.  Acute respiratory failure/history of OSA: SP tracheostomy patient was intubated to protect airway for ICH, now has tracheostomy in place since March 27 and PEG in place  continue supportive care, secretion management. Doing well with trach collar,  changed to cuffless size 6 tracheostomy. Trach de canulated today 4/27.  Working with the speech therapy with Passy-Muir valve and trying to speak. PCCM following   Angioedema involving tongue with swelling, seen by ENT,  advised bite-block:  However patient's tongue was so swollen that bite block could not be inserted,  was placed on IV steroids with significant improvement.   Switch to oral prednisone by PCCM.  We will continue 5-day taper. Off Benadryl. Amlodipine/losartan has been  discontinued as a potential etiology.  Dysphagia. On Tube feed  insulin Lantus 25 units and sliding scale q4h. Sugar stable, decreasing dose of insulin now. last hba1c 5.7 on 07/19/19.  Adjust insulin as steroid is being weaned off  Hypertension  blood pressure well controlled on hydralazine and labetalol. Hold chlorthalidone due to hypercalcemia. Amlodipine and losartan has been discontinued due to angioedema. Continue to monitor closely.  Mild hypercalcemia. Discontinue chlorthalidone.  Will monitor.  Acute metabolic encephalopathy: improved. She has been much more alert awake. Continue on supportive care.   Last CT head on 4/5 showed continued improvement in the right thalamic hemorrhage, a small amount of ventricular hemorrhage with improvement, mild ventricular enlargement unchanged mild midline shift to the left slightly improved D/w Neurosurgery recommended outpatient follow up in 4-6 weeks.  Pneumonia/bronchitis,  was treated for pneumonia and completed antibiotic courses multiple rounds.   Anemia from acute illness monitor hemoglobin overall stable.  Goals of care: Seen by palliative care family wishes to continue full medical care.   Diet: On tube feedings DVT Prophylaxis: SCD, pharmacological prophylaxis contraindicated due to Due to intracranial bleeding   Advance goals of care discussion: Full code  Family Communication: no family was present at bedside,   Disposition:  Status is: Inpatient  Remains inpatient appropriate because:Unsafe d/c plan and Patient with tracheostomy and PEG tube placement with secretion issues.   Dispo: The patient is from: Home              Anticipated d/c is to: LTAC              Anticipated d/c date is: 1 day  Patient currently is medically stable to d/c.  Subjective: No overnight events.  Went for swallow evaluation, did not do well.  Lurline Idol was decannulated.  Passy-Muir valve was fitted , she was able to verbalize  some.    Physical Exam: General:  alert, few words with valve, difficult to assess orientation  Eyes: PERRL ENT: Oral Mucosa Clear, moist  Neck: difficult to assess  JVD,  Cardiovascular: S1 and S2 Present, no Murmur,  Extremities: no Pedal edema, no calf tenderness Neurologic: Active and purposeful movement of the right upper and lower extremity.  Left-sided neglect.  Inability to visually track past midline. Gait not checked due to patient safety concerns  Vitals:   08/30/19 0642 08/30/19 0728 08/30/19 0749 08/30/19 1152  BP: 109/80 132/88 132/88 115/83  Pulse:  96 (!) 101   Resp:  '17 16 12  ' Temp: 98.4 F (36.9 C) 98.7 F (37.1 C)  98.4 F (36.9 C)  TempSrc: Oral Axillary  Axillary  SpO2:  93% 97%   Weight:      Height:        Intake/Output Summary (Last 24 hours) at 08/30/2019 1453 Last data filed at 08/30/2019 0100 Gross per 24 hour  Intake -  Output 900 ml  Net -900 ml   Filed Weights   08/25/19 0357 08/27/19 0500 08/28/19 0411  Weight: 65.5 kg 56.6 kg 57.5 kg    Data Reviewed: I have personally reviewed and interpreted daily labs, tele strips, imagings as discussed above. I reviewed all nursing notes, pharmacy notes, vitals, pertinent old records I have discussed plan of care as described above with RN and patient/family.  CBC: Recent Labs  Lab 08/24/19 0507 08/25/19 0500 08/26/19 0416 08/29/19 0445  WBC 8.2 8.9 8.0 6.8  HGB 9.3* 8.9* 9.1* 9.0*  HCT 29.2* 28.2* 29.0* 28.4*  MCV 89.3 89.2 89.0 89.0  PLT 159 144* 144* 638*   Basic Metabolic Panel: Recent Labs  Lab 08/24/19 0507 08/25/19 0500 08/26/19 0416 08/29/19 0445  NA 135 135 136 137  K 3.8 3.7 3.4* 3.8  CL 97* 95* 98 100  CO2 32 '31 31 30  ' GLUCOSE 109* 124* 114* 118*  BUN '19 16 15 15  ' CREATININE 0.53 0.42* 0.50 0.51  CALCIUM 10.2 10.5* 10.6* 10.4*    Studies: DG Swallowing Func-Speech Pathology  Result Date: 08/30/2019 Objective Swallowing Evaluation: Type of Study: MBS-Modified  Barium Swallow Study  Patient Details Name: Haley Hernandez MRN: [id number] Date of Birth: [birth year] Today's Date: 08/30/2019 Time: SLP Start Time (ACUTE ONLY): 1100 -SLP Stop Time (ACUTE ONLY): 1125 SLP Time Calculation (min) (ACUTE ONLY): 25 min Past Medical History: No past medical history on file. Past Surgical History: Past Surgical History: Procedure Laterality Date . IR GASTROSTOMY TUBE MOD SED  08/03/2019 HPI: 59 y.o. female with past medical history of intracerebral hemorrhage 7 years ago, hypertension presents to the emergency department after family found her unresponsive with L hemipleiga and R gaze deviation. 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 Pt intubated and with EVD placed on 3/15. EVD removed 3/24. tracheostomy 3/27; EEG 3/29 moderate diffuse encephalopathy with no seizures (multiple events of RUE twitching occurred during EEG without EEG changes)  Subjective: Pt was alert and cooperative Assessment / Plan / Recommendation CHL IP CLINICAL IMPRESSIONS 08/30/2019 Clinical Impression Pt presents with a primary oral, secondary cervical esophageal dysphagia.  Pharyngeal function, including bolus propulsion and laryngeal vestibule closure (LVC), were remarkably functional.  Oral control, labial seal, and  containment of bolus were the most problematic with significant spillage from left oral cavity and reduced awareness.  Pt did not want to eat the pureed barium, stating "it's nasty," but was ultimately willing to eat two spoonsful.  Once all materials reached valleculae/pyriforms, a swallow was triggered, there was effective LVC, and material traversed easily through UES.  Notable was inconsistent backflow of all consistencies from the cervical esophagus into the pyriforms - on one occasion, there was trace spillage of this backflow into the posterior larynx, triggering a cough response.  Pt is ready to begin PO trials initially with SLP, then  should be able to progress to eating small amounts with trained family/staff. Concerns are less for aspiration during the swallow than backflow of contents of esophagus.  Therapeutic intervention should focus primarily on oral sensorimotor function.   SLP Visit Diagnosis Dysphagia, oral phase (R13.11);Dysphagia, pharyngoesophageal phase (R13.14) Attention and concentration deficit following -- Frontal lobe and executive function deficit following -- Impact on safety and function Mild aspiration risk   CHL IP TREATMENT RECOMMENDATION 08/30/2019 Treatment Recommendations Therapy as outlined in treatment plan below   Prognosis 08/30/2019 Prognosis for Safe Diet Advancement Good Barriers to Reach Goals -- Barriers/Prognosis Comment -- CHL IP DIET RECOMMENDATION 08/30/2019 SLP Diet Recommendations NPO Liquid Administration via -- Medication Administration -- Compensations -- Postural Changes --   CHL IP OTHER RECOMMENDATIONS 08/30/2019 Recommended Consults -- Oral Care Recommendations Oral care QID Other Recommendations Have oral suction available   CHL IP FOLLOW UP RECOMMENDATIONS 08/30/2019 Follow up Recommendations Skilled Nursing facility   [facility] IP FREQUENCY AND DURATION 08/30/2019 Speech Therapy Frequency (ACUTE ONLY) min 2x/week Treatment Duration 2 weeks      CHL IP ORAL PHASE 08/30/2019 Oral Phase Impaired Oral - Pudding Teaspoon -- Oral - Pudding Cup -- Oral - Honey Teaspoon -- Oral - Honey Cup -- Oral - Nectar Teaspoon -- Oral - Nectar Cup Left anterior bolus loss;Reduced posterior propulsion;Holding of bolus;Left pocketing in lateral sulci;Pocketing in anterior sulcus;Lingual/palatal residue;Piecemeal swallowing;Delayed oral transit;Decreased bolus cohesion;Weak lingual manipulation Oral - Nectar Straw -- Oral - Thin Teaspoon Left anterior bolus loss;Reduced posterior propulsion;Holding of bolus;Left pocketing in lateral sulci;Pocketing in anterior sulcus;Lingual/palatal residue;Piecemeal swallowing;Delayed oral  transit;Decreased bolus cohesion;Weak lingual manipulation Oral - Thin Cup -- Oral - Thin Straw -- Oral - Puree Left anterior bolus loss;Reduced posterior propulsion;Holding of bolus;Left pocketing in lateral sulci;Pocketing in anterior sulcus;Lingual/palatal residue;Piecemeal swallowing;Delayed oral transit;Decreased bolus cohesion;Weak lingual manipulation Oral - Mech Soft -- Oral - Regular -- Oral - Multi-Consistency -- Oral - Pill -- Oral Phase - Comment --  CHL IP PHARYNGEAL PHASE 08/30/2019 Pharyngeal Phase Impaired Pharyngeal- Pudding Teaspoon -- Pharyngeal -- Pharyngeal- Pudding Cup -- Pharyngeal -- Pharyngeal- Honey Teaspoon -- Pharyngeal -- Pharyngeal- Honey Cup -- Pharyngeal -- Pharyngeal- Nectar Teaspoon -- Pharyngeal -- Pharyngeal- Nectar Cup Delayed swallow initiation-pyriform sinuses;Delayed swallow initiation-vallecula Pharyngeal -- Pharyngeal- Nectar Straw -- Pharyngeal -- Pharyngeal- Thin Teaspoon -- Pharyngeal -- Pharyngeal- Thin Cup Delayed swallow initiation-vallecula;Delayed swallow initiation-pyriform sinuses Pharyngeal -- Pharyngeal- Thin Straw -- Pharyngeal -- Pharyngeal- Puree Delayed swallow initiation-vallecula;Delayed swallow initiation-pyriform sinuses Pharyngeal -- Pharyngeal- Mechanical Soft -- Pharyngeal -- Pharyngeal- Regular -- Pharyngeal -- Pharyngeal- Multi-consistency -- Pharyngeal -- Pharyngeal- Pill -- Pharyngeal -- Pharyngeal Comment --  CHL IP CERVICAL ESOPHAGEAL PHASE 08/30/2019 Cervical Esophageal Phase Impaired Pudding Teaspoon -- Pudding Cup -- Honey Teaspoon -- Honey Cup -- Nectar Teaspoon -- Nectar Cup Esophageal backflow into the pharynx Nectar Straw -- Thin Teaspoon -- Thin Cup Esophageal backflow into the pharynx  Thin Straw -- Puree Esophageal backflow into the pharynx Mechanical Soft -- Regular -- Multi-consistency -- Pill -- Cervical Esophageal Comment -- Juan Quam Laurice 08/30/2019, 2:19 PM               Scheduled Meds: . chlorhexidine gluconate (MEDLINE  KIT)  15 mL Mouth Rinse BID  . Chlorhexidine Gluconate Cloth  6 each Topical Daily  . famotidine  20 mg Per Tube BID  . feeding supplement (PRO-STAT SUGAR FREE 64)  30 mL Oral Daily  . free water  110 mL Per Tube Q4H  . guaiFENesin  10 mL Per Tube BID  . hydrALAZINE  100 mg Per Tube Q8H  . insulin aspart  0-9 Units Subcutaneous Q6H  . insulin aspart  3 Units Subcutaneous Q4H  . insulin glargine  24 Units Subcutaneous Daily  . labetalol  200 mg Per Tube TID  . magic mouthwash w/lidocaine  5 mL Oral TID  . mouth rinse  15 mL Mouth Rinse 10 times per day  . predniSONE  20 mg Per Tube Daily  . sodium chloride flush  10-40 mL Intracatheter Q12H  . triamcinolone   Mouth/Throat TID   Continuous Infusions: . feeding supplement (JEVITY 1.2 CAL) 1,000 mL (08/30/19 0631)   PRN Meds: acetaminophen **OR** acetaminophen (TYLENOL) oral liquid 160 mg/5 mL **OR** acetaminophen, albuterol, glycopyrrolate, labetalol, loperamide HCl, oxyCODONE, sodium chloride flush  Time spent: 25 minutes

## 2019-08-30 NOTE — Progress Notes (Addendum)
Physical Therapy Treatment Patient Details Name: Haley Hernandez MRN: [id number] DOB: [birth year] Today's Date: 08/30/2019    History of Present Illness 59 y.o. female with past medical history of intracerebral hemorrhage 7 years ago, hypertension presents to the emergency department after family found her unresponsive with L hemipleiga and R gaze deviation. 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 Pt intubated and with EVD placed on 3/15. EVD removed 3/24. tracheostomy 3/27; EEG 3/29 moderate diffuse encephalopathy with no seizures (multiple events of RUE twitching occurred during EEG without EEG changes)    PT Comments    Patient is making progress toward PT goals. Pt with trach capped today and VSS on RA. Pt verbalizing throughout session and able to communicate needs. Pt is following single step commands consistently and able to transfer OOB to [facility] with max A +2. Pt will continue to benefit from further skilled PT services to maximize independence and safety with mobility.       Follow Up Recommendations  SNF;LTACH;Supervision/Assistance - 24 hour     Equipment Recommendations  Other (comment)(defer to post acute rehab)    Recommendations for Other Services       Precautions / Restrictions Precautions Precautions: Fall Precaution Comments: trach capped at noon on RA, peg tube    Mobility  Bed Mobility Overal bed mobility: Needs Assistance Bed Mobility: Rolling;Supine to Sit;Sit to Supine Rolling: Max assist   Supine to sit: Max assist Sit to supine: Max assist   General bed mobility comments: pt reaching with R UE for bed rail with roll and turning head L . pt requires (A) to elevate trunk off bed and bring bil LE off EOB. pt with return to supine descends on R arm and head toward pillow but requires (A) for bil LE  Transfers Overall transfer level: Needs assistance   Transfers: Stand Pivot Transfers Sit to Stand:  +2 physical assistance;Max assist         General transfer comment: transfered bed to [facility] and back to bed; pt stepping with R LE  Ambulation/Gait                 Stairs             Wheelchair Mobility    Modified Rankin (Stroke Patients Only)       Balance Overall balance assessment: Needs assistance Sitting-balance support: Single extremity supported;Feet supported Sitting balance-[name]: Poor Sitting balance - Comments: pt engages trunk but requires steady (A)                                    Cognition Arousal/Alertness: Awake/alert Behavior During Therapy: Flat affect Overall Cognitive Status: Impaired/Different from baseline                     Current Attention Level: Focused   Following Commands: Follows one step commands consistently       General Comments: pt asking for music, pt able to state RN name Lauryn from memory. pt asking for daughter during session. Pt requesting to void bowels and completed on [facility] appropriately      Exercises      General Comments General comments (skin integrity, edema, etc.): VSS      Pertinent Vitals/Pain Pain Assessment: No/denies pain    Home Living  Prior Function            PT Goals (current goals can now be found in the care plan section) Acute Rehab PT Goals Patient Stated Goal: hear music 97.1 Progress towards PT goals: Progressing toward goals    Frequency    Min 3X/week      PT Plan Current plan remains appropriate    Co-evaluation PT/OT/SLP Co-Evaluation/Treatment: Yes Reason for Co-Treatment: Complexity of the patient's impairments (multi-system involvement);Necessary to address cognition/behavior during functional activity;For patient/therapist safety;To address functional/ADL transfers PT goals addressed during session: Mobility/safety with mobility;Balance OT goals addressed during session: ADL's and  self-care;Strengthening/ROM;Proper use of Adaptive equipment and DME      AM-PAC PT "6 Clicks" Mobility   Outcome Measure  Help needed turning from your back to your side while in a flat bed without using bedrails?: A Lot Help needed moving from lying on your back to sitting on the side of a flat bed without using bedrails?: A Lot Help needed moving to and from a bed to a chair (including a wheelchair)?: A Lot Help needed standing up from a chair using your arms (e.g., wheelchair or bedside chair)?: A Lot Help needed to walk in hospital room?: Total Help needed climbing 3-5 steps with a railing? : Total 6 Click Score: 10    End of Session Equipment Utilized During Treatment: Gait belt Activity Tolerance: Patient tolerated treatment well Patient left: in bed;with call bell/phone within reach;with bed alarm set;Other (comment)(bed in chair position) Nurse Communication: Mobility status PT Visit Diagnosis: Other symptoms and signs involving the nervous system (R29.898);Muscle weakness (generalized) (M62.81);Hemiplegia and hemiparesis Hemiplegia - Right/Left: Left Hemiplegia - caused by: Nontraumatic intracerebral hemorrhage     Time: 1202-1232 PT Time Calculation (min) (ACUTE ONLY): 30 min  Charges:  $Therapeutic Activity: 8-22 mins                     [name], PTA Acute Rehabilitation Services Pager: [phone] Office: [phone]     Darliss Cheney 08/30/2019, 5:18 PM

## 2019-08-30 NOTE — Progress Notes (Signed)
Pt trach capped per MD request. Pt stable throughout with no complications. VS within normal limits. Pt able to vocalize and has a strong no productive cough post capping. RN at bedside and aware.

## 2019-08-30 NOTE — Progress Notes (Signed)
Hypoglycemic Event  CBG: 64  Treatment: D5 50  Symptoms: none  Follow-up CBG: Time:0100 CBG Result 132  Possible Reasons for Event: unknown  Comments/MD notified:no    Manson Passey, Boykin Peek

## 2019-08-30 NOTE — Progress Notes (Addendum)
  Speech Language Pathology Treatment: Dysphagia  Patient Details Name: Haley Hernandez MRN: [id number] DOB: December 02, 1960 Today's Date: 08/30/2019 Time: 2993-7169 SLP Time Calculation (min) (ACUTE ONLY): 22 min  Assessment / Plan / Recommendation Clinical Impression  Pt's trach has been downsized to #4 cuffless since last seen by SLP.  PMV placed, and pt achieved immediate voice with excellent quality, though low volume, with evidence of at least a mild dysarthria of speech.  Pt with improved spontaneity of output, asking to have her feet massaged, making needs known. She required prompts to use her voice rather than gesturing or just mouthing words.  Ice chips provided - pt with excellent attention, decreased oral seal on left with anterior spillage of ice chips and secretions.  Required verbal/tactile cues to improve seal.  Swallow was initiated. Its effectiveness -her ability to transition boluses and protect airway - is unknown. Recommend proceeding with MBS today to establish a baseline and determine physiology of swallow prior to her D/C to [facility].  Called her dtr, [name], to inform her of plan.  MBS 10 am today. D/W RN.   HPI HPI: 59 y.o. female with past medical history of intracerebral hemorrhage 7 years ago, hypertension presents to the emergency department after family found her unresponsive with L hemipleiga and R gaze deviation. 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 Pt intubated and with EVD placed on 3/15. EVD removed 3/24. tracheostomy 3/27; EEG 3/29 moderate diffuse encephalopathy with no seizures (multiple events of RUE twitching occurred during EEG without EEG changes)      SLP Plan  MBS;Continue with current plan of care       Recommendations         Patient may use Passy-Muir Speech Valve: During all therapies with supervision; may wear valve with supervision of nursing staff. Remove valve prior to leaving pt  alone. PMSV Supervision: Full         Oral Care Recommendations: Oral care QID Follow up Recommendations: Skilled Nursing facility SLP Visit Diagnosis: Dysphagia, unspecified (R13.10) Plan: MBS;Continue with current plan of care       GO                Blenda Mounts Laurice 08/30/2019, 9:30 AM Marchelle Folks L. Samson Frederic, MA CCC/SLP Acute Rehabilitation Services Office number [phone] Pager [phone]

## 2019-08-30 NOTE — Progress Notes (Signed)
[facility] notified that patient trach is having blood mucous RT did come and stated that they have done all that they can do. Patient is asymptomatic O2  stats 97 %

## 2019-08-30 NOTE — Progress Notes (Signed)
   NAME:  Haley Hernandez, MRN:  [id number], DOB:  [birth year], LOS: 43 ADMISSION DATE:  07/18/2019, CONSULTATION DATE:  07/19/2019 REFERRING MD:  Dr. Laurence Slate, CHIEF COMPLAINT:  ICH  Brief History   59 yo female found with AMS and Lt hemiplegia, SBP 250 with resulting ICH of Rt thalamus and BG with IVH.  Intubated for airway protection.  Required tracheostomy due to altered mental status and tongue swelling.  Past Medical History  HTN, OSA, ICH  Consults:  NSGY Neurology ENT Palliative care  Procedures:  3/15 ETT >> 3/27 Trach 3/28 >> 3/16 PICC >>   Micro Data:  SARS CoV2 PCR 3/15 >> negative Sputum 3/17 >> negative Sputum 3/20 >> negative Blood 3/20 >> negative Blood, urine 3/26 >>negative Blood 4/03 >>   Interim history/subjective:  Doing well this AM.  Denies pain.  Objective   BP 132/88   Pulse (!) 101   Temp 98.7 F (37.1 C) (Axillary)   Resp 16   Ht 5' (1.524 m)   Wt 57.5 kg   SpO2 97%   BMI 24.76 kg/m   I/O last 3 completed shifts: In: -  Out: 1725 [Urine:1725]  Physical exam: GEN: middle aged woman on TC HEENT: trach in place, small whitish secretions CV: RRR, ext warm PULM: Occasional transmitted upper airway sounds GI: Soft, +BS EXT: No edema NEURO: moves R side briskly to command, L hemiplegia PSYCH: RASS 0 SKIN: no rashes   Assessment & Plan:   Acute hypoxic respiratory failure 2nd to compromised airway in setting of ICH and macroglossia and angioedema Hx of OSA. Tracheostomy status 3/28.   Discussion Did well with 4-0 cuffless, cap today and consider decannulation tomorrow Discussed with RT and SLP  Myrla Halsted MD PCCM

## 2019-08-30 NOTE — Progress Notes (Signed)
RT called to bedside by RN for reports that trach was out. RT spoke with MD and ok to just cover with gauze and leave trach out as she was set to be decannulated tomorrow anywats. Haley Hernandez site is clean and dry. Pt able to vocalize and has a strong non-productive cough. RN at bedside. VS within normal limits. RT will continue to monitor

## 2019-08-30 NOTE — Progress Notes (Signed)
Occupational Therapy Treatment Patient Details Name: Haley Hernandez MRN: [id number] DOB: 1960-10-23 Today's Date: 08/30/2019    History of present illness 59 y.o. female with past medical history of intracerebral hemorrhage 7 years ago, hypertension presents to the emergency department after family found her unresponsive with L hemipleiga and R gaze deviation. 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 Pt intubated and with EVD placed on 3/15. EVD removed 3/24. tracheostomy 3/27; EEG 3/29 moderate diffuse encephalopathy with no seizures (multiple events of RUE twitching occurred during EEG without EEG changes)   OT comments  Pt with trach capped this session on RA and needed cues throughout session to control secretions. Pt tolerated OOB to BSC to void bowels and return to supine. Pt positioned in chair in the bed due to poor trunk control to remain in recliner. Pt positioned with pillows for L UE edema management. Pt with music playing as enrichment. Pt verbalizing throughout session. Pt reports "im dizzy" during BSC sitting with stable VSS.    Follow Up Recommendations  SNF;Supervision/Assistance - 24 hour    Equipment Recommendations  3 in 1 bedside commode;Wheelchair (measurements OT);Wheelchair cushion (measurements OT);Hospital bed(hoyer)    Recommendations for Other Services      Precautions / Restrictions Precautions Precautions: Fall Precaution Comments: trach capped at noon on RA, peg tube,        Mobility Bed Mobility Overal bed mobility: Needs Assistance Bed Mobility: Rolling;Supine to Sit;Sit to Supine Rolling: Max assist   Supine to sit: Max assist Sit to supine: Max assist   General bed mobility comments: pt reaching with R UE for bed rail with roll and turning head L . pt requires (A) to elevate trunk off bed and bring bil LE off EOB. pt with return to supine descends on R arm and head toward pillow but  requires (A) for bil LE  Transfers Overall transfer level: Needs assistance   Transfers: Stand Pivot Transfers Sit to Stand: +2 physical assistance;Max assist         General transfer comment: transfer to bsc this session    Balance Overall balance assessment: Needs assistance Sitting-balance support: Single extremity supported;Feet supported Sitting balance-[name]: Poor Sitting balance - Comments: pt engages trunk but requires stedy (A)                                   ADL either performed or assessed with clinical judgement   ADL Overall ADL's : Needs assistance/impaired Eating/Feeding: NPO   Grooming: Moderate assistance Grooming Details (indicate cue type and reason): pt with secretions but perseverating on attempting wipe peri area.              Lower Body Dressing: Total assistance Lower Body Dressing Details (indicate cue type and reason): don socks Toilet Transfer: +2 for physical assistance;Maximal assistance;Stand-pivot Toilet Transfer Details (indicate cue type and reason): pt transferred to the Right side and stepping. pt requires increased (A) to transfer back to L side for return to bed Toileting- Clothing Manipulation and Hygiene: Total assistance Toileting - Clothing Manipulation Details (indicate cue type and reason): completed in supine peri care after void. pt becoming dizzy sitting on BSC. pt with stable vss noted       General ADL Comments: pt transfer to [facility] and then back to bed due to decr trunk stability     Vision   Additional Comments: pt with nystagmus  at rest. pt with dysconjugate gaze   Perception     Praxis      Cognition Arousal/Alertness: Awake/alert Behavior During Therapy: Flat affect Overall Cognitive Status: Impaired/Different from baseline                     Current Attention Level: Focused   Following Commands: Follows one step commands consistently       General Comments: pt asking  for music, pt able to state RN name Lauryn from memory. pt asking for daughter during session. Pt requesting to void bowels and completed on [facility] appropriately        Exercises     Shoulder Instructions       General Comments VSS     Pertinent Vitals/ Pain       Pain Assessment: No/denies pain Faces Pain Scale: No hurt  Home Living                                          Prior Functioning/Environment              Frequency  Min 2X/week        Progress Toward Goals  OT Goals(current goals can now be found in the care plan section)  Progress towards OT goals: Progressing toward goals  Acute Rehab OT Goals Patient Stated Goal: hear music 97.1 OT Goal Formulation: Patient unable to participate in goal setting Time For Goal Achievement: 09/13/19 Potential to Achieve Goals: Fair ADL Goals Pt Will Perform Grooming: with min assist;bed level Additional ADL Goal #1: Pt will perform bed mobility with Mod A +2 in preparation for ADLs Additional ADL Goal #2: Pt will tolerate sitting at EOB with Min A in preparation for ADLs Additional ADL Goal #3: Pt will complete 2 step command during adl  Plan Discharge plan remains appropriate    Co-evaluation    PT/OT/SLP Co-Evaluation/Treatment: Yes Reason for Co-Treatment: Complexity of the patient's impairments (multi-system involvement);Necessary to address cognition/behavior during functional activity;To address functional/ADL transfers;For patient/therapist safety   OT goals addressed during session: ADL's and self-care;Strengthening/ROM;Proper use of Adaptive equipment and DME      AM-PAC OT "6 Clicks" Daily Activity     Outcome Measure   Help from another person eating meals?: A Lot Help from another person taking care of personal grooming?: A Lot Help from another person toileting, which includes using toliet, bedpan, or urinal?: A Lot Help from another person bathing (including washing, rinsing,  drying)?: A Lot Help from another person to put on and taking off regular upper body clothing?: A Lot Help from another person to put on and taking off regular lower body clothing?: Total 6 Click Score: 11    End of Session    OT Visit Diagnosis: Unsteadiness on feet (R26.81);Other abnormalities of gait and mobility (R26.89);Muscle weakness (generalized) (M62.81);Low vision, both eyes (H54.2);Hemiplegia and hemiparesis Hemiplegia - Right/Left: Left Hemiplegia - caused by: Cerebral infarction   Activity Tolerance Patient tolerated treatment well   Patient Left in bed;with call bell/phone within reach;with bed alarm set(in chair position)   Nurse Communication Mobility status;Precautions        Time: 3154-0086 OT Time Calculation (min): 29 min  Charges: OT General Charges $OT Visit: 1 Visit OT Treatments $Self Care/Home Management : 8-22 mins   Brynn, OTR/L  Acute Rehabilitation Services Pager: [phone] Office: [phone] .  Jeri Modena 08/30/2019, 2:10 PM

## 2019-08-30 NOTE — Progress Notes (Signed)
Inpatient Diabetes Program Recommendations  AACE/ADA: New Consensus Statement on Inpatient Glycemic Control (2015)  Target Ranges:  Prepandial:   less than 140 mg/dL      Peak postprandial:   less than 180 mg/dL (1-2 hours)      Critically ill patients:  140 - 180 mg/dL   Lab Results  Component Value Date   GLUCAP 115 (H) 08/30/2019   HGBA1C 5.7 (H) 07/19/2019    Review of Glycemic Control Results for Haley Hernandez, Haley Hernandez (MRN [id number]) as of 08/30/2019 11:42  Ref. Range 08/29/2019 20:59 08/30/2019 00:09 08/30/2019 01:08 08/30/2019 04:18 08/30/2019 07:32  Glucose-Capillary Latest Ref Range: 70 - 99 mg/dL 069 (H) 64 (L) 861 (H) 111 (H) 115 (H)   Inpatient Diabetes Program Recommendations:  -Decrease Novolog correction to sensitive -Decrease Novolog tube feed coverage to 5 units  Thank you, Darel Hong E. Lynnex Fulp, RN, MSN, CDE  Diabetes Coordinator Inpatient Glycemic Control Team Team Pager [phone] (8am-5pm) 08/30/2019 11:45 AM

## 2019-08-31 DIAGNOSIS — Z930 Tracheostomy status: Secondary | ICD-10-CM

## 2019-08-31 LAB — GLUCOSE, CAPILLARY
Glucose-Capillary: 125 mg/dL — ABNORMAL HIGH (ref 70–99)
Glucose-Capillary: 103 mg/dL — ABNORMAL HIGH (ref 70–99)
Glucose-Capillary: 123 mg/dL — ABNORMAL HIGH (ref 70–99)
Glucose-Capillary: 155 mg/dL — ABNORMAL HIGH (ref 70–99)

## 2019-08-31 MED ORDER — POLYETHYLENE GLYCOL 3350 17 G PO PACK
17.0000 g | PACK | Freq: Every day | ORAL | Status: DC
  Administered 2019-08-31: 17 g via ORAL
  Filled 2019-08-31: qty 1

## 2019-08-31 MED ORDER — FREE WATER: 110.0000 mL | Status: DC

## 2019-08-31 MED ORDER — INSULIN ASPART 100 UNIT/ML ~~LOC~~ SOLN: 3.0000 [IU] | SUBCUTANEOUS | 11 refills | Status: DC

## 2019-08-31 MED ORDER — LABETALOL HCL 200 MG PO TABS: 200.0000 mg | ORAL_TABLET | Freq: Three times a day (TID) | ORAL | Status: DC

## 2019-08-31 MED ORDER — HYDRALAZINE HCL 100 MG PO TABS: 100.0000 mg | ORAL_TABLET | Freq: Three times a day (TID) | ORAL | Status: DC

## 2019-08-31 MED ORDER — PREDNISONE 5 MG/ML PO CONC: 10.0000 mg | Freq: Every day | ORAL | 0 refills | Status: AC

## 2019-08-31 MED ORDER — JEVITY 1.2 CAL PO LIQD: 1000.0000 mL | ORAL | 0 refills | Status: DC

## 2019-08-31 MED ORDER — ALBUTEROL SULFATE (2.5 MG/3ML) 0.083% IN NEBU: 2.5000 mg | INHALATION_SOLUTION | RESPIRATORY_TRACT | 12 refills | Status: DC | PRN

## 2019-08-31 MED ORDER — ACETAMINOPHEN 160 MG/5ML PO SOLN: 650.0000 mg | ORAL | 0 refills | Status: DC | PRN

## 2019-08-31 MED ORDER — INSULIN ASPART 100 UNIT/ML ~~LOC~~ SOLN: 0.0000 [IU] | Freq: Four times a day (QID) | SUBCUTANEOUS | 11 refills | Status: DC

## 2019-08-31 MED ORDER — INSULIN GLARGINE 100 UNIT/ML ~~LOC~~ SOLN: 24.0000 [IU] | Freq: Every day | SUBCUTANEOUS | 11 refills | Status: DC

## 2019-08-31 MED ORDER — FAMOTIDINE 20 MG PO TABS: 20.0000 mg | ORAL_TABLET | Freq: Two times a day (BID) | ORAL | Status: DC

## 2019-08-31 MED ORDER — POLYETHYLENE GLYCOL 3350 17 G PO PACK: 17.0000 g | PACK | Freq: Every day | ORAL | 0 refills | Status: DC

## 2019-08-31 NOTE — Progress Notes (Signed)
Patient being discharged to long term acute care facility. Being transferred via [facility]. Report given to Huntley Dec, RN at facility. All belongings with patient. IV still in place.

## 2019-08-31 NOTE — TOC Transition Note (Signed)
Transition of Care [facility]) - CM/SW Discharge Note   Patient Details  Name: Haley Hernandez MRN: [id number] Date of Birth: 11-Jul-1960  [facility] CM/SW Contact:  Kermit Balo, RN Phone Number: 08/31/2019, 12:08 PM   Clinical Narrative:    Pt discharging to [facility] today. CM has spoken with [facility] and they have a bed today. Pt to transport via [facility] and timing arranged for 3:15 pm pickup. Family at the bedside and aware. D/c packet at the desk and bedside RN updated.   Room 408 Number for report: [phone] or [phone]   Final next level of care: Long Term Acute Care (LTAC) Barriers to Discharge: No Barriers Identified   Patient Goals and CMS Choice Patient states their goals for this hospitalization and ongoing recovery are:: patient unable to state goals due to disorientation CMS Medicare.gov Compare Post Acute Care list provided to:: Patient Represenative (must comment) Choice offered to / list presented to : Adult Children  Discharge Placement                Patient to be transferred to facility by: [facility] Name of family member notified: Jon Gills and [name] Patient and family notified of of transfer: 08/31/19  Discharge Plan and Services     Post Acute Care Choice: Long Term Acute Care (LTAC), Skilled Nursing Facility                               Social Determinants of Health (SDOH) Interventions     Readmission Risk Interventions No flowsheet data found.

## 2019-08-31 NOTE — Progress Notes (Signed)
   NAME:  Haley Hernandez, MRN:  [id number], DOB:  Apr 02, 1961, LOS: 44 ADMISSION DATE:  07/18/2019, CONSULTATION DATE:  07/19/2019 REFERRING MD:  Dr. Laurence Slate, CHIEF COMPLAINT:  ICH  Brief History   59 yo female found with AMS and Lt hemiplegia, SBP 250 with resulting ICH of Rt thalamus and BG with IVH.  Intubated for airway protection.  Required tracheostomy due to altered mental status and tongue swelling.  Past Medical History  HTN, OSA, ICH  Consults:  NSGY Neurology ENT Palliative care  Procedures:  3/15 ETT >> 3/27 Trach 3/28 >> 3/16 PICC >>   Micro Data:  SARS CoV2 PCR 3/15 >> negative Sputum 3/17 >> negative Sputum 3/20 >> negative Blood 3/20 >> negative Blood, urine 3/26 >>negative Blood 4/03 >>   Interim history/subjective:  Trach out 4/27 afternoon-- covered with gauze. Pt with strong cough, able to vocalize   Objective   BP 119/60 (BP Location: Right Arm)   Pulse 92   Temp 98.5 F (36.9 C) (Axillary)   Resp 19   Ht 5' (1.524 m)   Wt 72.8 kg   SpO2 95%   BMI 31.34 kg/m   I/O last 3 completed shifts: In: -  Out: 1150 [Urine:1150]  Physical exam:  GEN: Chronically ill obese F, supine in bed receiving bath NAD  HEENT: NCAT. Stoma clean, without bleeding or drainage. Anicteric sclera  CV: RRR 2+ radial pulses. Cap refill < 3 seconds  PULM: Symmetrical chest expansion, no accessory muscle use on RA.   GI: + G tube. Soft mildly distended.  EXT: No edema, no cyanosis or clubbing  NEURO: Awake alert. Moving R side  PSYCH: RASS 0, Interactive  SKIN: c/d/w without rash    Assessment & Plan:   Acute hypoxic respiratory failure 2nd to compromised airway in setting of ICH and macroglossia and angioedema Hx of OSA. Tracheostomy status 3/28, trach removed 4/27 P -tolerating decannulation well.  -Cover stoma with bandaid. Change Daily.  - If stoma does not close in 2 weeks, would need ENT     PCCM will sign off. Please engage if we can be of further  assistance.     Tessie Fass MSN, AGACNP-BC [facility] [phone] If no answer, [phone] 08/31/2019, 9:43 AM

## 2019-08-31 NOTE — Progress Notes (Signed)
  Speech Language Pathology Treatment: Dysphagia  Patient Details Name: Haley Hernandez MRN: [id number] DOB: [birth year] Today's Date: 08/31/2019 Time: 2841-3244 SLP Time Calculation (min) (ACUTE ONLY): 20 min  Assessment / Plan / Recommendation Clinical Impression  Pt self-decannulated last night; tolerating well. Voice is clear/strong, communication more spontaneous and pt is making needs known.  Daughters present.  We discussed results of MBS yesterday.  Recommend pt begin to consume ice chips with supervision from daughters or staff.    Today, pt demonstrated ability to seal lips when verbally cued.  She followed-through with an effortful swallow when verbally/tactily prompted on 10/10 opportunities.  Instructed in use of Masako maneuver - pt required verbal/visual cues on 80% of trials.  Recommended to pt/dtrs that she begin doing this exercise a minimum of 10x am/afternoon/pm.  They agree.  SLP will follow.  Haley Hernandez is making promising gains this week.   HPI HPI: 59 y.o. female with past medical history of intracerebral hemorrhage 7 years ago, hypertension presents to the emergency department after family found her unresponsive with L hemipleiga and R gaze deviation. 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 Pt intubated and with EVD placed on 3/15. EVD removed 3/24. tracheostomy 3/27; EEG 3/29 moderate diffuse encephalopathy with no seizures (multiple events of RUE twitching occurred during EEG without EEG changes)      SLP Plan  Continue with current plan of care       Recommendations  Diet recommendations: NPO(may have ice chips) Medication Administration: Via alternative means                Oral Care Recommendations: Oral care QID Follow up Recommendations: Skilled Nursing facility SLP Visit Diagnosis: Dysphagia, oral phase (R13.11);Dysphagia, pharyngoesophageal phase (R13.14) Plan: Continue with current plan of  care       GO                Carolan Shiver 08/31/2019, 9:53 AM  Marchelle Folks L. Samson Frederic, MA CCC/SLP Acute Rehabilitation Services Office number [phone] Pager [phone]

## 2019-08-31 NOTE — Progress Notes (Signed)
Physical Therapy Treatment Patient Details Name: Haley Hernandez MRN: [id number] DOB: Aug 30, 1960 Today's Date: 08/31/2019    History of Present Illness 59 y.o. female with past medical history of intracerebral hemorrhage 7 years ago, hypertension presents to the emergency department after family found her unresponsive with L hemipleiga and R gaze deviation. 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 Pt intubated and with EVD placed on 3/15. EVD removed 3/24. tracheostomy 3/27; EEG 3/29 moderate diffuse encephalopathy with no seizures (multiple events of RUE twitching occurred during EEG without EEG changes)    PT Comments    Patient continues to make steady progress toward PT goals and tolerated OOB transfer to recliner well. Pt requires +2 for functional transfer training given L side hemiparesis and Stedy standing frame utilized for safety. PT will continue to follow acutely and progress as tolerated.     Follow Up Recommendations  SNF;LTACH;Supervision/Assistance - 24 hour     Equipment Recommendations  Other (comment)(defer to post acute rehab)    Recommendations for Other Services       Precautions / Restrictions Precautions Precautions: Fall Precaution Comments: peg tube Restrictions Weight Bearing Restrictions: No    Mobility  Bed Mobility Overal bed mobility: Needs Assistance Bed Mobility: Supine to Sit     Supine to sit: Max assist     General bed mobility comments: multimodal cues for sequencing; assist to bring L LE and hips to EOB and then to elevate trunk into sitting; pt assisting with R UE/LE throughout   Transfers Overall transfer level: Needs assistance   Transfers: Sit to/from Stand Sit to Stand: +2 physical assistance;Max assist;Mod assist         General transfer comment: mod A +2 to power up into standing and max A to maintain upright and midline sitting balance on standong frame; pt leaning  heavily to L side and multimodal cues required for weight shifting and keeping R hand grip on Stedy  Ambulation/Gait                 Stairs             Wheelchair Mobility    Modified Rankin (Stroke Patients Only) Modified Rankin (Stroke Patients Only) Pre-Morbid Rankin Score: (unable to determine at this time) Modified Rankin: Severe disability     Balance Overall balance assessment: Needs assistance Sitting-balance support: Single extremity supported;Feet supported Sitting balance-[name]: Poor Sitting balance - Comments: pt engages trunk but requires steady (A) Postural control: Posterior lean;Left lateral lean   Standing balance-[name]: Zero                              Cognition Arousal/Alertness: Awake/alert Behavior During Therapy: Flat affect;Restless Overall Cognitive Status: Impaired/Different from baseline Area of Impairment: Attention;Following commands;Safety/judgement                   Current Attention Level: Focused   Following Commands: Follows one step commands consistently Safety/Judgement: Decreased awareness of safety;Decreased awareness of deficits     General Comments: pt is participatory but distracted easily this session during mobility tasks      Exercises      General Comments        Pertinent Vitals/Pain Pain Assessment: Faces Faces Pain Scale: Hurts little more Pain Location: stomach and back Pain Descriptors / Indicators: Grimacing;Sore Pain Intervention(s): Limited activity within patient's tolerance;Monitored during session;Repositioned    Home Living  Prior Function            PT Goals (current goals can now be found in the care plan section) Progress towards PT goals: Progressing toward goals    Frequency    Min 3X/week      PT Plan Current plan remains appropriate    Co-evaluation              AM-PAC PT "6 Clicks" Mobility    Outcome Measure  Help needed turning from your back to your side while in a flat bed without using bedrails?: A Lot Help needed moving from lying on your back to sitting on the side of a flat bed without using bedrails?: A Lot Help needed moving to and from a bed to a chair (including a wheelchair)?: A Lot Help needed standing up from a chair using your arms (e.g., wheelchair or bedside chair)?: A Lot Help needed to walk in hospital room?: Total Help needed climbing 3-5 steps with a railing? : Total 6 Click Score: 10    End of Session Equipment Utilized During Treatment: Gait belt Activity Tolerance: Patient tolerated treatment well Patient left: with call bell/phone within reach;in chair;with chair alarm set Nurse Communication: Mobility status;Need for lift equipment PT Visit Diagnosis: Other symptoms and signs involving the nervous system (R29.898);Muscle weakness (generalized) (M62.81);Hemiplegia and hemiparesis Hemiplegia - Right/Left: Left Hemiplegia - caused by: Nontraumatic intracerebral hemorrhage     Time: 0865-7846 PT Time Calculation (min) (ACUTE ONLY): 26 min  Charges:  $Therapeutic Activity: 23-37 mins                     [name], PTA Acute Rehabilitation Services Pager: [phone] Office: [phone]     [name] 08/31/2019, 1:18 PM

## 2019-08-31 NOTE — Discharge Summary (Signed)
Physician Discharge Summary  Haley LohDoreen Hernandez ZOX:[id number] DOB: Nov 14, 1960 DOA: 07/18/2019  PCP: Patient, No Pcp Per  Admit date: 07/18/2019 Discharge date: 08/31/2019  Admitted From: Home Disposition: [facility]  Recommendations for Outpatient Follow-up:  Patient will need neurology follow-up.   Discharge Condition: Stable CODE STATUS: Full code Diet recommendation: N.p.o., ice chips alert, tube feeding.  Discharge im Summary: [facility] after [facility] days in the hospital.  Her hospital course and further management plan is described as below.  Brief hospital course: Past medical history of HTN, OSA, remote history of ICH.  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  Patient was intubated in the ED.  Neurosurgery was consulted and EVD was placed.  PCCM was consulted for vent management.  IR was consulted for G-tube placement.  ENT was consulted for tracheostomy tube placement. 3/15 admitted, intubated, ventriculostomy placed 3/23 ventriculostomy removed. 3/28 tracheostomy done 3/31 IR guided PEG tube placement 4/5 ENT consulted for macroglossia started on steroids  Assessment and Plan: Hemorrhagic stroke  Hypertensive urgency. Treated with external ventricular drainage.  Has hx of ICH 7 yrs ago.  Seen by neurology  continue PT OT supportive care.  Avoid anticoagulation.  Acute respiratory failure/history of OSA: SP tracheostomy patient was intubated to protect airway for ICH, now has tracheostomy in place since March 27 and PEG in place  continue supportive care, secretion management. Doing well with trach collar,  changed to cuffless size 6 tracheostomy. In the process of decannulation. with Passy-Muir valve and trying to speak. PCCM following   Angioedema involving tongue with swelling, seen by ENT,  was placed on IV steroids with  significant improvement.  Currently on oral prednisone, will gradually taper off. Off Benadryl. Amlodipine/losartan has been discontinued as a potential etiology.  Dysphagia. On Tube feed  insulin Lantus 25 units and sliding scale q4h. Sugar stable, decreasing dose of insulin now. last hba1c 5.7 on 07/19/19. Adjust insulin as steroid is being weaned off She may need less and less insulin coverage.  Hypertension blood pressure well controlled on hydralazine and labetalol. Hold chlorthalidone due to hypercalcemia. Amlodipine and losartan has been discontinued due to angioedema. Continue to monitor closely.  Mild hypercalcemia. Discontinue chlorthalidone.    Stable  Acute metabolic encephalopathy: improved. She has been much more alert awake. Continue on supportive care.  Last CT head on 4/5showed continued improvement in the right thalamic hemorrhage, a small amount of ventricular hemorrhage with improvement, mild ventricular enlargement unchanged mild midline shift to the left slightly improved Mentation improved to baseline.  Pneumonia/bronchitis, was treated for pneumonia and completed antibiotic courses multiple rounds.   Anemia from acute illness monitor hemoglobin overall stable.  Patient is medically stable to transfer to [facility] level of care today.  Discharge Diagnoses:  Active Problems:   IVH (intraventricular hemorrhage) (HCC)   Acute respiratory failure with hypoxemia (HCC)   Hypokalemia   Hypophosphatemia   Fever   Palliative care by specialist   Goals of care, counseling/discussion   Full code status   Tongue swelling    Discharge Instructions  Discharge Instructions    Ambulatory referral to Neurology   Complete by: As directed    Follow up with stroke clinic NP (Jessica Vanschaick or Darrol Angelarolyn Martin, if both not available, consider Manson AllanSethi, Penumali, or Ahern) [facility]. Thanks.   Increase activity slowly   Complete by: As  directed  Allergies as of 08/31/2019      Reactions   Losartan Swelling   Norvasc [amlodipine] Swelling   Penicillins    Unable to verify reactions at this time/ Tolerated ceftriaxone March 2021      Medication List    STOP taking these medications   carvedilol 12.5 MG tablet Commonly known as: COREG   chlorthalidone 25 MG tablet Commonly known as: HYGROTON   FeroSul 325 (65 FE) MG tablet Generic drug: ferrous sulfate   losartan 50 MG tablet Commonly known as: COZAAR   ProAir HFA 108 (90 Base) MCG/ACT inhaler Generic drug: albuterol Replaced by: albuterol (2.5 MG/3ML) 0.083% nebulizer solution     TAKE these medications   acetaminophen 160 MG/5ML solution Commonly known as: TYLENOL Place 20.3 mLs (650 mg total) into feeding tube every 4 (four) hours as needed for mild pain (or temp > 37.5 C (99.5 F)).   albuterol (2.5 MG/3ML) 0.083% nebulizer solution Commonly known as: PROVENTIL Take 3 mLs (2.5 mg total) by nebulization every 4 (four) hours as needed for wheezing. Replaces: ProAir HFA 108 (90 Base) MCG/ACT inhaler   famotidine 20 MG tablet Commonly known as: PEPCID Place 1 tablet (20 mg total) into feeding tube 2 (two) times daily.   feeding supplement (JEVITY 1.2 CAL) Liqd Place 1,000 mLs into feeding tube continuous.   free water Soln Place 110 mLs into feeding tube every 4 (four) hours.   hydrALAZINE 100 MG tablet Commonly known as: APRESOLINE Place 1 tablet (100 mg total) into feeding tube every 8 (eight) hours.   insulin aspart 100 UNIT/ML injection Commonly known as: novoLOG Inject 3 Units into the skin every 4 (four) hours.   insulin aspart 100 UNIT/ML injection Commonly known as: novoLOG Inject 0-9 Units into the skin every 6 (six) hours.   insulin glargine 100 UNIT/ML injection Commonly known as: LANTUS Inject 0.24 mLs (24 Units total) into the skin daily. Start taking on: September 01, 2019   labetalol 200 MG tablet Commonly known as:  NORMODYNE Place 1 tablet (200 mg total) into feeding tube 3 (three) times daily.   polyethylene glycol 17 g packet Commonly known as: MIRALAX / GLYCOLAX Take 17 g by mouth daily.   predniSONE 5 MG/ML concentrated solution Place 2 mLs (10 mg total) into feeding tube daily for 3 days. Start taking on: September 01, 2019            Durable Medical Equipment  (From admission, onward)         Start     Ordered   08/24/19 1458  For home use only DME Hospital bed  Once    Question Answer Comment  Length of Need Lifetime   The above medical condition requires: Patient requires the ability to reposition immediately   Head must be elevated greater than: 30 degrees   Bed type Semi-electric   Hoyer Lift Yes   Support Surface: Gel Overlay      08/24/19 1458         Follow-up Information    [facility]. Schedule an appointment as soon as possible for a visit in 4 week(s).   Specialty: Neurology Contact information: [address] [place] [address] [phone]       [facility]. Schedule an appointment as soon as possible for a visit.   Specialty: Neurosurgery Why: 4-6 weeks from discharge.  Contact information: [address] [place] [address] [phone]  Allergies  Allergen Reactions  . Losartan Swelling  . Norvasc [Amlodipine] Swelling  . Penicillins     Unable to verify reactions at this time/ Tolerated ceftriaxone March 2021    Consultations:  Neurology  Neurosurgery  Interventional etiology  PCCM  Palliative medicine   Procedures/Studies: CT HEAD WO CONTRAST  Result Date: 08/08/2019 CLINICAL DATA:  Intracranial hemorrhage EXAM: CT HEAD WITHOUT CONTRAST TECHNIQUE: Contiguous axial images were obtained from the base of the skull through the vertex without intravenous contrast. COMPARISON:  CT head 07/31/2019 FINDINGS: Brain: Continued improvement  right thalamic hemorrhage. Decrease high-density hemorrhage centrally and decreased surrounding edema. Mild midline shift to the left has improved. Mild ventricular enlargement is stable. There is layering blood in the occipital horns, slightly improved. Interval improvement in edema left frontal lobe at site of prior ventriculostomy. Small bone fragments in the left frontal lobe unchanged. Vascular: Negative for hyperdense vessel Skull: Left frontal burr hole.  No acute skeletal abnormality. Sinuses/Orbits: Mucosal edema in the paranasal sinuses with air-fluid levels in the sphenoid sinus bilaterally. Mastoid clear bilaterally. Other: None IMPRESSION: Continued improvement in right thalamic hemorrhage. Small amount of ventricular hemorrhage with improvement. Mild ventricular enlargement unchanged. Mild midline shift to the left slightly improved. Electronically Signed   By: Marlan Palau M.D.   On: 08/08/2019 08:03   IR GASTROSTOMY TUBE MOD SED  Result Date: 08/03/2019 INDICATION: Dysphagia, intracranial hemorrhage, acute respiratory failure EXAM: FLUOROSCOPIC 24 FRENCH PULL-THROUGH GASTROSTOMY Date:  08/03/2019 08/03/2019 3:21 pm Radiologist:  Judie Petit. Ruel Favors, MD Guidance:  Fluoroscopic MEDICATIONS: Ancef 2 g within 1 hour of the procedure; Antibiotics were administered within 1 hour of the procedure. Glucagon 0.5 mg IV ANESTHESIA/SEDATION: Versed 1.0 mg IV; Fentanyl 50 mcg IV Moderate Sedation Time:  12 minutes The patient was continuously monitored during the procedure by the interventional radiology nurse under my direct supervision. CONTRAST:  11mL OMNIPAQUE IOHEXOL 300 MG/ML SOLN - administered into the gastric lumen. FLUOROSCOPY TIME:  Fluoroscopy Time: 2 minutes 42 seconds (10 mGy). COMPLICATIONS: None immediate. PROCEDURE: Informed consent was obtained from the patient's family following explanation of the procedure, risks, benefits and alternatives. The patient understands, agrees and consents for the  procedure. All questions were addressed. A time out was performed. Maximal barrier sterile technique utilized including caps, mask, sterile gowns, sterile gloves, large sterile drape, hand hygiene, and betadine prep. The left upper quadrant was sterilely prepped and draped. An oral gastric catheter was inserted into the stomach under fluoroscopy. The existing nasogastric feeding tube was removed. Air was injected into the stomach for insufflation and visualization under fluoroscopy. The air distended stomach was confirmed beneath the anterior abdominal wall in the frontal and lateral projections. Under sterile conditions and local anesthesia, a 17 gauge trocar needle was utilized to access the stomach percutaneously beneath the left subcostal margin. Needle position was confirmed within the stomach under biplane fluoroscopy. Contrast injection confirmed position also. A single T tack was deployed for gastropexy. Over an Amplatz guide wire, a 9-French sheath was inserted into the stomach. A snare device was utilized to capture the oral gastric catheter. The snare device was pulled retrograde from the stomach up the esophagus and out the oropharynx. The 24 pull-through gastrostomy was connected to the snare device and pulled antegrade through the oropharynx down the esophagus into the stomach and then through the percutaneous tract external to the patient. The gastrostomy was assembled externally. Contrast injection confirms position in the stomach. Images were obtained for documentation. The patient tolerated procedure  well. No immediate complication. IMPRESSION: Fluoroscopic insertion of a 24 "pull-through" gastrostomy. Electronically Signed   By: Jerilynn Mages.  Shick M.D.   On: 08/03/2019 15:33   DG CHEST PORT 1 VIEW  Result Date: 08/06/2019 CLINICAL DATA:  Fevers EXAM: PORTABLE CHEST 1 VIEW COMPARISON:  07/30/2019 FINDINGS: Cardiac shadow is enlarged but stable. Tracheostomy tube and right-sided PICC line are noted in  satisfactory position. Feeding catheter has been removed in the interval. Minimal left basilar atelectasis is noted but improved from the prior study. No other focal abnormality is noted. IMPRESSION: Left basilar atelectasis improved from the prior study. Tubes and lines as described. Electronically Signed   By: Inez Catalina M.D.   On: 08/06/2019 19:44   DG Swallowing Func-Speech Pathology  Result Date: 08/30/2019 Objective Swallowing Evaluation: Type of Study: MBS-Modified Barium Swallow Study  Patient Details Name: Madalaine Portier MRN: [id number] Date of Birth: 12/22/1960 Today's Date: 08/30/2019 Time: SLP Start Time (ACUTE ONLY): 1100 -SLP Stop Time (ACUTE ONLY): 1125 SLP Time Calculation (min) (ACUTE ONLY): 25 min Past Medical History: No past medical history on file. Past Surgical History: Past Surgical History: Procedure Laterality Date . IR GASTROSTOMY TUBE MOD SED  08/03/2019 HPI: 59 y.o. female with past medical history of intracerebral hemorrhage 7 years ago, hypertension presents to the emergency department after family found her unresponsive with L hemipleiga and R gaze deviation. 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 Pt intubated and with EVD placed on 3/15. EVD removed 3/24. tracheostomy 3/27; EEG 3/29 moderate diffuse encephalopathy with no seizures (multiple events of RUE twitching occurred during EEG without EEG changes)  Subjective: Pt was alert and cooperative Assessment / Plan / Recommendation CHL IP CLINICAL IMPRESSIONS 08/30/2019 Clinical Impression Pt presents with a primary oral, secondary cervical esophageal dysphagia.  Pharyngeal function, including bolus propulsion and laryngeal vestibule closure (LVC), were remarkably functional.  Oral control, labial seal, and containment of bolus were the most problematic with significant spillage from left oral cavity and reduced awareness.  Pt did not want to eat the pureed barium, stating  "it's nasty," but was ultimately willing to eat two spoonsful.  Once all materials reached valleculae/pyriforms, a swallow was triggered, there was effective LVC, and material traversed easily through UES.  Notable was inconsistent backflow of all consistencies from the cervical esophagus into the pyriforms - on one occasion, there was trace spillage of this backflow into the posterior larynx, triggering a cough response.  Pt is ready to begin PO trials initially with SLP, then should be able to progress to eating small amounts with trained family/staff. Concerns are less for aspiration during the swallow than backflow of contents of esophagus.  Therapeutic intervention should focus primarily on oral sensorimotor function.   SLP Visit Diagnosis Dysphagia, oral phase (R13.11);Dysphagia, pharyngoesophageal phase (R13.14) Attention and concentration deficit following -- Frontal lobe and executive function deficit following -- Impact on safety and function Mild aspiration risk   CHL IP TREATMENT RECOMMENDATION 08/30/2019 Treatment Recommendations Therapy as outlined in treatment plan below   Prognosis 08/30/2019 Prognosis for Safe Diet Advancement Good Barriers to Reach Goals -- Barriers/Prognosis Comment -- CHL IP DIET RECOMMENDATION 08/30/2019 SLP Diet Recommendations NPO Liquid Administration via -- Medication Administration -- Compensations -- Postural Changes --   CHL IP OTHER RECOMMENDATIONS 08/30/2019 Recommended Consults -- Oral Care Recommendations Oral care QID Other Recommendations Have oral suction available   CHL IP FOLLOW UP RECOMMENDATIONS 08/30/2019 Follow up Recommendations Skilled Nursing facility   [facility] IP  FREQUENCY AND DURATION 08/30/2019 Speech Therapy Frequency (ACUTE ONLY) min 2x/week Treatment Duration 2 weeks      CHL IP ORAL PHASE 08/30/2019 Oral Phase Impaired Oral - Pudding Teaspoon -- Oral - Pudding Cup -- Oral - Honey Teaspoon -- Oral - Honey Cup -- Oral - Nectar Teaspoon -- Oral - Nectar Cup Left  anterior bolus loss;Reduced posterior propulsion;Holding of bolus;Left pocketing in lateral sulci;Pocketing in anterior sulcus;Lingual/palatal residue;Piecemeal swallowing;Delayed oral transit;Decreased bolus cohesion;Weak lingual manipulation Oral - Nectar Straw -- Oral - Thin Teaspoon Left anterior bolus loss;Reduced posterior propulsion;Holding of bolus;Left pocketing in lateral sulci;Pocketing in anterior sulcus;Lingual/palatal residue;Piecemeal swallowing;Delayed oral transit;Decreased bolus cohesion;Weak lingual manipulation Oral - Thin Cup -- Oral - Thin Straw -- Oral - Puree Left anterior bolus loss;Reduced posterior propulsion;Holding of bolus;Left pocketing in lateral sulci;Pocketing in anterior sulcus;Lingual/palatal residue;Piecemeal swallowing;Delayed oral transit;Decreased bolus cohesion;Weak lingual manipulation Oral - Mech Soft -- Oral - Regular -- Oral - Multi-Consistency -- Oral - Pill -- Oral Phase - Comment --  CHL IP PHARYNGEAL PHASE 08/30/2019 Pharyngeal Phase Impaired Pharyngeal- Pudding Teaspoon -- Pharyngeal -- Pharyngeal- Pudding Cup -- Pharyngeal -- Pharyngeal- Honey Teaspoon -- Pharyngeal -- Pharyngeal- Honey Cup -- Pharyngeal -- Pharyngeal- Nectar Teaspoon -- Pharyngeal -- Pharyngeal- Nectar Cup Delayed swallow initiation-pyriform sinuses;Delayed swallow initiation-vallecula Pharyngeal -- Pharyngeal- Nectar Straw -- Pharyngeal -- Pharyngeal- Thin Teaspoon -- Pharyngeal -- Pharyngeal- Thin Cup Delayed swallow initiation-vallecula;Delayed swallow initiation-pyriform sinuses Pharyngeal -- Pharyngeal- Thin Straw -- Pharyngeal -- Pharyngeal- Puree Delayed swallow initiation-vallecula;Delayed swallow initiation-pyriform sinuses Pharyngeal -- Pharyngeal- Mechanical Soft -- Pharyngeal -- Pharyngeal- Regular -- Pharyngeal -- Pharyngeal- Multi-consistency -- Pharyngeal -- Pharyngeal- Pill -- Pharyngeal -- Pharyngeal Comment --  CHL IP CERVICAL ESOPHAGEAL PHASE 08/30/2019 Cervical Esophageal Phase  Impaired Pudding Teaspoon -- Pudding Cup -- Honey Teaspoon -- Honey Cup -- Nectar Teaspoon -- Nectar Cup Esophageal backflow into the pharynx Nectar Straw -- Thin Teaspoon -- Thin Cup Esophageal backflow into the pharynx Thin Straw -- Puree Esophageal backflow into the pharynx Mechanical Soft -- Regular -- Multi-consistency -- Pill -- Cervical Esophageal Comment -- Blenda Mounts Laurice 08/30/2019, 2:19 PM              EEG adult  Result Date: 08/08/2019 Charlsie Quest, MD     08/08/2019 10:54 AM Patient Name: Haley Hernandez MRN: [id number] Epilepsy Attending: Charlsie Quest Referring Physician/Provider: Dr. Marvel Plan Date: 08/08/2019 Duration: 25.20 minutes Patient history: 59 year old female with right thalamic and basal ganglia ICH, continues to be altered.  EEG to evaluate for seizures. Level of alertness: Awake AEDs during EEG study: None Technical aspects: This EEG study was done with scalp electrodes positioned according to the 10-20 International system of electrode placement. Electrical activity was acquired at a sampling rate of  and reviewed with a high frequency filter of  and a low frequency filter of . EEG data were recorded continuously and digitally stored. Description: During awake state, no clear posterior dominant rhythm was seen.  EEG showed continuous generalized and maximal right frontotemporal region 3 to 7 Hz theta-delta slowing. Hyperventilation and photic stimulation were not performed. Abnormality - Continuous slow, generalized and maximal right frontotemporal region IMPRESSION: This study is suggestive of cortical dysfunction in right frontotemporal region likely secondary to underlying hemorrhage as well as moderate diffuse encephalopathy, nonspecific to etiology. No seizures or epileptiform discharges were seen throughout the recording. [name] Annabelle Harman      Subjective: Patient seen and examined.  2 daughters at the bedside.  Working with physical therapy.  Able  to  talk and vocalize.   Discharge Exam: Vitals:   08/31/19 0444 08/31/19 0747  BP: 105/73 119/60  Pulse: 92 92  Resp: 14 19  Temp: 99.1 F (37.3 C) 98.5 F (36.9 C)  SpO2: 98% 95%   Vitals:   08/31/19 0127 08/31/19 0339 08/31/19 0444 08/31/19 0747  BP: 119/88  105/73 119/60  Pulse: 89  92 92  Resp: 16  14 19   Temp: 98.2 F (36.8 C)  99.1 F (37.3 C) 98.5 F (36.9 C)  TempSrc: Oral  Oral Axillary  SpO2: 97%  98% 95%  Weight:  72.8 kg    Height:        General: Pt is alert, awake, not in acute distress, on room air. Tracheostomy scar with some secretions, Band-Aid applied. Cardiovascular: RRR, S1/S2 +, no rubs, no gallops Respiratory: CTA bilaterally, no wheezing, no rhonchi Abdominal: Soft, NT, ND, bowel sounds +, PEG tube in place. Extremities: no edema, no cyanosis Neurological exam: Left hemineglect.  Left-sided complete hemiplegia.    The results of significant diagnostics from this hospitalization (including imaging, microbiology, ancillary and laboratory) are listed below for reference.     Microbiology: No results found for this or any previous visit (from the past 240 hour(s)).   Labs: BNP (last 3 results) No results for input(s): BNP in the last 8760 hours. Basic Metabolic Panel: Recent Labs  Lab 08/25/19 0500 08/26/19 0416 08/29/19 0445  NA 135 136 137  K 3.7 3.4* 3.8  CL 95* 98 100  CO2 31 31 30   GLUCOSE 124* 114* 118*  BUN 16 15 15   CREATININE 0.42* 0.50 0.51  CALCIUM 10.5* 10.6* 10.4*   Liver Function Tests: No results for input(s): AST, ALT, ALKPHOS, BILITOT, PROT, ALBUMIN in the last 168 hours. No results for input(s): LIPASE, AMYLASE in the last 168 hours. No results for input(s): AMMONIA in the last 168 hours. CBC: Recent Labs  Lab 08/25/19 0500 08/26/19 0416 08/29/19 0445  WBC 8.9 8.0 6.8  HGB 8.9* 9.1* 9.0*  HCT 28.2* 29.0* 28.4*  MCV 89.2 89.0 89.0  PLT 144* 144* 117*   Cardiac Enzymes: No results for input(s): CKTOTAL,  CKMB, CKMBINDEX, TROPONINI in the last 168 hours. BNP: Invalid input(s): POCBNP CBG: Recent Labs  Lab 08/30/19 1828 08/30/19 2022 08/31/19 0102 08/31/19 0516 08/31/19 0755  GLUCAP 101* 99 125* 103* 123*   D-Dimer No results for input(s): DDIMER in the last 72 hours. Hgb A1c No results for input(s): HGBA1C in the last 72 hours. Lipid Profile No results for input(s): CHOL, HDL, LDLCALC, TRIG, CHOLHDL, LDLDIRECT in the last 72 hours. Thyroid function studies No results for input(s): TSH, T4TOTAL, T3FREE, THYROIDAB in the last 72 hours.  Invalid input(s): FREET3 Anemia work up No results for input(s): VITAMINB12, FOLATE, FERRITIN, TIBC, IRON, RETICCTPCT in the last 72 hours. Urinalysis    Component Value Date/Time   COLORURINE YELLOW 08/06/2019 0817   APPEARANCEUR HAZY (A) 08/06/2019 0817   LABSPEC 1.020 08/06/2019 0817   PHURINE 5.0 08/06/2019 0817   GLUCOSEU NEGATIVE 08/06/2019 0817   HGBUR NEGATIVE 08/06/2019 0817   BILIRUBINUR NEGATIVE 08/06/2019 0817   KETONESUR NEGATIVE 08/06/2019 0817   PROTEINUR NEGATIVE 08/06/2019 0817   NITRITE NEGATIVE 08/06/2019 0817   LEUKOCYTESUR SMALL (A) 08/06/2019 0817   Sepsis Labs Invalid input(s): PROCALCITONIN,  WBC,  LACTICIDVEN Microbiology No results found for this or any previous visit (from the past 240 hour(s)).   Time coordinating discharge: 40 minutes  SIGNED:   10/06/2019,  MD  [facility] 08/31/2019, 10:23 AM

## 2019-09-01 DIAGNOSIS — I629 Nontraumatic intracranial hemorrhage, unspecified: Secondary | ICD-10-CM

## 2019-09-01 DIAGNOSIS — J9621 Acute and chronic respiratory failure with hypoxia: Secondary | ICD-10-CM

## 2019-09-01 DIAGNOSIS — G4733 Obstructive sleep apnea (adult) (pediatric): Secondary | ICD-10-CM

## 2019-09-01 DIAGNOSIS — R1310 Dysphagia, unspecified: Secondary | ICD-10-CM

## 2019-09-02 DIAGNOSIS — R1310 Dysphagia, unspecified: Secondary | ICD-10-CM

## 2019-09-02 DIAGNOSIS — I629 Nontraumatic intracranial hemorrhage, unspecified: Secondary | ICD-10-CM

## 2019-09-02 DIAGNOSIS — J9621 Acute and chronic respiratory failure with hypoxia: Secondary | ICD-10-CM

## 2019-09-02 DIAGNOSIS — G4733 Obstructive sleep apnea (adult) (pediatric): Secondary | ICD-10-CM

## 2019-09-03 DIAGNOSIS — G4733 Obstructive sleep apnea (adult) (pediatric): Secondary | ICD-10-CM

## 2019-09-03 DIAGNOSIS — J9621 Acute and chronic respiratory failure with hypoxia: Secondary | ICD-10-CM

## 2019-09-03 DIAGNOSIS — I629 Nontraumatic intracranial hemorrhage, unspecified: Secondary | ICD-10-CM

## 2019-09-03 DIAGNOSIS — R1310 Dysphagia, unspecified: Secondary | ICD-10-CM

## 2019-09-04 DIAGNOSIS — I629 Nontraumatic intracranial hemorrhage, unspecified: Secondary | ICD-10-CM

## 2019-09-04 DIAGNOSIS — R1310 Dysphagia, unspecified: Secondary | ICD-10-CM

## 2019-09-04 DIAGNOSIS — G4733 Obstructive sleep apnea (adult) (pediatric): Secondary | ICD-10-CM

## 2019-09-04 DIAGNOSIS — J9621 Acute and chronic respiratory failure with hypoxia: Secondary | ICD-10-CM

## 2019-09-12 DIAGNOSIS — I629 Nontraumatic intracranial hemorrhage, unspecified: Secondary | ICD-10-CM

## 2019-09-12 DIAGNOSIS — G4733 Obstructive sleep apnea (adult) (pediatric): Secondary | ICD-10-CM

## 2019-09-12 DIAGNOSIS — R1310 Dysphagia, unspecified: Secondary | ICD-10-CM

## 2019-09-12 DIAGNOSIS — J9621 Acute and chronic respiratory failure with hypoxia: Secondary | ICD-10-CM

## 2019-09-13 DIAGNOSIS — I629 Nontraumatic intracranial hemorrhage, unspecified: Secondary | ICD-10-CM

## 2019-09-13 DIAGNOSIS — G4733 Obstructive sleep apnea (adult) (pediatric): Secondary | ICD-10-CM

## 2019-09-13 DIAGNOSIS — J9621 Acute and chronic respiratory failure with hypoxia: Secondary | ICD-10-CM

## 2019-09-13 DIAGNOSIS — R1310 Dysphagia, unspecified: Secondary | ICD-10-CM

## 2019-09-14 DIAGNOSIS — G4733 Obstructive sleep apnea (adult) (pediatric): Secondary | ICD-10-CM

## 2019-09-14 DIAGNOSIS — I629 Nontraumatic intracranial hemorrhage, unspecified: Secondary | ICD-10-CM

## 2019-09-14 DIAGNOSIS — R1310 Dysphagia, unspecified: Secondary | ICD-10-CM

## 2019-09-14 DIAGNOSIS — J9621 Acute and chronic respiratory failure with hypoxia: Secondary | ICD-10-CM

## 2019-09-15 DIAGNOSIS — J9621 Acute and chronic respiratory failure with hypoxia: Secondary | ICD-10-CM

## 2019-09-15 DIAGNOSIS — G4733 Obstructive sleep apnea (adult) (pediatric): Secondary | ICD-10-CM

## 2019-09-15 DIAGNOSIS — I629 Nontraumatic intracranial hemorrhage, unspecified: Secondary | ICD-10-CM

## 2019-09-15 DIAGNOSIS — R1310 Dysphagia, unspecified: Secondary | ICD-10-CM

## 2019-09-16 DIAGNOSIS — G4733 Obstructive sleep apnea (adult) (pediatric): Secondary | ICD-10-CM

## 2019-09-16 DIAGNOSIS — R1310 Dysphagia, unspecified: Secondary | ICD-10-CM

## 2019-09-16 DIAGNOSIS — J9621 Acute and chronic respiratory failure with hypoxia: Secondary | ICD-10-CM

## 2019-09-16 DIAGNOSIS — I629 Nontraumatic intracranial hemorrhage, unspecified: Secondary | ICD-10-CM

## 2019-09-17 DIAGNOSIS — R1310 Dysphagia, unspecified: Secondary | ICD-10-CM

## 2019-09-17 DIAGNOSIS — G4733 Obstructive sleep apnea (adult) (pediatric): Secondary | ICD-10-CM

## 2019-09-17 DIAGNOSIS — J9621 Acute and chronic respiratory failure with hypoxia: Secondary | ICD-10-CM

## 2019-09-17 DIAGNOSIS — I629 Nontraumatic intracranial hemorrhage, unspecified: Secondary | ICD-10-CM

## 2019-09-18 DIAGNOSIS — I629 Nontraumatic intracranial hemorrhage, unspecified: Secondary | ICD-10-CM

## 2019-09-18 DIAGNOSIS — R1310 Dysphagia, unspecified: Secondary | ICD-10-CM

## 2019-09-18 DIAGNOSIS — J9621 Acute and chronic respiratory failure with hypoxia: Secondary | ICD-10-CM

## 2019-09-18 DIAGNOSIS — G4733 Obstructive sleep apnea (adult) (pediatric): Secondary | ICD-10-CM

## 2019-09-21 ENCOUNTER — Inpatient Hospital Stay
Admission: RE | Admit: 2019-09-21 | Discharge: 2019-10-19 | Disposition: A | Discharge status: Home / Self Care | Payer: 59 | Source: Ambulatory Visit | Attending: Internal Medicine | Admitting: Internal Medicine

## 2019-09-22 ENCOUNTER — Non-Acute Institutional Stay (SKILLED_NURSING_FACILITY): Payer: Self-pay | Admitting: Adult Health

## 2019-09-22 ENCOUNTER — Other Ambulatory Visit (HOSPITAL_COMMUNITY)
Admission: RE | Admit: 2019-09-22 | Discharge: 2019-09-22 | Disposition: A | Discharge status: Home / Self Care | Payer: 59 | Source: Skilled Nursing Facility | Attending: Internal Medicine | Admitting: Internal Medicine

## 2019-09-22 ENCOUNTER — Telehealth (HOSPITAL_COMMUNITY): Payer: Self-pay | Admitting: Physical Therapy

## 2019-09-22 ENCOUNTER — Encounter: Payer: Self-pay | Admitting: Adult Health

## 2019-09-22 DIAGNOSIS — E1149 Type 2 diabetes mellitus with other diabetic neurological complication: Secondary | ICD-10-CM | POA: Insufficient documentation

## 2019-09-22 DIAGNOSIS — E1159 Type 2 diabetes mellitus with other circulatory complications: Secondary | ICD-10-CM

## 2019-09-22 DIAGNOSIS — I69854 Hemiplegia and hemiparesis following other cerebrovascular disease affecting left non-dominant side: Secondary | ICD-10-CM

## 2019-09-22 DIAGNOSIS — I69391 Dysphagia following cerebral infarction: Secondary | ICD-10-CM | POA: Insufficient documentation

## 2019-09-22 DIAGNOSIS — J9601 Acute respiratory failure with hypoxia: Secondary | ICD-10-CM

## 2019-09-22 DIAGNOSIS — I69954 Hemiplegia and hemiparesis following unspecified cerebrovascular disease affecting left non-dominant side: Secondary | ICD-10-CM | POA: Insufficient documentation

## 2019-09-22 DIAGNOSIS — K5909 Other constipation: Secondary | ICD-10-CM

## 2019-09-22 DIAGNOSIS — K219 Gastro-esophageal reflux disease without esophagitis: Secondary | ICD-10-CM

## 2019-09-22 DIAGNOSIS — I615 Nontraumatic intracerebral hemorrhage, intraventricular: Secondary | ICD-10-CM | POA: Insufficient documentation

## 2019-09-22 DIAGNOSIS — I10 Essential (primary) hypertension: Secondary | ICD-10-CM

## 2019-09-22 DIAGNOSIS — F339 Major depressive disorder, recurrent, unspecified: Secondary | ICD-10-CM

## 2019-09-22 LAB — COMPREHENSIVE METABOLIC PANEL
ALT: 44 U/L (ref 0–44)
AST: 24 U/L (ref 15–41)
Albumin: 3.4 g/dL — ABNORMAL LOW (ref 3.5–5.0)
Alkaline Phosphatase: 80 U/L (ref 38–126)
Anion gap: 10 (ref 5–15)
BUN: 9 mg/dL (ref 6–20)
CO2: 30 mmol/L (ref 22–32)
Calcium: 10.4 mg/dL — ABNORMAL HIGH (ref 8.9–10.3)
Chloride: 102 mmol/L (ref 98–111)
Creatinine, Ser: 0.41 mg/dL — ABNORMAL LOW (ref 0.44–1.00)
GFR calc Af Amer: >60 mL/min (ref >60)
GFR calc non Af Amer: >60 mL/min (ref >60)
Glucose, Bld: 121 mg/dL — ABNORMAL HIGH (ref 70–99)
Potassium: 4 mmol/L (ref 3.5–5.1)
Sodium: 142 mmol/L (ref 135–145)
Total Bilirubin: 0.6 mg/dL (ref 0.3–1.2)
Total Protein: 7.1 g/dL (ref 6.5–8.1)

## 2019-09-22 LAB — CBC WITH DIFFERENTIAL/PLATELET
Abs Immature Granulocytes: 0.08 10*3/uL — ABNORMAL HIGH (ref 0.00–0.07)
Basophils Absolute: 0.1 10*3/uL (ref 0.0–0.1)
Basophils Relative: 1 %
Eosinophils Absolute: 0.1 10*3/uL (ref 0.0–0.5)
Eosinophils Relative: 1 %
HCT: 34.2 % — ABNORMAL LOW (ref 36.0–46.0)
Hemoglobin: 10.7 g/dL — ABNORMAL LOW (ref 12.0–15.0)
Immature Granulocytes: 1 %
Lymphocytes Relative: 20 %
Lymphs Abs: 1.5 10*3/uL (ref 0.7–4.0)
MCH: 28.5 pg (ref 26.0–34.0)
MCHC: 31.3 g/dL (ref 30.0–36.0)
MCV: 91.2 fL (ref 80.0–100.0)
Monocytes Absolute: 0.7 10*3/uL (ref 0.1–1.0)
Monocytes Relative: 9 %
Neutro Abs: 5 10*3/uL (ref 1.7–7.7)
Neutrophils Relative %: 68 %
Platelets: 229 10*3/uL (ref 150–400)
RBC: 3.75 MIL/uL — ABNORMAL LOW (ref 3.87–5.11)
RDW: 15 % (ref 11.5–15.5)
WBC: 7.3 10*3/uL (ref 4.0–10.5)
nRBC: 0 % (ref 0.0–0.2)

## 2019-09-22 NOTE — Progress Notes (Signed)
Location:    [facility] Room Number: 154/D Place of Service:  SNF (31)   CODE STATUS: Full Code  Allergies  Allergen Reactions  . Losartan Swelling  . Norvasc [Amlodipine] Swelling  . Penicillins     Unable to verify reactions at this time/ Tolerated ceftriaxone March 2021    Chief Complaint  Patient presents with  . Hospitalization Follow-up    Hospitalization Follow Up    HPI:  She is a 59 year old woman who has had a prolonged hospitalization which included an admission to [facility] from 08-31-19 through 09-21-19. 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 She did require a tracheostomy which has since been removed. She had a peg tube; which she continues to use. She was admitted to [facility] for subacute care. She does have left hemiplegia present. She is here for short term rehab with her goal to return back home. She does not have pain present. There are no signs of aspiration present. No reports constipation present. She will continue to be followed for her chronic illnesses including: cva; hypertension; diabetes.   Past Medical History:  Diagnosis Date  . COPD 06/27/2008   Qualifier: Diagnosis of  By: Craige Cotta MD, Vineet    . Hypertension   . IVH (intraventricular hemorrhage) (HCC) 07/18/2019  . OBSTRUCTIVE SLEEP APNEA 06/27/2008   Qualifier: Diagnosis of  By: Craige Cotta MD, Vineet      Past Surgical History:  Procedure Laterality Date  . IR GASTROSTOMY TUBE MOD SED  08/03/2019    Social History   Socioeconomic History  . Marital status: Single    Spouse name: Not on file  . Number of children: Not on file  . Years of education: Not on file  . Highest education level: Not on file  Occupational History  . Not on file  Tobacco Use  . Smoking status: Never Smoker  . Smokeless tobacco: Never Used  Substance and Sexual Activity  . Alcohol use: Not on file  . Drug use: Not on file  . Sexual activity: Not on file  Other Topics Concern    . Not on file  Social History Narrative  . Not on file   Social Determinants of Health   Financial Resource Strain:   . Difficulty of Paying Living Expenses:   Food Insecurity:   . Worried About Programme researcher, broadcasting/film/video in the Last Year:   . Barista in the Last Year:   Transportation Needs:   . Freight forwarder (Medical):   Marland Kitchen Lack of Transportation (Non-Medical):   Physical Activity:   . Days of Exercise per Week:   . Minutes of Exercise per Session:   Stress:   . Feeling of Stress :   Social Connections:   . Frequency of Communication with Friends and Family:   . Frequency of Social Gatherings with Friends and Family:   . Attends Religious Services:   . Active Member of Clubs or Organizations:   . Attends Banker Meetings:   Marland Kitchen Marital Status:   Intimate Partner Violence:   . Fear of Current or Ex-Partner:   . Emotionally Abused:   Marland Kitchen Physically Abused:   . Sexually Abused:    Family History  Problem Relation Age of Onset  . Hypertension Father   . Diabetes Father       VITAL SIGNS BP 131/74   Pulse 87   Temp 97.6 F (36.4 C) (Oral)   Resp  20   Ht 5' (1.524 m)   Wt 148 lb 12.8 oz (67.5 kg)   BMI 29.06 kg/m   Outpatient Encounter Medications as of 09/22/2019  Medication Sig  . Acetaminophen 650 MG/20.3ML SOLN 650 mg by Gastric Tube route every 6 (six) hours as needed (AS NEEDED FOR PAIN).  Marland Kitchen albuterol (PROVENTIL) (2.5 MG/3ML) 0.083% nebulizer solution Take 3 mLs (2.5 mg total) by nebulization every 4 (four) hours as needed for wheezing.  . bisacodyl (DULCOLAX) 10 MG suppository Place 10 mg rectally daily.  . BUSPIRONE HCL PO 5 mg by Gastric Tube route every 12 (twelve) hours.  . enoxaparin (LOVENOX) 40 MG/0.4ML injection Inject 40 mg into the skin daily.  . famotidine (PEPCID) 20 MG tablet Place 1 tablet (20 mg total) into feeding tube 2 (two) times daily.  Marland Kitchen HYDRALAZINE HCL PO 100 mg by Gastric Tube route every 6 (six) hours.  .  insulin aspart (NOVOLOG FLEXPEN) 100 UNIT/ML FlexPen Inject 5 Units into the skin 3 (three) times daily with meals.  . insulin glargine (LANTUS) 100 UNIT/ML injection Inject 0.24 mLs (24 Units total) into the skin daily.  Marland Kitchen LABETALOL HCL PO 200 mg by Gastric Tube route every 8 (eight) hours.  . NON FORMULARY Accu-check ac & qhs. Notify provider of results under 60 or over 400. Four Times A Day 06:30 AM, 11:30 AM, 04:30 PM, 09:00 PM  . NON FORMULARY Diet: Pureed diet with nectar thick liquids  . NON FORMULARY Flush g-tube with 30cc free water before and after medication administration. Document total intake qshift. Every Shift Day, Evening, Night  . Nutritional Supplements (ISOSOURCE) LIQD by Gastric Tube route. Isosource by g-tube at 73ml/hr continuous feed. Document total intake qshift. Special Instructions: Keep HOB elevated at 45degrees during feeds/flushes. Every Shift Day, Evening, Night  . polyethylene glycol (MIRALAX / GLYCOLAX) 17 g packet Place 17 g into feeding tube daily.  . TRAZODONE HCL PO 50 mg by Gastric Tube route at bedtime as needed (As needed for inability to sleep x 2 weeks. Attempt and document a non-drug intervention and outcome prior to administering medication.).   No facility-administered encounter medications on file as of 09/22/2019.     SIGNIFICANT DIAGNOSTIC EXAMS  TODAY  07-16-19: ct of head:  3.2 x 2.9 x 4.2 cm acute parenchymal hemorrhage with surrounding edema centered within the posterior right basal ganglia and right thalamus. Moderate volume intraventricular extension. Mass effect with partial effacement of the right lateral and third ventricles, and 4 mm leftward midline shift. Mild prominence of the lateral ventricles suspicious for early entrapment/hydrocephalus.  07-19-19: 2-d echo:  Left ventricular ejection fraction, by estimation, is 60 to 65%. The  left ventricle has hyperdynamic function. The left ventricle has no  regional wall motion  abnormalities. There is severe concentric left  ventricular hypertrophy. Indeterminate  diastolic filling due to E-A fusion. Consider evalution for possible  cardiac amyloid given severe LV wall thickness and pericardial effusion  with cMRI or PYP scan.   07-20-19: ct angio of head:  1. No arterial finding to explain the patient's thalamic hemorrhage. 2. Size stable thalamic hematoma and midline shift (7 mm). 3. Stable EVD positioning and ventriculomegaly.  08-08-19: ct of head:  Continued improvement in right thalamic hemorrhage. Small amount of ventricular hemorrhage with improvement. Mild ventricular enlargement unchanged. Mild midline shift to the left slightly improved.  08-08-19: EEG:  This study is suggestive of cortical dysfunction in right frontotemporal region likely secondary to underlying hemorrhage as well as  moderate diffuse encephalopathy, nonspecific to etiology. No seizures or epileptiform discharges were seen throughout the recording.   LABS REVIEWED TODAY;   07-19-19: hgb a1c 5.7 07-20-19: chol 180;ldl 63; trig 362; ldl 45 09-22-19: wbc 7.3; hgb 10.7; hct 34.2; mcv 91.2 plt 229; glucose 121; bun 9 creat 0.41; k+ 4.0; na++ 142; ca 10.4; liver normal albumin 3.4    Review of Systems  Constitutional: Negative for malaise/fatigue.  Respiratory: Negative for cough and shortness of breath.   Cardiovascular: Negative for chest pain, palpitations and leg swelling.  Gastrointestinal: Negative for abdominal pain, constipation and heartburn.  Musculoskeletal: Negative for back pain, joint pain and myalgias.  Skin: Negative.   Neurological: Negative for dizziness.  Psychiatric/Behavioral: The patient is not nervous/anxious.     Physical Exam Constitutional:      General: She is not in acute distress.    Appearance: She is well-developed. She is not diaphoretic.  Neck:     Thyroid: No thyromegaly.  Cardiovascular:     Rate and Rhythm: Normal rate and regular rhythm.     Pulses:  Normal pulses.     Heart sounds: Normal heart sounds.  Pulmonary:     Effort: Pulmonary effort is normal. No respiratory distress.     Breath sounds: Normal breath sounds.  Abdominal:     General: Bowel sounds are normal. There is no distension.     Palpations: Abdomen is soft.     Tenderness: There is no abdominal tenderness.     Comments: Peg tube present   Musculoskeletal:     Cervical back: Neck supple.     Right lower leg: No edema.     Left lower leg: No edema.     Comments: Left hemiplegia   Lymphadenopathy:     Cervical: No cervical adenopathy.  Skin:    General: Skin is warm and dry.  Neurological:     Mental Status: She is alert and oriented to person, place, and time.  Psychiatric:        Mood and Affect: Mood normal.      ASSESSMENT/ PLAN:  TODAY  1. 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   2. Acute respiratory failure with hypoxemia: is stable will continue albuterol neb treatment every 4 hours as needed  3. Dysphagia due to cerebrovascular accident: no signs of aspiration present is on nectar thick liquids; is on tube feeding 40 cc per hour will monitor   4. GERD without esophagitis: is stable will continue pepcid 20 mg twice daily   5. Type 2 diabetes mellitus with neurological complications: is stable hgb a1c 5.7. will continue lantus 24 units daily novolog 5 units with meals.   6. Hypertension associated with type 2 diabetes mellitus: is stable b/p 131/74: will continue labetolol 200 mg every 8 hours; hydralazine 100 mg every 6 hours; will monitor  7. Major depression recurrent chronic: is stable will continue buspar 5 mg twice daily and is taking trazodone 50 mg nightly as needed through 10-04-19   8. Chronic constipation: is stable  will continue miralax via tube; will continue dulcolax suppository daily          MD is aware of resident's narcotic use and is in agreement with current plan of care. We will attempt to wean resident as appropriate.  Synthia Innocent NP  [place] [phone] Monday through Friday 8am- 5pm  After hours call [phone]

## 2019-09-23 ENCOUNTER — Encounter: Payer: Self-pay | Admitting: Adult Health

## 2019-09-23 ENCOUNTER — Non-Acute Institutional Stay (SKILLED_NURSING_FACILITY): Payer: 59 | Admitting: Adult Health

## 2019-09-23 DIAGNOSIS — I69854 Hemiplegia and hemiparesis following other cerebrovascular disease affecting left non-dominant side: Secondary | ICD-10-CM | POA: Diagnosis not present

## 2019-09-23 DIAGNOSIS — I69391 Dysphagia following cerebral infarction: Secondary | ICD-10-CM

## 2019-09-23 DIAGNOSIS — I615 Nontraumatic intracerebral hemorrhage, intraventricular: Secondary | ICD-10-CM | POA: Diagnosis not present

## 2019-09-23 NOTE — Progress Notes (Signed)
Location:    [facility] Room Number: 154/D Place of Service:  SNF (31)   CODE STATUS: Full Code  Allergies  Allergen Reactions  . Losartan Swelling  . Norvasc [Amlodipine] Swelling  . Penicillins     Unable to verify reactions at this time/ Tolerated ceftriaxone March 2021    Chief Complaint  Patient presents with  . Acute Visit    72 Hour Care Plan    HPI:  She has been admitted to this facility for short term rehab. Her goal is to go home. She lives in a 2 story home; she does not have any dme. She was not checking cbgs and was not giving insulin prior to her hospitalization. She is well motivated to participate in therapy. Her appetite is poor. She remains total care with her adls; has left hemiplegia. She continues to be followed for her chronic illnesses including: ivh; hemiplegia; dysphagia   Past Medical History:  Diagnosis Date  . COPD 06/27/2008   Qualifier: Diagnosis of  By: Craige Cotta MD, Vineet    . Hypertension   . IVH (intraventricular hemorrhage) (HCC) 07/18/2019  . OBSTRUCTIVE SLEEP APNEA 06/27/2008   Qualifier: Diagnosis of  By: Craige Cotta MD, Vineet      Past Surgical History:  Procedure Laterality Date  . IR GASTROSTOMY TUBE MOD SED  08/03/2019    Social History   Socioeconomic History  . Marital status: Single    Spouse name: Not on file  . Number of children: Not on file  . Years of education: Not on file  . Highest education level: Not on file  Occupational History  . Not on file  Tobacco Use  . Smoking status: Never Smoker  . Smokeless tobacco: Never Used  Substance and Sexual Activity  . Alcohol use: Not on file  . Drug use: Not on file  . Sexual activity: Not on file  Other Topics Concern  . Not on file  Social History Narrative  . Not on file   Social Determinants of Health   Financial Resource Strain:   . Difficulty of Paying Living Expenses:   Food Insecurity:   . Worried About Programme researcher, broadcasting/film/video in the Last Year:    . Barista in the Last Year:   Transportation Needs:   . Freight forwarder (Medical):   Marland Kitchen Lack of Transportation (Non-Medical):   Physical Activity:   . Days of Exercise per Week:   . Minutes of Exercise per Session:   Stress:   . Feeling of Stress :   Social Connections:   . Frequency of Communication with Friends and Family:   . Frequency of Social Gatherings with Friends and Family:   . Attends Religious Services:   . Active Member of Clubs or Organizations:   . Attends Banker Meetings:   Marland Kitchen Marital Status:   Intimate Partner Violence:   . Fear of Current or Ex-Partner:   . Emotionally Abused:   Marland Kitchen Physically Abused:   . Sexually Abused:    Family History  Problem Relation Age of Onset  . Hypertension Father   . Diabetes Father       VITAL SIGNS BP 101/67   Pulse (!) 59   Temp 98 F (36.7 C) (Oral)   Resp 16   Ht 5' (1.524 m)   Wt 148 lb 12.8 oz (67.5 kg)   BMI 29.06 kg/m   Outpatient Encounter Medications as of 09/23/2019  Medication Sig  .  Acetaminophen 650 MG/20.3ML SOLN 650 mg by Gastric Tube route every 6 (six) hours as needed (AS NEEDED FOR PAIN).  Marland Kitchen albuterol (PROVENTIL) (2.5 MG/3ML) 0.083% nebulizer solution Take 3 mLs (2.5 mg total) by nebulization every 4 (four) hours as needed for wheezing.  . bisacodyl (DULCOLAX) 10 MG suppository Place 10 mg rectally daily.  . BUSPIRONE HCL PO 5 mg by Gastric Tube route every 12 (twelve) hours.  . enoxaparin (LOVENOX) 40 MG/0.4ML injection Inject 40 mg into the skin daily.  . famotidine (PEPCID) 20 MG tablet Place 1 tablet (20 mg total) into feeding tube 2 (two) times daily.  Marland Kitchen HYDRALAZINE HCL PO 100 mg by Gastric Tube route every 6 (six) hours.  . insulin aspart (NOVOLOG FLEXPEN) 100 UNIT/ML FlexPen Inject 5 Units into the skin 3 (three) times daily with meals.  . insulin glargine (LANTUS) 100 UNIT/ML injection Inject 0.24 mLs (24 Units total) into the skin daily.  Marland Kitchen LABETALOL HCL PO 200  mg by Gastric Tube route every 8 (eight) hours.  . NON FORMULARY Accu-check ac & qhs. Notify provider of results under 60 or over 400. Four Times A Day 06:30 AM, 11:30 AM, 04:30 PM, 09:00 PM  . NON FORMULARY Diet: Pureed diet with nectar thick liquids  . NON FORMULARY Flush g-tube with 240 ml free water TID. Three Times A Day 09:00 AM, 02:00 PM, 09:00 PM  . Nutritional Supplements (FEEDING SUPPLEMENT, OSMOLITE 1.2 CAL,) LIQD Place into feeding tube continuous. Osmolite 1.2 by g-tube at 49ml/hr continuous feed. Document total intake qshift. Special Instructions: Keep HOB elevated at 45degrees during feeds/flushes. Every Shift Day, Evening, Night  . polyethylene glycol (MIRALAX / GLYCOLAX) 17 g packet Place 17 g into feeding tube daily.  . TRAZODONE HCL PO 50 mg by Gastric Tube route at bedtime as needed (As needed for inability to sleep x 2 weeks. Attempt and document a non-drug intervention and outcome prior to administering medication.).  . [DISCONTINUED] Nutritional Supplements (ISOSOURCE) LIQD by Gastric Tube route. Isosource by g-tube at 53ml/hr continuous feed. Document total intake qshift. Special Instructions: Keep HOB elevated at 45degrees during feeds/flushes. Every Shift Day, Evening, Night   No facility-administered encounter medications on file as of 09/23/2019.     SIGNIFICANT DIAGNOSTIC EXAMS  PREVIOUS  07-16-19: ct of head:  3.2 x 2.9 x 4.2 cm acute parenchymal hemorrhage with surrounding edema centered within the posterior right basal ganglia and right thalamus. Moderate volume intraventricular extension. Mass effect with partial effacement of the right lateral and third ventricles, and 4 mm leftward midline shift. Mild prominence of the lateral ventricles suspicious for early entrapment/hydrocephalus.  07-19-19: 2-d echo:  Left ventricular ejection fraction, by estimation, is 60 to 65%. The  left ventricle has hyperdynamic function. The left ventricle has no  regional  wall motion abnormalities. There is severe concentric left  ventricular hypertrophy. Indeterminate  diastolic filling due to E-A fusion. Consider evalution for possible  cardiac amyloid given severe LV wall thickness and pericardial effusion  with cMRI or PYP scan.   07-20-19: ct angio of head:  1. No arterial finding to explain the patient's thalamic hemorrhage. 2. Size stable thalamic hematoma and midline shift (7 mm). 3. Stable EVD positioning and ventriculomegaly.  08-08-19: ct of head:  Continued improvement in right thalamic hemorrhage. Small amount of ventricular hemorrhage with improvement. Mild ventricular enlargement unchanged. Mild midline shift to the left slightly improved.  08-08-19: EEG:  This study is suggestive of cortical dysfunction in right frontotemporal region likely secondary  to underlying hemorrhage as well as moderate diffuse encephalopathy, nonspecific to etiology. No seizures or epileptiform discharges were seen throughout the recording.  NO NEW EXAMS.    LABS REVIEWED PREVIOUS   07-19-19: hgb a1c 5.7 07-20-19: chol 180;ldl 63; trig 362; ldl 45 09-22-19: wbc 7.3; hgb 10.7; hct 34.2; mcv 91.2 plt 229; glucose 121; bun 9 creat 0.41; k+ 4.0; na++ 142; ca 10.4; liver normal albumin 3.4   NO NEW LABS.    Review of Systems  Constitutional: Negative for malaise/fatigue.  Respiratory: Negative for cough and shortness of breath.   Cardiovascular: Negative for chest pain, palpitations and leg swelling.  Gastrointestinal: Negative for abdominal pain, constipation and heartburn.  Musculoskeletal: Negative for back pain, joint pain and myalgias.  Skin: Negative.   Neurological: Negative for dizziness.  Psychiatric/Behavioral: The patient is not nervous/anxious.       Physical Exam Constitutional:      General: She is not in acute distress.    Appearance: She is well-developed. She is not diaphoretic.  Neck:     Thyroid: No thyromegaly.  Cardiovascular:     Rate and  Rhythm: Normal rate and regular rhythm.     Pulses: Normal pulses.     Heart sounds: Normal heart sounds.  Pulmonary:     Effort: Pulmonary effort is normal. No respiratory distress.     Breath sounds: Normal breath sounds.  Abdominal:     General: Bowel sounds are normal. There is no distension.     Palpations: Abdomen is soft.     Tenderness: There is no abdominal tenderness.     Comments: Peg tube present   Musculoskeletal:     Cervical back: Neck supple.     Comments: Flaccid left hemiplegia   Lymphadenopathy:     Cervical: No cervical adenopathy.  Skin:    General: Skin is warm and dry.  Neurological:     Mental Status: She is alert and oriented to person, place, and time.  Psychiatric:        Mood and Affect: Mood normal.      ASSESSMENT/ PLAN:  TODAY  1. IVH (intraventricular hemorrhage) 2. Hemiplegia of left non-dominant side of other cerebrovascular disease unspecified hemiplegia type 3. Dysphagia due to recent cerebrovascular accident  Will continue therapy as directed Will continue current medications Will continue to monitor her status.   MD is aware of resident's narcotic use and is in agreement with current plan of care. We will attempt to wean resident as appropriate.  Synthia Innocent NP [facility]  Contact [phone] Monday through Friday 8am- 5pm  After hours call [phone]

## 2019-09-26 ENCOUNTER — Other Ambulatory Visit (HOSPITAL_COMMUNITY): Payer: Self-pay | Admitting: Specialist

## 2019-09-26 DIAGNOSIS — R1319 Other dysphagia: Secondary | ICD-10-CM

## 2019-09-26 DIAGNOSIS — I619 Nontraumatic intracerebral hemorrhage, unspecified: Secondary | ICD-10-CM

## 2019-09-27 ENCOUNTER — Ambulatory Visit (HOSPITAL_COMMUNITY): Payer: 59 | Attending: Adult Health | Admitting: Speech Pathology

## 2019-09-27 ENCOUNTER — Inpatient Hospital Stay (HOSPITAL_COMMUNITY)
Admit: 2019-09-27 | Discharge: 2019-09-27 | Disposition: A | Discharge status: Home / Self Care | Payer: 59 | Attending: Adult Health | Admitting: Adult Health

## 2019-09-27 ENCOUNTER — Non-Acute Institutional Stay (SKILLED_NURSING_FACILITY): Payer: 59 | Admitting: Internal Medicine

## 2019-09-27 ENCOUNTER — Encounter: Payer: Self-pay | Admitting: Internal Medicine

## 2019-09-27 ENCOUNTER — Encounter (HOSPITAL_COMMUNITY): Payer: Self-pay | Admitting: Speech Pathology

## 2019-09-27 DIAGNOSIS — I69391 Dysphagia following cerebral infarction: Secondary | ICD-10-CM

## 2019-09-27 DIAGNOSIS — E1149 Type 2 diabetes mellitus with other diabetic neurological complication: Secondary | ICD-10-CM | POA: Diagnosis not present

## 2019-09-27 DIAGNOSIS — R197 Diarrhea, unspecified: Secondary | ICD-10-CM | POA: Diagnosis not present

## 2019-09-27 DIAGNOSIS — I619 Nontraumatic intracerebral hemorrhage, unspecified: Secondary | ICD-10-CM

## 2019-09-27 DIAGNOSIS — R1312 Dysphagia, oropharyngeal phase: Secondary | ICD-10-CM | POA: Insufficient documentation

## 2019-09-27 DIAGNOSIS — D649 Anemia, unspecified: Secondary | ICD-10-CM | POA: Insufficient documentation

## 2019-09-27 DIAGNOSIS — I615 Nontraumatic intracerebral hemorrhage, intraventricular: Secondary | ICD-10-CM | POA: Diagnosis not present

## 2019-09-27 DIAGNOSIS — R1319 Other dysphagia: Secondary | ICD-10-CM

## 2019-09-27 DIAGNOSIS — H532 Diplopia: Secondary | ICD-10-CM | POA: Insufficient documentation

## 2019-09-27 NOTE — Assessment & Plan Note (Addendum)
Therapy reports diarrhea since admission.  MiraLAX and Dulcolax discontinued.  Probably related to Glucerna tube feedings. C. difficile studies if persists post discontinuing MiraLAX and Dulcolax.

## 2019-09-27 NOTE — Assessment & Plan Note (Signed)
09/27/2019 new onset subjective diplopia today  Intermittent exotropia on the left.  With inferior gaze left eye deviates significantly more than right. Neurologist will be contacted concerning this new finding.

## 2019-09-27 NOTE — Assessment & Plan Note (Addendum)
09/22/2019 anemia improved with H/H 10.7/34.2 Monitor for any bleeding dyscrasias. On Lovenox prophylaxis.

## 2019-09-27 NOTE — Therapy (Signed)
[facility] [address] [place], [place], [address] Phone: [phone]   Fax:  [phone]  Modified Barium Swallow  Patient Details  Name: Haley Hernandez MRN: [id number] Date of Birth: 1960/12/19 No data recorded  Encounter Date: 09/27/2019  End of Session - 09/27/19 1703    Visit Number  1    Number of Visits  1    Authorization Type  [employer], Medicaid pending    SLP Start Time  1330    SLP Stop Time   1405    SLP Time Calculation (min)  35 min    Activity Tolerance  Patient tolerated treatment well       Past Medical History:  Diagnosis Date  . COPD 06/27/2008   Qualifier: Diagnosis of  By: Craige Cotta MD, Vineet    . Hypertension   . IVH (intraventricular hemorrhage) (HCC) 07/18/2019  . OBSTRUCTIVE SLEEP APNEA 06/27/2008   Qualifier: Diagnosis of  By: Craige Cotta MD, Vineet      Past Surgical History:  Procedure Laterality Date  . IR GASTROSTOMY TUBE MOD SED  08/03/2019    There were no vitals filed for this visit.  Subjective Assessment - 09/27/19 1640    Subjective  "I need to be changed."    Special Tests  MBSS    Currently in Pain?  No/denies      MBSS from 08/30/19 with trach at [facility]: <<Pt presents with a primary oral, secondary cervical esophageal dysphagia.  Pharyngeal function, including bolus propulsion and laryngeal vestibule closure (LVC), were remarkably functional.  Oral control, labial seal, and containment of bolus were the most problematic with significant spillage from left oral cavity and reduced awareness.  Pt did not want to eat the pureed barium, stating "it's nasty," but was ultimately willing to eat two spoonsful.  Once all materials reached valleculae/pyriforms, a swallow was triggered, there was effective LVC, and material traversed easily through UES.  Notable was inconsistent backflow of all consistencies from the cervical esophagus into the pyriforms - on one occasion, there was trace spillage of this  backflow into the posterior larynx, triggering a cough response.  Pt is ready to begin PO trials initially with SLP, then should be able to progress to eating small amounts with trained family/staff. Concerns are less for aspiration during the swallow than backflow of contents of esophagus.  Therapeutic intervention should focus primarily on oral sensorimotor function. >>    General - 09/27/19 1647      General Information   Date of Onset  07/18/19    HPI  Haley Hernandez is a 59 yo female who was referred for MBSS by Synthia Innocent, NP due to dysphagia following a right thalamic and basal ganglia hemorrhagic stroke on 07/18/2019. She was admitted to the [facility] on 09/21/2019 after prolonged hospitalization which included an admission to [facility] 4/28-5/19/2021. She initially had a tracheostomy which has subsequently been removed.  She also has a PEG tube which is in use. She is on a pured diet with nectar thick liquids. She had MBSS at [facility] on 08/30/19 which showed oral and esophageal dysphagia. Pt with past medical history of HTN, OSA, remote history of ICH.    Type of Study  MBS-Modified Barium Swallow Study    Previous Swallow Assessment  MBSS at [facility] 08/30/19, NPO with trials    Diet Prior to this Study  Dysphagia 1 (puree);Nectar-thick liquids    Temperature Spikes Noted  No    Respiratory Status  Room air  History of Recent Intubation  No    Behavior/Cognition  Alert;Cooperative;Pleasant mood   tearful   Oral Cavity Assessment  Within Functional Limits    Oral Care Completed by SLP  No    Oral Cavity - Dentition  Adequate natural dentition    Vision  Functional for self feeding    Self-Feeding Abilities  Able to feed self    Patient Positioning  Upright in chair    Baseline Vocal Quality  Normal    Volitional Cough  Strong    Volitional Swallow  Able to elicit    Anatomy  Within functional limits    Pharyngeal Secretions  Not observed secondary MBS         Oral Preparation/Oral Phase  - 09/27/19 1649      Oral Preparation/Oral Phase   Oral Phase  Impaired      Oral - Thin   Oral - Thin Teaspoon  Decreased bolus cohesion;Oral residue;Left anterior bolus loss    Oral - Thin Cup  Decreased bolus cohesion;Left anterior bolus loss;Oral residue    Oral - Thin Straw  Left anterior bolus loss;Decreased bolus cohesion;Oral residue      Oral - Solids   Oral - Puree  Decreased bolus cohesion    Oral - Regular  Weak ligual manipulation;Imparied mastication;Decreased bolus cohesion;Delayed A-P transit;Oral residue;Piecemeal swallowing    Oral - Pill  Absent A-P transit   unable to take with thin or puree     Electrical stimulation - Oral Phase   Was Electrical Stimulation Used  No       Pharyngeal Phase - 09/27/19 1655      Pharyngeal Phase   Pharyngeal Phase  Impaired      Pharyngeal - Thin   Pharyngeal- Thin Teaspoon  Swallow initiation at vallecula;Penetration/Aspiration during swallow    Pharyngeal  Material does not enter airway;Material enters airway, remains ABOVE vocal cords then ejected out    Pharyngeal- Thin Cup  Swallow initiation at vallecula   vallecular pooling from oral residue post swallow, cleared with repeat swallow   Pharyngeal- Thin Straw  Swallow initiation at vallecula;Swallow initiation at pyriform sinus;Pharyngeal residue - pyriform;Penetration/Aspiration during swallow    Pharyngeal  Material enters airway, passes BELOW cords then ejected out;Material does not enter airway      Pharyngeal - Solids   Pharyngeal- Puree  Swallow initiation at vallecula   PES backflow of puree to pyriforms   Pharyngeal- Regular  Delayed swallow initiation-pyriform sinuses      Electrical Stimulation - Pharyngeal Phase   Was Electrical Stimulation Used  No       Cricopharyngeal Phase - 09/27/19 1700      Cervical Esophageal Phase   Cervical Esophageal Phase  Impaired      Cervical Esophageal Phase - Solids   Puree  Esophageal backflow into cervical  esophagus;Esophageal backflow into the pharynx      Cervical Esophageal Phase - Comment   Cervical Esophageal Comment  backflow of bolus noted with puree and thin liquids at times    Other Esophageal Phase Observations  retrograde movement of barium noted with incomplete esophageal clearance, consider GI consult        Plan - 09/27/19 1703    Clinical Impression Statement  Pt presents with moderate oral phase dysphagia and continues to present with esophageal dysphagia. Pt with reduced labial closure, impaired lingual movement and mastication resulting in labial spillage with liquids, reduced bolus cohesiveness, premature spillage, and oral residuals. Pt with variable delay  in swallow initiation, however once triggered, Pt demonstrated good laryngeal vestibule closure and pharyngeal clearance. Pt with flash penetration of thins x1 with second teaspoon presentation of thin and trace aspiration of thins via straw sip when taken as liquid wash after regular textures- barium dipped just below vocal folds and was immediately removed (cough triggered). Of continued concern is esophageal dysphagia noted in MBSS completed about a month ago. Pt with reduced esophageal clearance of barium and retrograde movement, at times back into the pyriforms. This was cleared with a spontaneous second swallow. Pt was unable to propel the barium tablet over the base of tongue despite presentation in thin and puree (it had to be expectorated and removed with her finger). Pt with min oral/lingual residuals after the primary swallow which pooled in valleculae and were cleared with spontaneous secondary/dry swallow. Recommend D2 with trials of mechanical soft and thin liquids (ok for cup or straw) and consider GI consult (treating SLP to discuss any previous GI symptoms with family). Pt is at risk for post prandial aspiration due to pharyngoesophageal backflow into the pharynx.    Treatment/Interventions  Aspiration precaution  training;SLP instruction and feedback;Patient/family education       Patient will benefit from skilled therapeutic intervention in order to improve the following deficits and impairments:   Dysphagia, oropharyngeal phase    Recommendations/Treatment - 09/27/19 1701      Swallow Evaluation Recommendations   Recommended Consults  Consider GI evaluation;Consider esophageal assessment    SLP Diet Recommendations  Thin;Dysphagia 2 (chopped)    Liquid Administration via  Cup;Straw    Medication Administration  Crushed with puree    Supervision  Patient able to self feed;Full supervision/cueing for compensatory strategies    Compensations  Minimize environmental distractions;Lingual sweep for clearance of pocketing;Monitor for anterior loss;Multiple dry swallows after each bite/sip    Postural Changes  Seated upright at 90 degrees;Remain upright for at least 30 minutes after feeds/meals         Problem List Patient Active Problem List   Diagnosis Date Noted  . Hypercalcemia 09/27/2019  . Normochromic normocytic anemia 09/27/2019  . Diarrhea 09/27/2019  . Diplopia 09/27/2019  . Dysphagia due to recent cerebrovascular accident (CVA) 09/22/2019  . GERD without esophagitis 09/22/2019  . 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 Hypertension associated with type 2 diabetes mellitus (HCC) 09/22/2019  . Chronic constipation 09/22/2019  . Major depression, recurrent, chronic (HCC) 09/22/2019  . 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 Status post tracheostomy (HCC)   . Tongue swelling   . Palliative care by specialist   . Goals of care, counseling/discussion   . Full code status   . Acute respiratory failure with hypoxemia (HCC) 07/23/2019  . Hypokalemia 07/23/2019  . Hypophosphatemia 07/23/2019  . IVH (intraventricular hemorrhage) (HCC) 07/18/2019  . OBSTRUCTIVE SLEEP APNEA 06/27/2008    Thank you,  Havery Moros, CCC-SLP [phone]  [facility] 09/27/2019, 5:05 PM  [facility] [address] [place], [place], [address] Phone: [phone]   Fax:  [phone]  Name: Haley Hernandez MRN: [id number] Date of Birth: Feb 16, 1961

## 2019-09-27 NOTE — Assessment & Plan Note (Addendum)
Continue use of PEG tube.  PT/OT at SNF. Notify Jessica MICU NP of new onset subjective diplopia.

## 2019-09-27 NOTE — Patient Instructions (Signed)
See assessment and plan under each diagnosis in the problem list and acutely for this visit 

## 2019-09-27 NOTE — Assessment & Plan Note (Signed)
07/19/2019 A1c 5.7%.  Renal function is normal. Random glucoses are now in the low 100s.  While in [facility] and hospitalized glucoses were as high as 317 but that value was an outlier. Monitor closely for hypoglycemia with present insulin regimen.

## 2019-09-27 NOTE — Progress Notes (Signed)
NURSING HOME LOCATION:  [facility] ROOM NUMBER: [id number]  CODE STATUS: Full code  PCP: Betti Cruz, MD   This is a comprehensive admission note to [facility] performed on this date less than 30 days from date of admission. Included are preadmission medical/surgical history; reconciled medication list; family history; social history and comprehensive review of systems.  Corrections and additions to the records were documented. Comprehensive physical exam was also performed. Additionally a clinical summary was entered for each active diagnosis pertinent to this admission in the Problem List to enhance continuity of care.  HPI: She was admitted to the SNF on 09/21/2019 after prolonged hospitalization which included an admission to [facility] 4/28-5/19/2021. The admission was for right thalamic and basal ganglia hemorrhagic stroke on 07/18/2019.  At admission systolic blood pressure was greater than 250.  She was admitted to the neuro ICU.  Her protracted course necessitated tracheostomy which has subsequently been removed.  She also had a PEG tube which is in use.  The acute stroke is complicated by left hemiplegia.  She was admitted to this facility for short-term rehab with ultimate goal to return home independently if possible. At the SNF she is on Lovenox prophylaxis.  She is on a pured diet with nectar thick liquids.  Past medical and surgical history: Includes essential hypertension, diabetes with vascular complications, GERD, OSA and COPD.  Social history: Nondrinker, never smoked.  Family history: Reviewed   Review of systems: She has noted diplopia today.  She denies significant dysphagia. She complains of being very sleepy today.  She does have obstructive sleep apnea and states that she used her CPAP machine intermittently at home.  Therapy was working with her when I entered the room..  According to report maximal assist is needed for range of motion of  the left lower extremity.  With range of motion exercises in the left lower extremity she reported tingling.  They state that a mechanical lift is required for mobilization.  Constitutional: No fever, significant weight change  Eyes: No redness, discharge, pain, vision change ENT/mouth: No nasal congestion, purulent discharge, earache, change in hearing, sore throat  Cardiovascular: No chest pain, palpitations, paroxysmal nocturnal dyspnea, claudication, edema  Respiratory: No cough, sputum production, hemoptysis, DOE, significant snoring, apnea Gastrointestinal: No heartburn, dysphagia, abdominal pain, nausea /vomiting, rectal bleeding, melena Genitourinary: No dysuria, hematuria, pyuria, incontinence, nocturia Musculoskeletal: No joint stiffness, joint swelling, weakness, pain Dermatologic: No rash, pruritus, change in appearance of skin Neurologic: No dizziness, headache, syncope, seizures Psychiatric: No significant anxiety, depression, insomnia, anorexia Endocrine: No change in hair/skin/nails, excessive thirst, excessive hunger, excessive urination  Hematologic/lymphatic: No significant bruising, lymphadenopathy, abnormal bleeding Allergy/immunology: No itchy/watery eyes, significant sneezing, urticaria, angioedema  Physical exam:  Pertinent or positive findings: She is up in the wheelchair working with PT.  She is indeed somewhat lethargic.  She exhibits a blank stare.  Responses are slow.  She exhibits almost a baby talk type pattern.  There is intermittent slight exotropia on the left.  Extraocular motion testing is abnormal greater on the left.  There is marked inferior deviation compared to the right eye with inferior gaze.  She drools from the corner of the left mouth.  Localized exfoliation of the lower lip present.  Tracheostomy is well-healed.  Breath sounds are decreased.  Slight gallop cadence is suggested.  PEG is present.  Dorsalis pedis pulses are stronger than posterior tibial  pulses.  As noted there no voluntary contractures in the left lower  extremity.  The left upper extremity is braced.  General appearance: Adequately nourished; no acute distress, increased work of breathing is present.   Lymphatic: No lymphadenopathy about the head, neck, axilla. Eyes: No conjunctival inflammation or lid edema is present. There is no scleral icterus. Ears:  External ear exam shows no significant lesions or deformities.   Nose:  External nasal examination shows no deformity or inflammation. Nasal mucosa are pink and moist without lesions, exudates Oral exam: Lips and gums are healthy appearing.There is no oropharyngeal erythema or exudate. Neck:  No thyromegaly, masses, tenderness noted.    Heart:  No murmur, click, rub.  Lungs:  without wheezes, rhonchi, rales, rubs. Abdomen: Bowel sounds are normal.  Abdomen is soft and nontender with no organomegaly, hernias, masses. GU: Deferred  Extremities:  No cyanosis, clubbing, edema. Neurologic exam:  Balance, Rhomberg, finger to nose testing could not be completed due to clinical state Skin: Warm & dry w/o tenting. No significant lesions or rash.  See clinical summary under each active problem in the Problem List with associated updated therapeutic plan

## 2019-09-27 NOTE — Assessment & Plan Note (Addendum)
Calcium up to 10.6; current calcium is 10.4.  Evaluate for hyperparathyroidism if hypercalcemia progresses.

## 2019-09-27 NOTE — Assessment & Plan Note (Signed)
PEG tube in use.  Pured diet with nectar thick liquids.  Speech therapy to follow at SNF.

## 2019-09-29 ENCOUNTER — Encounter: Payer: Self-pay | Admitting: Adult Health

## 2019-09-29 ENCOUNTER — Non-Acute Institutional Stay (SKILLED_NURSING_FACILITY): Payer: 59 | Admitting: Adult Health

## 2019-09-29 DIAGNOSIS — I69391 Dysphagia following cerebral infarction: Secondary | ICD-10-CM | POA: Diagnosis not present

## 2019-09-29 DIAGNOSIS — I615 Nontraumatic intracerebral hemorrhage, intraventricular: Secondary | ICD-10-CM | POA: Diagnosis not present

## 2019-09-29 DIAGNOSIS — I69854 Hemiplegia and hemiparesis following other cerebrovascular disease affecting left non-dominant side: Secondary | ICD-10-CM | POA: Diagnosis not present

## 2019-09-29 DIAGNOSIS — R197 Diarrhea, unspecified: Secondary | ICD-10-CM

## 2019-09-29 NOTE — Progress Notes (Signed)
Location:    [facility] Room Number: 154/D Place of Service:  SNF (31)   CODE STATUS: Full Code  Allergies  Allergen Reactions  . Losartan Swelling  . Norvasc [Amlodipine] Swelling  . Ace Inhibitors     History of swelling with losartan  . Penicillins     Unable to verify reactions at this time/ Tolerated ceftriaxone March 2021    Chief Complaint  Patient presents with  . 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     HPI:  She is a 59 year old short term patient being seen for the management of her chronic illnesses: diarrhea; dysphagia; ivh. There are no reports of uncontrolled pain. She continues to have frequent diarrheal stools. No signs of aspiration present. Her po intake remains poor.   Past Medical History:  Diagnosis Date  . COPD 06/27/2008   Qualifier: Diagnosis of  By: Craige Cotta MD, Vineet    . Hypertension   . IVH (intraventricular hemorrhage) (HCC) 07/18/2019  . OBSTRUCTIVE SLEEP APNEA 06/27/2008   Qualifier: Diagnosis of  By: Craige Cotta MD, Vineet      Past Surgical History:  Procedure Laterality Date  . IR GASTROSTOMY TUBE MOD SED  08/03/2019    Social History   Socioeconomic History  . Marital status: Single    Spouse name: Not on file  . Number of children: Not on file  . Years of education: Not on file  . Highest education level: Not on file  Occupational History  . Not on file  Tobacco Use  . Smoking status: Never Smoker  . Smokeless tobacco: Never Used  Substance and Sexual Activity  . Alcohol use: Not Currently  . Drug use: Not on file  . Sexual activity: Not on file  Other Topics Concern  . Not on file  Social History Narrative  . Not on file   Social Determinants of Health    Financial Resource Strain:   . Difficulty of Paying Living Expenses:   Food Insecurity:   . Worried About Programme researcher, broadcasting/film/video in the Last Year:   . Barista in the Last Year:   Transportation Needs:   . Freight forwarder (Medical):   Marland Kitchen Lack of Transportation (Non-Medical):   Physical Activity:   . Days of Exercise per Week:   . Minutes of Exercise per Session:   Stress:   . Feeling of Stress :   Social Connections:   . Frequency of Communication with Friends and Family:   . Frequency of Social Gatherings with Friends and Family:   . Attends Religious Services:   . Active Member of Clubs or Organizations:   . Attends Banker Meetings:   Marland Kitchen Marital Status:   Intimate Partner Violence:   . Fear of Current or Ex-Partner:   . Emotionally Abused:   Marland Kitchen Physically Abused:   . Sexually Abused:    Family History  Problem Relation Age of Onset  . Hypertension Father   . Diabetes Father       VITAL SIGNS BP (!) 141/93   Pulse 93   Temp 97.9 F (36.6 C) (Oral)   Resp 20   Ht 5' (1.524  m)   Wt 148 lb 12.8 oz (67.5 kg)   BMI 29.06 kg/m   Outpatient Encounter Medications as of 09/29/2019  Medication Sig  . Acetaminophen 650 MG/20.3ML SOLN 650 mg by Gastric Tube route every 6 (six) hours as needed (AS NEEDED FOR PAIN).  Marland Kitchen albuterol (PROVENTIL) (2.5 MG/3ML) 0.083% nebulizer solution Take 3 mLs (2.5 mg total) by nebulization every 4 (four) hours as needed for wheezing.  . BUSPIRONE HCL PO 5 mg by Gastric Tube route every 12 (twelve) hours.  . cholestyramine (QUESTRAN) 4 GM/DOSE powder Take 4 g by mouth daily. Special Instructions: bulking for diarrhea  . enoxaparin (LOVENOX) 40 MG/0.4ML injection Inject 40 mg into the skin daily.  . famotidine (PEPCID) 20 MG tablet Place 1 tablet (20 mg total) into feeding tube 2 (two) times daily.  Marland Kitchen HYDRALAZINE HCL PO 100 mg by Gastric Tube route every 6 (six) hours.  . insulin aspart (NOVOLOG FLEXPEN) 100 UNIT/ML  FlexPen Inject 5 Units into the skin 3 (three) times daily with meals.  . insulin glargine (LANTUS) 100 UNIT/ML injection Inject 0.24 mLs (24 Units total) into the skin daily.  Marland Kitchen LABETALOL HCL PO 200 mg by Gastric Tube route every 8 (eight) hours.  Marland Kitchen loperamide (IMODIUM A-D) 2 MG tablet Place 2 mg into feeding tube every 12 (twelve) hours as needed for diarrhea or loose stools.  . NON FORMULARY Accu-check ac & qhs. Notify provider of results under 60 or over 400. Four Times A Day 06:30 AM, 11:30 AM, 04:30 PM, 09:00 PM  . NON FORMULARY Diet Change: Dys 2 (ground), thin liquids (extra gravy on the side)  . NON FORMULARY Flush g-tube with 240 ml free water TID. Three Times A Day 09:00 AM, 02:00 PM, 09:00 PM  . Nutritional Supplements (FEEDING SUPPLEMENT, GLUCERNA 1.2 CAL,) LIQD Place into feeding tube. Glucerna 1.2 by g-tube at 120 ml/hr-noctural feed from 2000 until 0600 daily. Document total intake qshift. Special Instructions: Keep HOB elevated at 45degrees during feeds/flushes. Every Shift Day, Evening, Nigh  . TRAZODONE HCL PO 50 mg by Gastric Tube route at bedtime as needed (As needed for inability to sleep x 2 weeks. Attempt and document a non-drug intervention and outcome prior to administering medication.).  . [DISCONTINUED] bisacodyl (DULCOLAX) 10 MG suppository Place 10 mg rectally daily.  . [DISCONTINUED] Nutritional Supplements (FEEDING SUPPLEMENT, OSMOLITE 1.2 CAL,) LIQD Place into feeding tube continuous. Osmolite 1.2 by g-tube at 24ml/hr continuous feed. Document total intake qshift. Special Instructions: Keep HOB elevated at 45degrees during feeds/flushes. Every Shift Day, Evening, Night  . [DISCONTINUED] polyethylene glycol (MIRALAX / GLYCOLAX) 17 g packet Place 17 g into feeding tube daily.   No facility-administered encounter medications on file as of 09/29/2019.     SIGNIFICANT DIAGNOSTIC EXAMS   PREVIOUS  07-16-19: ct of head:  3.2 x 2.9 x 4.2 cm acute parenchymal  hemorrhage with surrounding edema centered within the posterior right basal ganglia and right thalamus. Moderate volume intraventricular extension. Mass effect with partial effacement of the right lateral and third ventricles, and 4 mm leftward midline shift. Mild prominence of the lateral ventricles suspicious for early entrapment/hydrocephalus.  07-19-19: 2-d echo:  Left ventricular ejection fraction, by estimation, is 60 to 65%. The  left ventricle has hyperdynamic function. The left ventricle has no  regional wall motion abnormalities. There is severe concentric left  ventricular hypertrophy. Indeterminate  diastolic filling due to E-A fusion. Consider evalution for possible  cardiac amyloid given severe LV wall thickness and pericardial  effusion  with cMRI or PYP scan.   07-20-19: ct angio of head:  1. No arterial finding to explain the patient's thalamic hemorrhage. 2. Size stable thalamic hematoma and midline shift (7 mm). 3. Stable EVD positioning and ventriculomegaly.  08-08-19: ct of head:  Continued improvement in right thalamic hemorrhage. Small amount of ventricular hemorrhage with improvement. Mild ventricular enlargement unchanged. Mild midline shift to the left slightly improved.  08-08-19: EEG:  This study is suggestive of cortical dysfunction in right frontotemporal region likely secondary to underlying hemorrhage as well as moderate diffuse encephalopathy, nonspecific to etiology. No seizures or epileptiform discharges were seen throughout the recording.  NO NEW EXAMS.    LABS REVIEWED PREVIOUS   07-19-19: hgb a1c 5.7 07-20-19: chol 180;ldl 63; trig 362; ldl 45 09-22-19: wbc 7.3; hgb 10.7; hct 34.2; mcv 91.2 plt 229; glucose 121; bun 9 creat 0.41; k+ 4.0; na++ 142; ca 10.4; liver normal albumin 3.4   NO NEW LABS.    Review of Systems  Constitutional: Negative for malaise/fatigue.  Respiratory: Negative for cough and shortness of breath.   Cardiovascular: Negative for chest  pain, palpitations and leg swelling.  Gastrointestinal: Negative for abdominal pain, constipation and heartburn.  Musculoskeletal: Negative for back pain, joint pain and myalgias.  Skin: Negative.   Neurological: Negative for dizziness.  Psychiatric/Behavioral: The patient is not nervous/anxious.     Physical Exam Constitutional:      General: She is not in acute distress.    Appearance: She is well-developed. She is not diaphoretic.  Neck:     Thyroid: No thyromegaly.  Cardiovascular:     Rate and Rhythm: Normal rate and regular rhythm.     Pulses: Normal pulses.     Heart sounds: Normal heart sounds.  Pulmonary:     Effort: Pulmonary effort is normal. No respiratory distress.     Breath sounds: Normal breath sounds.  Abdominal:     General: Bowel sounds are normal. There is no distension.     Palpations: Abdomen is soft.     Tenderness: There is no abdominal tenderness.     Comments: Peg tube present   Musculoskeletal:     Cervical back: Neck supple.     Right lower leg: No edema.     Left lower leg: No edema.     Comments: Flaccid left hemiplegia    Lymphadenopathy:     Cervical: No cervical adenopathy.  Skin:    General: Skin is warm and dry.  Neurological:     Mental Status: She is alert and oriented to person, place, and time.  Psychiatric:        Mood and Affect: Mood normal.         ASSESSMENT/ PLAN:  TODAY  1. Diarrhea: is worse; will stop dulcolax supp and miralax will begin questran 4 gm daily for bulking.   2. 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 Will change tube feeding to 8p to 6a to allow her to get hungry.   3. 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 Acute  respiratory failure with hypoxemia: is stable will continue albuterol neb treatment every 4 hours as needed  5. GERD without esophagitis: is stable will continue pepcid 20 mg twice daily  6. Type 2 diabetes mellitus with neurological complications: is stable hgb a1c 5.7. will continue lantus 24 units daily novolog 5 units with meals.   7. Hypertension associated with type 2 diabetes mellitus: is stable b/p 131/74: will continue labetolol 200 mg every 8 hours; hydralazine 100 mg every 6 hours; will monitor  8. Major depression recurrent chronic: is stable will continue buspar 5 mg twice daily and is taking trazodone 50 mg nightly as needed through 10-04-19     MD is aware of resident's narcotic use and is in agreement with current plan of care. We will attempt to wean resident as appropriate.  Synthia Innocent NP [facility]  Contact [phone] Monday through Friday 8am- 5pm  After hours call [phone]

## 2019-10-05 ENCOUNTER — Ambulatory Visit (HOSPITAL_COMMUNITY): Payer: 59

## 2019-10-05 ENCOUNTER — Non-Acute Institutional Stay (SKILLED_NURSING_FACILITY): Payer: 59 | Admitting: Adult Health

## 2019-10-05 ENCOUNTER — Encounter: Payer: Self-pay | Admitting: Adult Health

## 2019-10-05 DIAGNOSIS — I69391 Dysphagia following cerebral infarction: Secondary | ICD-10-CM | POA: Diagnosis not present

## 2019-10-05 DIAGNOSIS — B370 Candidal stomatitis: Secondary | ICD-10-CM | POA: Diagnosis not present

## 2019-10-05 NOTE — Progress Notes (Signed)
Location:    [facility] Room Number: 154/D Place of Service:  SNF (31)   CODE STATUS: Full Code  Allergies  Allergen Reactions  . Losartan Swelling  . Norvasc [Amlodipine] Swelling  . Ace Inhibitors     History of swelling with losartan  . Penicillins     Unable to verify reactions at this time/ Tolerated ceftriaxone March 2021    Chief Complaint  Patient presents with  . Acute Visit    Speech Therapy Concerns    HPI:  Her appetite is poor. She states that she does not like the food; and that it is too salty. She is on thin liquids.  She is clearing her mouth; has a small amount of drool on the left side. She is wanting her tube out. She does verbalize understanding that she needs to eat and drink for the tube to come out. She does have oral thrush present. She denies any uncontrolled pain. No reports of fevers present.   Past Medical History:  Diagnosis Date  . COPD 06/27/2008   Qualifier: Diagnosis of  By: Craige Cotta MD, Vineet    . Hypertension   . IVH (intraventricular hemorrhage) (HCC) 07/18/2019  . OBSTRUCTIVE SLEEP APNEA 06/27/2008   Qualifier: Diagnosis of  By: Craige Cotta MD, Vineet      Past Surgical History:  Procedure Laterality Date  . IR GASTROSTOMY TUBE MOD SED  08/03/2019    Social History   Socioeconomic History  . Marital status: Single    Spouse name: Not on file  . Number of children: Not on file  . Years of education: Not on file  . Highest education level: Not on file  Occupational History  . Not on file  Tobacco Use  . Smoking status: Never Smoker  . Smokeless tobacco: Never Used  Substance and Sexual Activity  . Alcohol use: Not Currently  . Drug use: Not on file  . Sexual activity: Not on file  Other Topics Concern  . Not on file  Social History Narrative  . Not on file   Social Determinants of Health   Financial Resource Strain:   . Difficulty of Paying Living Expenses:   Food Insecurity:   . Worried About Community education officer in the Last Year:   . Barista in the Last Year:   Transportation Needs:   . Freight forwarder (Medical):   Marland Kitchen Lack of Transportation (Non-Medical):   Physical Activity:   . Days of Exercise per Week:   . Minutes of Exercise per Session:   Stress:   . Feeling of Stress :   Social Connections:   . Frequency of Communication with Friends and Family:   . Frequency of Social Gatherings with Friends and Family:   . Attends Religious Services:   . Active Member of Clubs or Organizations:   . Attends Banker Meetings:   Marland Kitchen Marital Status:   Intimate Partner Violence:   . Fear of Current or Ex-Partner:   . Emotionally Abused:   Marland Kitchen Physically Abused:   . Sexually Abused:    Family History  Problem Relation Age of Onset  . Hypertension Father   . Diabetes Father       VITAL SIGNS BP 128/74   Pulse 86   Temp (!) 97.2 F (36.2 C) (Oral)   Resp 20   Ht 5' (1.524 m)   Wt 151 lb 14.4 oz (68.9 kg)   BMI 29.67 kg/m  Outpatient Encounter Medications as of 10/05/2019  Medication Sig  . Acetaminophen 650 MG/20.3ML SOLN 650 mg by Gastric Tube route every 6 (six) hours as needed (AS NEEDED FOR PAIN).  Marland Kitchen albuterol (PROVENTIL) (2.5 MG/3ML) 0.083% nebulizer solution Take 3 mLs (2.5 mg total) by nebulization every 4 (four) hours as needed for wheezing.  . BUSPIRONE HCL PO Take 5 mg by mouth every 12 (twelve) hours.   . cholestyramine (QUESTRAN) 4 GM/DOSE powder Take 4 g by mouth daily. Special Instructions: bulking for diarrhea  . enoxaparin (LOVENOX) 40 MG/0.4ML injection Inject 40 mg into the skin daily.  . famotidine (PEPCID) 20 MG tablet Place 1 tablet (20 mg total) into feeding tube 2 (two) times daily.  Marland Kitchen HYDRALAZINE HCL PO Take 100 mg by mouth every 6 (six) hours.   . insulin aspart (NOVOLOG FLEXPEN) 100 UNIT/ML FlexPen Inject 5 Units into the skin 3 (three) times daily with meals.  . insulin glargine (LANTUS) 100 UNIT/ML injection Inject 0.24 mLs (24  Units total) into the skin daily.  Marland Kitchen LABETALOL HCL PO Take 200 mg by mouth every 8 (eight) hours.   Marland Kitchen loperamide (IMODIUM A-D) 2 MG tablet Take 2 mg by mouth every 12 (twelve) hours as needed for diarrhea or loose stools.   . NON FORMULARY Accu-check ac & qhs. Notify provider of results under 60 or over 400. Four Times A Day 06:30 AM, 11:30 AM, 04:30 PM, 09:00 PM  . NON FORMULARY Diet Change: Dys 2 (ground), thin liquids (extra gravy on the side)  . NON FORMULARY Flush g-tube with 240 ml free water TID. Three Times A Day 09:00 AM, 02:00 PM, 09:00 PM  . Nutritional Supplements (FEEDING SUPPLEMENT, GLUCERNA 1.2 CAL,) LIQD Place into feeding tube. Glucerna 1.2 by g-tube at 120 ml/hr-noctural feed from 2000 until 0600 daily. Document total intake qshift. Special Instructions: Keep HOB elevated at 45degrees during feeds/flushes. Every Shift Day, Evening, Nigh  . [DISCONTINUED] TRAZODONE HCL PO 50 mg by Gastric Tube route at bedtime as needed (As needed for inability to sleep x 2 weeks. Attempt and document a non-drug intervention and outcome prior to administering medication.).   No facility-administered encounter medications on file as of 10/05/2019.     SIGNIFICANT DIAGNOSTIC EXAMS   PREVIOUS  07-16-19: ct of head:  3.2 x 2.9 x 4.2 cm acute parenchymal hemorrhage with surrounding edema centered within the posterior right basal ganglia and right thalamus. Moderate volume intraventricular extension. Mass effect with partial effacement of the right lateral and third ventricles, and 4 mm leftward midline shift. Mild prominence of the lateral ventricles suspicious for early entrapment/hydrocephalus.  07-19-19: 2-d echo:  Left ventricular ejection fraction, by estimation, is 60 to 65%. The  left ventricle has hyperdynamic function. The left ventricle has no  regional wall motion abnormalities. There is severe concentric left  ventricular hypertrophy. Indeterminate  diastolic filling due to E-A  fusion. Consider evalution for possible  cardiac amyloid given severe LV wall thickness and pericardial effusion  with cMRI or PYP scan.   07-20-19: ct angio of head:  1. No arterial finding to explain the patient's thalamic hemorrhage. 2. Size stable thalamic hematoma and midline shift (7 mm). 3. Stable EVD positioning and ventriculomegaly.  08-08-19: ct of head:  Continued improvement in right thalamic hemorrhage. Small amount of ventricular hemorrhage with improvement. Mild ventricular enlargement unchanged. Mild midline shift to the left slightly improved.  08-08-19: EEG:  This study is suggestive of cortical dysfunction in right frontotemporal region likely secondary  to underlying hemorrhage as well as moderate diffuse encephalopathy, nonspecific to etiology. No seizures or epileptiform discharges were seen throughout the recording.  NO NEW EXAMS.    LABS REVIEWED PREVIOUS   07-19-19: hgb a1c 5.7 07-20-19: chol 180;ldl 63; trig 362; ldl 45 09-22-19: wbc 7.3; hgb 10.7; hct 34.2; mcv 91.2 plt 229; glucose 121; bun 9 creat 0.41; k+ 4.0; na++ 142; ca 10.4; liver normal albumin 3.4   NO NEW LABS.    Review of Systems  Constitutional: Negative for malaise/fatigue.  Respiratory: Negative for cough and shortness of breath.   Cardiovascular: Negative for chest pain, palpitations and leg swelling.  Gastrointestinal: Negative for abdominal pain, constipation and heartburn.  Musculoskeletal: Negative for back pain, joint pain and myalgias.  Skin: Negative.   Neurological: Negative for dizziness.  Psychiatric/Behavioral: The patient is not nervous/anxious.     Physical Exam Constitutional:      General: She is not in acute distress.    Appearance: She is well-developed. She is not diaphoretic.  Neck:     Thyroid: No thyromegaly.  Cardiovascular:     Rate and Rhythm: Normal rate and regular rhythm.     Pulses: Normal pulses.     Heart sounds: Normal heart sounds.  Pulmonary:     Effort:  Pulmonary effort is normal. No respiratory distress.     Breath sounds: Normal breath sounds.  Abdominal:     General: Bowel sounds are normal. There is no distension.     Palpations: Abdomen is soft.     Tenderness: There is no abdominal tenderness.     Comments: Peg tube present   Musculoskeletal:     Cervical back: Neck supple.     Right lower leg: No edema.     Left lower leg: No edema.     Comments: Flaccid left hemiplegia   Lymphadenopathy:     Cervical: No cervical adenopathy.  Skin:    General: Skin is warm and dry.  Neurological:     Mental Status: She is alert and oriented to person, place, and time.  Psychiatric:        Mood and Affect: Mood normal.        ASSESSMENT/ PLAN:  TODAY  1. 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 Oral thrush: is worse: will begin nystatin 5 cc four time daily through 10-12-19.     MD is aware of resident's narcotic use and is in agreement with current plan of care. We will attempt to wean resident as appropriate.  Ok Edwards NP [facility]  Contact [phone] Monday through Friday 8am- 5pm  After hours call [phone]

## 2019-10-06 ENCOUNTER — Encounter: Payer: Self-pay | Admitting: Adult Health

## 2019-10-06 ENCOUNTER — Non-Acute Institutional Stay (SKILLED_NURSING_FACILITY): Payer: 59 | Admitting: Adult Health

## 2019-10-06 DIAGNOSIS — K219 Gastro-esophageal reflux disease without esophagitis: Secondary | ICD-10-CM

## 2019-10-06 DIAGNOSIS — I10 Essential (primary) hypertension: Secondary | ICD-10-CM | POA: Diagnosis not present

## 2019-10-06 DIAGNOSIS — E1159 Type 2 diabetes mellitus with other circulatory complications: Secondary | ICD-10-CM | POA: Diagnosis not present

## 2019-10-06 DIAGNOSIS — E1149 Type 2 diabetes mellitus with other diabetic neurological complication: Secondary | ICD-10-CM | POA: Diagnosis not present

## 2019-10-06 NOTE — Progress Notes (Signed)
Location:    [facility] Room Number: 154/D Place of Service:  SNF (31)   CODE STATUS: Full Code  Allergies  Allergen Reactions  . Losartan Swelling  . Norvasc [Amlodipine] Swelling  . Ace Inhibitors     History of swelling with losartan  . Penicillins     Unable to verify reactions at this time/ Tolerated ceftriaxone March 2021    Chief Complaint  Patient presents with  . Acute Visit    Care Plan Meeting  . Short Term Rehab    Routine Visit    HPI:  We have come together for her care plan meeting. Family present.  BIMS 7/15 mood 8/30. She has not had any falls. She continues to have a poor appetite and requires encouragement to eat. She has no movement of her left lower extremity. Her goal is to return back home. Her family is working on a ramp for her. She will need a great deal of dme including a hospital bed; wheelchair; lift; tube feeding supplies. She will continue to be followed for her chronic illnesses including: GERD without esophagitis:   Type 2 diabetes mellitus with neurological complications; Hypertension associated with type 2 diabetes mellitus:  Past Medical History:  Diagnosis Date  . COPD 06/27/2008   Qualifier: Diagnosis of  By: Craige Cotta MD, Vineet    . Hypertension   . IVH (intraventricular hemorrhage) (HCC) 07/18/2019  . OBSTRUCTIVE SLEEP APNEA 06/27/2008   Qualifier: Diagnosis of  By: Craige Cotta MD, Vineet      Past Surgical History:  Procedure Laterality Date  . IR GASTROSTOMY TUBE MOD SED  08/03/2019    Social History   Socioeconomic History  . Marital status: Single    Spouse name: Not on file  . Number of children: Not on file  . Years of education: Not on file  . Highest education level: Not on file  Occupational History  . Not on file  Tobacco Use  . Smoking status: Never Smoker  . Smokeless tobacco: Never Used  Substance and Sexual Activity  . Alcohol use: Not Currently  . Drug use: Not on file  . Sexual activity: Not  on file  Other Topics Concern  . Not on file  Social History Narrative  . Not on file   Social Determinants of Health   Financial Resource Strain:   . Difficulty of Paying Living Expenses:   Food Insecurity:   . Worried About Programme researcher, broadcasting/film/video in the Last Year:   . Barista in the Last Year:   Transportation Needs:   . Freight forwarder (Medical):   Marland Kitchen Lack of Transportation (Non-Medical):   Physical Activity:   . Days of Exercise per Week:   . Minutes of Exercise per Session:   Stress:   . Feeling of Stress :   Social Connections:   . Frequency of Communication with Friends and Family:   . Frequency of Social Gatherings with Friends and Family:   . Attends Religious Services:   . Active Member of Clubs or Organizations:   . Attends Banker Meetings:   Marland Kitchen Marital Status:   Intimate Partner Violence:   . Fear of Current or Ex-Partner:   . Emotionally Abused:   Marland Kitchen Physically Abused:   . Sexually Abused:    Family History  Problem Relation Age of Onset  . Hypertension Father   . Diabetes Father       VITAL SIGNS BP 112/61  Pulse 61   Temp (!) 97 F (36.1 C) (Oral)   Resp 16   Ht 5' (1.524 m)   Wt 151 lb 9.6 oz (68.8 kg)   BMI 29.61 kg/m   Outpatient Encounter Medications as of 10/06/2019  Medication Sig  . Acetaminophen 650 MG/20.3ML SOLN Take 650 mg by mouth every 6 (six) hours as needed (AS NEEDED FOR PAIN).   Marland Kitchen albuterol (PROVENTIL) (2.5 MG/3ML) 0.083% nebulizer solution Take 3 mLs (2.5 mg total) by nebulization every 4 (four) hours as needed for wheezing.  . BUSPIRONE HCL PO Take 5 mg by mouth every 12 (twelve) hours.   . cholestyramine (QUESTRAN) 4 GM/DOSE powder Take 4 g by mouth daily. Special Instructions: bulking for diarrhea  . enoxaparin (LOVENOX) 40 MG/0.4ML injection Inject 40 mg into the skin daily.  . famotidine (PEPCID) 20 MG tablet Place 1 tablet (20 mg total) into feeding tube 2 (two) times daily.  Marland Kitchen HYDRALAZINE HCL PO  Take 100 mg by mouth every 6 (six) hours.   . insulin aspart (NOVOLOG FLEXPEN) 100 UNIT/ML FlexPen Inject 5 Units into the skin 3 (three) times daily with meals.  . insulin glargine (LANTUS) 100 UNIT/ML injection Inject 0.24 mLs (24 Units total) into the skin daily.  Marland Kitchen LABETALOL HCL PO Take 200 mg by mouth every 8 (eight) hours.   Marland Kitchen loperamide (IMODIUM A-D) 2 MG tablet Take 2 mg by mouth every 12 (twelve) hours as needed for diarrhea or loose stools.   . NON FORMULARY Accu-check ac & qhs. Notify provider of results under 60 or over 400. Four Times A Day 06:30 AM, 11:30 AM, 04:30 PM, 09:00 PM  . NON FORMULARY Diet Change: Dys 2 (ground), thin liquids (extra gravy on the side)  . NON FORMULARY Flush g-tube with 240 ml free water TID. Three Times A Day 09:00 AM, 02:00 PM, 09:00 PM  . Nutritional Supplements (FEEDING SUPPLEMENT, GLUCERNA 1.2 CAL,) LIQD Place into feeding tube. Glucerna 1.2 by g-tube at 120 ml/hr-noctural feed from 2000 until 0600 daily. Document total intake qshift. Special Instructions: Keep HOB elevated at 45degrees during feeds/flushes. Every Shift Day, Evening, Nigh  . nystatin (MYCOSTATIN) 100000 UNIT/ML suspension Take 5 mLs by mouth 4 (four) times daily. : for oral thrush clean tongue with oral swab   No facility-administered encounter medications on file as of 10/06/2019.     SIGNIFICANT DIAGNOSTIC EXAMS   PREVIOUS  07-16-19: ct of head:  3.2 x 2.9 x 4.2 cm acute parenchymal hemorrhage with surrounding edema centered within the posterior right basal ganglia and right thalamus. Moderate volume intraventricular extension. Mass effect with partial effacement of the right lateral and third ventricles, and 4 mm leftward midline shift. Mild prominence of the lateral ventricles suspicious for early entrapment/hydrocephalus.  07-19-19: 2-d echo:  Left ventricular ejection fraction, by estimation, is 60 to 65%. The  left ventricle has hyperdynamic function. The left ventricle  has no  regional wall motion abnormalities. There is severe concentric left  ventricular hypertrophy. Indeterminate  diastolic filling due to E-A fusion. Consider evalution for possible  cardiac amyloid given severe LV wall thickness and pericardial effusion  with cMRI or PYP scan.   07-20-19: ct angio of head:  1. No arterial finding to explain the patient's thalamic hemorrhage. 2. Size stable thalamic hematoma and midline shift (7 mm). 3. Stable EVD positioning and ventriculomegaly.  08-08-19: ct of head:  Continued improvement in right thalamic hemorrhage. Small amount of ventricular hemorrhage with improvement. Mild ventricular enlargement unchanged.  Mild midline shift to the left slightly improved.  08-08-19: EEG:  This study is suggestive of cortical dysfunction in right frontotemporal region likely secondary to underlying hemorrhage as well as moderate diffuse encephalopathy, nonspecific to etiology. No seizures or epileptiform discharges were seen throughout the recording.  NO NEW EXAMS.    LABS REVIEWED PREVIOUS   07-19-19: hgb a1c 5.7 07-20-19: chol 180;ldl 63; trig 362; ldl 45 09-22-19: wbc 7.3; hgb 10.7; hct 34.2; mcv 91.2 plt 229; glucose 121; bun 9 creat 0.41; k+ 4.0; na++ 142; ca 10.4; liver normal albumin 3.4   NO NEW LABS.   Review of Systems  Constitutional: Negative for malaise/fatigue.  Respiratory: Negative for cough and shortness of breath.   Cardiovascular: Negative for chest pain, palpitations and leg swelling.  Gastrointestinal: Negative for abdominal pain, constipation and heartburn.  Musculoskeletal: Negative for back pain, joint pain and myalgias.  Skin: Negative.   Neurological: Negative for dizziness.  Psychiatric/Behavioral: The patient is not nervous/anxious.     Physical Exam Constitutional:      General: She is not in acute distress.    Appearance: She is well-developed. She is not diaphoretic.  Neck:     Thyroid: No thyromegaly.  Cardiovascular:      Rate and Rhythm: Normal rate and regular rhythm.     Pulses: Normal pulses.     Heart sounds: Normal heart sounds.  Pulmonary:     Effort: Pulmonary effort is normal. No respiratory distress.     Breath sounds: Normal breath sounds.  Abdominal:     General: Bowel sounds are normal. There is no distension.     Palpations: Abdomen is soft.     Tenderness: There is no abdominal tenderness.     Comments: Peg tube present   Musculoskeletal:     Cervical back: Neck supple.     Right lower leg: No edema.     Left lower leg: No edema.     Comments: Left flaccid hemiplegia   Lymphadenopathy:     Cervical: No cervical adenopathy.  Skin:    General: Skin is warm and dry.  Neurological:     Mental Status: She is alert. Mental status is at baseline.  Psychiatric:        Mood and Affect: Mood normal.        ASSESSMENT/ PLAN:  TODAY  1. GERD without esophagitis: is stable will continue pepcid 20 mg twice daily   2. Type 2 diabetes mellitus with neurological complications; is stable hgb a1c 5.6 will continue lantus 24 units daily and novolog 5 units with meals.   3. Hypertension associated with type 2 diabetes mellitus: is stable b/p 112/61 will continue labetolol 200 mg every 8 hours hydralazine 100 mg every 6 hours.    PREVIOUS  4. Acute respiratory failure with hypoxemia: is stable will continue albuterol neb treatment every 4 hours as needed  5. Major depression recurrent chronic: is stable will continue buspar 5 mg twice daily   6. Diarrhea: is improving ; will continue  questran 4 gm daily for bulking.   7. 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 Will continue tube feeding  8p to 6a to allow her to get hungry.   8. 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 We will attempt to wean resident as appropriate.  Orilla Templeman NP [facility]  Contact [phone] Monday through Friday 8am- 5pm  After hours call [phone]   

## 2019-10-10 ENCOUNTER — Inpatient Hospital Stay: Payer: MEDICAID | Admitting: Adult Health

## 2019-10-10 DIAGNOSIS — B370 Candidal stomatitis: Secondary | ICD-10-CM | POA: Insufficient documentation

## 2019-10-11 ENCOUNTER — Non-Acute Institutional Stay (SKILLED_NURSING_FACILITY): Payer: 59 | Admitting: Adult Health

## 2019-10-11 ENCOUNTER — Encounter: Payer: Self-pay | Admitting: Adult Health

## 2019-10-11 DIAGNOSIS — Z930 Tracheostomy status: Secondary | ICD-10-CM | POA: Diagnosis not present

## 2019-10-11 DIAGNOSIS — I69854 Hemiplegia and hemiparesis following other cerebrovascular disease affecting left non-dominant side: Secondary | ICD-10-CM

## 2019-10-11 DIAGNOSIS — I69391 Dysphagia following cerebral infarction: Secondary | ICD-10-CM | POA: Diagnosis not present

## 2019-10-11 DIAGNOSIS — I615 Nontraumatic intracerebral hemorrhage, intraventricular: Secondary | ICD-10-CM

## 2019-10-11 NOTE — Progress Notes (Signed)
Location:  [facility] Room Number: 129-P Place of Service:  SNF (31)    CODE STATUS:  FULL CODE  Allergies  Allergen Reactions  . Losartan Swelling  . Norvasc [Amlodipine] Swelling  . Ace Inhibitors     History of swelling with losartan  . Penicillins     Unable to verify reactions at this time/ Tolerated ceftriaxone March 2021    Chief Complaint  Patient presents with  . Discharge Note    Patient is seen for discharge from SNF    HPI:  Over the next week she is being discharged to home with home health for rn/cna/pt/ot. She will need the following dme: hospital bed; hoyer life; wheelchair bedside commode; tube feeding pump and supplies. She being taken home to her family. She had a cva with a prolonged hospitalization. She had been admitted to this facility for short term rehab. Her family feels as though her needs can be better met in the home environment.    Past Medical History:  Diagnosis Date  . COPD 06/27/2008   Qualifier: Diagnosis of  By: Halford Chessman MD, Vineet    . Hypertension   . IVH (intraventricular hemorrhage) ([place]) 07/18/2019  . OBSTRUCTIVE SLEEP APNEA 06/27/2008   Qualifier: Diagnosis of  By: Halford Chessman MD, Vineet      Past Surgical History:  Procedure Laterality Date  . IR GASTROSTOMY TUBE MOD SED  08/03/2019    Social History   Socioeconomic History  . Marital status: Single    Spouse name: Not on file  . Number of children: Not on file  . Years of education: Not on file  . Highest education level: Not on file  Occupational History  . Not on file  Tobacco Use  . Smoking status: Never Smoker  . Smokeless tobacco: Never Used  Substance and Sexual Activity  . Alcohol use: Not Currently  . Drug use: Not on file  . Sexual activity: Not on file  Other Topics Concern  . Not on file  Social History Narrative  . Not on file   Social Determinants of Health   Financial Resource Strain:   . Difficulty of Paying Living Expenses:    Food Insecurity:   . Worried About Charity fundraiser in the Last Year:   . Arboriculturist in the Last Year:   Transportation Needs:   . Film/video editor (Medical):   Marland Kitchen Lack of Transportation (Non-Medical):   Physical Activity:   . Days of Exercise per Week:   . Minutes of Exercise per Session:   Stress:   . Feeling of Stress :   Social Connections:   . Frequency of Communication with Friends and Family:   . Frequency of Social Gatherings with Friends and Family:   . Attends Religious Services:   . Active Member of Clubs or Organizations:   . Attends Archivist Meetings:   Marland Kitchen Marital Status:   Intimate Partner Violence:   . Fear of Current or Ex-Partner:   . Emotionally Abused:   Marland Kitchen Physically Abused:   . Sexually Abused:    Family History  Problem Relation Age of Onset  . Hypertension Father   . Diabetes Father     VITAL SIGNS BP (!) 143/91   Pulse 81   Temp 98.6 F (37 C) (Oral)   Resp 20   Ht 5' (1.524 m)   Wt 154 lb 1.6 oz (69.9 kg)   BMI 30.10 kg/m  Patient's Medications  New Prescriptions   No medications on file  Previous Medications   ACETAMINOPHEN 650 MG/20.3ML SOLN    Take 650 mg by mouth every 6 (six) hours as needed (AS NEEDED FOR PAIN).    ALBUTEROL (PROVENTIL) (2.5 MG/3ML) 0.083% NEBULIZER SOLUTION    Take 3 mLs (2.5 mg total) by nebulization every 4 (four) hours as needed for wheezing.   BUSPIRONE (BUSPAR) 5 MG TABLET    Take 5 mg by mouth every 12 (twelve) hours.   CHOLESTYRAMINE (QUESTRAN) 4 GM/DOSE POWDER    Take 4 g by mouth daily. Special Instructions: bulking for diarrhea   ENOXAPARIN (LOVENOX) 40 MG/0.4ML INJECTION    Inject 40 mg into the skin daily.   FAMOTIDINE (PEPCID) 20 MG TABLET    Place 1 tablet (20 mg total) into feeding tube 2 (two) times daily.   HYDRALAZINE (APRESOLINE) 100 MG TABLET    Take 100 mg by mouth every 6 (six) hours.   INSULIN ASPART (NOVOLOG FLEXPEN) 100 UNIT/ML FLEXPEN    Inject 5 Units into the skin  3 (three) times daily with meals.   INSULIN GLARGINE (LANTUS) 100 UNIT/ML INJECTION    Inject 0.24 mLs (24 Units total) into the skin daily.   LABETALOL (NORMODYNE) 200 MG TABLET    Take 200 mg by mouth every 8 (eight) hours.   LOPERAMIDE (IMODIUM A-D) 2 MG TABLET    Take 2 mg by mouth every 12 (twelve) hours as needed for diarrhea or loose stools.    NON FORMULARY    Accu-check ac & qhs. Notify provider of results under 60 or over 400. Four Times A Day 06:30 AM, 11:30 AM, 04:30 PM, 09:00 PM   NON FORMULARY    Diet Change: Dys 2 (ground), thin liquids (extra gravy on the side)   NON FORMULARY    Flush g-tube with 240 ml free water TID. Three Times A Day 09:00 AM, 02:00 PM, 09:00 PM   NUTRITIONAL SUPPLEMENTS (FEEDING SUPPLEMENT, GLUCERNA 1.2 CAL,) LIQD    Place into feeding tube. Glucerna 1.2 by g-tube at 120 ml/hr-noctural feed from 2000 until 0600 daily. Document total intake qshift. Special Instructions: Keep HOB elevated at 45degrees during feeds/flushes. Every Shift Day, Evening, Nigh   NYSTATIN (MYCOSTATIN) 100000 UNIT/ML SUSPENSION    Take 5 mLs by mouth 4 (four) times daily. : for oral thrush clean tongue with oral swab  Modified Medications   No medications on file  Discontinued Medications   BUSPIRONE HCL PO    Take 5 mg by mouth every 12 (twelve) hours.    HYDRALAZINE HCL PO    Take 100 mg by mouth every 6 (six) hours.    LABETALOL HCL PO    Take 200 mg by mouth every 8 (eight) hours.      SIGNIFICANT DIAGNOSTIC EXAMS   PREVIOUS  07-16-19: ct of head:  3.2 x 2.9 x 4.2 cm acute parenchymal hemorrhage with surrounding edema centered within the posterior right basal ganglia and right thalamus. Moderate volume intraventricular extension. Mass effect with partial effacement of the right lateral and third ventricles, and 4 mm leftward midline shift. Mild prominence of the lateral ventricles suspicious for early entrapment/hydrocephalus.  07-19-19: 2-d echo:  Left ventricular  ejection fraction, by estimation, is 60 to 65%. The  left ventricle has hyperdynamic function. The left ventricle has no  regional wall motion abnormalities. There is severe concentric left  ventricular hypertrophy. Indeterminate  diastolic filling due to E-A fusion. Consider evalution for possible  cardiac amyloid given severe LV wall thickness and pericardial effusion  with cMRI or PYP scan.   07-20-19: ct angio of head:  1. No arterial finding to explain the patient's thalamic hemorrhage. 2. Size stable thalamic hematoma and midline shift (7 mm). 3. Stable EVD positioning and ventriculomegaly.  08-08-19: ct of head:  Continued improvement in right thalamic hemorrhage. Small amount of ventricular hemorrhage with improvement. Mild ventricular enlargement unchanged. Mild midline shift to the left slightly improved.  08-08-19: EEG:  This study is suggestive of cortical dysfunction in right frontotemporal region likely secondary to underlying hemorrhage as well as moderate diffuse encephalopathy, nonspecific to etiology. No seizures or epileptiform discharges were seen throughout the recording.  NO NEW EXAMS.    LABS REVIEWED PREVIOUS   07-19-19: hgb a1c 5.7 07-20-19: chol 180;ldl 63; trig 362; ldl 45 09-22-19: wbc 7.3; hgb 10.7; hct 34.2; mcv 91.2 plt 229; glucose 121; bun 9 creat 0.41; k+ 4.0; na++ 142; ca 10.4; liver normal albumin 3.4   NO NEW LABS.   Review of Systems  Constitutional: Negative for malaise/fatigue.  Respiratory: Negative for cough and shortness of breath.   Cardiovascular: Negative for chest pain, palpitations and leg swelling.  Gastrointestinal: Negative for abdominal pain, constipation and heartburn.  Musculoskeletal: Negative for back pain, joint pain and myalgias.  Skin: Negative.   Neurological: Negative for dizziness.  Psychiatric/Behavioral: The patient is not nervous/anxious.      Physical Exam Constitutional:      General: She is not in acute distress.     Appearance: She is well-developed. She is not diaphoretic.  Neck:     Thyroid: No thyromegaly.  Cardiovascular:     Rate and Rhythm: Normal rate and regular rhythm.     Pulses: Normal pulses.     Heart sounds: Normal heart sounds.  Pulmonary:     Effort: Pulmonary effort is normal. No respiratory distress.     Breath sounds: Normal breath sounds.  Abdominal:     General: Bowel sounds are normal. There is no distension.     Palpations: Abdomen is soft.     Tenderness: There is no abdominal tenderness.     Comments: Peg tube present   Musculoskeletal:     Cervical back: Neck supple.     Right lower leg: No edema.     Left lower leg: No edema.     Comments: Left flaccid hemiplegia    Lymphadenopathy:     Cervical: No cervical adenopathy.  Skin:    General: Skin is warm and dry.  Neurological:     Mental Status: She is alert. Mental status is at baseline.  Psychiatric:        Mood and Affect: Mood normal.      ASSESSMENT/ PLAN:  Patient is being discharged with the following home health services:  Pt/ot/rn/cna: to evaluate and treat as indicated for gait balance strength adl training; medication and tube feeding management adl care.   Patient is being discharged with the following durable medical equipment:  Bed side commode  She requires a semi-electric hospital bed: she suffers from a CVA and is unable to move on her own. She requires frequent and immediate changes in position that cannot be achieved in a normal bed She will require a hoyer life to allow her easy transfers to and from hospital bed.  Standard wheelchair with elevated leg rests; cushion; anti-tippers brake extensions to allow her to maintain her current level of independence with her adls which cannot be  achieved with a walker; cane crutches she is able to propel her self in the wheelchair.    Patient has been advised to f/u with their PCP in 1-2 weeks to bring them up to date on their rehab stay.  Social  services at facility was responsible for arranging this appointment.  Pt was provided with a 30 day supply of prescriptions for medications and refills must be obtained from their PCP.  For controlled substances, a more limited supply may be provided adequate until PCP appointment only.   A 30 day [facility] [address]  Time spent with patient: 45 minutes: dme; medications home health.    Ok Edwards NP [facility]  Contact [phone] Monday through Friday 8am- 5pm  After hours call [phone]

## 2019-10-19 ENCOUNTER — Other Ambulatory Visit: Payer: Self-pay | Admitting: Adult Health

## 2019-10-19 ENCOUNTER — Telehealth: Payer: Self-pay

## 2019-10-19 MED ORDER — CHOLESTYRAMINE 4 GM/DOSE PO POWD: 4.0000 g | Freq: Every day | ORAL | 0 refills | Status: DC

## 2019-10-19 MED ORDER — NOVOLOG FLEXPEN 100 UNIT/ML ~~LOC~~ SOPN: 5.0000 [IU] | PEN_INJECTOR | Freq: Three times a day (TID) | SUBCUTANEOUS | 0 refills | Status: DC

## 2019-10-19 MED ORDER — BUSPIRONE HCL 5 MG PO TABS: 5.0000 mg | ORAL_TABLET | Freq: Two times a day (BID) | ORAL | 0 refills | Status: DC

## 2019-10-19 MED ORDER — INSULIN GLARGINE 100 UNIT/ML ~~LOC~~ SOLN: 24.0000 [IU] | Freq: Every day | SUBCUTANEOUS | 0 refills | Status: DC

## 2019-10-19 MED ORDER — HYDRALAZINE HCL 100 MG PO TABS: 100.0000 mg | ORAL_TABLET | Freq: Four times a day (QID) | ORAL | 0 refills | Status: DC

## 2019-10-19 MED ORDER — LABETALOL HCL 200 MG PO TABS: 200.0000 mg | ORAL_TABLET | Freq: Three times a day (TID) | ORAL | 0 refills | Status: DC

## 2019-10-19 MED ORDER — ENOXAPARIN SODIUM 40 MG/0.4ML ~~LOC~~ SOLN: 40.0000 mg | Freq: Every day | SUBCUTANEOUS | 0 refills | Status: DC

## 2019-10-19 NOTE — Telephone Encounter (Signed)
Joy from [facility] called needing prescriptions for patient's syringes, pen needles and glucose testing supplies. Pt. Is no longer under our care. Called pharmacy back and let them know Jacki Cones answered and stated that they already received what they needed.

## 2019-10-20 ENCOUNTER — Encounter: Payer: Self-pay | Admitting: Adult Health

## 2019-10-20 ENCOUNTER — Ambulatory Visit (INDEPENDENT_AMBULATORY_CARE_PROVIDER_SITE_OTHER): Payer: 59 | Admitting: Adult Health

## 2019-10-20 VITALS — BP 146/82 | HR 84 | Ht 61.0 in | Wt 140.0 lb

## 2019-10-20 DIAGNOSIS — I612 Nontraumatic intracerebral hemorrhage in hemisphere, unspecified: Secondary | ICD-10-CM | POA: Diagnosis not present

## 2019-10-20 DIAGNOSIS — I69354 Hemiplegia and hemiparesis following cerebral infarction affecting left non-dominant side: Secondary | ICD-10-CM

## 2019-10-20 DIAGNOSIS — I10 Essential (primary) hypertension: Secondary | ICD-10-CM

## 2019-10-20 DIAGNOSIS — G8194 Hemiplegia, unspecified affecting left nondominant side: Secondary | ICD-10-CM

## 2019-10-20 DIAGNOSIS — I69391 Dysphagia following cerebral infarction: Secondary | ICD-10-CM

## 2019-10-20 DIAGNOSIS — R739 Hyperglycemia, unspecified: Secondary | ICD-10-CM

## 2019-10-20 DIAGNOSIS — G4733 Obstructive sleep apnea (adult) (pediatric): Secondary | ICD-10-CM

## 2019-10-20 DIAGNOSIS — Z431 Encounter for attention to gastrostomy: Secondary | ICD-10-CM

## 2019-10-20 DIAGNOSIS — Z8701 Personal history of pneumonia (recurrent): Secondary | ICD-10-CM

## 2019-10-20 DIAGNOSIS — K219 Gastro-esophageal reflux disease without esophagitis: Secondary | ICD-10-CM

## 2019-10-20 DIAGNOSIS — E1159 Type 2 diabetes mellitus with other circulatory complications: Secondary | ICD-10-CM

## 2019-10-20 NOTE — Progress Notes (Signed)
[facility] [address] [place]. [place] [address] [phone]       HOSPITAL FOLLOW UP NOTE  [name] Date of Birth:  1960/06/30 Medical Record Number:  [id number]   Reason for Referral:  hospital stroke follow up    SUBJECTIVE:   CHIEF COMPLAINT:  Chief Complaint  Patient presents with  . Follow-up    rm 9, with daughter, pt reports fatigue and pain in left shoulder    HPI:   [name] is a 59 y.o. female with history of ICH 7 yrs ago and HTN found unresponsive on 07/18/2019 with L sided hemiplegia, R gaze, and SBP 250. 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 Serial repeat CT heads below with eventual improvement on repeat CT head 4/5. 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 EEG showed severe diffuse encephalopathy with repeat EEG 4/5 R frontal temporal slowing c/w ICH, no seizure activity. LDL 63 and A1c 5.7. No prior use of antithrombotic and no indication for initiation given hemorrhage. Hypertensive emergency treated with Cleviprex and transition to Coreg 25 mg twice daily and chlorthalidone 12.5 mg daily with long-term BP goal normotensive range. No prior history of DM with hyperglycemia during admission requiring insulin coverage. Other stroke risk factors include obesity, prior history of ICH 7 years ago (no data available via epic) and obstructive sleep apnea. Residual deficits of dysphagia s/p PEG, nonverbal, no spontaneous extremity movement and left eye downward gaze, and right eye midposition with slight downward gaze. Palliative care met with family members and remain full code with full scope of treatment. Evaluated by therapy and recommended discharge to SNF.  Stroke:   R thalamic and basal ganglia ICH w/ IVH s/p EVD placement, hemorrhage secondary to hypertensive source  Code Stroke CT head posterior R basal  ganglia and R thalamic IPH. Moderate IVH. Mass effect w/ partial effacment R lateral and 3rd ventricles. 45m L midline shift. Suspicious for early hydrocephalus.   Repeat CT head 3/16 interval L frontal lobe EVD w/ tip at R thalamus. Interval increased in R ICH now w/ 752mL midline shift. IVH extended into B lateral, 3rd and 4th ventricles. Prominent B superior ophthalmic vein suggestion increased ICP.   CT repeat 3/17 - stable MLS 47m58mhematoma and hydrocephalus  CT repeat 3/20 - stable hematoma and edeam. Decreased hydrocephalus.   CT repeat 3/25 evolution R thalamic ICH w/ decreasing clot. Increased ventricular volume s/p EVD removal. 1cm midline shift. Sinusitis.  CT repeat 3/26 - Stable volume of right thalamic and intraventricular hemorrhage. Ventriculomegaly with 2 mm of increased lateral ventricular diameter when compared yesterday.  CT repeat 3/28 - Slight interval decrease in size of right thalamic hemorrhage, but with similar degree of surrounding vasogenic edema. Localized 9 mm right-to-left shift relatively unchanged. Intraventricular extension with small volume intraventricular blood, stable. Lateral ventriculomegaly not significantly changed.  CT repeat 4/5 improvement in R thalamic hemorrhage, small ICH w/ improvement. Stable mild ventricular enlargement. Slight improvement in L midline shift.   2D Echo EF 60-65%. Severe LVH. 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 Pt to remain Full Code with full scope of treatment.   Today, 10/20/2019, [name] is being seen for hospital follow-up accompanied by her daughter and  granddaughter. Initially discharged to SNF but returned home with family yesterday. Residual deficits of left hemiplegia, dysphagia s/p PEG tube and modified diet, speech impairment, and right eye deviation.  In the process of setting up home health therapy and has PT coming at home today financial evaluation.  She is nonambulatory and transfers via wheelchair.  Her daughter was present during visit as well as her other daughter are currently caring for her full-time.  Diet modified without difficulty (daughter reports chopped food with thin liquids) and supplemental nutrition as needed via PEG.  Blood pressure today initially elevated and on recheck 146/82.  No further concerns at this time.     ROS:   14 system review of systems performed and negative with exception of weakness, speech impairment, swallowing difficulty  PMH:  Past Medical History:  Diagnosis Date  . COPD 06/27/2008   Qualifier: Diagnosis of  By: Halford Chessman MD, [place]    . Hypertension   . IVH (intraventricular hemorrhage) ([place]) 07/18/2019  . OBSTRUCTIVE SLEEP APNEA 06/27/2008   Qualifier: Diagnosis of  By: Halford Chessman MD, Vineet      PSH:  Past Surgical History:  Procedure Laterality Date  . IR GASTROSTOMY TUBE MOD SED  08/03/2019    Social History:  Social History   Socioeconomic History  . Marital status: Single    Spouse name: Not on file  . Number of children: Not on file  . Years of education: Not on file  . Highest education level: Not on file  Occupational History  . Not on file  Tobacco Use  . Smoking status: Never Smoker  . Smokeless tobacco: Never Used  Substance and Sexual Activity  . Alcohol use: Not Currently  . Drug use: Not on file  . Sexual activity: Not on file  Other Topics Concern  . Not on file  Social History Narrative  . Not on file   Social Determinants of Health   Financial Resource Strain:   . Difficulty of Paying Living Expenses:   Food Insecurity:   . Worried About Charity fundraiser  in the Last Year:   . Arboriculturist in the Last Year:   Transportation Needs:   . Film/video editor (Medical):   Marland Kitchen Lack of Transportation (Non-Medical):   Physical Activity:   . Days of Exercise per Week:   . Minutes of Exercise per Session:   Stress:   . Feeling of Stress :   Social Connections:   . Frequency of Communication with Friends and Family:   . Frequency of Social Gatherings with Friends and Family:   . Attends Religious Services:   . Active Member of Clubs or Organizations:   . Attends Archivist Meetings:   Marland Kitchen Marital Status:   Intimate Partner Violence:   . Fear of Current or Ex-Partner:   . Emotionally Abused:   Marland Kitchen Physically Abused:   . Sexually Abused:     Family History:  Family History  Problem Relation Age of Onset  . Hypertension Father   . Diabetes Father     Medications:   Current Outpatient Medications on File Prior to Visit  Medication Sig Dispense Refill  . busPIRone (BUSPAR) 5 MG tablet Take 1 tablet (5 mg total) by mouth every 12 (  twelve) hours. 60 tablet 0  . cholestyramine (QUESTRAN) 4 GM/DOSE powder Take 1 packet (4 g total) by mouth daily. Special Instructions: bulking for diarrhea 378 g 0  . enoxaparin (LOVENOX) 40 MG/0.4ML injection Inject 0.4 mLs (40 mg total) into the skin daily. 15 mL 0  . famotidine (PEPCID) 20 MG tablet Place 1 tablet (20 mg total) into feeding tube 2 (two) times daily.    . hydrALAZINE (APRESOLINE) 100 MG tablet Take 1 tablet (100 mg total) by mouth every 6 (six) hours. 120 tablet 0  . insulin aspart (NOVOLOG FLEXPEN) 100 UNIT/ML FlexPen Inject 5 Units into the skin 3 (three) times daily with meals. 15 mL 0  . insulin glargine (LANTUS) 100 UNIT/ML injection Inject 0.24 mLs (24 Units total) into the skin daily. 10 mL 0  . labetalol (NORMODYNE) 200 MG tablet Take 1 tablet (200 mg total) by mouth every 8 (eight) hours. 90 tablet 0  . loperamide (IMODIUM A-D) 2 MG tablet Take 2 mg by mouth every 12 (twelve)  hours as needed for diarrhea or loose stools.     . NON FORMULARY Accu-check ac & qhs. Notify provider of results under 60 or over 400. Four Times A Day 06:30 AM, 11:30 AM, 04:30 PM, 09:00 PM    . NON FORMULARY Diet Change: Dys 2 (ground), thin liquids (extra gravy on the side)    . NON FORMULARY Flush g-tube with 240 ml free water TID. Three Times A Day 09:00 AM, 02:00 PM, 09:00 PM    . Nutritional Supplements (FEEDING SUPPLEMENT, GLUCERNA 1.2 CAL,) LIQD Place into feeding tube. Glucerna 1.2 by g-tube at 120 ml/hr-noctural feed from 2000 until 0600 daily. Document total intake qshift. Special Instructions: Keep HOB elevated at 45degrees during feeds/flushes. Every Shift Day, Evening, Nigh     No current facility-administered medications on file prior to visit.    Allergies:   Allergies  Allergen Reactions  . Losartan Swelling  . Norvasc [Amlodipine] Swelling  . Ace Inhibitors     History of swelling with losartan  . Penicillins     Unable to verify reactions at this time/ Tolerated ceftriaxone March 2021      OBJECTIVE:  Physical Exam  Vitals:   10/20/19 0826 10/20/19 0833 10/20/19 0903  BP: (!) 165/103 (!) 166/87 (!) 146/82  Pulse: 91 84   Weight: 140 lb (63.5 kg)    Height: '5\' 1"'  (1.549 m)     Body mass index is 26.45 kg/m. No exam data present   General: well developed, well nourished,  pleasant middle-age African-American female, seated, in no evident distress Head: head normocephalic and atraumatic.   Neck: supple with no carotid or supraclavicular bruits Cardiovascular: regular rate and rhythm, no murmurs Musculoskeletal: no deformity Skin:  no rash/petichiae; completely healed tracheostomy site; PEG tube intact Vascular:  Normal pulses all extremities   Neurologic Exam Mental Status: Awake and fully alert.   Mild to moderate dysarthria.  Oriented to place and time. Recent and remote memory impaired. Attention span, concentration and fund of knowledge  impaired. Mood and affect appropriate.  Cranial Nerves: Fundoscopic exam reveals sharp disc margins.  Left eye inferior reference with difficulty looking up and slight difficulty looking side to side.  Right eye midposition and full movements.  Decreased left-sided extinction.  Visual fields difficulty assessing due to difficulty understanding -limited blink to threat in all quadrants. Hearing intact. Facial sensation intact.  Left lower facial weakness Motor: Full strength right upper and lower extremity LUE 1/5  spasticity LLE 0/5 spasticity Sensory.: reports increased tingling with light touch LLE; LUE response to painful stimuli Coordination: Rapid alternating movements normal on right side. Finger-to-nose and heel-to-shin performed accurately on right side. Gait and Station: deferred non ambulatory Reflexes: brisk left side; 1+ and symmetric. Toes downgoing.     NIHSS 14 1a.  Level of consciousness 0 1b. LOC questions 0 1c. LOC commands 0 2.  Best gaze 1 3.  Visual 0 4.  Facial palsy 1 5a.  Motor arm-left 4 5b.  Motor arm-right 0 6a.  Motor leg-left 4 6b.  Motor leg-right 0 7.  Limb ataxia 0 8.  Sensory 2 9.  Best language 0 10.  Dysarthria 1 11.  Extinction and inattention 1  Modified Rankin  4      ASSESSMENT: Haley Hernandez is a 59 y.o. 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 Vascular risk factors include HTN and prior ICH 7 years ago.  Discharge from SNF yesterday returning home with family with residual deficits of left hemiplegia, dysarthria, dysphagia and impaired eye movements   PLAN:  1. Right thalamic and BG ICH:  -Ensure initiation of home health therapies and likely transition to outpatient therapies once completed -Follow-up  scheduled with PCP as she returned home from SNF yesterday and needs hospital follow-up -Ongoing use of PEG tube for supplemental nutrition and medications as needed.  Continue to follow with PCP for monitoring management -Avoidance of aspirin, aspirin-containing products and ibuprofen products due to [place].   -Maintain strict control of hypertension with blood pressure goal below 130/90, diabetes with hemoglobin A1c goal below 6.5% and cholesterol with LDL cholesterol (bad cholesterol) goal below 70 mg/dL.  I also advised the patient to eat a healthy diet with plenty of whole grains, cereals, fruits and vegetables, exercise regularly with at least 30 minutes of continuous activity daily and maintain ideal body weight. 2. HTN: Initially elevated but stabilized during visit.  Discussion with daughter regarding importance of monitoring at home and to follow-up with PCP for further monitoring and management 3. Hyperglycemia ?new DM: Currently monitoring levels at home receiving insulin as needed.  Continue to follow with PCP for ongoing monitoring and management   Follow up in 3 months or call earlier if needed   I spent 45 minutes of face-to-face and non-face-to-face time with patient and daughter.  This included previsit chart review, lab review, study review, order entry, electronic health record documentation, patient education regarding recent stroke, residual deficits, importance of managing stroke risk factors and answered all questions to patient satisfaction     Frann Rider, [facility]  [facility] [address] [place], [place] [address]  Phone [phone] Fax [phone] Note: This document was prepared with digital dictation and possible smart phrase technology. Any transcriptional errors that result from this process are unintentional.

## 2019-10-20 NOTE — Patient Instructions (Signed)
Start therapies including physical, occupation and speech therapies  Follow up with PCP for hospital follow up and need medication refills   Continue to follow up with PCP regarding blood pressure management   Continue to monitor blood pressure at home  Maintain strict control of hypertension with blood pressure goal below 130/90, diabetes with hemoglobin A1c goal below 6.5% and cholesterol with LDL cholesterol (bad cholesterol) goal below 70 mg/dL. I also advised the patient to eat a healthy diet with plenty of whole grains, cereals, fruits and vegetables, exercise regularly and maintain ideal body weight.  Followup in the future with me in 3 months or call earlier if needed       Thank you for coming to see [place] at [facility]. I hope we have been able to provide you high quality care today.  You may receive a patient satisfaction survey over the next few weeks. We would appreciate your feedback and comments so that we may continue to improve ourselves and the health of our patients.

## 2019-10-21 NOTE — Progress Notes (Signed)
I agree with the above plan 

## 2019-10-26 ENCOUNTER — Inpatient Hospital Stay: Payer: MEDICAID | Admitting: Adult Health

## 2019-11-12 ENCOUNTER — Ambulatory Visit (HOSPITAL_COMMUNITY): Payer: Self-pay

## 2019-11-12 ENCOUNTER — Telehealth (HOSPITAL_COMMUNITY): Payer: Self-pay

## 2019-11-12 NOTE — Telephone Encounter (Signed)
Called pt to inquire about appt c/c. Pt states she "wants her feeding tube out but no one would take it out b/c [facility] put it in". Per Linus Mako, NP advised pt to go to ER if pt feels c/c is urgent in nature or call her PMD to arrange a referral for eval of feeding tube; that issues with feeding tube is beyond the scope of care of [facility]. Pt verbalized understanding.

## 2019-11-14 ENCOUNTER — Other Ambulatory Visit (HOSPITAL_COMMUNITY): Payer: Self-pay | Admitting: Internal Medicine

## 2019-11-14 DIAGNOSIS — I639 Cerebral infarction, unspecified: Secondary | ICD-10-CM

## 2019-11-14 DIAGNOSIS — R1312 Dysphagia, oropharyngeal phase: Secondary | ICD-10-CM

## 2019-11-15 ENCOUNTER — Telehealth: Payer: Self-pay | Admitting: Internal Medicine

## 2019-11-15 NOTE — Telephone Encounter (Signed)
Lm on vm for pt to contact current PCP bc pt has been dc from [facility] & no longer Synthia Innocent, NP pt.  Thanks,  Rosezella [place].

## 2019-11-21 ENCOUNTER — Other Ambulatory Visit: Payer: Self-pay

## 2019-11-21 ENCOUNTER — Ambulatory Visit (HOSPITAL_COMMUNITY)
Admission: RE | Admit: 2019-11-21 | Discharge: 2019-11-21 | Disposition: A | Discharge status: Home / Self Care | Payer: 59 | Source: Ambulatory Visit | Attending: Internal Medicine | Admitting: Internal Medicine

## 2019-11-21 DIAGNOSIS — R1310 Dysphagia, unspecified: Secondary | ICD-10-CM | POA: Diagnosis not present

## 2019-11-21 DIAGNOSIS — Z431 Encounter for attention to gastrostomy: Secondary | ICD-10-CM | POA: Diagnosis not present

## 2019-11-21 DIAGNOSIS — R1312 Dysphagia, oropharyngeal phase: Secondary | ICD-10-CM

## 2019-11-21 DIAGNOSIS — I639 Cerebral infarction, unspecified: Secondary | ICD-10-CM

## 2019-11-21 HISTORY — PX: IR GASTROSTOMY TUBE REMOVAL: IMG5492

## 2019-11-21 MED ORDER — LIDOCAINE VISCOUS HCL 2 % MT SOLN
OROMUCOSAL | Status: AC
  Filled 2019-11-21: qty 15

## 2019-11-21 NOTE — Procedures (Signed)
Pre-procedure diagnosis: Dysphagia Post-procedure diagnosis: Same  Successful bedside removal of intact pull through G-tube. No immediate post procedural complications. EBL: zero  Please see imaging section of Epic for full dictation.  Haley Hernandez 11/21/2019 9:22 AM

## 2020-02-01 ENCOUNTER — Ambulatory Visit (INDEPENDENT_AMBULATORY_CARE_PROVIDER_SITE_OTHER): Payer: 59 | Admitting: Adult Health

## 2020-02-01 ENCOUNTER — Other Ambulatory Visit: Payer: Self-pay

## 2020-02-01 ENCOUNTER — Encounter: Payer: Self-pay | Admitting: Adult Health

## 2020-02-01 VITALS — BP 150/92 | HR 64

## 2020-02-01 DIAGNOSIS — I612 Nontraumatic intracerebral hemorrhage in hemisphere, unspecified: Secondary | ICD-10-CM

## 2020-02-01 DIAGNOSIS — I10 Essential (primary) hypertension: Secondary | ICD-10-CM | POA: Diagnosis not present

## 2020-02-01 DIAGNOSIS — G8194 Hemiplegia, unspecified affecting left nondominant side: Secondary | ICD-10-CM | POA: Diagnosis not present

## 2020-02-01 NOTE — Progress Notes (Signed)
[facility] [address] [place]. [place] [address] [phone]       STROKE FOLLOW UP NOTE  [name] Date of Birth:  11-28-1960 Medical Record Number:  [id number]   Reason for Referral: stroke follow up    SUBJECTIVE:   CHIEF COMPLAINT:  Chief Complaint  Patient presents with  . Follow-up  . Cerebrovascular Accident    HPI:   Today, 02/01/2020, Haley Hernandez returns for stroke follow-up accompanied by her daughter.  Stable since prior visit with residual deficits left spastic hemiplegia, and dysarthria. No residual swallowing difficulties on a regular diet- PEG tube has since been removed. She is currently working with outpatient [facility].  She is able to transfer via stand/pivot.  PCP recently started baclofen 5 mg 1-2 tabs at bedtime for spasticity.  Daughter primary caregiver. Denies new or worsening stroke/TIA symptoms. Blood pressure today 150/92.  Occasionally monitors at home which has been stable.  No concerns at this time.    History provided for reference purposes only Update 10/20/2019 JM: Haley Hernandez is being seen for hospital follow-up accompanied by her daughter and granddaughter. Initially discharged to SNF but returned home with family yesterday. Residual deficits of left hemiplegia, dysphagia s/p PEG tube and modified diet, speech impairment, and right eye deviation.  In the process of setting up home health therapy and has PT coming at home today financial evaluation.  She is nonambulatory and transfers via wheelchair.  Her daughter was present during visit as well as her other daughter are currently caring for her full-time.  Diet modified without difficulty (daughter reports chopped food with thin liquids) and supplemental nutrition as needed via PEG.  Blood pressure today initially elevated and on recheck 146/82.  No further concerns at this time.  Stroke admission 07/18/2019 [name] is a 59 y.o.  female with history of ICH 7 yrs ago and HTN found unresponsive on 07/18/2019 with L sided hemiplegia, R gaze, and SBP 250. 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 Serial repeat CT heads below with eventual improvement on repeat CT head 4/5. 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 EEG showed severe diffuse encephalopathy with repeat EEG 4/5 R frontal temporal slowing c/w ICH, no seizure activity. LDL 63 and A1c 5.7. No prior use of antithrombotic and no indication for initiation given hemorrhage. Hypertensive emergency treated with Cleviprex and transition to Coreg 25 mg twice daily and chlorthalidone 12.5 mg daily with long-term BP goal normotensive range. No prior history of DM with hyperglycemia during admission requiring insulin coverage. Other stroke risk factors include obesity, prior history of ICH 7 years ago (no data available via epic) and obstructive sleep apnea. Residual deficits of dysphagia s/p PEG, nonverbal, no spontaneous extremity movement and left eye downward gaze, and right eye midposition with slight downward gaze. Palliative care met with family members and remain full code with full scope of treatment. Evaluated by therapy and recommended discharge to SNF.  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 Moderate IVH. Mass effect w/ partial effacment R lateral and 3rd ventricles. 68m L midline shift. Suspicious for early hydrocephalus.   Repeat CT head 3/16 interval L frontal lobe EVD w/ tip at R thalamus. Interval increased in R ICH now  w/ 34m L midline shift. IVH extended into B lateral, 3rd and 4th ventricles. Prominent B superior ophthalmic vein suggestion increased ICP.   CT repeat 3/17 - stable MLS 748m hematoma and hydrocephalus  CT  repeat 3/20 - stable hematoma and edeam. Decreased hydrocephalus.   CT repeat 3/25 evolution R thalamic ICH w/ decreasing clot. Increased ventricular volume s/p EVD removal. 1cm midline shift. Sinusitis.  CT repeat 3/26 - Stable volume of right thalamic and intraventricular hemorrhage. Ventriculomegaly with 2 mm of increased lateral ventricular diameter when compared yesterday.  CT repeat 3/28 - Slight interval decrease in size of right thalamic hemorrhage, but with similar degree of surrounding vasogenic edema. Localized 9 mm right-to-left shift relatively unchanged. Intraventricular extension with small volume intraventricular blood, stable. Lateral ventriculomegaly not significantly changed.  CT repeat 4/5 improvement in R thalamic hemorrhage, small ICH w/ improvement. Stable mild ventricular enlargement. Slight improvement in L midline shift.   2D Echo EF 60-65%. Severe LVH. 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 Pt to remain Full Code with full scope of treatment.       ROS:   14 system review of systems performed and negative with exception of those listed in HPI  PMH:  Past Medical History:  Diagnosis Date  . COPD 06/27/2008   Qualifier: Diagnosis of  By: SoHalford ChessmanD, Vineet    . Hypertension   . IVH (intraventricular hemorrhage) ([facility]3/15/2021  . OBSTRUCTIVE SLEEP APNEA 06/27/2008   Qualifier: Diagnosis of  By: SoHalford ChessmanD, Vineet      PSH:  Past Surgical History:  Procedure Laterality Date  . IR GASTROSTOMY TUBE MOD SED  08/03/2019  . IR GASTROSTOMY TUBE REMOVAL  11/21/2019    Social History:  Social History    Socioeconomic History  . Marital status: Single    Spouse name: Not on file  . Number of children: Not on file  . Years of education: Not on file  . Highest education level: Not on file  Occupational History  . Not on file  Tobacco Use  . Smoking status: Never Smoker  . Smokeless tobacco: Never Used  Substance and Sexual Activity  . Alcohol use: Not Currently  . Drug use: Not on file  . Sexual activity: Not on file  Other Topics Concern  . Not on file  Social History Narrative  . Not on file   Social Determinants of Health   Financial Resource Strain:   . Difficulty of Paying Living Expenses: Not on file  Food Insecurity:   . Worried About RuCharity fundraisern the Last Year: Not on file  . Ran Out of Food in the Last Year: Not on file  Transportation Needs:   . Lack of Transportation (Medical): Not on file  . Lack of Transportation (Non-Medical): Not on file  Physical Activity:   . Days of Exercise per Week: Not on file  . Minutes of Exercise per Session: Not on file  Stress:   . Feeling of Stress : Not on file  Social Connections:   . Frequency of Communication with Friends and Family: Not on file  .  Frequency of Social Gatherings with Friends and Family: Not on file  . Attends Religious Services: Not on file  . Active Member of Clubs or Organizations: Not on file  . Attends Archivist Meetings: Not on file  . Marital Status: Not on file  Intimate Partner Violence:   . Fear of Current or Ex-Partner: Not on file  . Emotionally Abused: Not on file  . Physically Abused: Not on file  . Sexually Abused: Not on file    Family History:  Family History  Problem Relation Age of Onset  . Hypertension Father   . Diabetes Father     Medications:   Current Outpatient Medications on File Prior to Visit  Medication Sig Dispense Refill  . BACLOFEN PO Take by mouth.    . busPIRone (BUSPAR) 5 MG tablet Take 1 tablet (5 mg total) by mouth every 12 (twelve)  hours. 60 tablet 0  . Cholecalciferol (D3-1000) 25 MCG (1000 UT) tablet Take 1,000 Units by mouth daily.    Marland Kitchen gabapentin (NEURONTIN) 100 MG capsule Take 100 mg by mouth 3 (three) times daily.    . hydrALAZINE (APRESOLINE) 100 MG tablet Take 1 tablet (100 mg total) by mouth every 6 (six) hours. 120 tablet 0  . labetalol (NORMODYNE) 200 MG tablet Take 1 tablet (200 mg total) by mouth every 8 (eight) hours. 90 tablet 0  . NON FORMULARY Accu-check ac & qhs. Notify provider of results under 60 or over 400. Four Times A Day 06:30 AM, 11:30 AM, 04:30 PM, 09:00 PM    . NON FORMULARY Diet Change: Dys 2 (ground), thin liquids (extra gravy on the side)    . NON FORMULARY Flush g-tube with 240 ml free water TID. Three Times A Day 09:00 AM, 02:00 PM, 09:00 PM    . Nutritional Supplements (FEEDING SUPPLEMENT, GLUCERNA 1.2 CAL,) LIQD Place into feeding tube. Glucerna 1.2 by g-tube at 120 ml/hr-noctural feed from 2000 until 0600 daily. Document total intake qshift. Special Instructions: Keep HOB elevated at 45degrees during feeds/flushes. Every Shift Day, Evening, Nigh     No current facility-administered medications on file prior to visit.    Allergies:   Allergies  Allergen Reactions  . Losartan Swelling  . Norvasc [Amlodipine] Swelling  . Ace Inhibitors     History of swelling with losartan  . Penicillins     Unable to verify reactions at this time/ Tolerated ceftriaxone March 2021      OBJECTIVE:  Physical Exam  Vitals:   02/01/20 0819  BP: (!) 150/92  Pulse: 64   There is no height or weight on file to calculate BMI. No exam data present   General: well developed, well nourished, pleasant middle-age African-American female, seated, in no evident distress Head: head normocephalic and atraumatic.   Neck: supple with no carotid or supraclavicular bruits Cardiovascular: regular rate and rhythm, no murmurs Musculoskeletal: no deformity Skin:  no rash/petichiae Vascular:  Normal  pulses all extremities   Neurologic Exam Mental Status: Awake and fully alert.  Mild to moderate dysarthria. Oriented to place and time. Recent and remote memory impaired. Attention span, concentration and fund of knowledge impaired with daughter providing history. Mood and affect appropriate.  Cranial Nerves: Right gaze preference but able to cross midline. Visual fields decreased blink to threat on left.  Hearing intact. Facial sensation intact.  Left lower facial weakness Motor: Dense left spastic hemiplegia.  Full strength right upper and lower extremity.  Sensory.:  Left hemisensory impairment Coordination:  Rapid alternating movements normal on right side. Finger-to-nose and heel-to-shin performed accurately on right side. Gait and Station: deferred as non ambulatory Reflexes: brisk left side; 1+ and symmetric. Toes downgoing.        ASSESSMENT: Haley Hernandez is a 59 y.o. 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 Vascular risk factors include HTN and prior ICH 7 years ago.     PLAN:  1. Right thalamic and BG ICH:  a. Residual deficits: Dense left spastic hemiplegia, dysarthria and visual impairment.  Continue outpatient therapies.  Continue baclofen per PCP for spasticity.   b. Avoidance of aspirin, aspirin-containing products and ibuprofen products due to [place].  No indication to initiate antithrombotic as no prior stroke history or cardiac/vascular stents c. Discussed importance of close PCP follow-up for aggressive stroke risk factor management 2. HTN: BP goal<130/90.  Slightly elevated today but stable at home.  On labetalol and hydralazine per PCP    Follow up in 6 months or call earlier if needed   I spent 30 minutes of face-to-face and  non-face-to-face time with patient and daughter.  This included previsit chart review, lab review, study review, order entry, electronic health record documentation, patient and daughter education and discussion regarding prior stroke, residual deficits, importance of managing stroke risk factors and answered all questions to patient satisfaction   Frann Rider, [facility]  [facility] [address] [place], [place] [address]  Phone [phone] Fax [phone] Note: This document was prepared with digital dictation and possible smart phrase technology. Any transcriptional errors that result from this process are unintentional.

## 2020-02-01 NOTE — Patient Instructions (Signed)
Continue working with therapy and ongoing use of baclofen and gabapentin managed by PCP  Continue to follow up with PCP regarding cholesterol and blood pressure management  Maintain strict control of hypertension with blood pressure goal below 130/90 and cholesterol with LDL cholesterol (bad cholesterol) goal below 70 mg/dL.      Followup in the future with me in 6 months or call earlier if needed       Thank you for coming to see [place] at [facility]. I hope we have been able to provide you high quality care today.  You may receive a patient satisfaction survey over the next few weeks. We would appreciate your feedback and comments so that we may continue to improve ourselves and the health of our patients.

## 2020-02-01 NOTE — Progress Notes (Signed)
I agree with the above plan 

## 2020-02-11 ENCOUNTER — Emergency Department (HOSPITAL_COMMUNITY): Payer: 59

## 2020-02-11 ENCOUNTER — Other Ambulatory Visit: Payer: Self-pay

## 2020-02-11 ENCOUNTER — Encounter (HOSPITAL_COMMUNITY): Payer: Self-pay | Admitting: Emergency Medicine

## 2020-02-11 ENCOUNTER — Emergency Department (HOSPITAL_COMMUNITY)
Admission: EM | Admit: 2020-02-11 | Discharge: 2020-02-12 | Disposition: A | Discharge status: Home / Self Care | Payer: 59 | Attending: Emergency Medicine | Admitting: Emergency Medicine

## 2020-02-11 DIAGNOSIS — Z79899 Other long term (current) drug therapy: Secondary | ICD-10-CM | POA: Insufficient documentation

## 2020-02-11 DIAGNOSIS — J449 Chronic obstructive pulmonary disease, unspecified: Secondary | ICD-10-CM | POA: Diagnosis not present

## 2020-02-11 DIAGNOSIS — R079 Chest pain, unspecified: Secondary | ICD-10-CM | POA: Diagnosis not present

## 2020-02-11 DIAGNOSIS — I10 Essential (primary) hypertension: Secondary | ICD-10-CM | POA: Insufficient documentation

## 2020-02-11 DIAGNOSIS — E119 Type 2 diabetes mellitus without complications: Secondary | ICD-10-CM | POA: Diagnosis not present

## 2020-02-11 LAB — CBC WITH DIFFERENTIAL/PLATELET
Abs Immature Granulocytes: 0.02 10*3/uL (ref 0.00–0.07)
Basophils Absolute: 0.1 10*3/uL (ref 0.0–0.1)
Basophils Relative: 1 %
Eosinophils Absolute: 0.1 10*3/uL (ref 0.0–0.5)
Eosinophils Relative: 1 %
HCT: 35.6 % — ABNORMAL LOW (ref 36.0–46.0)
Hemoglobin: 11.1 g/dL — ABNORMAL LOW (ref 12.0–15.0)
Immature Granulocytes: 0 %
Lymphocytes Relative: 24 %
Lymphs Abs: 1.7 10*3/uL (ref 0.7–4.0)
MCH: 26.8 pg (ref 26.0–34.0)
MCHC: 31.2 g/dL (ref 30.0–36.0)
MCV: 86 fL (ref 80.0–100.0)
Monocytes Absolute: 0.6 10*3/uL (ref 0.1–1.0)
Monocytes Relative: 8 %
Neutro Abs: 4.6 10*3/uL (ref 1.7–7.7)
Neutrophils Relative %: 66 %
Platelets: 214 10*3/uL (ref 150–400)
RBC: 4.14 MIL/uL (ref 3.87–5.11)
RDW: 14.6 % (ref 11.5–15.5)
WBC: 7.1 10*3/uL (ref 4.0–10.5)
nRBC: 0 % (ref 0.0–0.2)

## 2020-02-11 MED ORDER — ASPIRIN 81 MG PO CHEW
324.0000 mg | CHEWABLE_TABLET | Freq: Once | ORAL | Status: AC
  Administered 2020-02-12: 324 mg via ORAL
  Filled 2020-02-11: qty 4

## 2020-02-11 NOTE — ED Provider Notes (Signed)
[facility], [place] EMERGENCY DEPARTMENT Provider Note   CSN: [id number] Arrival date & time: 02/11/20  2149     History Chief Complaint  Patient presents with  . Chest Pain    Haley Hernandez is a 59 y.o. female.  Patient with history of hypertension, hemorrhagic stroke with residual left-sided deficits, presents to the emergency department with a chief complaint of chest pain.  She states that she was awakened with left sided chest pain this evening.  She states that it lasted for approximately 10 minutes.  She denies associated shortness of breath, diaphoresis, or nausea.  She denies any abdominal pain.  Denies any treatments prior to arrival.  The history is provided by the patient. No language interpreter was used.       Past Medical History:  Diagnosis Date  . COPD 06/27/2008   Qualifier: Diagnosis of  By: Craige Cotta MD, Vineet    . Hypertension   . IVH (intraventricular hemorrhage) (HCC) 07/18/2019  . OBSTRUCTIVE SLEEP APNEA 06/27/2008   Qualifier: Diagnosis of  By: Craige Cotta MD, Vineet      Patient Active Problem List   Diagnosis Date Noted  . Oral thrush 10/10/2019  . Hypercalcemia 09/27/2019  . Normochromic normocytic anemia 09/27/2019  . Diarrhea 09/27/2019  . Diplopia 09/27/2019  . Dysphagia due to recent cerebrovascular accident (CVA) 09/22/2019  . GERD without esophagitis 09/22/2019  . 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 Hypertension associated with type 2 diabetes mellitus (HCC) 09/22/2019  . Chronic constipation 09/22/2019  . Major depression, recurrent, chronic (HCC) 09/22/2019  . 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 Status post tracheostomy (HCC)   . Tongue swelling   . Palliative care by specialist   . Goals of care, counseling/discussion   . Full code status   . Acute respiratory failure with hypoxemia (HCC) 07/23/2019  . Hypokalemia 07/23/2019  .  Hypophosphatemia 07/23/2019  . IVH (intraventricular hemorrhage) (HCC) 07/18/2019  . OBSTRUCTIVE SLEEP APNEA 06/27/2008    Past Surgical History:  Procedure Laterality Date  . IR GASTROSTOMY TUBE MOD SED  08/03/2019  . IR GASTROSTOMY TUBE REMOVAL  11/21/2019     OB History   No obstetric history on file.     Family History  Problem Relation Age of Onset  . Hypertension Father   . Diabetes Father     Social History   Tobacco Use  . Smoking status: Never Smoker  . Smokeless tobacco: Never Used  Substance Use Topics  . Alcohol use: Not Currently  . Drug use: Not on file    Home Medications Prior to Admission medications   Medication Sig Start Date End Date Taking? Authorizing Provider  BACLOFEN PO Take by mouth.    [provider]  busPIRone (BUSPAR) 5 MG tablet Take 1 tablet (5 mg total) by mouth every 12 (twelve) hours. 10/19/19   Sharee Holster, NP  Cholecalciferol (D3-1000) 25 MCG (1000 UT) tablet Take 1,000 Units by mouth daily.    [provider]  gabapentin (NEURONTIN) 100 MG capsule Take 100 mg by mouth 3 (three) times daily.    [provider]  hydrALAZINE (APRESOLINE) 100 MG tablet Take 1 tablet (100 mg total) by mouth every 6 (six) hours. 10/19/19   Sharee Holster, NP  labetalol (NORMODYNE) 200 MG tablet Take 1 tablet (200 mg total) by mouth every 8 (eight) hours. 10/19/19   Sharee Holster, NP  NON [place] [facility]  Accu-check ac & qhs. Notify provider of results under 60 or over 400. Four Times A Day 06:30 AM, 11:30 AM, 04:30 PM, 09:00 PM 09/21/19   [provider]  NON FORMULARY Diet Change: Dys 2 (ground), thin liquids (extra gravy on the side) 09/27/19   [provider]  NON FORMULARY Flush g-tube with 240 ml free water TID. Three Times A Day 09:00 AM, 02:00 PM, 09:00 PM 09/22/19   [provider]  Nutritional Supplements (FEEDING SUPPLEMENT, GLUCERNA 1.2 CAL,) LIQD Place into feeding tube. Glucerna 1.2 by g-tube  at 120 ml/hr-noctural feed from 2000 until 0600 daily. Document total intake qshift. Special Instructions: Keep HOB elevated at 45degrees during feeds/flushes. Every Shift Day, Evening, Nigh 09/29/19   [provider]    Allergies    Losartan, Norvasc [amlodipine], Ace inhibitors, and Penicillins  Review of Systems   Review of Systems  All other systems reviewed and are negative.   Physical Exam Updated Vital Signs BP (!) 159/97   Pulse 77   Temp 98.9 F (37.2 C) (Oral)   Resp 14   SpO2 95%   Physical Exam Vitals and nursing note reviewed.  Constitutional:      General: She is not in acute distress.    Appearance: She is well-developed.  HENT:     Head: Normocephalic and atraumatic.  Eyes:     Conjunctiva/sclera: Conjunctivae normal.  Cardiovascular:     Rate and Rhythm: Normal rate and regular rhythm.     Heart sounds: No murmur heard.   Pulmonary:     Effort: Pulmonary effort is normal. No respiratory distress.     Breath sounds: Normal breath sounds.  Abdominal:     Palpations: Abdomen is soft.     Tenderness: There is no abdominal tenderness.  Musculoskeletal:        General: Normal range of motion.     Cervical back: Neck supple.  Skin:    General: Skin is warm and dry.  Neurological:     Mental Status: She is alert and oriented to person, place, and time.  Psychiatric:        Mood and Affect: Mood normal.        Behavior: Behavior normal.     ED Results / Procedures / Treatments   Labs (all labs ordered are listed, but only abnormal results are displayed) Labs Reviewed  CBC WITH DIFFERENTIAL/PLATELET - Abnormal; Notable for the following components:      Result Value   Hemoglobin 11.1 (*)    HCT 35.6 (*)    All other components within normal limits  BASIC METABOLIC PANEL - Abnormal; Notable for the following components:   Glucose, Bld 112 (*)    Calcium 10.6 (*)    All other components within normal limits  TROPONIN I (HIGH  SENSITIVITY)  TROPONIN I (HIGH SENSITIVITY)    EKG EKG Interpretation  Date/Time:  Saturday February 11 2020 21:52:38 EDT Ventricular Rate:  68 PR Interval:    QRS Duration: 103 QT Interval:  427 QTC Calculation: 455 R Axis:   -41 Text Interpretation: Sinus rhythm Borderline prolonged PR interval Left anterior fascicular block Abnormal R-wave progression, late transition LVH with secondary repolarization abnormality When compared with ECG of 08/20/2019, T wave inversion Lateral leads is less prominent Confirmed by Dione Booze ([id number]) on 02/11/2020 9:57:34 PM   Radiology DG Chest Port 1 View  Result Date: 02/11/2020 CLINICAL DATA:  Chest pain EXAM: PORTABLE CHEST 1 VIEW COMPARISON:  08/06/2019 FINDINGS: Tracheostomy  and right upper extremity PICC line have been removed. Mild left basilar scarring is unchanged. Lungs are otherwise clear. No pneumothorax or pleural effusion. Cardiac size is mildly enlarged, unchanged. Pulmonary vascularity is normal. No acute bone abnormality. IMPRESSION: No active disease. Electronically Signed   By: Helyn Numbers MD   On: 02/11/2020 23:06    Procedures Procedures (including critical care time)  Medications Ordered in ED Medications  aspirin chewable tablet 324 mg (has no administration in time range)    ED Course  I have reviewed the triage vital signs and the nursing notes.  Pertinent labs & imaging results that were available during my care of the patient were reviewed by me and considered in my medical decision making (see chart for details).    MDM Rules/Calculators/A&P                          This patient complains of chest pain that awakened her from sleep and lasted about 10 minutes, this involves an extensive number of treatment options, and is a complaint that carries with it a high risk of complications and morbidity.    Differential Dx ACS, pneumonia, ptx, covid  Pertinent Labs I ordered, reviewed, and interpreted labs, which  included CBC, BMP, Trop.  Trops x2 were negative.  Imaging Interpretation I ordered imaging studies which included CXR.  I independently visualized and interpreted the CXR, which showed no obvious abnormality.   Medications I ordered medication aspirin for potential ACS.  Sources Additional history obtained from daughter. Previous records obtained and reviewed prior hemorrhagic stroke.   Critical Interventions  none  Reassessments After the interventions stated above, I reevaluated the patient and found stable and pain free.  Consultants None  Plan Patient here with chest pain.  Chest pain lasted approximately 10 minutes.  She has a heart score of 5.  No concerning ischemic EKG findings.  She has had 2 - troponins.  Vital signs are stable.  She is in no acute distress.  Believe that patient is safe for discharge with close outpatient follow-up.  I provided the patient with contact information for the cardiology group.  Daughter understands and agrees with plan.  Return precautions discussed.    Final Clinical Impression(s) / ED Diagnoses Final diagnoses:  Nonspecific chest pain    Rx / DC Orders ED Discharge Orders    None       Roxy Horseman, PA-C 02/12/20 [id number]    Dione Booze, MD 02/12/20 2248

## 2020-02-11 NOTE — ED Triage Notes (Signed)
Pt c/o left sided chest pain that radiates down her flank and left arm tingling. Pt reports pain started tonight and woke her from sleep. Hx CVA with left sided deficits. EMS VSS.

## 2020-02-12 LAB — BASIC METABOLIC PANEL
Anion gap: 7 (ref 5–15)
BUN: 10 mg/dL (ref 6–20)
CO2: 27 mmol/L (ref 22–32)
Calcium: 10.6 mg/dL — ABNORMAL HIGH (ref 8.9–10.3)
Chloride: 106 mmol/L (ref 98–111)
Creatinine, Ser: 0.75 mg/dL (ref 0.44–1.00)
GFR, Estimated: >60 mL/min (ref >60)
Glucose, Bld: 112 mg/dL — ABNORMAL HIGH (ref 70–99)
Potassium: 3.9 mmol/L (ref 3.5–5.1)
Sodium: 140 mmol/L (ref 135–145)

## 2020-02-12 LAB — TROPONIN I (HIGH SENSITIVITY)
Troponin I (High Sensitivity): 6 ng/L (ref <18)
Troponin I (High Sensitivity): 6 ng/L (ref <18)

## 2020-02-25 ENCOUNTER — Other Ambulatory Visit: Payer: Self-pay

## 2020-02-25 ENCOUNTER — Encounter (HOSPITAL_BASED_OUTPATIENT_CLINIC_OR_DEPARTMENT_OTHER): Payer: Self-pay | Admitting: *Deleted

## 2020-02-25 ENCOUNTER — Inpatient Hospital Stay (HOSPITAL_BASED_OUTPATIENT_CLINIC_OR_DEPARTMENT_OTHER)
Admission: EM | Admit: 2020-02-25 | Discharge: 2020-03-05 | DRG: 378 | Disposition: A | Discharge status: Home Health | Payer: 59 | Attending: Internal Medicine | Admitting: Internal Medicine

## 2020-02-25 ENCOUNTER — Inpatient Hospital Stay (HOSPITAL_COMMUNITY): Payer: 59

## 2020-02-25 DIAGNOSIS — E1149 Type 2 diabetes mellitus with other diabetic neurological complication: Secondary | ICD-10-CM | POA: Diagnosis present

## 2020-02-25 DIAGNOSIS — Z8673 Personal history of transient ischemic attack (TIA), and cerebral infarction without residual deficits: Secondary | ICD-10-CM

## 2020-02-25 DIAGNOSIS — Z6827 Body mass index (BMI) 27.0-27.9, adult: Secondary | ICD-10-CM

## 2020-02-25 DIAGNOSIS — I69354 Hemiplegia and hemiparesis following cerebral infarction affecting left non-dominant side: Secondary | ICD-10-CM | POA: Diagnosis not present

## 2020-02-25 DIAGNOSIS — I152 Hypertension secondary to endocrine disorders: Secondary | ICD-10-CM | POA: Diagnosis not present

## 2020-02-25 DIAGNOSIS — K219 Gastro-esophageal reflux disease without esophagitis: Secondary | ICD-10-CM | POA: Diagnosis present

## 2020-02-25 DIAGNOSIS — E669 Obesity, unspecified: Secondary | ICD-10-CM | POA: Diagnosis present

## 2020-02-25 DIAGNOSIS — D62 Acute posthemorrhagic anemia: Secondary | ICD-10-CM | POA: Diagnosis present

## 2020-02-25 DIAGNOSIS — K5909 Other constipation: Secondary | ICD-10-CM | POA: Diagnosis present

## 2020-02-25 DIAGNOSIS — K921 Melena: Secondary | ICD-10-CM | POA: Diagnosis present

## 2020-02-25 DIAGNOSIS — I959 Hypotension, unspecified: Secondary | ICD-10-CM | POA: Diagnosis not present

## 2020-02-25 DIAGNOSIS — K5731 Diverticulosis of large intestine without perforation or abscess with bleeding: Secondary | ICD-10-CM | POA: Diagnosis present

## 2020-02-25 DIAGNOSIS — R001 Bradycardia, unspecified: Secondary | ICD-10-CM | POA: Diagnosis present

## 2020-02-25 DIAGNOSIS — Z79899 Other long term (current) drug therapy: Secondary | ICD-10-CM | POA: Diagnosis not present

## 2020-02-25 DIAGNOSIS — Z880 Allergy status to penicillin: Secondary | ICD-10-CM

## 2020-02-25 DIAGNOSIS — E1159 Type 2 diabetes mellitus with other circulatory complications: Secondary | ICD-10-CM | POA: Diagnosis present

## 2020-02-25 DIAGNOSIS — E876 Hypokalemia: Secondary | ICD-10-CM | POA: Diagnosis present

## 2020-02-25 DIAGNOSIS — R0789 Other chest pain: Secondary | ICD-10-CM | POA: Diagnosis not present

## 2020-02-25 DIAGNOSIS — K633 Ulcer of intestine: Secondary | ICD-10-CM | POA: Diagnosis present

## 2020-02-25 DIAGNOSIS — Z993 Dependence on wheelchair: Secondary | ICD-10-CM

## 2020-02-25 DIAGNOSIS — Z833 Family history of diabetes mellitus: Secondary | ICD-10-CM

## 2020-02-25 DIAGNOSIS — R079 Chest pain, unspecified: Secondary | ICD-10-CM | POA: Diagnosis not present

## 2020-02-25 DIAGNOSIS — Z8249 Family history of ischemic heart disease and other diseases of the circulatory system: Secondary | ICD-10-CM | POA: Diagnosis not present

## 2020-02-25 DIAGNOSIS — F419 Anxiety disorder, unspecified: Secondary | ICD-10-CM | POA: Diagnosis present

## 2020-02-25 DIAGNOSIS — K6389 Other specified diseases of intestine: Secondary | ICD-10-CM | POA: Diagnosis present

## 2020-02-25 DIAGNOSIS — D649 Anemia, unspecified: Secondary | ICD-10-CM

## 2020-02-25 DIAGNOSIS — F339 Major depressive disorder, recurrent, unspecified: Secondary | ICD-10-CM | POA: Diagnosis present

## 2020-02-25 DIAGNOSIS — J449 Chronic obstructive pulmonary disease, unspecified: Secondary | ICD-10-CM | POA: Diagnosis present

## 2020-02-25 DIAGNOSIS — I313 Pericardial effusion (noninflammatory): Secondary | ICD-10-CM | POA: Diagnosis present

## 2020-02-25 DIAGNOSIS — Z20822 Contact with and (suspected) exposure to covid-19: Secondary | ICD-10-CM | POA: Diagnosis present

## 2020-02-25 DIAGNOSIS — Z888 Allergy status to other drugs, medicaments and biological substances status: Secondary | ICD-10-CM

## 2020-02-25 DIAGNOSIS — K625 Hemorrhage of anus and rectum: Secondary | ICD-10-CM | POA: Diagnosis present

## 2020-02-25 DIAGNOSIS — G4733 Obstructive sleep apnea (adult) (pediatric): Secondary | ICD-10-CM | POA: Diagnosis present

## 2020-02-25 DIAGNOSIS — K640 First degree hemorrhoids: Secondary | ICD-10-CM | POA: Diagnosis present

## 2020-02-25 DIAGNOSIS — I10 Essential (primary) hypertension: Secondary | ICD-10-CM | POA: Diagnosis present

## 2020-02-25 DIAGNOSIS — I444 Left anterior fascicular block: Secondary | ICD-10-CM | POA: Diagnosis present

## 2020-02-25 HISTORY — DX: Cerebral infarction, unspecified: I63.9

## 2020-02-25 HISTORY — DX: Type 2 diabetes mellitus without complications: E11.9

## 2020-02-25 LAB — COMPREHENSIVE METABOLIC PANEL
ALT: 14 U/L (ref 0–44)
AST: 15 U/L (ref 15–41)
Albumin: 3.7 g/dL (ref 3.5–5.0)
Alkaline Phosphatase: 48 U/L (ref 38–126)
Anion gap: 7 (ref 5–15)
BUN: 8 mg/dL (ref 6–20)
CO2: 30 mmol/L (ref 22–32)
Calcium: 10.1 mg/dL (ref 8.9–10.3)
Chloride: 102 mmol/L (ref 98–111)
Creatinine, Ser: 0.68 mg/dL (ref 0.44–1.00)
GFR, Estimated: >60 mL/min (ref >60)
Glucose, Bld: 106 mg/dL — ABNORMAL HIGH (ref 70–99)
Potassium: 4.4 mmol/L (ref 3.5–5.1)
Sodium: 139 mmol/L (ref 135–145)
Total Bilirubin: 0.3 mg/dL (ref 0.3–1.2)
Total Protein: 6.6 g/dL (ref 6.5–8.1)

## 2020-02-25 LAB — CBC WITH DIFFERENTIAL/PLATELET
Abs Immature Granulocytes: 0.01 10*3/uL (ref 0.00–0.07)
Abs Immature Granulocytes: 0.02 10*3/uL (ref 0.00–0.07)
Basophils Absolute: 0.1 10*3/uL (ref 0.0–0.1)
Basophils Absolute: 0.1 10*3/uL (ref 0.0–0.1)
Basophils Relative: 1 %
Basophils Relative: 1 %
Eosinophils Absolute: 0.1 10*3/uL (ref 0.0–0.5)
Eosinophils Absolute: 0.1 10*3/uL (ref 0.0–0.5)
Eosinophils Relative: 1 %
Eosinophils Relative: 1 %
HCT: 34.1 % — ABNORMAL LOW (ref 36.0–46.0)
HCT: 31.4 % — ABNORMAL LOW (ref 36.0–46.0)
Hemoglobin: 10.8 g/dL — ABNORMAL LOW (ref 12.0–15.0)
Hemoglobin: 10.1 g/dL — ABNORMAL LOW (ref 12.0–15.0)
Immature Granulocytes: 0 %
Immature Granulocytes: 0 %
Lymphocytes Relative: 24 %
Lymphocytes Relative: 24 %
Lymphs Abs: 1.4 10*3/uL (ref 0.7–4.0)
Lymphs Abs: 1.8 10*3/uL (ref 0.7–4.0)
MCH: 27.2 pg (ref 26.0–34.0)
MCH: 27.6 pg (ref 26.0–34.0)
MCHC: 31.7 g/dL (ref 30.0–36.0)
MCHC: 32.2 g/dL (ref 30.0–36.0)
MCV: 85.9 fL (ref 80.0–100.0)
MCV: 85.8 fL (ref 80.0–100.0)
Monocytes Absolute: 0.4 10*3/uL (ref 0.1–1.0)
Monocytes Absolute: 0.5 10*3/uL (ref 0.1–1.0)
Monocytes Relative: 7 %
Monocytes Relative: 6 %
Neutro Abs: 3.9 10*3/uL (ref 1.7–7.7)
Neutro Abs: 5 10*3/uL (ref 1.7–7.7)
Neutrophils Relative %: 67 %
Neutrophils Relative %: 68 %
Platelets: 223 10*3/uL (ref 150–400)
Platelets: 205 10*3/uL (ref 150–400)
RBC: 3.97 MIL/uL (ref 3.87–5.11)
RBC: 3.66 MIL/uL — ABNORMAL LOW (ref 3.87–5.11)
RDW: 14.5 % (ref 11.5–15.5)
RDW: 14.4 % (ref 11.5–15.5)
WBC: 5.9 10*3/uL (ref 4.0–10.5)
WBC: 7.4 10*3/uL (ref 4.0–10.5)
nRBC: 0 % (ref 0.0–0.2)
nRBC: 0 % (ref 0.0–0.2)

## 2020-02-25 LAB — MRSA PCR SCREENING: MRSA by PCR: NEGATIVE

## 2020-02-25 LAB — RESPIRATORY PANEL BY RT PCR (FLU A&B, COVID)
Influenza A by PCR: NEGATIVE
Influenza B by PCR: NEGATIVE
SARS Coronavirus 2 by RT PCR: NEGATIVE

## 2020-02-25 LAB — PROTIME-INR
INR: 1 (ref 0.8–1.2)
Prothrombin Time: 12.9 seconds (ref 11.4–15.2)

## 2020-02-25 MED ORDER — ORAL CARE MOUTH RINSE
15.0000 mL | Freq: Two times a day (BID) | OROMUCOSAL | Status: DC
  Administered 2020-02-25 – 2020-03-05 (×17): 15 mL via OROMUCOSAL

## 2020-02-25 MED ORDER — SODIUM CHLORIDE 0.9 % IV SOLN: INTRAVENOUS | Status: DC

## 2020-02-25 MED ORDER — SODIUM CHLORIDE 0.9 % IV BOLUS
250.0000 mL | Freq: Once | INTRAVENOUS | Status: AC
  Administered 2020-02-25: 250 mL via INTRAVENOUS

## 2020-02-25 MED ORDER — HYDRALAZINE HCL 20 MG/ML IJ SOLN
10.0000 mg | INTRAMUSCULAR | Status: DC | PRN
  Administered 2020-02-26 – 2020-03-05 (×7): 10 mg via INTRAVENOUS
  Filled 2020-02-25 (×7): qty 1

## 2020-02-25 MED ORDER — IOHEXOL 350 MG/ML SOLN
100.0000 mL | Freq: Once | INTRAVENOUS | Status: AC | PRN
  Administered 2020-02-25: 100 mL via INTRAVENOUS

## 2020-02-25 MED ORDER — INSULIN ASPART 100 UNIT/ML ~~LOC~~ SOLN
0.0000 [IU] | SUBCUTANEOUS | Status: DC
  Administered 2020-02-26 – 2020-03-04 (×5): 1 [IU] via SUBCUTANEOUS

## 2020-02-25 MED ORDER — LABETALOL HCL 5 MG/ML IV SOLN
10.0000 mg | INTRAVENOUS | Status: DC | PRN
  Administered 2020-02-26 – 2020-03-05 (×6): 10 mg via INTRAVENOUS
  Filled 2020-02-25 (×7): qty 4

## 2020-02-25 MED ORDER — CHLORHEXIDINE GLUCONATE CLOTH 2 % EX PADS
6.0000 | MEDICATED_PAD | Freq: Every day | CUTANEOUS | Status: DC
  Administered 2020-02-25 – 2020-03-01 (×6): 6 via TOPICAL

## 2020-02-25 MED ORDER — SODIUM CHLORIDE 0.9% FLUSH
3.0000 mL | Freq: Two times a day (BID) | INTRAVENOUS | Status: DC
  Administered 2020-02-25 – 2020-03-05 (×18): 3 mL via INTRAVENOUS

## 2020-02-25 NOTE — ED Notes (Signed)
Assisted pt's daughter with cleaning and changing pt's brief; another large amt of dark red blood with large clots noted.

## 2020-02-25 NOTE — Assessment & Plan Note (Addendum)
-   Hx ICH approx 2014 and most recently 07/18/19 with recurrent ICH (R thalamic and basal ganglia) with IVH s/p EVD placement. - residual left hemiplegia, mild dysarthria, R eye deviation) - patient bedbound/WC bound; cared for by daughter at home; previously in SNF until about June (has been home since) - standing rec's from neurology to avoid aspirin and similar due to hx ICH.

## 2020-02-25 NOTE — Assessment & Plan Note (Signed)
-   hold home BP regimen in setting of GIB for now - use labetalol or hydralazine PRN

## 2020-02-25 NOTE — Assessment & Plan Note (Signed)
-  Per daughter, no prior history of similar.  Daughter also does not recall patient having had a colonoscopy in the past - Possible etiologies include diverticular bleed vs polyps vs hemorrhoids vs AVMs - currently hemodynamically stable with mild drop in Hgb 10.8>>10.1; continue trending Hgb - GI consulted - obtain CTA abd/pelvis given left sided abdominal pain; check lipase for thoroughness  - start IVF - keep NPO except sips/ice chips

## 2020-02-25 NOTE — ED Triage Notes (Signed)
Pt brought in by her daughter. Reports pt had a stroke in March. She has had 3 episodes of melena today (daughter has pictures on her phone). Pt c/o Left side abd pain

## 2020-02-25 NOTE — H&P (Signed)
History and Physical    Loney LohDoreen Hernandez  ZOX:[id number]  DOB: 11-18-1960  DOA: 02/25/2020  PCP: Benita StabileHall, Haley Z, MD Patient coming from: home  Chief Complaint: rectal bleeding  HPI:  [name] is a 59 yo female with PMH CVA (residual dense left hemiplegia, mild dysarthria, R eye deviation), HTN, COPD. 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 Was discharged to SNF and now has been back at home since about June and being cared for by her daughter.  [facility] after developing rectal bleeding at home.  She denied prior history of similar however cannot remember when last colonoscopy was and findings (possibly 2013 per epic history tab but report unable to be located).  [facility].  Per daughter, the patient had 3 episodes of rectal bleeding with left-sided abdominal pain today.  [facility] and provided further collateral information. She states patient has in fact not had a CLN in the past so this may need to be further clarified. Patient remains on no blood thinners, asa, etc in setting of her strokes noted above.  She has never had hemorrhoidal bleeds or similar nor rectal bleeding like this in the past.   On work-up, patient was hemodynamically stable.  Initial CBC revealed stable hemoglobin, 10.8 g/dL which is patient's baseline.  Chemistries were relatively unremarkable. No imaging performed. GI was consulted on admission and patient was made NPO in anticipation of possible CLN.    I have personally briefly reviewed patient's [facility] medical [facility] and discussed patient with the ER provider when appropriate/indicated.  Assessment/Plan: * Rectal bleeding -Per daughter, no prior history of similar.  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 - residual left hemiplegia, mild dysarthria, R eye deviation) - patient bedbound/WC bound; cared for by daughter at home; previously in SNF until about June (has been home since) - standing rec's from neurology to avoid aspirin and similar due to hx ICH.   Major depression, recurrent, chronic (HCC) - resume home meds once med rec complete  Hypertension associated with type 2 diabetes mellitus (HCC) - hold home BP regimen in setting of GIB for now - use labetalol or hydralazine PRN  Type 2 diabetes mellitus with neurological complications (HCC) - last A1c 5.7% on 07/19/19 - use SSI for now    Code Status: Full DVT Prophylaxis: SCD Anticipated disposition is to home in 2-3 days  History: Past Medical History:  Diagnosis Date   COPD 06/27/2008   Qualifier: Diagnosis of  By: Haley CottaSood MD, Haley     Hypertension    IVH (intraventricular hemorrhage) (HCC) 07/18/2019   OBSTRUCTIVE SLEEP APNEA 06/27/2008   Qualifier: Diagnosis of  By: Haley CottaSood MD, Haley     Stroke [facility](HCC)     Past Surgical History:  Procedure Laterality Date  ABDOMINAL HYSTERECTOMY     IR GASTROSTOMY TUBE MOD SED  08/03/2019   IR GASTROSTOMY TUBE REMOVAL  11/21/2019     reports that she has never smoked. She has never used smokeless tobacco. She reports previous alcohol use. She reports previous drug use.  Allergies  Allergen Reactions   Losartan Swelling   Norvasc [Amlodipine] Swelling   Ace Inhibitors      History of swelling with losartan   Penicillins     Unable to verify reactions at this time/ Tolerated ceftriaxone March 2021    Family History  Problem Relation Age of Onset   Hypertension Father    Diabetes Father    Home Medications: Prior to Admission medications   Medication Sig Start Date End Date Taking? Authorizing Provider  BACLOFEN PO Take by mouth.    [provider]  busPIRone (BUSPAR) 5 MG tablet Take 1 tablet (5 mg total) by mouth every 12 (twelve) hours. 10/19/19   Sharee Holster, NP  Cholecalciferol (D3-1000) 25 MCG (1000 UT) tablet Take 1,000 Units by mouth daily.    [provider]  gabapentin (NEURONTIN) 100 MG capsule Take 100 mg by mouth 3 (three) times daily.    [provider]  hydrALAZINE (APRESOLINE) 100 MG tablet Take 1 tablet (100 mg total) by mouth every 6 (six) hours. 10/19/19   Sharee Holster, NP  labetalol (NORMODYNE) 200 MG tablet Take 1 tablet (200 mg total) by mouth every 8 (eight) hours. 10/19/19   Sharee Holster, NP  NON FORMULARY Accu-check ac & qhs. Notify provider of results under 60 or over 400. Four Times A Day 06:30 AM, 11:30 AM, 04:30 PM, 09:00 PM 09/21/19   [provider]  NON FORMULARY Diet Change: Dys 2 (ground), thin liquids (extra gravy on the side) 09/27/19   [provider]  NON FORMULARY Flush g-tube with 240 ml free water TID. Three Times A Day 09:00 AM, 02:00 PM, 09:00 PM 09/22/19   [provider]  Nutritional Supplements (FEEDING SUPPLEMENT, GLUCERNA 1.2 CAL,) LIQD Place into feeding tube. Glucerna 1.2 by g-tube at 120 ml/hr-noctural feed from 2000 until 0600 daily. Document total intake qshift. Special Instructions: Keep HOB elevated at 45degrees during feeds/flushes. Every Shift Day, Evening, Nigh 09/29/19   [provider]    Review of Systems:  Pertinent items noted in HPI and remainder of comprehensive ROS otherwise negative.  Physical Exam: Vitals:    02/25/20 1830 02/25/20 1900 02/25/20 2000 02/25/20 2125  BP: 109/84 114/86 119/89 125/84  Pulse: 77 76 75 76  Resp: 17 18 15 18   Temp:    99.2 F (37.3 C)  TempSrc:      SpO2: 94% 95% 96% 97%  Weight:    65 kg  Height:    5\' 1"  (1.549 m)   General appearance: alert, cooperative and no distress Head: Normocephalic, without obvious abnormality, atraumatic Eyes: right eye devaited lateral at rest but does track finger; left eye movements normal Lungs: clear to auscultation bilaterally Heart: regular rate and rhythm and S1, S2 normal Abdomen: soft, ND, BS present; mild LLQ TTP no R/G Extremities: no edema Skin: mobility and turgor normal Neurologic: dense  Labs on Admission:  I have personally reviewed following labs and imaging studies Results for orders placed or performed during the hospital encounter of 02/25/20 (from the past 24 hour(s))  Comprehensive metabolic panel     Status: Abnormal   Collection Time: 02/25/20  3:19 PM  Result Value Ref Range  Sodium 139 135 - 145 mmol/L   Potassium 4.4 3.5 - 5.1 mmol/L   Chloride 102 98 - 111 mmol/L   CO2 30 22 - 32 mmol/L   Glucose, Bld 106 (H) 70 - 99 mg/dL   BUN 8 6 - 20 mg/dL   Creatinine, Ser 3.66 0.44 - 1.00 mg/dL   Calcium 29.4 8.9 - 76.5 mg/dL   Total Protein 6.6 6.5 - 8.1 g/dL   Albumin 3.7 3.5 - 5.0 g/dL   AST 15 15 - 41 U/L   ALT 14 0 - 44 U/L   Alkaline Phosphatase 48 38 - 126 U/L   Total Bilirubin 0.3 0.3 - 1.2 mg/dL   GFR, Estimated >46 >50 mL/min   Anion gap 7 5 - 15  Protime-INR     Status: None   Collection Time: 02/25/20  3:19 PM  Result Value Ref Range   Prothrombin Time 12.9 11.4 - 15.2 seconds   INR 1.0 0.8 - 1.2  Respiratory Panel by RT PCR (Flu A&B, Covid) - Nasopharyngeal Swab     Status: None   Collection Time: 02/25/20  3:19 PM   Specimen: Nasopharyngeal Swab  Result Value Ref Range   SARS Coronavirus 2 by RT PCR NEGATIVE NEGATIVE   Influenza A by PCR NEGATIVE NEGATIVE   Influenza B by PCR  NEGATIVE NEGATIVE  CBC with Differential     Status: Abnormal   Collection Time: 02/25/20  3:19 PM  Result Value Ref Range   WBC 5.9 4.0 - 10.5 K/uL   RBC 3.97 3.87 - 5.11 MIL/uL   Hemoglobin 10.8 (L) 12.0 - 15.0 g/dL   HCT 35.4 (L) 36 - 46 %   MCV 85.9 80.0 - 100.0 fL   MCH 27.2 26.0 - 34.0 pg   MCHC 31.7 30.0 - 36.0 g/dL   RDW 65.6 81.2 - 75.1 %   Platelets 223 150 - 400 K/uL   nRBC 0.0 0.0 - 0.2 %   Neutrophils Relative % 67 %   Neutro Abs 3.9 1.7 - 7.7 K/uL   Lymphocytes Relative 24 %   Lymphs Abs 1.4 0.7 - 4.0 K/uL   Monocytes Relative 7 %   Monocytes Absolute 0.4 0.1 - 1.0 K/uL   Eosinophils Relative 1 %   Eosinophils Absolute 0.1 0.0 - 0.5 K/uL   Basophils Relative 1 %   Basophils Absolute 0.1 0.0 - 0.1 K/uL   Immature Granulocytes 0 %   Abs Immature Granulocytes 0.01 0.00 - 0.07 K/uL  CBC with Differential     Status: Abnormal   Collection Time: 02/25/20  6:24 PM  Result Value Ref Range   WBC 7.4 4.0 - 10.5 K/uL   RBC 3.66 (L) 3.87 - 5.11 MIL/uL   Hemoglobin 10.1 (L) 12.0 - 15.0 g/dL   HCT 70.0 (L) 36 - 46 %   MCV 85.8 80.0 - 100.0 fL   MCH 27.6 26.0 - 34.0 pg   MCHC 32.2 30.0 - 36.0 g/dL   RDW 17.4 94.4 - 96.7 %   Platelets 205 150 - 400 K/uL   nRBC 0.0 0.0 - 0.2 %   Neutrophils Relative % 68 %   Neutro Abs 5.0 1.7 - 7.7 K/uL   Lymphocytes Relative 24 %   Lymphs Abs 1.8 0.7 - 4.0 K/uL   Monocytes Relative 6 %   Monocytes Absolute 0.5 0.1 - 1.0 K/uL   Eosinophils Relative 1 %   Eosinophils Absolute 0.1 0.0 - 0.5 K/uL   Basophils Relative 1 %  Basophils Absolute 0.1 0.0 - 0.1 K/uL   Immature Granulocytes 0 %   Abs Immature Granulocytes 0.02 0.00 - 0.07 K/uL     Radiological Exams on Admission: No results found. CT Angio Abd/Pel w/ and/or w/o    (Results Pending)    Consults called:  GI     Lewie Chamber, MD [facility] 02/25/2020, 10:26 PM

## 2020-02-25 NOTE — ED Notes (Signed)
Disregard resp; 0 on validated VS

## 2020-02-25 NOTE — ED Provider Notes (Signed)
Patient seen and examined, agree with assessment and plan by APP. Patient with bark red blood per rectum multiple times today. Hemodynamically stable, not currently having pain. Plan labs and admission.    Pollyann Savoy, MD 02/25/20 1600

## 2020-02-25 NOTE — Assessment & Plan Note (Signed)
-   resume home meds once med rec complete

## 2020-02-25 NOTE — Hospital Course (Addendum)
Haley Hernandez is a 59 yo female with PMH CVA (residual dense left hemiplegia, mild dysarthria, R eye deviation), HTN, COPD. 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 Was discharged to SNF and now has been back at home since about June and being cared for by her daughter.  She presented to [facility] after developing rectal bleeding at home.  She denied prior history of similar however cannot remember when last colonoscopy was and findings (possibly 2013 per epic history tab but report unable to be located).  Patient was transferred from [facility] to [facility] for GIB workup.  Per daughter, the patient had 3 episodes of rectal bleeding with left-sided abdominal pain today.  Daughter was called on arrival to [facility] as well and provided further collateral information. She states patient has in fact not had a CLN in the past so this may need to be further clarified. Patient remains on no blood thinners, asa, etc in setting of her strokes noted above.  She has never had hemorrhoidal bleeds or similar nor rectal bleeding like this in the past.   On work-up, patient was hemodynamically stable.  Initial CBC revealed stable hemoglobin, 10.8 g/dL which is patient's baseline.  Chemistries were relatively unremarkable. No imaging performed. GI was consulted on admission and patient was made NPO in anticipation of possible CLN.

## 2020-02-25 NOTE — ED Provider Notes (Signed)
[facility] EMERGENCY DEPARTMENT Provider Note   CSN: [id number] Arrival date & time: 02/25/20  1356     History Chief Complaint  Patient presents with  . Rectal Bleeding    Haley Hernandez is a 59 y.o. female with a past medical history of IVH March 2021, COPD, OSA, hemiaplasia of left side, status post PEG tube and tracheostomy both of which have been removed/reversed, who presents today for evaluation of 3 episodes of bright red blood per rectum.  History obtained from chart review, patient, and her family.  Patient reportedly had 3 relatively large volume episodes of bright red blood in her brief.  She has had some left-sided abdominal pain over the past day.  She normally suffers with constipation and 3 days ago had a enema by family after which she had a very large bowel movement and her pain had resolved.  She states that she has not been nauseous or vomiting in the past 2 days.  No fevers.  She does not have a known history of diverticulitis.  The PEG tube was removed 11/21/2019.  No chest, cough, or shortness of breath.  She does not have a primary care with the very low [facility], and has not seen GI in the past 3 years according to family.  No dysuria increased frequency urgency.  Her clonidine was recently changed from as needed for hypertension to scheduled and her labetalol was discontinued per family.  No NSAID use or anticoagulation.  Her daughter shows me a picture of what appears to be a brief with a large amount of dark red blood with out obvious formed stool.   HPI     Past Medical History:  Diagnosis Date  . COPD 06/27/2008   Qualifier: Diagnosis of  By: Craige Cotta MD, [place]    . Hypertension   . IVH (intraventricular hemorrhage) (HCC) 07/18/2019  . OBSTRUCTIVE SLEEP APNEA 06/27/2008   Qualifier: Diagnosis of  By: Craige Cotta MD, [place]    . Stroke [facility])     Patient Active Problem List   Diagnosis Date Noted  . Rectal bleeding 02/25/2020  . History of CVA  (cerebrovascular accident) 02/25/2020  . Oral thrush 10/10/2019  . Hypercalcemia 09/27/2019  . Normochromic normocytic anemia 09/27/2019  . Diarrhea 09/27/2019  . Diplopia 09/27/2019  . Dysphagia due to recent cerebrovascular accident (CVA) 09/22/2019  . GERD without esophagitis 09/22/2019  . 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 Hypertension associated with type 2 diabetes mellitus (HCC) 09/22/2019  . Chronic constipation 09/22/2019  . Major depression, recurrent, chronic (HCC) 09/22/2019  . 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 Status post tracheostomy (HCC)   . Tongue swelling   . Palliative care by specialist   . Goals of care, counseling/discussion   . Full code status   . Acute respiratory failure with hypoxemia (HCC) 07/23/2019  . Hypokalemia 07/23/2019  . Hypophosphatemia 07/23/2019  . IVH (intraventricular hemorrhage) (HCC) 07/18/2019  . OBSTRUCTIVE SLEEP APNEA 06/27/2008    Past Surgical History:  Procedure Laterality Date  . ABDOMINAL HYSTERECTOMY    . IR GASTROSTOMY TUBE MOD SED  08/03/2019  . IR GASTROSTOMY TUBE REMOVAL  11/21/2019     OB History   No obstetric history on file.     Family History  Problem Relation Age of Onset  . Hypertension Father   . Diabetes Father     Social History   Tobacco Use  .  Smoking status: Never Smoker  . Smokeless tobacco: Never Used  Substance Use Topics  . Alcohol use: Not Currently  . Drug use: Not Currently    Home Medications Prior to Admission medications   Medication Sig Start Date End Date Taking? Authorizing Provider  albuterol (ACCUNEB) 1.25 MG/3ML nebulizer solution Take 1 ampule by nebulization every 6 (six) hours as needed for wheezing.   Yes [provider]  albuterol (VENTOLIN HFA) 108 (90 Base) MCG/ACT inhaler Inhale 2 puffs into the lungs every 6 (six) hours as needed for wheezing or shortness  of breath.   Yes [provider]  Baclofen 5 MG TABS Take 1-2 tablets by mouth at bedtime. 02/16/20  Yes [provider]  busPIRone (BUSPAR) 5 MG tablet Take 1 tablet (5 mg total) by mouth every 12 (twelve) hours. 10/19/19  Yes Sharee Holster, NP  Cholecalciferol (D3-1000) 25 MCG (1000 UT) tablet Take 1,000 Units by mouth daily.   Yes [provider]  cloNIDine (CATAPRES) 0.1 MG tablet Take 0.1 mg by mouth daily as needed. 02/10/20  Yes [provider]  cloNIDine (CATAPRES) 0.3 MG tablet Take 1 tablet by mouth 2 (two) times daily.  02/23/20  Yes [provider]  docusate sodium (COLACE) 100 MG capsule Take 100 mg by mouth daily.   Yes [provider]  gabapentin (NEURONTIN) 100 MG capsule Take 100 mg by mouth 3 (three) times daily.   Yes [provider]  hydrALAZINE (APRESOLINE) 100 MG tablet Take 1 tablet (100 mg total) by mouth every 6 (six) hours. 10/19/19  Yes Sharee Holster, NP  labetalol (NORMODYNE) 200 MG tablet Take 1 tablet (200 mg total) by mouth every 8 (eight) hours. Patient not taking: Reported on 02/25/2020 10/19/19   Sharee Holster, NP  NON FORMULARY Flush g-tube with 240 ml free water TID. Three Times A Day 09:00 AM, 02:00 PM, 09:00 PM 09/22/19   [provider]    Allergies    Losartan, Norvasc [amlodipine], Ace inhibitors, and Penicillins  Review of Systems   Review of Systems  Constitutional: Negative for chills and fever.  HENT: Negative for congestion.   Eyes: Negative for visual disturbance.  Respiratory: Negative for cough and shortness of breath.   Cardiovascular: Negative for chest pain.  Gastrointestinal: Positive for abdominal pain and blood in stool. Negative for nausea and vomiting.       Uncontrolled rectal leakage of bright red blood  Genitourinary: Negative for dysuria and frequency.  Musculoskeletal: Negative for back pain and neck pain.  Neurological: Negative for weakness and  headaches.  Psychiatric/Behavioral: Negative for confusion.  All other systems reviewed and are negative.   Physical Exam Updated Vital Signs BP 125/84   Pulse 76   Temp 99.2 F (37.3 C)   Resp 18   Ht  (1.549 m)   Wt 65 kg   SpO2 97%   BMI 27.08 kg/m   Physical Exam Vitals and nursing note reviewed.  Constitutional:      General: She is not in acute distress.    Appearance: She is well-developed.  HENT:     Head: Normocephalic and atraumatic.  Eyes:     Conjunctiva/sclera: Conjunctivae normal.  Cardiovascular:     Rate and Rhythm: Normal rate and regular rhythm.     Pulses: Normal pulses.     Heart sounds: Normal heart sounds. No murmur heard.   Pulmonary:     Effort: Pulmonary effort is normal. No respiratory distress.  Breath sounds: Normal breath sounds.  Abdominal:     Palpations: Abdomen is soft.     Tenderness: There is abdominal tenderness. There is no guarding.     Hernia: No hernia is present.     Comments: Mild abdominal tenderness at approximately the level of the umbilicus in the left side of the abdomen  Musculoskeletal:     Cervical back: Normal range of motion and neck supple.     Right lower leg: No edema.     Left lower leg: No edema.  Skin:    General: Skin is warm and dry.  Neurological:     Mental Status: She is alert. Mental status is at baseline.     Comments: Baseline left-sided weakness.  Patient is awake and alert, answers questions appropriately.  Psychiatric:        Mood and Affect: Mood normal.        Behavior: Behavior normal.     ED Results / Procedures / Treatments   Labs (all labs ordered are listed, but only abnormal results are displayed) Labs Reviewed  COMPREHENSIVE METABOLIC PANEL - Abnormal; Notable for the following components:      Result Value   Glucose, Bld 106 (*)    All other components within normal limits  CBC WITH DIFFERENTIAL/PLATELET - Abnormal; Notable for the following components:   Hemoglobin  10.8 (*)    HCT 34.1 (*)    All other components within normal limits  CBC WITH DIFFERENTIAL/PLATELET - Abnormal; Notable for the following components:   RBC 3.66 (*)    Hemoglobin 10.1 (*)    HCT 31.4 (*)    All other components within normal limits  RESPIRATORY PANEL BY RT PCR (FLU A&B, COVID)  MRSA PCR SCREENING  PROTIME-INR  CBC WITH DIFFERENTIAL/PLATELET  CBC WITH DIFFERENTIAL/PLATELET  CBC WITH DIFFERENTIAL/PLATELET  BASIC METABOLIC PANEL  CBC WITH DIFFERENTIAL/PLATELET  MAGNESIUM  CBC WITH DIFFERENTIAL/PLATELET  LIPASE, BLOOD    EKG EKG Interpretation  Date/Time:  Saturday February 25 2020 15:29:35 EDT Ventricular Rate:  71 PR Interval:    QRS Duration: 94 QT Interval:  405 QTC Calculation: 441 R Axis:   -44 Text Interpretation: Sinus rhythm Left anterior fascicular block Probable LVH with secondary repol abnrm No significant change since last tracing Confirmed by Susy Frizzle [phone]) on 02/25/2020 3:37:00 PM   Radiology CT Angio Abd/Pel w/ and/or w/o  Result Date: 02/25/2020 CLINICAL DATA:  Lower GI bleed. EXAM: CTA ABDOMEN AND PELVIS WITHOUT AND WITH CONTRAST TECHNIQUE: Multidetector CT imaging of the abdomen and pelvis was performed using the standard protocol during bolus administration of intravenous contrast. Multiplanar reconstructed images and MIPs were obtained and reviewed to evaluate the vascular anatomy. CONTRAST:  OMNIPAQUE IOHEXOL 350 MG/ML SOLN COMPARISON:  07/31/2019 FINDINGS: VASCULAR Aorta: Unenhanced images demonstrate calcification of the aorta and iliac arteries. Bilateral intrarenal stones. Arterial phase contrast-enhanced images demonstrate patent abdominal aorta without aneurysm or dissection. No focal stenosis. Celiac: Patent without evidence of aneurysm, dissection, vasculitis or significant stenosis. SMA: Patent without evidence of aneurysm, dissection, vasculitis or significant stenosis. Renals: Single renal arteries bilaterally. No  evidence of aneurysm, dissection, or stenosis. Renal nephrograms are symmetrical. IMA: Patent without evidence of aneurysm, dissection, vasculitis or significant stenosis. Inflow: Patent without evidence of aneurysm, dissection, vasculitis or significant stenosis. Proximal Outflow: Bilateral common femoral and visualized portions of the superficial and profunda femoral arteries are patent without evidence of aneurysm, dissection, vasculitis or significant stenosis. Veins: No obvious venous abnormality within the limitations  of this arterial phase study. Review of the MIP images confirms the above findings. NON-VASCULAR Lower chest: Cardiac enlargement. Small pericardial effusion. Lung bases are clear. Hepatobiliary: No focal liver abnormality is seen. No gallstones, gallbladder wall thickening, or biliary dilatation. Pancreas: Unremarkable. No pancreatic ductal dilatation or surrounding inflammatory changes. Spleen: Normal in size without focal abnormality. Adrenals/Urinary Tract: No adrenal gland nodules. Cyst in the upper pole right kidney. Bilateral renal stones. Largest is in the lower pole right kidney and measures 9 mm in diameter. No hydronephrosis or hydroureter. Bladder is mostly decompressed with a Foley catheter in place. The catheter sits low in the bladder. Stomach/Bowel: Stomach, small bowel, and colon are not abnormally distended. No wall thickening or inflammatory changes are appreciated. Scattered stool throughout the colon. Diverticula scattered throughout the colon. No evidence of diverticulitis. No contrast extravasation is demonstrated within the bowel, providing no indication of the site of hemorrhage. There is a rectal catheter which appears to been expelled. Lymphatic: No significant lymphadenopathy. Reproductive: Status post hysterectomy. No adnexal masses. Other: No free air or free fluid in the abdomen. Abdominal wall musculature appears intact. Musculoskeletal: Degenerative changes in the  spine and hips. No destructive bone lesions. IMPRESSION: 1. No evidence of abdominal aortic aneurysm or dissection. No significant arterial stenosis. 2. No contrast extravasation is demonstrated within the bowel, providing no indication of the site of hemorrhage. 3. Cardiac enlargement with small pericardial effusion. 4. Bilateral nonobstructing intrarenal stones. 5. Foley catheter in place. The catheter sits low in the bladder. A rectal catheter appears to have been expelled. 6. Colonic diverticulosis without evidence of diverticulitis. 7. Degenerative changes in the spine and hips. 8. Aortic atherosclerosis. Aortic Atherosclerosis (ICD10-I70.0). Electronically Signed   By: Burman Nieves M.D.   On: 02/25/2020 23:31    Procedures Procedures (including critical care time)  Medications Ordered in ED Medications  0.9 %  sodium chloride infusion ( Intravenous New Bag/Given 02/25/20 1753)  sodium chloride 0.9 % bolus 250 mL (0 mLs Intravenous Stopped 02/25/20 1902)  iohexol (OMNIPAQUE) 350 MG/ML injection 100 mL (100 mLs Intravenous Contrast Given 02/25/20 2250)    ED Course  I have reviewed the triage vital signs and the nursing notes.  Pertinent labs & imaging results that were available during my care of the patient were reviewed by me and considered in my medical decision making (see chart for details).  Clinical Course as of Feb 24 2337  Sat Feb 25, 2020  1514 Went to perform rectal exam with nurse in the room.  Diaper was removed and patient has had a fourth diaper full of frank bright red blood with clots.  DRE not performed, hemorrhoids.  Occult not necessary as this is frank bright red blood.   [EH]  1628 I spoke with Dr. Sharl Ma, he is at [facility] and accepts patient for admission.  We will touch base with GI unassigned for [facility] long as patient is unassigned GI wise.     [EH]  1650 I spoke with Dr. Dulce Sellar with [facility] who states clears, does not need to be otherwise n.p.o., they  will see her tomorrow morning, may require prep tomorrow for colonoscopy and they will evaluate to see if she needs a nuclear medicine study.   [EH]    Clinical Course User Index [EH] Norman Clay   MDM Rules/Calculators/A&P  Patient is a 59 year old woman with a past medical history of hemorrhagic CVA who presents today for evaluation of multiple episodes of bright red blood per rectum.  On my exam she had multiple brief/diapers full of bright red blood with flank clots.  Here initially her hemoglobin was 10.8.  On repeat after multiple additional bowel movements of bright red blood was 10.1.  She was able to maintain her blood pressure and did not have significant tachycardia.  Type and screen was not sent as only emergent O- blood is available at this facility and [facility].  Patient has minimal abdominal tenderness.  Covid test is negative.  No history of diverticulosis on CT scan that was performed earlier this year.  No clear cause of her bleeding identified.  She does not take any blood thinning medications or antiplatelets.  I spoke with Dr. Dulce Sellarutlaw of GI who recommends clear diet, does not need to be n.p.o. and they will see patient tomorrow.  I [facility] who accepts patient for admission.  Note: Portions of this report may have been transcribed using voice recognition software. Every effort was made to ensure accuracy; however, inadvertent computerized transcription errors may be present  Final Clinical Impression(s) / ED Diagnoses Final diagnoses:  Rectal bleeding  Bright red blood per rectum    Rx / DC Orders ED Discharge Orders    None       Norman ClayHammond, Aristides Luckey W, PA-C 02/25/20 2340    Pollyann SavoySheldon, Charles B, MD 02/26/20 1259

## 2020-02-25 NOTE — ED Notes (Signed)
Pt cleaned of large amt dark red blood in brief; assisted pt's daughter with cleaning pt and changing bed.

## 2020-02-25 NOTE — Assessment & Plan Note (Signed)
-   last A1c 5.7% on 07/19/19 - use SSI for now

## 2020-02-25 NOTE — ED Notes (Signed)
Spoke with Bed Placement at [facility] regarding bed status, will call back

## 2020-02-26 ENCOUNTER — Encounter (HOSPITAL_COMMUNITY): Payer: Self-pay | Admitting: Internal Medicine

## 2020-02-26 ENCOUNTER — Inpatient Hospital Stay (HOSPITAL_COMMUNITY): Payer: 59

## 2020-02-26 LAB — CBC WITH DIFFERENTIAL/PLATELET
Abs Immature Granulocytes: 0.05 10*3/uL (ref 0.00–0.07)
Abs Immature Granulocytes: 0.07 10*3/uL (ref 0.00–0.07)
Basophils Absolute: 0.1 10*3/uL (ref 0.0–0.1)
Basophils Absolute: 0.1 10*3/uL (ref 0.0–0.1)
Basophils Relative: 1 %
Basophils Relative: 1 %
Eosinophils Absolute: 0.1 10*3/uL (ref 0.0–0.5)
Eosinophils Absolute: 0.1 10*3/uL (ref 0.0–0.5)
Eosinophils Relative: 1 %
Eosinophils Relative: 1 %
HCT: 26.9 % — ABNORMAL LOW (ref 36.0–46.0)
HCT: 28.5 % — ABNORMAL LOW (ref 36.0–46.0)
Hemoglobin: 8.3 g/dL — ABNORMAL LOW (ref 12.0–15.0)
Hemoglobin: 9.1 g/dL — ABNORMAL LOW (ref 12.0–15.0)
Immature Granulocytes: 0 %
Immature Granulocytes: 1 %
Lymphocytes Relative: 16 %
Lymphocytes Relative: 27 %
Lymphs Abs: 2.2 10*3/uL (ref 0.7–4.0)
Lymphs Abs: 2.2 10*3/uL (ref 0.7–4.0)
MCH: 28 pg (ref 26.0–34.0)
MCH: 28.1 pg (ref 26.0–34.0)
MCHC: 30.9 g/dL (ref 30.0–36.0)
MCHC: 31.9 g/dL (ref 30.0–36.0)
MCV: 87.7 fL (ref 80.0–100.0)
MCV: 91.2 fL (ref 80.0–100.0)
Monocytes Absolute: 0.6 10*3/uL (ref 0.1–1.0)
Monocytes Absolute: 0.8 10*3/uL (ref 0.1–1.0)
Monocytes Relative: 6 %
Monocytes Relative: 7 %
Neutro Abs: 10.8 10*3/uL — ABNORMAL HIGH (ref 1.7–7.7)
Neutro Abs: 5.1 10*3/uL (ref 1.7–7.7)
Neutrophils Relative %: 63 %
Neutrophils Relative %: 76 %
Platelets: 187 10*3/uL (ref 150–400)
Platelets: 225 10*3/uL (ref 150–400)
RBC: 2.95 MIL/uL — ABNORMAL LOW (ref 3.87–5.11)
RBC: 3.25 MIL/uL — ABNORMAL LOW (ref 3.87–5.11)
RDW: 14.6 % (ref 11.5–15.5)
RDW: 14.6 % (ref 11.5–15.5)
WBC: 14 10*3/uL — ABNORMAL HIGH (ref 4.0–10.5)
WBC: 8.1 10*3/uL (ref 4.0–10.5)
nRBC: 0 % (ref 0.0–0.2)
nRBC: 0 % (ref 0.0–0.2)

## 2020-02-26 LAB — MAGNESIUM: Magnesium: 2 mg/dL (ref 1.7–2.4)

## 2020-02-26 LAB — BASIC METABOLIC PANEL WITH GFR
Anion gap: 7 (ref 5–15)
BUN: 12 mg/dL (ref 6–20)
CO2: 24 mmol/L (ref 22–32)
Calcium: 9.7 mg/dL (ref 8.9–10.3)
Chloride: 108 mmol/L (ref 98–111)
Creatinine, Ser: 0.75 mg/dL (ref 0.44–1.00)
GFR, Estimated: >60 mL/min
Glucose, Bld: 127 mg/dL — ABNORMAL HIGH (ref 70–99)
Potassium: 4.4 mmol/L (ref 3.5–5.1)
Sodium: 139 mmol/L (ref 135–145)

## 2020-02-26 LAB — GLUCOSE, CAPILLARY
Glucose-Capillary: 105 mg/dL — ABNORMAL HIGH (ref 70–99)
Glucose-Capillary: 107 mg/dL — ABNORMAL HIGH (ref 70–99)
Glucose-Capillary: 110 mg/dL — ABNORMAL HIGH (ref 70–99)
Glucose-Capillary: 136 mg/dL — ABNORMAL HIGH (ref 70–99)
Glucose-Capillary: 95 mg/dL (ref 70–99)
Glucose-Capillary: 99 mg/dL (ref 70–99)

## 2020-02-26 LAB — HEMOGLOBIN AND HEMATOCRIT, BLOOD
HCT: 28.9 % — ABNORMAL LOW (ref 36.0–46.0)
Hemoglobin: 9.6 g/dL — ABNORMAL LOW (ref 12.0–15.0)

## 2020-02-26 LAB — PREPARE RBC (CROSSMATCH)

## 2020-02-26 LAB — LIPASE, BLOOD: Lipase: 31 U/L (ref 11–51)

## 2020-02-26 MED ORDER — SODIUM CHLORIDE 0.9% IV SOLUTION: Freq: Once | INTRAVENOUS | Status: AC

## 2020-02-26 MED ORDER — IOHEXOL 350 MG/ML SOLN
100.0000 mL | Freq: Once | INTRAVENOUS | Status: AC | PRN
  Administered 2020-02-26: 100 mL via INTRAVENOUS

## 2020-02-26 MED ORDER — ONDANSETRON HCL 4 MG/2ML IJ SOLN
4.0000 mg | Freq: Once | INTRAMUSCULAR | Status: AC
  Administered 2020-02-26: 4 mg via INTRAVENOUS
  Filled 2020-02-26: qty 2

## 2020-02-26 MED ORDER — TRAMADOL HCL 50 MG PO TABS
50.0000 mg | ORAL_TABLET | Freq: Once | ORAL | Status: AC
  Administered 2020-02-26: 50 mg via ORAL
  Filled 2020-02-26: qty 1

## 2020-02-26 MED ORDER — PEG 3350-KCL-NA BICARB-NACL 420 G PO SOLR
4000.0000 mL | Freq: Once | ORAL | Status: AC
  Administered 2020-02-26: 4000 mL via ORAL
  Filled 2020-02-26: qty 4000

## 2020-02-26 MED ORDER — GABAPENTIN 100 MG PO CAPS
100.0000 mg | ORAL_CAPSULE | Freq: Once | ORAL | Status: AC
  Administered 2020-02-26: 100 mg via ORAL
  Filled 2020-02-26: qty 1

## 2020-02-26 MED ORDER — SODIUM CHLORIDE 0.9 % IV SOLN: Freq: Once | INTRAVENOUS | Status: AC

## 2020-02-26 NOTE — Consult Note (Signed)
[facility] Consultation Note  Referring Provider: [facility] Primary Care Physician:  Benita Stabile, MD  Reason for Consultation:  hematochezia  HPI: Haley Hernandez is a 59 y.o. female history of hemorrhagic stroke, prior G-tube (now removed), presenting with hematochezia.  No prior episodes.  No abdominal pain.  No prior colonoscopy.  Several episodes of hematochezia, large volume, with associated hypotension.  CT angiogram yesterday, seemingly when not bleeding (or not as briskly bleeding), negative.   Past Medical History:  Diagnosis Date  . COPD 06/27/2008   Qualifier: Diagnosis of  By: Craige Cotta MD, [place]    . Diabetes mellitus without complication (HCC)   . Hypertension   . IVH (intraventricular hemorrhage) (HCC) 07/18/2019  . OBSTRUCTIVE SLEEP APNEA 06/27/2008   Qualifier: Diagnosis of  By: Craige Cotta MD, [place]    . Stroke [facility])     Past Surgical History:  Procedure Laterality Date  . ABDOMINAL HYSTERECTOMY    . IR GASTROSTOMY TUBE MOD SED  08/03/2019  . IR GASTROSTOMY TUBE REMOVAL  11/21/2019    Prior to Admission medications   Medication Sig Start Date End Date Taking? Authorizing Provider  albuterol (ACCUNEB) 1.25 MG/3ML nebulizer solution Take 1 ampule by nebulization every 6 (six) hours as needed for wheezing.   Yes [provider]  albuterol (VENTOLIN HFA) 108 (90 Base) MCG/ACT inhaler Inhale 2 puffs into the lungs every 6 (six) hours as needed for wheezing or shortness of breath.   Yes [provider]  Baclofen 5 MG TABS Take 1-2 tablets by mouth at bedtime. 02/16/20  Yes [provider]  busPIRone (BUSPAR) 5 MG tablet Take 1 tablet (5 mg total) by mouth every 12 (twelve) hours. 10/19/19  Yes Sharee Holster, NP  Cholecalciferol (D3-1000) 25 MCG (1000 UT) tablet Take 1,000 Units by mouth daily.   Yes [provider]  cloNIDine (CATAPRES) 0.1 MG tablet Take 0.1 mg by mouth daily as needed. 02/10/20  Yes [provider]   cloNIDine (CATAPRES) 0.3 MG tablet Take 1 tablet by mouth 2 (two) times daily.  02/23/20  Yes [provider]  docusate sodium (COLACE) 100 MG capsule Take 100 mg by mouth daily.   Yes [provider]  gabapentin (NEURONTIN) 100 MG capsule Take 100 mg by mouth 3 (three) times daily.   Yes [provider]  hydrALAZINE (APRESOLINE) 100 MG tablet Take 1 tablet (100 mg total) by mouth every 6 (six) hours. 10/19/19  Yes Sharee Holster, NP  labetalol (NORMODYNE) 200 MG tablet Take 1 tablet (200 mg total) by mouth every 8 (eight) hours. Patient not taking: Reported on 02/25/2020 10/19/19   Sharee Holster, NP  NON FORMULARY Flush g-tube with 240 ml free water TID. Three Times A Day 09:00 AM, 02:00 PM, 09:00 PM 09/22/19   [provider]    Current Facility-Administered Medications  Medication Dose Route Frequency Provider Last Rate Last Admin  . 0.9 %  sodium chloride infusion   Intravenous Continuous Lewie Chamber, MD 75 mL/hr at 02/26/20 0600 Rate Verify at 02/26/20 0600  . Chlorhexidine Gluconate Cloth 2 % PADS 6 each  6 each Topical Daily Lewie Chamber, MD   6 each at 02/25/20 2230  . hydrALAZINE (APRESOLINE) injection 10 mg  10 mg Intravenous Q4H PRN Lewie Chamber, MD      . insulin aspart (novoLOG) injection 0-9 Units  0-9 Units Subcutaneous Q4H Lewie Chamber, MD   1 Units at 02/26/20 0449  . labetalol (NORMODYNE) injection 10 mg  10 mg Intravenous Q4H PRN Lewie Chamber, MD      . MEDLINE mouth rinse  15 mL Mouth Rinse BID Lewie Chamber, MD   15 mL at 02/26/20 1052  . sodium chloride flush (NS) 0.9 % injection 3 mL  3 mL Intravenous Q12H Lewie Chamber, MD   3 mL at 02/26/20 1000    Allergies as of 02/25/2020 - Review Complete 02/25/2020  Allergen Reaction Noted  . Losartan Swelling 08/23/2019  . Norvasc [amlodipine] Swelling 08/23/2019  . Ace inhibitors  09/27/2019  . Penicillins      Family History  Problem Relation Age of Onset  .  Hypertension Father   . Diabetes Father     Social History   Socioeconomic History  . Marital status: Single    Spouse name: Not on file  . Number of children: Not on file  . Years of education: Not on file  . Highest education level: Not on file  Occupational History  . Not on file  Tobacco Use  . Smoking status: Never Smoker  . Smokeless tobacco: Never Used  Substance and Sexual Activity  . Alcohol use: Not Currently  . Drug use: Not Currently  . Sexual activity: Not on file  Other Topics Concern  . Not on file  Social History Narrative  . Not on file   Social Determinants of Health   Financial Resource Strain:   . Difficulty of Paying Living Expenses: Not on file  Food Insecurity:   . Worried About Programme researcher, broadcasting/film/video in the Last Year: Not on file  . Ran Out of Food in the Last Year: Not on file  Transportation Needs:   . Lack of Transportation (Medical): Not on file  . Lack of Transportation (Non-Medical): Not on file  Physical Activity:   . Days of Exercise per Week: Not on file  . Minutes of Exercise per Session: Not on file  Stress:   . Feeling of Stress : Not on file  Social Connections:   . Frequency of Communication with Friends and Family: Not on file  . Frequency of Social Gatherings with Friends and Family: Not on file  . Attends Religious Services: Not on file  . Active Member of Clubs or Organizations: Not on file  . Attends Banker Meetings: Not on file  . Marital Status: Not on file  Intimate Partner Violence:   . Fear of Current or Ex-Partner: Not on file  . Emotionally Abused: Not on file  . Physically Abused: Not on file  . Sexually Abused: Not on file    Review of Systems: As per HPI, all others negative  Physical Exam: Vital signs in last 24 hours: Temp:  [97.7 F (36.5 C)-99.2 F (37.3 C)] 97.8 F (36.6 C) (10/24 1306) Pulse Rate:  [67-132] 71 (10/24 1250) Resp:  [12-37] 21 (10/24 1250) BP: (81-140)/(60-98) 108/71  (10/24 1250) SpO2:  [94 %-100 %] 100 % (10/24 1250) Weight:  [63.5 kg-65 kg] 65 kg (10/23 2125) Last BM Date: 02/26/20 General:   Alert,  Obese, alert Head:  Normocephalic and atraumatic. Eyes:  Sclera clear, no icterus.   Conjunctiva pale Ears:  Normal auditory acuity. Nose:  No deformity, discharge,  or lesions. Mouth:  No deformity or lesions.  Oropharynx pale and dry Neck:  Thick; no masses or thyromegaly. Abdomen:  Soft, nontender and nondistended. No masses, hepatosplenomegaly or hernias noted. Normal bowel sounds, without guarding, and without rebound.     Msk:  Symmetrical without  gross deformities. Normal posture. Pulses:  Normal pulses noted. Extremities:  Without clubbing or edema. Neurologic:  Alert and  oriented x4;  grossly normal neurologically. Skin:  Intact without significant lesions or rashes. Psych:  Alert and cooperative. Normal mood and affect.   Lab Results: Recent Labs    02/25/20 1824 02/26/20 0014 02/26/20 0705  WBC 7.4 8.1 14.0*  HGB 10.1* 9.1* 8.3*  HCT 31.4* 28.5* 26.9*  PLT 205 187 225   BMET Recent Labs    02/25/20 1519 02/26/20 0705  NA 139 139  K 4.4 4.4  CL 102 108  CO2 30 24  GLUCOSE 106* 127*  BUN 8 12  CREATININE 0.68 0.75  CALCIUM 10.1 9.7   LFT Recent Labs    02/25/20 1519  PROT 6.6  ALBUMIN 3.7  AST 15  ALT 14  ALKPHOS 48  BILITOT 0.3   PT/INR Recent Labs    02/25/20 1519  LABPROT 12.9  INR 1.0    Studies/Results: CT Angio Abd/Pel w/ and/or w/o  Result Date: 02/25/2020 CLINICAL DATA:  Lower GI bleed. EXAM: CTA ABDOMEN AND PELVIS WITHOUT AND WITH CONTRAST TECHNIQUE: Multidetector CT imaging of the abdomen and pelvis was performed using the standard protocol during bolus administration of intravenous contrast. Multiplanar reconstructed images and MIPs were obtained and reviewed to evaluate the vascular anatomy. CONTRAST:  OMNIPAQUE IOHEXOL 350 MG/ML SOLN COMPARISON:  07/31/2019 FINDINGS: VASCULAR Aorta:  Unenhanced images demonstrate calcification of the aorta and iliac arteries. Bilateral intrarenal stones. Arterial phase contrast-enhanced images demonstrate patent abdominal aorta without aneurysm or dissection. No focal stenosis. Celiac: Patent without evidence of aneurysm, dissection, vasculitis or significant stenosis. SMA: Patent without evidence of aneurysm, dissection, vasculitis or significant stenosis. Renals: Single renal arteries bilaterally. No evidence of aneurysm, dissection, or stenosis. Renal nephrograms are symmetrical. IMA: Patent without evidence of aneurysm, dissection, vasculitis or significant stenosis. Inflow: Patent without evidence of aneurysm, dissection, vasculitis or significant stenosis. Proximal Outflow: Bilateral common femoral and visualized portions of the superficial and profunda femoral arteries are patent without evidence of aneurysm, dissection, vasculitis or significant stenosis. Veins: No obvious venous abnormality within the limitations of this arterial phase study. Review of the MIP images confirms the above findings. NON-VASCULAR Lower chest: Cardiac enlargement. Small pericardial effusion. Lung bases are clear. Hepatobiliary: No focal liver abnormality is seen. No gallstones, gallbladder wall thickening, or biliary dilatation. Pancreas: Unremarkable. No pancreatic ductal dilatation or surrounding inflammatory changes. Spleen: Normal in size without focal abnormality. Adrenals/Urinary Tract: No adrenal gland nodules. Cyst in the upper pole right kidney. Bilateral renal stones. Largest is in the lower pole right kidney and measures 9 mm in diameter. No hydronephrosis or hydroureter. Bladder is mostly decompressed with a Foley catheter in place. The catheter sits low in the bladder. Stomach/Bowel: Stomach, small bowel, and colon are not abnormally distended. No wall thickening or inflammatory changes are appreciated. Scattered stool throughout the colon. Diverticula scattered  throughout the colon. No evidence of diverticulitis. No contrast extravasation is demonstrated within the bowel, providing no indication of the site of hemorrhage. There is a rectal catheter which appears to been expelled. Lymphatic: No significant lymphadenopathy. Reproductive: Status post hysterectomy. No adnexal masses. Other: No free air or free fluid in the abdomen. Abdominal wall musculature appears intact. Musculoskeletal: Degenerative changes in the spine and hips. No destructive bone lesions. IMPRESSION: 1. No evidence of abdominal aortic aneurysm or dissection. No significant arterial stenosis. 2. No contrast extravasation is demonstrated within the bowel, providing no indication of  the site of hemorrhage. 3. Cardiac enlargement with small pericardial effusion. 4. Bilateral nonobstructing intrarenal stones. 5. Foley catheter in place. The catheter sits low in the bladder. A rectal catheter appears to have been expelled. 6. Colonic diverticulosis without evidence of diverticulitis. 7. Degenerative changes in the spine and hips. 8. Aortic atherosclerosis. Aortic Atherosclerosis (ICD10-I70.0). Electronically Signed   By: Burman NievesWilliam  Stevens M.D.   On: 02/25/2020 23:31    Impression:  1.  Hematochezia, with hypotension.  Normal BUN.  Suspect lower GI bleeding. 2.  Acute blood loss anemia. 3.  Multiple medical problems.  Plan:  1.  Expedited CT angiogram (study was negative yesterday, but she is overtly bleeding now, does not sound like she was actively bleeding yesterday when she had the first exam). 2.  Supportive management (volume repletion, transfusion). 3.  If CT angiogram again negative and blood in stool persists, would need to consider colonoscopy this admission as next step in management. 4.  [facility] 5.  [facility] will follow.    LOS: 1 day   Olive Motyka M  02/26/2020, 1:09 PM  Cell [phone] If no answer or after 5 PM call [phone]

## 2020-02-26 NOTE — Progress Notes (Signed)
After report Nurse Tech notified RN of bloody BM. MD made aware, new orders to be placed. Will continue to monitor

## 2020-02-26 NOTE — Plan of Care (Signed)
CTA negative a second time for bleeding localization.  Perhaps some of blood she's passing is old blood.  Will start slow prep, both to help clear out any old blood and in anticipation of colonoscopy in the next couple days.

## 2020-02-26 NOTE — Progress Notes (Signed)
PROGRESS NOTE    Haley Hernandez  HAL:[id number] DOB: 1960/06/20 DOA: 02/25/2020 PCP: Benita Stabile, MD    Brief Narrative:-year-old female lives at home with her daughter had intraventricular hemorrhage in March 2021 with left-sided hemiplegia with history of COPD obstructive sleep apnea has had a PEG and a trach, she was brought in by the daughter to [facility] with 3 episodes of bright red bleeding per rectum.  Since coming to [facility] patient has had 5-6 large bloody bowel movements.  She is not on any anticoagulation at home.  Assessment & Plan:   Principal Problem:   Rectal bleeding Active Problems:   Type 2 diabetes mellitus with neurological complications (HCC)   Hypertension associated with type 2 diabetes mellitus (HCC)   Major depression, recurrent, chronic (HCC)   History of CVA (cerebrovascular accident)   #1 lower GI bleed patient admitted with multiple episodes of frank bright red bleeding per rectum.  She has not had a colonoscopy in the past.  She is not on blood thinners at home. [facility] consulted. Her hemoglobin 10.8 on arrival to med [facility] ER, it is down to 8.3 today.  2 units of blood transfusion ordered. CT angiogram of the abdomen stat ordered. Patient is becoming more hypotensive with blood pressure 81/61. We will bolus her with normal saline.  #2 history of hypertension on Catapres and hydralazine and labetalol at home hold all  Antihypertensives.  Please note that Norvasc and losartan was stopped during her last hospital stay as this was thought to be a potential etiology for angioedema of her tongue.  #3 miscellaneous-she is on baclofen BuSpar vitamin D3 Colace Neurontin which is on hold since she is n.p.o.  Estimated body mass index is 27.08 kg/m as calculated from the following:   Height as of this encounter: 5\' 1"  (1.549 m).   Weight as of this encounter: 65 kg.  DVT prophylaxis: SCD  code Status full code Family  Communication: Will discuss with her daughter Disposition Plan:  Status is: Inpatient  Dispo: The patient is from: Home              Anticipated d/c is to: Home              Anticipated d/c date is: > 3 days              Patient currently is not medically stable to d/c.   Consultants: GI  Procedures: None Antimicrobials: None  Subjective: Patient resting in bed she is awake and alert.  While I was in the room she had a large bloody bowel movement.  She denies any nausea vomiting or abdominal pain.  Reviewed her old chart-note that amlodipine and losartan were stopped during her last hospital stay thinking that could have caused angioedema of her tongue. Objective: Vitals:   02/26/20 0612 02/26/20 0829 02/26/20 1015 02/26/20 1020  BP: 101/71   (!) 81/61  Pulse: (!) 132  76 75  Resp: 14  13 19   Temp:  98.1 F (36.7 C)  97.9 F (36.6 C)  TempSrc:  Oral  Oral  SpO2: 99%  98% 98%  Weight:      Height:        Intake/Output Summary (Last 24 hours) at 02/26/2020 1042 Last data filed at 02/26/2020 0600 Gross per 24 hour  Intake 798.29 ml  Output 200 ml  Net 598.29 ml   Filed Weights   02/25/20 1407 02/25/20 2125  Weight: 63.5 kg  65 kg    Examination:  General exam: She is awake she does not appear to be in any distress Respiratory system: Clear to auscultation. Respiratory effort normal. Cardiovascular system: S1 & S2 heard, RRR. No JVD, murmurs, rubs, gallops or clicks. No pedal edema. Gastrointestinal system: Abdomen is nondistended, soft and nontender. No organomegaly or masses felt. Normal bowel sounds heard. Central nervous system: Left upper and lower extremity hemiplegia noted  extremities: Symmetric 5 x 5 power. Skin: No rashes, lesions or ulcers Psychiatry: Judgement and insight appear normal. Mood & affect appropriate.     Data Reviewed: I have personally reviewed following labs and imaging studies  CBC: Recent Labs  Lab 02/25/20 1519 02/25/20 1824  02/26/20 0014 02/26/20 0705  WBC 5.9 7.4 8.1 14.0*  NEUTROABS 3.9 5.0 5.1 10.8*  HGB 10.8* 10.1* 9.1* 8.3*  HCT 34.1* 31.4* 28.5* 26.9*  MCV 85.9 85.8 87.7 91.2  PLT 223 205 187 225   Basic Metabolic Panel: Recent Labs  Lab 02/25/20 1519 02/26/20 0705  NA 139 139  K 4.4 4.4  CL 102 108  CO2 30 24  GLUCOSE 106* 127*  BUN 8 12  CREATININE 0.68 0.75  CALCIUM 10.1 9.7  MG  --  2.0   GFR: Estimated Creatinine Clearance: 66.2 mL/min (by C-G formula based on SCr of 0.75 mg/dL). Liver Function Tests: Recent Labs  Lab 02/25/20 1519  AST 15  ALT 14  ALKPHOS 48  BILITOT 0.3  PROT 6.6  ALBUMIN 3.7   Recent Labs  Lab 02/26/20 0705  LIPASE 31   No results for input(s): AMMONIA in the last 168 hours. Coagulation Profile: Recent Labs  Lab 02/25/20 1519  INR 1.0   Cardiac Enzymes: No results for input(s): CKTOTAL, CKMB, CKMBINDEX, TROPONINI in the last 168 hours. BNP (last 3 results) No results for input(s): PROBNP in the last 8760 hours. HbA1C: No results for input(s): HGBA1C in the last 72 hours. CBG: Recent Labs  Lab 02/26/20 0002 02/26/20 0434 02/26/20 0736  GLUCAP 105* 136* 107*   Lipid Profile: No results for input(s): CHOL, HDL, LDLCALC, TRIG, CHOLHDL, LDLDIRECT in the last 72 hours. Thyroid Function Tests: No results for input(s): TSH, T4TOTAL, FREET4, T3FREE, THYROIDAB in the last 72 hours. Anemia Panel: No results for input(s): VITAMINB12, FOLATE, FERRITIN, TIBC, IRON, RETICCTPCT in the last 72 hours. Sepsis Labs: No results for input(s): PROCALCITON, LATICACIDVEN in the last 168 hours.  Recent Results (from the past 240 hour(s))  Respiratory Panel by RT PCR (Flu A&B, Covid) - Nasopharyngeal Swab     Status: None   Collection Time: 02/25/20  3:19 PM   Specimen: Nasopharyngeal Swab  Result Value Ref Range Status   SARS Coronavirus 2 by RT PCR NEGATIVE NEGATIVE Final    Comment: (NOTE) SARS-CoV-2 target nucleic acids are NOT DETECTED.  The  SARS-CoV-2 RNA is generally detectable in upper respiratoy specimens during the acute phase of infection. The lowest concentration of SARS-CoV-2 viral copies this assay can detect is 131 copies/mL. A negative result does not preclude SARS-Cov-2 infection and should not be used as the sole basis for treatment or other patient management decisions. A negative result may occur with  improper specimen collection/handling, submission of specimen other than nasopharyngeal swab, presence of viral mutation(s) within the areas targeted by this assay, and inadequate number of viral copies (<131 copies/mL). A negative result must be combined with clinical observations, patient history, and epidemiological information. The expected result is Negative.  Fact Sheet for Patients:  https://www.moore.com/https://www.fda.gov/media/142436/download  Fact Sheet for Healthcare Providers:  https://www.young.biz/https://www.fda.gov/media/142435/download  This test is no t yet approved or cleared by the [place] FDA and  has been authorized for detection and/or diagnosis of SARS-CoV-2 by FDA under an Emergency Use Authorization (EUA). This EUA will remain  in effect (meaning this test can be used) for the duration of the COVID-19 declaration under Section 564(b)(1) of the Act, 21 U.S.C. section 360bbb-3(b)(1), unless the authorization is terminated or revoked sooner.     Influenza A by PCR NEGATIVE NEGATIVE Final   Influenza B by PCR NEGATIVE NEGATIVE Final    Comment: (NOTE) The Xpert Xpress SARS-CoV-2/FLU/RSV assay is intended as an aid in  the diagnosis of influenza from Nasopharyngeal swab specimens and  should not be used as a sole basis for treatment. Nasal washings and  aspirates are unacceptable for Xpert Xpress SARS-CoV-2/FLU/RSV  testing.  Fact Sheet for Patients: https://www.moore.com/https://www.fda.gov/media/142436/download  Fact Sheet for Healthcare Providers: https://www.young.biz/https://www.fda.gov/media/142435/download  This test is not yet approved or cleared  by the [place] FDA and  has been authorized for detection and/or diagnosis of SARS-CoV-2 by  FDA under an Emergency Use Authorization (EUA). This EUA will remain  in effect (meaning this test can be used) for the duration of the  Covid-19 declaration under Section 564(b)(1) of the Act, 21  U.S.C. section 360bbb-3(b)(1), unless the authorization is  terminated or revoked. Performed at [facility] [place], [address] [place], [place] [address]   MRSA PCR Screening     Status: None   Collection Time: 02/25/20  9:25 PM   Specimen: Nasal Mucosa; Nasopharyngeal  Result Value Ref Range Status   MRSA by PCR NEGATIVE NEGATIVE Final    Comment:        The GeneXpert MRSA Assay (FDA approved for NASAL specimens only), is one component of a comprehensive MRSA colonization surveillance program. It is not intended to diagnose MRSA infection nor to guide or monitor treatment for MRSA infections. Performed at [facility], [address] [place], [place] [address]          Radiology Studies: CT Angio Abd/Pel w/ and/or w/o  Result Date: 02/25/2020 CLINICAL DATA:  Lower GI bleed. EXAM: CTA ABDOMEN AND PELVIS WITHOUT AND WITH CONTRAST TECHNIQUE: Multidetector CT imaging of the abdomen and pelvis was performed using the standard protocol during bolus administration of intravenous contrast. Multiplanar reconstructed images and MIPs were obtained and reviewed to evaluate the vascular anatomy. CONTRAST:  100mL OMNIPAQUE IOHEXOL 350 MG/ML SOLN COMPARISON:  07/31/2019 FINDINGS: VASCULAR Aorta: Unenhanced images demonstrate calcification of the aorta and iliac arteries. Bilateral intrarenal stones. Arterial phase contrast-enhanced images demonstrate patent abdominal aorta without aneurysm or dissection. No focal stenosis. Celiac: Patent without evidence of aneurysm, dissection, vasculitis or significant stenosis. SMA: Patent without evidence of aneurysm, dissection,  vasculitis or significant stenosis. Renals: Single renal arteries bilaterally. No evidence of aneurysm, dissection, or stenosis. Renal nephrograms are symmetrical. IMA: Patent without evidence of aneurysm, dissection, vasculitis or significant stenosis. Inflow: Patent without evidence of aneurysm, dissection, vasculitis or significant stenosis. Proximal Outflow: Bilateral common femoral and visualized portions of the superficial and profunda femoral arteries are patent without evidence of aneurysm, dissection, vasculitis or significant stenosis. Veins: No obvious venous abnormality within the limitations of this arterial phase study. Review of the MIP images confirms the above findings. NON-VASCULAR Lower chest: Cardiac enlargement. Small pericardial effusion. Lung bases are clear. Hepatobiliary: No focal liver abnormality is seen. No gallstones, gallbladder wall thickening, or  biliary dilatation. Pancreas: Unremarkable. No pancreatic ductal dilatation or surrounding inflammatory changes. Spleen: Normal in size without focal abnormality. Adrenals/Urinary Tract: No adrenal gland nodules. Cyst in the upper pole right kidney. Bilateral renal stones. Largest is in the lower pole right kidney and measures 9 mm in diameter. No hydronephrosis or hydroureter. Bladder is mostly decompressed with a Foley catheter in place. The catheter sits low in the bladder. Stomach/Bowel: Stomach, small bowel, and colon are not abnormally distended. No wall thickening or inflammatory changes are appreciated. Scattered stool throughout the colon. Diverticula scattered throughout the colon. No evidence of diverticulitis. No contrast extravasation is demonstrated within the bowel, providing no indication of the site of hemorrhage. There is a rectal catheter which appears to been expelled. Lymphatic: No significant lymphadenopathy. Reproductive: Status post hysterectomy. No adnexal masses. Other: No free air or free fluid in the abdomen.  Abdominal wall musculature appears intact. Musculoskeletal: Degenerative changes in the spine and hips. No destructive bone lesions. IMPRESSION: 1. No evidence of abdominal aortic aneurysm or dissection. No significant arterial stenosis. 2. No contrast extravasation is demonstrated within the bowel, providing no indication of the site of hemorrhage. 3. Cardiac enlargement with small pericardial effusion. 4. Bilateral nonobstructing intrarenal stones. 5. Foley catheter in place. The catheter sits low in the bladder. A rectal catheter appears to have been expelled. 6. Colonic diverticulosis without evidence of diverticulitis. 7. Degenerative changes in the spine and hips. 8. Aortic atherosclerosis. Aortic Atherosclerosis (ICD10-I70.0). Electronically Signed   By: Burman Nieves M.D.   On: 02/25/2020 23:31        Scheduled Meds: . sodium chloride   Intravenous Once  . Chlorhexidine Gluconate Cloth  6 each Topical Daily  . insulin aspart  0-9 Units Subcutaneous Q4H  . mouth rinse  15 mL Mouth Rinse BID  . sodium chloride flush  3 mL Intravenous Q12H   Continuous Infusions: . sodium chloride 75 mL/hr at 02/26/20 0600     LOS: 1 day     Alwyn Ren, MD 02/26/2020, 10:42 AM

## 2020-02-27 LAB — BASIC METABOLIC PANEL
Anion gap: 7 (ref 5–15)
BUN: 10 mg/dL (ref 6–20)
CO2: 26 mmol/L (ref 22–32)
Calcium: 9.7 mg/dL (ref 8.9–10.3)
Chloride: 112 mmol/L — ABNORMAL HIGH (ref 98–111)
Creatinine, Ser: 0.72 mg/dL (ref 0.44–1.00)
GFR, Estimated: >60 mL/min (ref >60)
Glucose, Bld: 103 mg/dL — ABNORMAL HIGH (ref 70–99)
Potassium: 4.1 mmol/L (ref 3.5–5.1)
Sodium: 145 mmol/L (ref 135–145)

## 2020-02-27 LAB — CBC WITH DIFFERENTIAL/PLATELET
Abs Immature Granulocytes: 0.03 10*3/uL (ref 0.00–0.07)
Abs Immature Granulocytes: 0.04 10*3/uL (ref 0.00–0.07)
Abs Immature Granulocytes: 0.06 10*3/uL (ref 0.00–0.07)
Basophils Absolute: 0 10*3/uL (ref 0.0–0.1)
Basophils Absolute: 0 10*3/uL (ref 0.0–0.1)
Basophils Absolute: 0.1 10*3/uL (ref 0.0–0.1)
Basophils Relative: 1 %
Basophils Relative: 1 %
Basophils Relative: 1 %
Eosinophils Absolute: 0.1 10*3/uL (ref 0.0–0.5)
Eosinophils Absolute: 0.1 10*3/uL (ref 0.0–0.5)
Eosinophils Absolute: 0.2 10*3/uL (ref 0.0–0.5)
Eosinophils Relative: 1 %
Eosinophils Relative: 2 %
Eosinophils Relative: 2 %
HCT: 23.9 % — ABNORMAL LOW (ref 36.0–46.0)
HCT: 25 % — ABNORMAL LOW (ref 36.0–46.0)
HCT: 26.7 % — ABNORMAL LOW (ref 36.0–46.0)
Hemoglobin: 7.5 g/dL — ABNORMAL LOW (ref 12.0–15.0)
Hemoglobin: 8.5 g/dL — ABNORMAL LOW (ref 12.0–15.0)
Hemoglobin: 8.9 g/dL — ABNORMAL LOW (ref 12.0–15.0)
Immature Granulocytes: 1 %
Immature Granulocytes: 1 %
Immature Granulocytes: 1 %
Lymphocytes Relative: 25 %
Lymphocytes Relative: 27 %
Lymphocytes Relative: 30 %
Lymphs Abs: 1.6 10*3/uL (ref 0.7–4.0)
Lymphs Abs: 1.9 10*3/uL (ref 0.7–4.0)
Lymphs Abs: 2.4 10*3/uL (ref 0.7–4.0)
MCH: 28.5 pg (ref 26.0–34.0)
MCH: 29.7 pg (ref 26.0–34.0)
MCH: 29.8 pg (ref 26.0–34.0)
MCHC: 31.4 g/dL (ref 30.0–36.0)
MCHC: 33.3 g/dL (ref 30.0–36.0)
MCHC: 34 g/dL (ref 30.0–36.0)
MCV: 87.7 fL (ref 80.0–100.0)
MCV: 89 fL (ref 80.0–100.0)
MCV: 90.9 fL (ref 80.0–100.0)
Monocytes Absolute: 0.5 10*3/uL (ref 0.1–1.0)
Monocytes Absolute: 0.6 10*3/uL (ref 0.1–1.0)
Monocytes Absolute: 0.6 10*3/uL (ref 0.1–1.0)
Monocytes Relative: 7 %
Monocytes Relative: 8 %
Monocytes Relative: 8 %
Neutro Abs: 4.2 10*3/uL (ref 1.7–7.7)
Neutro Abs: 4.4 10*3/uL (ref 1.7–7.7)
Neutro Abs: 4.8 10*3/uL (ref 1.7–7.7)
Neutrophils Relative %: 59 %
Neutrophils Relative %: 61 %
Neutrophils Relative %: 64 %
Platelets: 129 10*3/uL — ABNORMAL LOW (ref 150–400)
Platelets: 161 10*3/uL (ref 150–400)
Platelets: 65 10*3/uL — ABNORMAL LOW (ref 150–400)
RBC: 2.63 MIL/uL — ABNORMAL LOW (ref 3.87–5.11)
RBC: 2.85 MIL/uL — ABNORMAL LOW (ref 3.87–5.11)
RBC: 3 MIL/uL — ABNORMAL LOW (ref 3.87–5.11)
RDW: 14.1 % (ref 11.5–15.5)
RDW: 14.2 % (ref 11.5–15.5)
RDW: 14.6 % (ref 11.5–15.5)
WBC: 6.4 10*3/uL (ref 4.0–10.5)
WBC: 7.2 10*3/uL (ref 4.0–10.5)
WBC: 8 10*3/uL (ref 4.0–10.5)
nRBC: 0 % (ref 0.0–0.2)
nRBC: 0 % (ref 0.0–0.2)
nRBC: 0 % (ref 0.0–0.2)

## 2020-02-27 LAB — GLUCOSE, CAPILLARY
Glucose-Capillary: 100 mg/dL — ABNORMAL HIGH (ref 70–99)
Glucose-Capillary: 101 mg/dL — ABNORMAL HIGH (ref 70–99)
Glucose-Capillary: 101 mg/dL — ABNORMAL HIGH (ref 70–99)
Glucose-Capillary: 101 mg/dL — ABNORMAL HIGH (ref 70–99)
Glucose-Capillary: 84 mg/dL (ref 70–99)
Glucose-Capillary: 92 mg/dL (ref 70–99)
Glucose-Capillary: 93 mg/dL (ref 70–99)

## 2020-02-27 LAB — HEMOGLOBIN AND HEMATOCRIT, BLOOD
HCT: 21.8 % — ABNORMAL LOW (ref 36.0–46.0)
Hemoglobin: 7.6 g/dL — ABNORMAL LOW (ref 12.0–15.0)

## 2020-02-27 LAB — MAGNESIUM: Magnesium: 1.8 mg/dL (ref 1.7–2.4)

## 2020-02-27 LAB — PREPARE RBC (CROSSMATCH)

## 2020-02-27 MED ORDER — SODIUM CHLORIDE 0.9% IV SOLUTION: Freq: Once | INTRAVENOUS | Status: DC

## 2020-02-27 MED ORDER — FENTANYL CITRATE (PF) 100 MCG/2ML IJ SOLN
25.0000 ug | Freq: Once | INTRAMUSCULAR | Status: AC
  Administered 2020-02-27: 25 ug via INTRAVENOUS
  Filled 2020-02-27: qty 2

## 2020-02-27 MED ORDER — LABETALOL HCL 5 MG/ML IV SOLN
10.0000 mg | INTRAVENOUS | Status: AC | PRN
  Administered 2020-02-27 (×2): 10 mg via INTRAVENOUS
  Filled 2020-02-27: qty 4

## 2020-02-27 MED ORDER — PEG 3350-KCL-NA BICARB-NACL 420 G PO SOLR
4000.0000 mL | Freq: Once | ORAL | Status: AC
  Administered 2020-02-27: 4000 mL via ORAL
  Filled 2020-02-27: qty 4000

## 2020-02-27 NOTE — Progress Notes (Signed)
Patient passing large amounts of bloody stools with small clots every hour since shift change. MD notified STAT H&H ordered. Hgb 7.6, MD notified, q6h H&H ordered. Will continue to monitor.

## 2020-02-27 NOTE — Progress Notes (Signed)
PROGRESS NOTE    Haley Hernandez  HXT:[id number] DOB: [birth year] DOA: 02/25/2020 PCP: Benita Stabile, MD    Brief Narrative:-year-old female lives at home with her daughter had intraventricular hemorrhage in March 2021 with left-sided hemiplegia with history of COPD obstructive sleep apnea has had a PEG and a trach, she was brought in by the daughter to [facility] with 3 episodes of bright red bleeding per rectum.  Since coming to [facility] patient has had 5-6 large bloody bowel movements.  She is not on any anticoagulation at home.  Assessment & Plan:   Principal Problem:   Rectal bleeding Active Problems:   Type 2 diabetes mellitus with neurological complications (HCC)   Hypertension associated with type 2 diabetes mellitus (HCC)   Major depression, recurrent, chronic (HCC)   History of CVA (cerebrovascular accident)   #1 lower GI bleed patient admitted with multiple episodes of frank bright red bleeding per rectum.  She has not had a colonoscopy in the past.  She is not on blood thinners at home. [facility] following. Her hemoglobin 10.8 on arrival to [facility] ER, it is down to 7.5 today in spite of receiving 2 units of blood transfusion yesterday.  Will order another unit of blood transfusion today. CT angiogram of the abdomen x2 with no evidence of active bleeding.  Colonoscopy tomorrow after complete colon prep.  Patient continues with passing blood with clots. Follow H&H every 6 and transfuse to keep hemoglobin above 7.  #2 history of hypertension-blood pressure 136/83 on Catapres and hydralazine and labetalol at home hold all  Antihypertensives.  Please note that Norvasc and losartan was stopped during her last hospital stay as this was thought to be a potential etiology for angioedema of her tongue.  #3 miscellaneous-she is on baclofen BuSpar vitamin D3 Colace Neurontin which is on hold since she is n.p.o.  Estimated body mass index is 27.08 kg/m as  calculated from the following:   Height as of this encounter: 5\' 1"  (1.549 m).   Weight as of this encounter: 65 kg.  DVT prophylaxis: SCD  code Status full code Family Communication: Will discuss with her daughter Disposition Plan:  Status is: Inpatient  Dispo: The patient is from: Home              Anticipated d/c is to: Home              Anticipated d/c date is: > 3 days              Patient currently is not medically stable to d/c.   Consultants: GI  Procedures: None Antimicrobials: None  Subjective: Patient is resting in bed no family at bedside today.  She drank three fourths of the colon prep. Staff reports she is passing blood and clots. Reviewed her old chart-note that amlodipine and losartan were stopped during her last hospital stay thinking that could have caused angioedema of her tongue. Objective: Vitals:   02/27/20 1000 02/27/20 1015 02/27/20 1030 02/27/20 1045  BP: (!) 145/74 (!) 156/93 (!) 142/86 136/83  Pulse: 97 99 90 94  Resp: 12 12 12  (!) 9  Temp:   98.5 F (36.9 C) 98.6 F (37 C)  TempSrc:   Oral Oral  SpO2: 95% 94% 93% 96%  Weight:      Height:        Intake/Output Summary (Last 24 hours) at 02/27/2020 1143 Last data filed at 02/27/2020 1030 Gross per 24 hour  Intake 5165.28 ml  Output 400 ml  Net 4765.28 ml   Filed Weights   02/25/20 1407 02/25/20 2125  Weight: 63.5 kg 65 kg    Examination:  General exam: She is awake she does not appear to be in any distress Respiratory system: Clear to auscultation. Respiratory effort normal. Cardiovascular system: S1 & S2 heard, RRR. No JVD, murmurs, rubs, gallops or clicks. No pedal edema. Gastrointestinal system: Abdomen is nondistended, soft and nontender. No organomegaly or masses felt. Normal bowel sounds heard. Central nervous system: Left upper and lower extremity hemiplegia noted  extremities: Symmetric 5 x 5 power. Skin: No rashes, lesions or ulcers Psychiatry: Judgement and insight appear  normal. Mood & affect appropriate.     Data Reviewed: I have personally reviewed following labs and imaging studies  CBC: Recent Labs  Lab 02/25/20 1519 02/25/20 1519 02/25/20 1824 02/25/20 1824 02/26/20 0014 02/26/20 0705 02/26/20 1425 02/26/20 2349 02/27/20 0331  WBC 5.9  --  7.4  --  8.1 14.0*  --   --  8.0  NEUTROABS 3.9  --  5.0  --  5.1 10.8*  --   --  4.8  HGB 10.8*   < > 10.1*   < > 9.1* 8.3* 9.6* 7.6* 7.5*  HCT 34.1*   < > 31.4*   < > 28.5* 26.9* 28.9* 21.8* 23.9*  MCV 85.9  --  85.8  --  87.7 91.2  --   --  90.9  PLT 223  --  205  --  187 225  --   --  161   < > = values in this interval not displayed.   Basic Metabolic Panel: Recent Labs  Lab 02/25/20 1519 02/26/20 0705 02/27/20 0331  NA 139 139 145  K 4.4 4.4 4.1  CL 102 108 112*  CO2 30 24 26   GLUCOSE 106* 127* 103*  BUN 8 12 10   CREATININE 0.68 0.75 0.72  CALCIUM 10.1 9.7 9.7  MG  --  2.0 1.8   GFR: Estimated Creatinine Clearance: 66.2 mL/min (by C-G formula based on SCr of 0.72 mg/dL). Liver Function Tests: Recent Labs  Lab 02/25/20 1519  AST 15  ALT 14  ALKPHOS 48  BILITOT 0.3  PROT 6.6  ALBUMIN 3.7   Recent Labs  Lab 02/26/20 0705  LIPASE 31   No results for input(s): AMMONIA in the last 168 hours. Coagulation Profile: Recent Labs  Lab 02/25/20 1519  INR 1.0   Cardiac Enzymes: No results for input(s): CKTOTAL, CKMB, CKMBINDEX, TROPONINI in the last 168 hours. BNP (last 3 results) No results for input(s): PROBNP in the last 8760 hours. HbA1C: No results for input(s): HGBA1C in the last 72 hours. CBG: Recent Labs  Lab 02/26/20 1601 02/26/20 2003 02/27/20 0017 02/27/20 0425 02/27/20 0746  GLUCAP 99 110* 100* 101* 93   Lipid Profile: No results for input(s): CHOL, HDL, LDLCALC, TRIG, CHOLHDL, LDLDIRECT in the last 72 hours. Thyroid Function Tests: No results for input(s): TSH, T4TOTAL, FREET4, T3FREE, THYROIDAB in the last 72 hours. Anemia Panel: No results for  input(s): VITAMINB12, FOLATE, FERRITIN, TIBC, IRON, RETICCTPCT in the last 72 hours. Sepsis Labs: No results for input(s): PROCALCITON, LATICACIDVEN in the last 168 hours.  Recent Results (from the past 240 hour(s))  Respiratory Panel by RT PCR (Flu A&B, Covid) - Nasopharyngeal Swab     Status: None   Collection Time: 02/25/20  3:19 PM   Specimen: Nasopharyngeal Swab  Result Value Ref Range Status  SARS Coronavirus 2 by RT PCR NEGATIVE NEGATIVE Final    Comment: (NOTE) SARS-CoV-2 target nucleic acids are NOT DETECTED.  The SARS-CoV-2 RNA is generally detectable in upper respiratoy specimens during the acute phase of infection. The lowest concentration of SARS-CoV-2 viral copies this assay can detect is 131 copies/mL. A negative result does not preclude SARS-Cov-2 infection and should not be used as the sole basis for treatment or other patient management decisions. A negative result may occur with  improper specimen collection/handling, submission of specimen other than nasopharyngeal swab, presence of viral mutation(s) within the areas targeted by this assay, and inadequate number of viral copies (<131 copies/mL). A negative result must be combined with clinical observations, patient history, and epidemiological information. The expected result is Negative.  Fact Sheet for Patients:  https://www.moore.com/https://www.fda.gov/media/142436/download  Fact Sheet for Healthcare Providers:  https://www.young.biz/https://www.fda.gov/media/142435/download  This test is no t yet approved or cleared by the [place] FDA and  has been authorized for detection and/or diagnosis of SARS-CoV-2 by FDA under an Emergency Use Authorization (EUA). This EUA will remain  in effect (meaning this test can be used) for the duration of the COVID-19 declaration under Section 564(b)(1) of the Act, 21 U.S.C. section 360bbb-3(b)(1), unless the authorization is terminated or revoked sooner.     Influenza A by PCR NEGATIVE NEGATIVE Final    Influenza B by PCR NEGATIVE NEGATIVE Final    Comment: (NOTE) The Xpert Xpress SARS-CoV-2/FLU/RSV assay is intended as an aid in  the diagnosis of influenza from Nasopharyngeal swab specimens and  should not be used as a sole basis for treatment. Nasal washings and  aspirates are unacceptable for Xpert Xpress SARS-CoV-2/FLU/RSV  testing.  Fact Sheet for Patients: https://www.moore.com/https://www.fda.gov/media/142436/download  Fact Sheet for Healthcare Providers: https://www.young.biz/https://www.fda.gov/media/142435/download  This test is not yet approved or cleared by the [place] FDA and  has been authorized for detection and/or diagnosis of SARS-CoV-2 by  FDA under an Emergency Use Authorization (EUA). This EUA will remain  in effect (meaning this test can be used) for the duration of the  Covid-19 declaration under Section 564(b)(1) of the Act, 21  U.S.C. section 360bbb-3(b)(1), unless the authorization is  terminated or revoked. Performed at [facility] [place], [address] [place], [place] [address]   MRSA PCR Screening     Status: None   Collection Time: 02/25/20  9:25 PM   Specimen: Nasal Mucosa; Nasopharyngeal  Result Value Ref Range Status   MRSA by PCR NEGATIVE NEGATIVE Final    Comment:        The GeneXpert MRSA Assay (FDA approved for NASAL specimens only), is one component of a comprehensive MRSA colonization surveillance program. It is not intended to diagnose MRSA infection nor to guide or monitor treatment for MRSA infections. Performed at [facility], [address] [place], [place] [address]          Radiology Studies: CT Angio Abd/Pel w/ and/or w/o  Result Date: 02/26/2020 CLINICAL DATA:  Melena, hypotension EXAM: CTA ABDOMEN AND PELVIS WITHOUT AND WITH CONTRAST TECHNIQUE: Multidetector CT imaging of the abdomen and pelvis was performed using the standard protocol during bolus administration of intravenous contrast. Multiplanar reconstructed images and  MIPs were obtained and reviewed to evaluate the vascular anatomy. CONTRAST:  100mL OMNIPAQUE IOHEXOL 350 MG/ML SOLN COMPARISON:  the previous day's study FINDINGS: VASCULAR Aorta: Mild calcified atheromatous plaque in the infrarenal segment. No aneurysm, dissection, or stenosis. Celiac: Patent without evidence of aneurysm, dissection, vasculitis or  significant stenosis. SMA: Patent without evidence of aneurysm, dissection, vasculitis or significant stenosis. Renals: Both renal arteries are patent without evidence of aneurysm, dissection, vasculitis, fibromuscular dysplasia or significant stenosis. IMA: Patent without evidence of aneurysm, dissection, vasculitis or significant stenosis. Inflow: Scattered calcified plaque in common and internal iliac arteries bilaterally. No aneurysm, dissection, or stenosis. Proximal Outflow: Bilateral common femoral and visualized portions of the superficial and profunda femoral arteries are patent without evidence of aneurysm, dissection, vasculitis or significant stenosis. Veins: Patent hepatic veins, portal vein, SMV, splenic vein, bilateral renal veins. IVC and iliac venous system unremarkable. No venous pathology identified. Review of the MIP images confirms the above findings. NON-VASCULAR Lower chest: Stable small pericardial effusion. Visualized lung bases clear. Blood pool is hypodense compared to the interventricular septum suggesting anemia. Hepatobiliary: No focal liver abnormality is seen. No gallstones, gallbladder wall thickening, or biliary dilatation. Pancreas: Unremarkable. No pancreatic ductal dilatation or surrounding inflammatory changes. Spleen: Normal in size without focal abnormality. Adrenals/Urinary Tract: Adrenal glands unremarkable. Bilateral nephrolithiasis without hydronephrosis. Symmetric renal parenchymal enhancement. 2 cm septated upper pole right renal cyst. Residual contrast material in the renal collecting systems and urinary bladder presumably  from previous day's administration. Stomach/Bowel: Stomach is decompressed. The small bowel is nondilated. Normal appendix. The colon is nondilated. Multiple diverticula throughout the colon. No evidence of active extravasation. Lymphatic: No abdominal or pelvic adenopathy. Reproductive: Status post hysterectomy. No adnexal masses. Other: Bilateral pelvic phleboliths.  No ascites.  No free air. Musculoskeletal: No acute or significant osseous findings. IMPRESSION: 1. No active extravasation or acute vascular findings. 2. Colonic diverticulosis. 3. Nephrolithiasis without hydronephrosis. Aortic Atherosclerosis (ICD10-170.0). Electronically Signed   By: Corlis Leak M.D.   On: 02/26/2020 13:40   CT Angio Abd/Pel w/ and/or w/o  Result Date: 02/25/2020 CLINICAL DATA:  Lower GI bleed. EXAM: CTA ABDOMEN AND PELVIS WITHOUT AND WITH CONTRAST TECHNIQUE: Multidetector CT imaging of the abdomen and pelvis was performed using the standard protocol during bolus administration of intravenous contrast. Multiplanar reconstructed images and MIPs were obtained and reviewed to evaluate the vascular anatomy. CONTRAST:  OMNIPAQUE IOHEXOL 350 MG/ML SOLN COMPARISON:  07/31/2019 FINDINGS: VASCULAR Aorta: Unenhanced images demonstrate calcification of the aorta and iliac arteries. Bilateral intrarenal stones. Arterial phase contrast-enhanced images demonstrate patent abdominal aorta without aneurysm or dissection. No focal stenosis. Celiac: Patent without evidence of aneurysm, dissection, vasculitis or significant stenosis. SMA: Patent without evidence of aneurysm, dissection, vasculitis or significant stenosis. Renals: Single renal arteries bilaterally. No evidence of aneurysm, dissection, or stenosis. Renal nephrograms are symmetrical. IMA: Patent without evidence of aneurysm, dissection, vasculitis or significant stenosis. Inflow: Patent without evidence of aneurysm, dissection, vasculitis or significant stenosis. Proximal  Outflow: Bilateral common femoral and visualized portions of the superficial and profunda femoral arteries are patent without evidence of aneurysm, dissection, vasculitis or significant stenosis. Veins: No obvious venous abnormality within the limitations of this arterial phase study. Review of the MIP images confirms the above findings. NON-VASCULAR Lower chest: Cardiac enlargement. Small pericardial effusion. Lung bases are clear. Hepatobiliary: No focal liver abnormality is seen. No gallstones, gallbladder wall thickening, or biliary dilatation. Pancreas: Unremarkable. No pancreatic ductal dilatation or surrounding inflammatory changes. Spleen: Normal in size without focal abnormality. Adrenals/Urinary Tract: No adrenal gland nodules. Cyst in the upper pole right kidney. Bilateral renal stones. Largest is in the lower pole right kidney and measures 9 mm in diameter. No hydronephrosis or hydroureter. Bladder is mostly decompressed with a Foley catheter in place. The catheter sits low  in the bladder. Stomach/Bowel: Stomach, small bowel, and colon are not abnormally distended. No wall thickening or inflammatory changes are appreciated. Scattered stool throughout the colon. Diverticula scattered throughout the colon. No evidence of diverticulitis. No contrast extravasation is demonstrated within the bowel, providing no indication of the site of hemorrhage. There is a rectal catheter which appears to been expelled. Lymphatic: No significant lymphadenopathy. Reproductive: Status post hysterectomy. No adnexal masses. Other: No free air or free fluid in the abdomen. Abdominal wall musculature appears intact. Musculoskeletal: Degenerative changes in the spine and hips. No destructive bone lesions. IMPRESSION: 1. No evidence of abdominal aortic aneurysm or dissection. No significant arterial stenosis. 2. No contrast extravasation is demonstrated within the bowel, providing no indication of the site of hemorrhage. 3.  Cardiac enlargement with small pericardial effusion. 4. Bilateral nonobstructing intrarenal stones. 5. Foley catheter in place. The catheter sits low in the bladder. A rectal catheter appears to have been expelled. 6. Colonic diverticulosis without evidence of diverticulitis. 7. Degenerative changes in the spine and hips. 8. Aortic atherosclerosis. Aortic Atherosclerosis (ICD10-I70.0). Electronically Signed   By: Burman Nieves M.D.   On: 02/25/2020 23:31        Scheduled Meds: . sodium chloride   Intravenous Once  . Chlorhexidine Gluconate Cloth  6 each Topical Daily  . insulin aspart  0-9 Units Subcutaneous Q4H  . mouth rinse  15 mL Mouth Rinse BID  . polyethylene glycol-electrolytes  4,000 mL Oral Once  . sodium chloride flush  3 mL Intravenous Q12H   Continuous Infusions: . sodium chloride 75 mL/hr at 02/27/20 1000     LOS: 2 days     Alwyn Ren, MD 02/27/2020, 11:43 AM

## 2020-02-27 NOTE — TOC Initial Note (Signed)
Transition of Care [facility]) - Initial/Assessment Note    Patient Details  Name: Haley Hernandez MRN: [id number] Date of Birth: 1961-01-08  Transition of [facility] CM/SW Contact:    Golda Acre, RN Phone Number: 02/27/2020, 9:23 AM  Clinical Narrative:                 Rectal bleeding, hgb 7.5 -1 unit of prbc started 102521, From home is single has daughter in the area, has a pcp. Following for progression and toc needs.  Plan is to return to home.  Expected Discharge Plan: Home/Self Care Barriers to Discharge: Barriers Unresolved (comment) (rectal bleeding)   Patient Goals and CMS Choice Patient states their goals for this hospitalization and ongoing recovery are:: to go home CMS Medicare.gov Compare Post Acute Care list provided to:: Patient    Expected Discharge Plan and Services Expected Discharge Plan: Home/Self Care   Discharge Planning Services: CM Consult   Living arrangements for the past 2 months: Single Family Home                                      Prior Living Arrangements/Services Living arrangements for the past 2 months: Single Family Home Lives with:: Self Patient language and need for interpreter reviewed:: Yes Do you feel safe going back to the place where you live?: Yes      Need for Family Participation in Patient Care: Yes (Comment) Care giver support system in place?: Yes (comment)   Criminal Activity/Legal Involvement Pertinent to Current Situation/Hospitalization: No - Comment as needed  Activities of Daily Living Home Assistive Devices/Equipment: Environmental consultant (specify type), Wheelchair ADL Screening (condition at time of admission) Is the patient deaf or have difficulty hearing?: No Does the patient have difficulty seeing, even when wearing glasses/contacts?: No Does the patient have difficulty concentrating, remembering, or making decisions?: Yes Does the patient have difficulty dressing or bathing?: Yes Does the patient have  difficulty walking or climbing stairs?: Yes  Permission Sought/Granted                  Emotional Assessment Appearance:: Appears stated age Attitude/Demeanor/Rapport: Engaged Affect (typically observed): Calm Orientation: : Oriented to Self, Oriented to Place, Oriented to  Time, Oriented to Situation Alcohol / Substance Use: Not Applicable Psych Involvement: No (comment)  Admission diagnosis:  Rectal bleeding [K62.5] Patient Active Problem List   Diagnosis Date Noted  . Rectal bleeding 02/25/2020  . History of CVA (cerebrovascular accident) 02/25/2020  . Oral thrush 10/10/2019  . Hypercalcemia 09/27/2019  . Normochromic normocytic anemia 09/27/2019  . Diarrhea 09/27/2019  . Diplopia 09/27/2019  . Dysphagia due to recent cerebrovascular accident (CVA) 09/22/2019  . GERD without esophagitis 09/22/2019  . 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 Hypertension associated with type 2 diabetes mellitus (HCC) 09/22/2019  . Chronic constipation 09/22/2019  . Major depression, recurrent, chronic (HCC) 09/22/2019  . 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 Status post tracheostomy (HCC)   . Tongue swelling   . Palliative care by specialist   . Goals of care, counseling/discussion   . Full code status   . Acute respiratory failure with hypoxemia (HCC) 07/23/2019  . Hypokalemia 07/23/2019  . Hypophosphatemia 07/23/2019  . IVH (intraventricular hemorrhage) (HCC) 07/18/2019  . OBSTRUCTIVE SLEEP APNEA 06/27/2008   PCP:  Benita Stabile, MD Pharmacy:  [facility] [address] [place] - [address] [place] [address] Phone: [phone] Fax: [phone]  [facility] [place], [place] - [address] [place] [address] Phone: [phone] Fax: [phone]  [facility] [place], [place] -  [address] [place] [address] Phone: [phone] Fax: [phone]     Social Determinants of Health (SDOH) Interventions    Readmission Risk Interventions No flowsheet data found.

## 2020-02-27 NOTE — Progress Notes (Addendum)
[facility] Gastroenterology Progress Note  Haley Hernandez 59 y.o. 18-Jul-1960  CC:  Rectal bleeding  Subjective: Patient denies any nausea, vomiting, or abdominal pain.  Continues to have rectal bleeding.  Per RN, patient drank approximately 2/3 of prep (drank 2000 cc last night, and drank 600 cc this morning).  Patient has been passing mostly frank red blood with clots per RN.  ROS : Review of Systems  Cardiovascular: Negative for chest pain and palpitations.  Gastrointestinal: Positive for blood in stool. Negative for abdominal pain, constipation, diarrhea, heartburn, melena, nausea and vomiting.    Objective: Vital signs in last 24 hours: Vitals:   02/27/20 1030 02/27/20 1045  BP: (!) 142/86 136/83  Pulse: 90 94  Resp: 12 (!) 9  Temp: 98.5 F (36.9 C)   SpO2: 93% 96%    Physical Exam:  General:  Lethargic, cooperative, no distress  Head:  Normocephalic, without obvious abnormality, atraumatic  Eyes:  Conjunctival pallor, EOMs intact  Lungs:   Clear to auscultation bilaterally, respirations unlabored  Heart:  Mildly tachycardic with regular rhythm, S1, S2 normal  Abdomen:   Soft, non-tender, non-distended, bowel sounds active all four quadrants  Extremities: Extremities normal, atraumatic, no  edema  Pulses: 2+ and symmetric    Lab Results: Recent Labs    02/26/20 0705 02/27/20 0331  NA 139 145  K 4.4 4.1  CL 108 112*  CO2 24 26  GLUCOSE 127* 103*  BUN 12 10  CREATININE 0.75 0.72  CALCIUM 9.7 9.7  MG 2.0 1.8   Recent Labs    02/25/20 1519  AST 15  ALT 14  ALKPHOS 48  BILITOT 0.3  PROT 6.6  ALBUMIN 3.7   Recent Labs    02/26/20 0705 02/26/20 1425 02/26/20 2349 02/27/20 0331  WBC 14.0*  --   --  8.0  NEUTROABS 10.8*  --   --  4.8  HGB 8.3*   < > 7.6* 7.5*  HCT 26.9*   < > 21.8* 23.9*  MCV 91.2  --   --  90.9  PLT 225  --   --  161   < > = values in this interval not displayed.   Recent Labs    02/25/20 1519  LABPROT 12.9  INR 1.0     Assessment/Plan: Painless hematochezia, most consistent with diverticular bleeding, but patient has not had a prior colonoscopy -Hgb decreased from 9.1 to 8.3 yesterday over the course of 6-7 hours.  She was given 1u pRBCs with post-transfusion rise to 9.6; however, Hgb decreased to 7.6 after 9-10 hours. Hgb remained at 7.5 this morning, currently receiving 1u pRBCs. -CT-A negative for active extravasation x 2 (10/23, 10/24)  Patient did not complete bowel prep and is still passing blood and clots, which will impede colonoscopic visualization.    Finish remaining prep, and we will start additional prep after completion if patient's stools are not clear.  NPO after midnight.  Plan for colonoscopy tomorrow.  Continue to monitor H&H with transfusion as needed to maintain Hgb >7.  [facility] will follow.   Edrick Kins PA-C 02/27/2020, 10:47 AM  Contact #  [phone]

## 2020-02-27 NOTE — H&P (View-Only) (Signed)
[facility] Progress Note  Haley Hernandez 59 y.o. 04/25/1961  CC:  Rectal bleeding  Subjective: Patient denies any nausea, vomiting, or abdominal pain.  Continues to have rectal bleeding.  Per RN, patient drank approximately 2/3 of prep (drank 2000 cc last night, and drank 600 cc this morning).  Patient has been passing mostly frank red blood with clots per RN.  ROS : Review of Systems  Cardiovascular: Negative for chest pain and palpitations.  Gastrointestinal: Positive for blood in stool. Negative for abdominal pain, constipation, diarrhea, heartburn, melena, nausea and vomiting.    Objective: Vital signs in last 24 hours: Vitals:   02/27/20 1030 02/27/20 1045  BP: (!) 142/86 136/83  Pulse: 90 94  Resp: 12 (!) 9  Temp: 98.5 F (36.9 C)   SpO2: 93% 96%    Physical Exam:  General:  Lethargic, cooperative, no distress  Head:  Normocephalic, without obvious abnormality, atraumatic  Eyes:  Conjunctival pallor, EOMs intact  Lungs:   Clear to auscultation bilaterally, respirations unlabored  Heart:  Mildly tachycardic with regular rhythm, S1, S2 normal  Abdomen:   Soft, non-tender, non-distended, bowel sounds active all four quadrants  Extremities: Extremities normal, atraumatic, no  edema  Pulses: 2+ and symmetric    Lab Results: Recent Labs    02/26/20 0705 02/27/20 0331  NA 139 145  K 4.4 4.1  CL 108 112*  CO2 24 26  GLUCOSE 127* 103*  BUN 12 10  CREATININE 0.75 0.72  CALCIUM 9.7 9.7  MG 2.0 1.8   Recent Labs    02/25/20 1519  AST 15  ALT 14  ALKPHOS 48  BILITOT 0.3  PROT 6.6  ALBUMIN 3.7   Recent Labs    02/26/20 0705 02/26/20 1425 02/26/20 2349 02/27/20 0331  WBC 14.0*  --   --  8.0  NEUTROABS 10.8*  --   --  4.8  HGB 8.3*   < > 7.6* 7.5*  HCT 26.9*   < > 21.8* 23.9*  MCV 91.2  --   --  90.9  PLT 225  --   --  161   < > = values in this interval not displayed.   Recent Labs    02/25/20 1519  LABPROT 12.9  INR 1.0     Assessment/Plan: Painless hematochezia, most consistent with diverticular bleeding, but patient has not had a prior colonoscopy -Hgb decreased from 9.1 to 8.3 yesterday over the course of 6-7 hours.  She was given 1u pRBCs with post-transfusion rise to 9.6; however, Hgb decreased to 7.6 after 9-10 hours. Hgb remained at 7.5 this morning, currently receiving 1u pRBCs. -CT-A negative for active extravasation x 2 (10/23, 10/24)  Patient did not complete bowel prep and is still passing blood and clots, which will impede colonoscopic visualization.    Finish remaining prep, and we will start additional prep after completion if patient's stools are not clear.  NPO after midnight.  Plan for colonoscopy tomorrow.  Continue to monitor H&H with transfusion as needed to maintain Hgb >7.  [facility] will follow.   Tobin Cadiente Baron-Johnson PA-C 02/27/2020, 10:47 AM  Contact #  [phone] 

## 2020-02-28 ENCOUNTER — Encounter (HOSPITAL_COMMUNITY)
Admission: EM | Disposition: A | Discharge status: Home Health | Payer: Self-pay | Source: Home / Self Care | Attending: Internal Medicine

## 2020-02-28 ENCOUNTER — Inpatient Hospital Stay (HOSPITAL_COMMUNITY): Payer: 59 | Admitting: Certified Registered Nurse Anesthetist

## 2020-02-28 ENCOUNTER — Encounter (HOSPITAL_COMMUNITY): Payer: Self-pay | Admitting: Internal Medicine

## 2020-02-28 HISTORY — PX: COLONOSCOPY: SHX5424

## 2020-02-28 HISTORY — PX: BIOPSY: SHX5522

## 2020-02-28 LAB — GLUCOSE, CAPILLARY
Glucose-Capillary: 107 mg/dL — ABNORMAL HIGH (ref 70–99)
Glucose-Capillary: 90 mg/dL (ref 70–99)
Glucose-Capillary: 85 mg/dL (ref 70–99)
Glucose-Capillary: 91 mg/dL (ref 70–99)

## 2020-02-28 LAB — BPAM RBC
Blood Product Expiration Date: 202111242359
Blood Product Expiration Date: 202111242359
ISSUE DATE / TIME: 202110241025
ISSUE DATE / TIME: 202110251023
Unit Type and Rh: 5100
Unit Type and Rh: 5100

## 2020-02-28 LAB — CBC WITH DIFFERENTIAL/PLATELET
Abs Immature Granulocytes: 0.06 10*3/uL (ref 0.00–0.07)
Basophils Absolute: 0 10*3/uL (ref 0.0–0.1)
Basophils Relative: 1 %
Eosinophils Absolute: 0.2 10*3/uL (ref 0.0–0.5)
Eosinophils Relative: 2 %
HCT: 25.5 % — ABNORMAL LOW (ref 36.0–46.0)
Hemoglobin: 8.4 g/dL — ABNORMAL LOW (ref 12.0–15.0)
Immature Granulocytes: 1 %
Lymphocytes Relative: 26 %
Lymphs Abs: 1.9 10*3/uL (ref 0.7–4.0)
MCH: 29.1 pg (ref 26.0–34.0)
MCHC: 32.9 g/dL (ref 30.0–36.0)
MCV: 88.2 fL (ref 80.0–100.0)
Monocytes Absolute: 0.6 10*3/uL (ref 0.1–1.0)
Monocytes Relative: 8 %
Neutro Abs: 4.5 10*3/uL (ref 1.7–7.7)
Neutrophils Relative %: 62 %
Platelets: 153 10*3/uL (ref 150–400)
RBC: 2.89 MIL/uL — ABNORMAL LOW (ref 3.87–5.11)
RDW: 14.2 % (ref 11.5–15.5)
WBC: 7.3 10*3/uL (ref 4.0–10.5)
nRBC: 0 % (ref 0.0–0.2)

## 2020-02-28 LAB — BASIC METABOLIC PANEL
Anion gap: 7 (ref 5–15)
BUN: 6 mg/dL (ref 6–20)
CO2: 27 mmol/L (ref 22–32)
Calcium: 9.6 mg/dL (ref 8.9–10.3)
Chloride: 108 mmol/L (ref 98–111)
Creatinine, Ser: 0.59 mg/dL (ref 0.44–1.00)
GFR, Estimated: >60 mL/min (ref >60)
Glucose, Bld: 97 mg/dL (ref 70–99)
Potassium: 3.2 mmol/L — ABNORMAL LOW (ref 3.5–5.1)
Sodium: 142 mmol/L (ref 135–145)

## 2020-02-28 LAB — TYPE AND SCREEN
ABO/RH(D): O POS
Antibody Screen: NEGATIVE
Unit division: 0
Unit division: 0

## 2020-02-28 LAB — HEMOGLOBIN AND HEMATOCRIT, BLOOD
HCT: 25.3 % — ABNORMAL LOW (ref 36.0–46.0)
HCT: 26.3 % — ABNORMAL LOW (ref 36.0–46.0)
HCT: 25 % — ABNORMAL LOW (ref 36.0–46.0)
HCT: 25 % — ABNORMAL LOW (ref 36.0–46.0)
Hemoglobin: 8.3 g/dL — ABNORMAL LOW (ref 12.0–15.0)
Hemoglobin: 9.1 g/dL — ABNORMAL LOW (ref 12.0–15.0)
Hemoglobin: 8.2 g/dL — ABNORMAL LOW (ref 12.0–15.0)
Hemoglobin: 8.5 g/dL — ABNORMAL LOW (ref 12.0–15.0)

## 2020-02-28 LAB — MAGNESIUM: Magnesium: 1.8 mg/dL (ref 1.7–2.4)

## 2020-02-28 SURGERY — COLONOSCOPY
Anesthesia: Monitor Anesthesia Care

## 2020-02-28 MED ORDER — HYOSCYAMINE SULFATE 0.5 MG/ML IJ SOLN
0.5000 mg | Freq: Four times a day (QID) | INTRAMUSCULAR | Status: AC | PRN
  Administered 2020-02-28: 0.5 mg via INTRAVENOUS
  Filled 2020-02-28: qty 1

## 2020-02-28 MED ORDER — LACTATED RINGERS IV SOLN: INTRAVENOUS | Status: DC | PRN

## 2020-02-28 MED ORDER — PROPOFOL 500 MG/50ML IV EMUL
INTRAVENOUS | Status: DC | PRN
  Administered 2020-02-28: 100 ug/kg/min via INTRAVENOUS

## 2020-02-28 MED ORDER — BUSPIRONE HCL 5 MG PO TABS
5.0000 mg | ORAL_TABLET | Freq: Two times a day (BID) | ORAL | Status: DC
  Administered 2020-02-28 – 2020-03-05 (×12): 5 mg via ORAL
  Filled 2020-02-28 (×11): qty 1

## 2020-02-28 MED ORDER — LORAZEPAM 2 MG/ML IJ SOLN
0.5000 mg | Freq: Once | INTRAMUSCULAR | Status: AC
  Administered 2020-02-28: 0.5 mg via INTRAVENOUS
  Filled 2020-02-28: qty 1

## 2020-02-28 MED ORDER — FENTANYL CITRATE (PF) 100 MCG/2ML IJ SOLN
INTRAMUSCULAR | Status: AC
  Filled 2020-02-28: qty 2

## 2020-02-28 MED ORDER — CLONIDINE HCL 0.3 MG PO TABS
0.3000 mg | ORAL_TABLET | Freq: Two times a day (BID) | ORAL | Status: DC
  Administered 2020-02-28: 0.3 mg via ORAL
  Filled 2020-02-28: qty 1

## 2020-02-28 MED ORDER — PROPOFOL 10 MG/ML IV BOLUS
INTRAVENOUS | Status: DC | PRN
  Administered 2020-02-28: 30 mg via INTRAVENOUS
  Administered 2020-02-28: 20 mg via INTRAVENOUS

## 2020-02-28 NOTE — Op Note (Signed)
[facility] Patient Name: Haley Hernandez Procedure Date: 02/28/2020 MRN: [id number] Attending MD: [name] , MD Date of Birth: 1960-08-05 CSN: [id number] Age: 59 Admit Type: Outpatient Procedure:                Colonoscopy Indications:              This is the patient's first colonoscopy,                            Hematochezia Providers:                [name], MD, Dwain Sarna, RN, Arlee Muslim Tech., Technician, Rosilyn Mings,                            Technician, Nadene Rubins Referring MD:             hospital team Medicines:                Propofol per Anesthesia, Monitored Anesthesia Care Complications:            No immediate complications. Estimated Blood Loss:     Estimated blood loss was minimal. Procedure:                Pre-Anesthesia Assessment:                           - Prior to the procedure, a History and Physical                            was performed, and patient medications and                            allergies were reviewed. The patient's tolerance of                            previous anesthesia was also reviewed. The risks                            and benefits of the procedure and the sedation                            options and risks were discussed with the patient.                            All questions were answered, and informed consent                            was obtained. Prior Anticoagulants: The patient has                            taken no previous anticoagulant or antiplatelet                            agents.  ASA Grade Assessment: III - A patient with                            severe systemic disease. After reviewing the risks                            and benefits, the patient was deemed in                            satisfactory condition to undergo the procedure.                           After obtaining informed consent, the colonoscope                             was passed under direct vision. Throughout the                            procedure, the patient's blood pressure, pulse, and                            oxygen saturations were monitored continuously. The                            PCF-[id number] ([id number] Olympus pediatric colonscope                            was introduced through the anus and advanced to the                            the cecum, identified by appendiceal orifice and                            ileocecal valve. The colonoscopy was somewhat                            difficult due to inadequate bowel prep, significant                            looping and a tortuous colon. Successful completion                            of the procedure was aided by straightening and                            shortening the scope to obtain bowel loop reduction                            and lavage. The patient tolerated the procedure                            well. The quality of the bowel preparation was  inadequate. The ileocecal valve, appendiceal                            orifice, and rectum were photographed. Scope In: 12:40:59 PM Scope Out: 1:03:28 PM Scope Withdrawal Time: 0 hours 15 minutes 35 seconds  Total Procedure Duration: 0 hours 22 minutes 29 seconds  Findings:      The perianal and digital rectal examinations were normal.      A patchy area of mildly congested, erythematous and ulcerated mucosa was       found in the proximal ascending colon and in the cecum. Biopsies were       taken with a cold forceps for histology. Estimated blood loss was       minimal.      Multiple small and large-mouthed diverticula were found in the sigmoid       colon, descending colon and ascending colon.      Internal hemorrhoids were found during retroflexion. The hemorrhoids       were small and Grade I (internal hemorrhoids that do not prolapse). Impression:               - Preparation of the colon was  inadequate.                           - Congested, erythematous and ulcerated mucosa in                            the proximal ascending colon and in the cecum.                            Biopsied.                           - Diverticulosis in the sigmoid colon, in the                            descending colon and in the ascending colon.                           - Internal hemorrhoids. Moderate Sedation:      Not Applicable - Patient had care per Anesthesia. Recommendation:           - Clear liquid diet.                           - Await pathology results.                           - Repeat colonoscopy for surveillance based on                            pathology results. Procedure Code(s):        --- Professional ---                           [phone], Colonoscopy, flexible; with biopsy, single  or multiple Diagnosis Code(s):        --- Professional ---                           K92.1, Melena (includes Hematochezia)                           K63.3, Ulcer of intestine                           K63.89, Other specified diseases of intestine                           K64.0, First degree hemorrhoids                           K57.30, Diverticulosis of large intestine without                            perforation or abscess without bleeding CPT copyright 2019 American Medical Association. All rights reserved. The codes documented in this report are preliminary and upon coder review may  be revised to meet current compliance requirements. [name], MD 02/28/2020 1:12:45 PM This report has been signed electronically. Number of Addenda: 0

## 2020-02-28 NOTE — Anesthesia Preprocedure Evaluation (Addendum)
Anesthesia Evaluation  Patient identified by MRN, date of birth, ID band Patient awake and Patient confused    Reviewed: Allergy & Precautions, NPO status , Patient's Chart, lab work & pertinent test results  History of Anesthesia Complications Negative for: history of anesthetic complications  Airway Mallampati: II  TM Distance: >3 FB Neck ROM: Full    Dental  (+) Dental Advisory Given   Pulmonary sleep apnea , COPD,  COPD inhaler,  02/25/2020 SARS coronavirus NEG H/o trach with CVA   breath sounds clear to auscultation       Cardiovascular hypertension, Pt. on medications  Rhythm:Regular Rate:Normal  07/2019 ECHO: EF 60-65%. The LV has hyperdynamic function with no regional wall motion abnormalities,  severe concentric LVH, no significant valvular abnormalities   Neuro/Psych Depression CVA (s/p IVH)    GI/Hepatic Neg liver ROS, GERD  ,  Endo/Other  diabetes (glu 107)  Renal/GU negative Renal ROS     Musculoskeletal   Abdominal (+) - obese,   Peds  Hematology  (+) Blood dyscrasia (Hb 9.1), anemia ,   Anesthesia Other Findings   Reproductive/Obstetrics                           Anesthesia Physical Anesthesia Plan  ASA: III  Anesthesia Plan: MAC   Post-op Pain Management:    Induction:   PONV Risk Score and Plan: 2 and Treatment may vary due to age or medical condition  Airway Management Planned: Natural Airway and Simple Face Mask  Additional Equipment: None  Intra-op Plan:   Post-operative Plan:   Informed Consent: I have reviewed the patients History and Physical, chart, labs and discussed the procedure including the risks, benefits and alternatives for the proposed anesthesia with the patient or authorized representative who has indicated his/her understanding and acceptance.     Dental advisory given and Consent reviewed with POA  Plan Discussed with: CRNA and  Surgeon  Anesthesia Plan Comments:        Anesthesia Quick Evaluation

## 2020-02-28 NOTE — Progress Notes (Signed)
Per family they do not want pt to have any narcotics.

## 2020-02-28 NOTE — Progress Notes (Signed)
PROGRESS NOTE    Haley Hernandez  QJJ:[id number] DOB: [birth year] DOA: 02/25/2020 PCP: Benita Stabile, MD    Brief Narrative:-year-old female lives at home with her daughter had intraventricular hemorrhage in March 2021 with left-sided hemiplegia with history of COPD obstructive sleep apnea has had a PEG and a trach, she was brought in by the daughter to [facility] with 3 episodes of bright red bleeding per rectum.  Since coming to [facility] patient has had 5-6 large bloody bowel movements.  She is not on any anticoagulation at home.  Assessment & Plan:   Principal Problem:   Rectal bleeding Active Problems:   Type 2 diabetes mellitus with neurological complications (HCC)   Hypertension associated with type 2 diabetes mellitus (HCC)   Major depression, recurrent, chronic (HCC)   History of CVA (cerebrovascular accident)   #1 Lower GI bleed likely secondary to diverticular bleed.  Patient admitted with multiple episodes of frank bright red bleeding per rectum.  She has not had a colonoscopy in the past.  She is not on blood thinners at home.  [facility] did colonoscopy 02/28/2020-inadequate preparation of the colon, congested erythematous and ulcerated mucosa in the proximal ascending colon and in the cecum which were biopsied.  Diverticulosis in the sigmoid colon in the descending colon and in the ascending colon.  Internal hemorrhoids.  She will need follow-up with GI on discharge.  She received a total of 3 units of blood transfusion during this hospital stay.  Her hemoglobin is finally stabilizing.  Hemoglobin is 9.1.  If she remains stable overnight will consider discharge in a.m. CT angiogram of the abdomen x2 with no evidence of active bleeding.   #2 history of hypertension-her blood pressure was soft on admission.  Blood pressure is starting to improve 155/88.  She is on multiple antihypertensives at home.  She is on Catapres hydralazine and labetalol at home.  Restart Catapres.   She is allergic to Norvasc and losartan which is caused angioedema.    Estimated body mass index is 27.08 kg/m as calculated from the following:   Height as of this encounter: 5\' 1"  (1.549 m).   Weight as of this encounter: 65 kg.  DVT prophylaxis: SCD  code Status full code Family Communication: Will discuss with her daughter Disposition Plan:  Status is: Inpatient  Dispo: The patient is from: Home              Anticipated d/c is to: Home              Anticipated d/c date is: 1 DAY              Patient currently is not medically stable to d/c.   Consultants: GI  Procedures: None Antimicrobials: None  Subjective:  Patient resting in bed saw her earlier today waiting for colonoscopy  Objective: Vitals:   02/28/20 1132 02/28/20 1141 02/28/20 1311 02/28/20 1330  BP: (!) 183/112 (!) 156/96 (!) 162/85 (!) 155/88  Pulse: 82  81 94  Resp: 12  12 15   Temp: 98.2 F (36.8 C)     TempSrc: Oral     SpO2: 100%  100% 99%  Weight:      Height:        Intake/Output Summary (Last 24 hours) at 02/28/2020 1501 Last data filed at 02/28/2020 1301 Gross per 24 hour  Intake 3451.67 ml  Output 2720 ml  Net 731.67 ml   Filed Weights   02/25/20 1407 02/25/20 2125  Weight: 63.5 kg 65 kg    Examination:  General exam: She is awake she does not appear to be in any distress Respiratory system: Clear to auscultation. Respiratory effort normal. Cardiovascular system: S1 & S2 heard, RRR. No JVD, murmurs, rubs, gallops or clicks. No pedal edema. Gastrointestinal system: Abdomen is nondistended, soft and nontender. No organomegaly or masses felt. Normal bowel sounds heard. Central nervous system: Left upper and lower extremity hemiplegia noted  extremities: Symmetric 5 x 5 power. Skin: No rashes, lesions or ulcers Psychiatry: Judgement and insight appear normal. Mood & affect appropriate.     Data Reviewed: I have personally reviewed following labs and imaging studies  CBC: Recent  Labs  Lab 02/26/20 0705 02/26/20 1425 02/27/20 0331 02/27/20 1649 02/27/20 2334 02/28/20 0244 02/28/20 0838  WBC 14.0*  --  8.0 6.4 7.2 7.3  --   NEUTROABS 10.8*  --  4.8 4.2 4.4 4.5  --   HGB 8.3*   < > 7.5* 8.9* 8.5* 8.4*  8.3* 9.1*  HCT 26.9*   < > 23.9* 26.7* 25.0* 25.5*  25.3* 26.3*  MCV 91.2  --  90.9 89.0 87.7 88.2  --   PLT 225  --  161 65* 129* 153  --    < > = values in this interval not displayed.   Basic Metabolic Panel: Recent Labs  Lab 02/25/20 1519 02/26/20 0705 02/27/20 0331 02/28/20 0244  NA 139 139 145 142  K 4.4 4.4 4.1 3.2*  CL 102 108 112* 108  CO2 30 24 26 27   GLUCOSE 106* 127* 103* 97  BUN 8 12 10 6   CREATININE 0.68 0.75 0.72 0.59  CALCIUM 10.1 9.7 9.7 9.6  MG  --  2.0 1.8 1.8   GFR: Estimated Creatinine Clearance: 66.2 mL/min (by C-G formula based on SCr of 0.59 mg/dL). Liver Function Tests: Recent Labs  Lab 02/25/20 1519  AST 15  ALT 14  ALKPHOS 48  BILITOT 0.3  PROT 6.6  ALBUMIN 3.7   Recent Labs  Lab 02/26/20 0705  LIPASE 31   No results for input(s): AMMONIA in the last 168 hours. Coagulation Profile: Recent Labs  Lab 02/25/20 1519  INR 1.0   Cardiac Enzymes: No results for input(s): CKTOTAL, CKMB, CKMBINDEX, TROPONINI in the last 168 hours. BNP (last 3 results) No results for input(s): PROBNP in the last 8760 hours. HbA1C: No results for input(s): HGBA1C in the last 72 hours. CBG: Recent Labs  Lab 02/27/20 1211 02/27/20 1723 02/27/20 1931 02/27/20 2320 02/28/20 0745  GLUCAP 84 92 101* 101* 107*   Lipid Profile: No results for input(s): CHOL, HDL, LDLCALC, TRIG, CHOLHDL, LDLDIRECT in the last 72 hours. Thyroid Function Tests: No results for input(s): TSH, T4TOTAL, FREET4, T3FREE, THYROIDAB in the last 72 hours. Anemia Panel: No results for input(s): VITAMINB12, FOLATE, FERRITIN, TIBC, IRON, RETICCTPCT in the last 72 hours. Sepsis Labs: No results for input(s): PROCALCITON, LATICACIDVEN in the last 168  hours.  Recent Results (from the past 240 hour(s))  Respiratory Panel by RT PCR (Flu A&B, Covid) - Nasopharyngeal Swab     Status: None   Collection Time: 02/25/20  3:19 PM   Specimen: Nasopharyngeal Swab  Result Value Ref Range Status   SARS Coronavirus 2 by RT PCR NEGATIVE NEGATIVE Final    Comment: (NOTE) SARS-CoV-2 target nucleic acids are NOT DETECTED.  The SARS-CoV-2 RNA is generally detectable in upper respiratoy specimens during the acute phase of infection. The lowest concentration of SARS-CoV-2 viral copies  this assay can detect is 131 copies/mL. A negative result does not preclude SARS-Cov-2 infection and should not be used as the sole basis for treatment or other patient management decisions. A negative result may occur with  improper specimen collection/handling, submission of specimen other than nasopharyngeal swab, presence of viral mutation(s) within the areas targeted by this assay, and inadequate number of viral copies (<131 copies/mL). A negative result must be combined with clinical observations, patient history, and epidemiological information. The expected result is Negative.  Fact Sheet for Patients:  https://www.moore.com/  Fact Sheet for Healthcare Providers:  https://www.young.biz/  This test is no t yet approved or cleared by the [place] FDA and  has been authorized for detection and/or diagnosis of SARS-CoV-2 by FDA under an Emergency Use Authorization (EUA). This EUA will remain  in effect (meaning this test can be used) for the duration of the COVID-19 declaration under Section 564(b)(1) of the Act, 21 U.S.C. section 360bbb-3(b)(1), unless the authorization is terminated or revoked sooner.     Influenza A by PCR NEGATIVE NEGATIVE Final   Influenza B by PCR NEGATIVE NEGATIVE Final    Comment: (NOTE) The Xpert Xpress SARS-CoV-2/FLU/RSV assay is intended as an aid in  the diagnosis of influenza from  Nasopharyngeal swab specimens and  should not be used as a sole basis for treatment. Nasal washings and  aspirates are unacceptable for Xpert Xpress SARS-CoV-2/FLU/RSV  testing.  Fact Sheet for Patients: https://www.moore.com/  Fact Sheet for Healthcare Providers: https://www.young.biz/  This test is not yet approved or cleared by the [place] FDA and  has been authorized for detection and/or diagnosis of SARS-CoV-2 by  FDA under an Emergency Use Authorization (EUA). This EUA will remain  in effect (meaning this test can be used) for the duration of the  Covid-19 declaration under Section 564(b)(1) of the Act, 21  U.S.C. section 360bbb-3(b)(1), unless the authorization is  terminated or revoked. Performed at [facility], [address] [place], [place] [address]   MRSA PCR Screening     Status: None   Collection Time: 02/25/20  9:25 PM   Specimen: Nasal Mucosa; Nasopharyngeal  Result Value Ref Range Status   MRSA by PCR NEGATIVE NEGATIVE Final    Comment:        The GeneXpert MRSA Assay (FDA approved for NASAL specimens only), is one component of a comprehensive MRSA colonization surveillance program. It is not intended to diagnose MRSA infection nor to guide or monitor treatment for MRSA infections. Performed at [facility], [address] [place], [place] [address]          Radiology Studies: No results found.      Scheduled Meds: . sodium chloride   Intravenous Once  . Chlorhexidine Gluconate Cloth  6 each Topical Daily  . insulin aspart  0-9 Units Subcutaneous Q4H  . mouth rinse  15 mL Mouth Rinse BID  . sodium chloride flush  3 mL Intravenous Q12H   Continuous Infusions: . sodium chloride 75 mL/hr at 02/28/20 1100     LOS: 3 days     Alwyn Ren, MD 02/28/2020, 3:01 PM

## 2020-02-28 NOTE — Brief Op Note (Signed)
Few small dark red to black blood clots in rectum otherwise no blood products seen. Suspect diverticular bleed source that has resolved. See endopro note for details. No further GI recs planned. Advance diet. F/U on biopsies as outpt. Unable to reach daughter, Jon Gills by phone. Will sign off. Call if questions.

## 2020-02-28 NOTE — Transfer of Care (Signed)
Immediate Anesthesia Transfer of Care Note  Patient: Joua Bake  Procedure(s) Performed: COLONOSCOPY (N/A ) BIOPSY  Patient Location: Endoscopy Unit  Anesthesia Type:MAC  Level of Consciousness: drowsy, patient cooperative and responds to stimulation  Airway & Oxygen Therapy: Patient Spontanous Breathing and Patient connected to face mask oxygen  Post-op Assessment: Report given to RN and Post -op Vital signs reviewed and stable  Post vital signs: Reviewed and stable  Last Vitals:  Vitals Value Taken Time  BP 162/85 02/28/20 1311  Temp    Pulse 87 02/28/20 1316  Resp 18 02/28/20 1316  SpO2 100 % 02/28/20 1316  Vitals shown include unvalidated device data.  Last Pain:  Vitals:   02/28/20 1132  TempSrc: Oral  PainSc: 0-No pain         Complications: No complications documented.

## 2020-02-28 NOTE — Interval H&P Note (Signed)
History and Physical Interval Note:  02/28/2020 12:29 PM  Haley Hernandez  has presented today for surgery, with the diagnosis of hematochezia.  The various methods of treatment have been discussed with the patient and family. After consideration of risks, benefits and other options for treatment, the patient has consented to  Procedure(s): COLONOSCOPY (N/A) as a surgical intervention.  The patient's history has been reviewed, patient examined, no change in status, stable for surgery.  I have reviewed the patient's chart and labs.  Questions were answered to the patient's satisfaction.     [name]

## 2020-02-28 NOTE — Anesthesia Postprocedure Evaluation (Signed)
Anesthesia Post Note  Patient: Haley Hernandez  Procedure(s) Performed: COLONOSCOPY (N/A ) BIOPSY     Patient location during evaluation: Endoscopy Anesthesia Type: MAC Level of consciousness: awake and alert, patient cooperative and oriented Pain management: pain level controlled Vital Signs Assessment: post-procedure vital signs reviewed and stable Respiratory status: spontaneous breathing, nonlabored ventilation and respiratory function stable Cardiovascular status: blood pressure returned to baseline and stable Postop Assessment: no apparent nausea or vomiting Anesthetic complications: no   No complications documented.  Last Vitals:  Vitals:   02/28/20 1141 02/28/20 1311  BP: (!) 156/96 (!) 162/85  Pulse:  81  Resp:  12  Temp:    SpO2:  100%    Last Pain:  Vitals:   02/28/20 1132  TempSrc: Oral  PainSc: 0-No pain                 Lanitra Battaglini,E. Gracieann Stannard

## 2020-02-29 ENCOUNTER — Encounter (HOSPITAL_COMMUNITY): Payer: Self-pay | Admitting: Gastroenterology

## 2020-02-29 ENCOUNTER — Ambulatory Visit: Payer: 59 | Admitting: Cardiovascular Disease

## 2020-02-29 LAB — CBC WITH DIFFERENTIAL/PLATELET
Abs Immature Granulocytes: 0.02 10*3/uL (ref 0.00–0.07)
Basophils Absolute: 0 10*3/uL (ref 0.0–0.1)
Basophils Relative: 1 %
Eosinophils Absolute: 0.2 10*3/uL (ref 0.0–0.5)
Eosinophils Relative: 2 %
HCT: 22.8 % — ABNORMAL LOW (ref 36.0–46.0)
Hemoglobin: 7.6 g/dL — ABNORMAL LOW (ref 12.0–15.0)
Immature Granulocytes: 0 %
Lymphocytes Relative: 22 %
Lymphs Abs: 1.6 10*3/uL (ref 0.7–4.0)
MCH: 29.7 pg (ref 26.0–34.0)
MCHC: 33.3 g/dL (ref 30.0–36.0)
MCV: 89.1 fL (ref 80.0–100.0)
Monocytes Absolute: 0.6 10*3/uL (ref 0.1–1.0)
Monocytes Relative: 8 %
Neutro Abs: 4.7 10*3/uL (ref 1.7–7.7)
Neutrophils Relative %: 67 %
Platelets: 162 10*3/uL (ref 150–400)
RBC: 2.56 MIL/uL — ABNORMAL LOW (ref 3.87–5.11)
RDW: 14.2 % (ref 11.5–15.5)
WBC: 7.1 10*3/uL (ref 4.0–10.5)
nRBC: 0 % (ref 0.0–0.2)

## 2020-02-29 LAB — HEMOGLOBIN AND HEMATOCRIT, BLOOD
HCT: 23.1 % — ABNORMAL LOW (ref 36.0–46.0)
HCT: 22.3 % — ABNORMAL LOW (ref 36.0–46.0)
HCT: 23.2 % — ABNORMAL LOW (ref 36.0–46.0)
HCT: 22.3 % — ABNORMAL LOW (ref 36.0–46.0)
Hemoglobin: 7.6 g/dL — ABNORMAL LOW (ref 12.0–15.0)
Hemoglobin: 7.3 g/dL — ABNORMAL LOW (ref 12.0–15.0)
Hemoglobin: 7.7 g/dL — ABNORMAL LOW (ref 12.0–15.0)
Hemoglobin: 7.3 g/dL — ABNORMAL LOW (ref 12.0–15.0)

## 2020-02-29 LAB — GLUCOSE, CAPILLARY
Glucose-Capillary: 99 mg/dL (ref 70–99)
Glucose-Capillary: 99 mg/dL (ref 70–99)
Glucose-Capillary: 94 mg/dL (ref 70–99)
Glucose-Capillary: 133 mg/dL — ABNORMAL HIGH (ref 70–99)
Glucose-Capillary: 100 mg/dL — ABNORMAL HIGH (ref 70–99)
Glucose-Capillary: 108 mg/dL — ABNORMAL HIGH (ref 70–99)

## 2020-02-29 LAB — MAGNESIUM: Magnesium: 1.6 mg/dL — ABNORMAL LOW (ref 1.7–2.4)

## 2020-02-29 LAB — BASIC METABOLIC PANEL
Anion gap: 6 (ref 5–15)
BUN: <5 mg/dL — ABNORMAL LOW (ref 6–20)
CO2: 28 mmol/L (ref 22–32)
Calcium: 9.4 mg/dL (ref 8.9–10.3)
Chloride: 106 mmol/L (ref 98–111)
Creatinine, Ser: 0.75 mg/dL (ref 0.44–1.00)
GFR, Estimated: >60 mL/min (ref >60)
Glucose, Bld: 108 mg/dL — ABNORMAL HIGH (ref 70–99)
Potassium: 3.3 mmol/L — ABNORMAL LOW (ref 3.5–5.1)
Sodium: 140 mmol/L (ref 135–145)

## 2020-02-29 LAB — SURGICAL PATHOLOGY

## 2020-02-29 MED ORDER — POTASSIUM CHLORIDE CRYS ER 20 MEQ PO TBCR
40.0000 meq | EXTENDED_RELEASE_TABLET | Freq: Once | ORAL | Status: AC
  Administered 2020-02-29: 40 meq via ORAL
  Filled 2020-02-29: qty 2

## 2020-02-29 MED ORDER — CLONIDINE HCL 0.1 MG PO TABS
0.1000 mg | ORAL_TABLET | Freq: Two times a day (BID) | ORAL | Status: DC
  Administered 2020-02-29 – 2020-03-05 (×11): 0.1 mg via ORAL
  Filled 2020-02-29 (×11): qty 1

## 2020-02-29 MED ORDER — MAGNESIUM SULFATE 2 GM/50ML IV SOLN
2.0000 g | Freq: Once | INTRAVENOUS | Status: AC
  Administered 2020-02-29: 2 g via INTRAVENOUS
  Filled 2020-02-29: qty 50

## 2020-02-29 NOTE — TOC Progression Note (Signed)
Transition of Care [facility]) - Progression Note    Patient Details  Name: Znya Albino MRN: [id number] Date of Birth: June 21, 1960  Transition of Care [facility]) CM/SW Contact  Golda Acre, RN Phone Number: 02/29/2020, 8:24 AM  Clinical Narrative:    female lives at home with her daughter had intraventricular hemorrhage in March 2021 with left-sided hemiplegia with history of COPD obstructive sleep apnea has had a PEG and a trach, she was brought in by the daughter to [facility] with 3 episodes of bright red bleeding per rectum.  Since coming to [facility] patient has had 5-6 large bloody bowel movements.  She is not on any anticoagulation at home.  Assessment & Plan:   Principal Problem:   Rectal bleeding Active Problems:   Type 2 diabetes mellitus with neurological complications (HCC)   Hypertension associated with type 2 diabetes mellitus (HCC)   Major depression, recurrent, chronic (HCC)   History of CVA (cerebrovascular accident)   #1 Lower GI bleed likely secondary to diverticular bleed.  Patient admitted with multiple episodes of frank bright red bleeding per rectum.  She has not had a colonoscopy in the past.  She is not on blood thinners at home.  [facility] did colonoscopy 02/28/2020-inadequate preparation of the colon, congested erythematous and ulcerated mucosa in the proximal ascending colon and in the cecum which were biopsied.  Diverticulosis in the sigmoid colon in the descending colon and in the ascending colon.  Internal hemorrhoids.  She will need follow-up with GI on discharge.  She received a total of 3 units of blood transfusion during this hospital stay.  Her hemoglobin is finally stabilizing.  Hemoglobin is 9.1.  If she remains stable overnight will consider discharge in a.m. CT angiogram of the abdomen x2 with no evidence of active bleeding.   #2 history of hypertension-her blood pressure was soft on admission.  Blood pressure is starting to improve  155/88.  She is on multiple antihypertensives at home.  She is on Catapres hydralazine and labetalol at home.  Restart Catapres.  She is allergic to Norvasc and losartan which is caused angioedema.   hgb on 102721=7.6  Following for toc needs,following for progression.   Expected Discharge Plan: Home/Self Care Barriers to Discharge: Barriers Unresolved (comment) (rectal bleeding)  Expected Discharge Plan and Services Expected Discharge Plan: Home/Self Care   Discharge Planning Services: CM Consult   Living arrangements for the past 2 months: Single Family Home                                       Social Determinants of Health (SDOH) Interventions    Readmission Risk Interventions No flowsheet data found.

## 2020-02-29 NOTE — Progress Notes (Signed)
PROGRESS NOTE  Haley Hernandez EXN:[id number] DOB: [birth year] DOA: 02/25/2020 PCP: Benita Stabile, MD  HPI/Recap of past 33 hours: year-old female lives at home with her daughter had intraventricular hemorrhage in March 2021 with left-sided hemiplegia with history of COPD obstructive sleep apnea has had a PEG and a trach, she was brought in by the daughter to [facility] with 3 episodes of bright red bleeding per rectum.  Since coming to [facility] patient has had 5-6 large bloody bowel movements.  She is not on any anticoagulation at home.  02/29/20: Seen and examined.  She denies any melena or hematochezia this morning, no bowel movement, no abdominal pain.  Soft BP this morning reduce dose of clonidine down to 0.1 mg twice daily from 0.3 mg twice daily.  Assessment/Plan: Principal Problem:   Rectal bleeding Active Problems:   Type 2 diabetes mellitus with neurological complications (HCC)   Hypertension associated with type 2 diabetes mellitus (HCC)   Major depression, recurrent, chronic (HCC)   History of CVA (cerebrovascular accident)  Lower GI bleed secondary to diverticular bleed Admitted with multiple episodes of hematochezia First colonoscopy done on 02/28/2020 by [facility] with findings consistent with diverticular bleed: Inadequate preparation of the colon, congested erythematous and ulcerated mucosa in the proximal ascending colon and the cecum which were biopsied.  Diverticulosis in the sigmoid colon, in the descending colon and in the ascending colon.  Internal hemorrhoids. Lower GI bleed appears to have resolved this morning She received 3 units of blood transfusion during this hospital stay. Hemoglobin stable at 7.6 from 7.6 yesterday Continue to monitor H&H Will need to follow-up with GI outpatient  Acute blood loss anemia secondary to lower GI bleed Management as per above.  Essential hypertension Soft BPs this morning We will continue to hold off home dose of  hydralazine, labetalol Decreased dose of clonidine from 0.3 mg twice daily to 0.1 mg twice daily. Continue to monitor vital signs, MAP greater than 65  Hypokalemia/hypomagnesemia Repleted  Ambulatory dysfunction Wheelchair-bound      Code Status: Full code  Family Communication: None at bedside  Disposition Plan: To home with home health services   Consultants:  GI  Procedures:  Colonoscopy  Antimicrobials:  None  DVT prophylaxis: SCDs  Status is: Inpatient   Dispo:  Patient From: Home  Planned Disposition: Home with Health Care Svc  Expected discharge date: 03/01/20  Medically stable for discharge: No, ongoing management of acute blood loss anemia in the setting of lower GI bleed, diverticular.         Objective: Vitals:   02/29/20 0400 02/29/20 0406 02/29/20 0600 02/29/20 0814  BP:  111/76 111/77   Pulse:  79 73   Resp:  15 15   Temp: 98.7 F (37.1 C)   98.1 F (36.7 C)  TempSrc: Oral   Axillary  SpO2:  100% 100%   Weight:      Height:        Intake/Output Summary (Last 24 hours) at 02/29/2020 0928 Last data filed at 02/29/2020 0600 Gross per 24 hour  Intake 1916.35 ml  Output 1500 ml  Net 416.35 ml   Filed Weights   02/25/20 1407 02/25/20 2125  Weight: 63.5 kg 65 kg    Exam:  . General: 59 y.o. year-old female well developed well nourished in no acute distress.  Alert and oriented x3. . Cardiovascular: Regular rate and rhythm with no rubs or gallops.  No thyromegaly or JVD noted.   Marland Kitchen  Respiratory: Clear to auscultation with no wheezes or rales. Good inspiratory effort. . Abdomen: Soft nontender nondistended with normal bowel sounds x4 quadrants. . Musculoskeletal: Trace lower extremity edema bilaterally.   Marland Kitchen Psychiatry: Mood is appropriate for condition and setting   Data Reviewed: CBC: Recent Labs  Lab 02/27/20 0331 02/27/20 0331 02/27/20 1649 02/27/20 1649 02/27/20 2334 02/27/20 2334 02/28/20 0244 02/28/20 0838  02/28/20 1523 02/28/20 2039 02/29/20 0255  WBC 8.0  --  6.4  --  7.2  --  7.3  --   --   --  7.1  NEUTROABS 4.8  --  4.2  --  4.4  --  4.5  --   --   --  4.7  HGB 7.5*   < > 8.9*   < > 8.5*   < > 8.4*  8.3* 9.1* 8.2* 8.5* 7.6*  7.6*  HCT 23.9*   < > 26.7*   < > 25.0*   < > 25.5*  25.3* 26.3* 25.0* 25.0* 23.1*  22.8*  MCV 90.9  --  89.0  --  87.7  --  88.2  --   --   --  89.1  PLT 161  --  65*  --  129*  --  153  --   --   --  162   < > = values in this interval not displayed.   Basic Metabolic Panel: Recent Labs  Lab 02/25/20 1519 02/26/20 0705 02/27/20 0331 02/28/20 0244 02/29/20 0255  NA 139 139 145 142 140  K 4.4 4.4 4.1 3.2* 3.3*  CL 102 108 112* 108 106  CO2 30 24 26 27 28   GLUCOSE 106* 127* 103* 97 108*  BUN 8 12 10 6  <5*  CREATININE 0.68 0.75 0.72 0.59 0.75  CALCIUM 10.1 9.7 9.7 9.6 9.4  MG  --  2.0 1.8 1.8 1.6*   GFR: Estimated Creatinine Clearance: 66.2 mL/min (by C-G formula based on SCr of 0.75 mg/dL). Liver Function Tests: Recent Labs  Lab 02/25/20 1519  AST 15  ALT 14  ALKPHOS 48  BILITOT 0.3  PROT 6.6  ALBUMIN 3.7   Recent Labs  Lab 02/26/20 0705  LIPASE 31   No results for input(s): AMMONIA in the last 168 hours. Coagulation Profile: Recent Labs  Lab 02/25/20 1519  INR 1.0   Cardiac Enzymes: No results for input(s): CKTOTAL, CKMB, CKMBINDEX, TROPONINI in the last 168 hours. BNP (last 3 results) No results for input(s): PROBNP in the last 8760 hours. HbA1C: No results for input(s): HGBA1C in the last 72 hours. CBG: Recent Labs  Lab 02/28/20 1624 02/28/20 1941 02/28/20 2325 02/29/20 0335 02/29/20 0738  GLUCAP 90 85 91 99 99   Lipid Profile: No results for input(s): CHOL, HDL, LDLCALC, TRIG, CHOLHDL, LDLDIRECT in the last 72 hours. Thyroid Function Tests: No results for input(s): TSH, T4TOTAL, FREET4, T3FREE, THYROIDAB in the last 72 hours. Anemia Panel: No results for input(s): VITAMINB12, FOLATE, FERRITIN, TIBC, IRON,  RETICCTPCT in the last 72 hours. Urine analysis:    Component Value Date/Time   COLORURINE YELLOW 08/06/2019 0817   APPEARANCEUR HAZY (A) 08/06/2019 0817   LABSPEC 1.020 08/06/2019 0817   PHURINE 5.0 08/06/2019 0817   GLUCOSEU NEGATIVE 08/06/2019 0817   HGBUR NEGATIVE 08/06/2019 0817   BILIRUBINUR NEGATIVE 08/06/2019 0817   KETONESUR NEGATIVE 08/06/2019 0817   PROTEINUR NEGATIVE 08/06/2019 0817   NITRITE NEGATIVE 08/06/2019 0817   LEUKOCYTESUR SMALL (A) 08/06/2019 0817   Sepsis Labs: @LABRCNTIP (procalcitonin:4,lacticidven:4)  ) Recent  Results (from the past 240 hour(s))  Respiratory Panel by RT PCR (Flu A&B, Covid) - Nasopharyngeal Swab     Status: None   Collection Time: 02/25/20  3:19 PM   Specimen: Nasopharyngeal Swab  Result Value Ref Range Status   SARS Coronavirus 2 by RT PCR NEGATIVE NEGATIVE Final    Comment: (NOTE) SARS-CoV-2 target nucleic acids are NOT DETECTED.  The SARS-CoV-2 RNA is generally detectable in upper respiratoy specimens during the acute phase of infection. The lowest concentration of SARS-CoV-2 viral copies this assay can detect is 131 copies/mL. A negative result does not preclude SARS-Cov-2 infection and should not be used as the sole basis for treatment or other patient management decisions. A negative result may occur with  improper specimen collection/handling, submission of specimen other than nasopharyngeal swab, presence of viral mutation(s) within the areas targeted by this assay, and inadequate number of viral copies (<131 copies/mL). A negative result must be combined with clinical observations, patient history, and epidemiological information. The expected result is Negative.  Fact Sheet for Patients:  https://www.moore.com/https://www.fda.gov/media/142436/download  Fact Sheet for Healthcare Providers:  https://www.young.biz/https://www.fda.gov/media/142435/download  This test is no t yet approved or cleared by the [place] FDA and  has been authorized for detection  and/or diagnosis of SARS-CoV-2 by FDA under an Emergency Use Authorization (EUA). This EUA will remain  in effect (meaning this test can be used) for the duration of the COVID-19 declaration under Section 564(b)(1) of the Act, 21 U.S.C. section 360bbb-3(b)(1), unless the authorization is terminated or revoked sooner.     Influenza A by PCR NEGATIVE NEGATIVE Final   Influenza B by PCR NEGATIVE NEGATIVE Final    Comment: (NOTE) The Xpert Xpress SARS-CoV-2/FLU/RSV assay is intended as an aid in  the diagnosis of influenza from Nasopharyngeal swab specimens and  should not be used as a sole basis for treatment. Nasal washings and  aspirates are unacceptable for Xpert Xpress SARS-CoV-2/FLU/RSV  testing.  Fact Sheet for Patients: https://www.moore.com/https://www.fda.gov/media/142436/download  Fact Sheet for Healthcare Providers: https://www.young.biz/https://www.fda.gov/media/142435/download  This test is not yet approved or cleared by the [place] FDA and  has been authorized for detection and/or diagnosis of SARS-CoV-2 by  FDA under an Emergency Use Authorization (EUA). This EUA will remain  in effect (meaning this test can be used) for the duration of the  Covid-19 declaration under Section 564(b)(1) of the Act, 21  U.S.C. section 360bbb-3(b)(1), unless the authorization is  terminated or revoked. Performed at [facility], [address] [place], [place] [address]   MRSA PCR Screening     Status: None   Collection Time: 02/25/20  9:25 PM   Specimen: Nasal Mucosa; Nasopharyngeal  Result Value Ref Range Status   MRSA by PCR NEGATIVE NEGATIVE Final    Comment:        The GeneXpert MRSA Assay (FDA approved for NASAL specimens only), is one component of a comprehensive MRSA colonization surveillance program. It is not intended to diagnose MRSA infection nor to guide or monitor treatment for MRSA infections. Performed at [facility], [address] [place], [place] [address]        Studies: No results found.  Scheduled Meds: . sodium chloride   Intravenous Once  . busPIRone  5 mg Oral Q12H  . Chlorhexidine Gluconate Cloth  6 each Topical Daily  . cloNIDine  0.1 mg Oral BID  . insulin aspart  0-9 Units Subcutaneous Q4H  . mouth rinse  15 mL Mouth Rinse  BID  . sodium chloride flush  3 mL Intravenous Q12H    Continuous Infusions: . sodium chloride Stopped (02/28/20 1109)     LOS: 4 days     Darlin Drop, MD [facility] Pager [phone]  If 7PM-7AM, please contact night-coverage [email or web address] Password [facility] 02/29/2020, 9:28 AM

## 2020-03-01 ENCOUNTER — Inpatient Hospital Stay (HOSPITAL_COMMUNITY): Payer: 59

## 2020-03-01 DIAGNOSIS — R079 Chest pain, unspecified: Secondary | ICD-10-CM

## 2020-03-01 LAB — HEMOGLOBIN AND HEMATOCRIT, BLOOD
HCT: 26.8 % — ABNORMAL LOW (ref 36.0–46.0)
HCT: 24.9 % — ABNORMAL LOW (ref 36.0–46.0)
HCT: 23.6 % — ABNORMAL LOW (ref 36.0–46.0)
Hemoglobin: 8.7 g/dL — ABNORMAL LOW (ref 12.0–15.0)
Hemoglobin: 8.2 g/dL — ABNORMAL LOW (ref 12.0–15.0)
Hemoglobin: 7.6 g/dL — ABNORMAL LOW (ref 12.0–15.0)

## 2020-03-01 LAB — ECHOCARDIOGRAM COMPLETE
Area-P 1/2: 2.16 cm2
Height: 61 in
S' Lateral: 2.1 cm
Weight: 2292.78 oz

## 2020-03-01 LAB — BASIC METABOLIC PANEL
Anion gap: 8 (ref 5–15)
BUN: 7 mg/dL (ref 6–20)
CO2: 28 mmol/L (ref 22–32)
Calcium: 9.8 mg/dL (ref 8.9–10.3)
Chloride: 104 mmol/L (ref 98–111)
Creatinine, Ser: 0.59 mg/dL (ref 0.44–1.00)
GFR, Estimated: >60 mL/min (ref >60)
Glucose, Bld: 106 mg/dL — ABNORMAL HIGH (ref 70–99)
Potassium: 3.8 mmol/L (ref 3.5–5.1)
Sodium: 140 mmol/L (ref 135–145)

## 2020-03-01 LAB — GLUCOSE, CAPILLARY
Glucose-Capillary: 100 mg/dL — ABNORMAL HIGH (ref 70–99)
Glucose-Capillary: 103 mg/dL — ABNORMAL HIGH (ref 70–99)
Glucose-Capillary: 108 mg/dL — ABNORMAL HIGH (ref 70–99)
Glucose-Capillary: 138 mg/dL — ABNORMAL HIGH (ref 70–99)
Glucose-Capillary: 94 mg/dL (ref 70–99)
Glucose-Capillary: 97 mg/dL (ref 70–99)

## 2020-03-01 LAB — CBC WITH DIFFERENTIAL/PLATELET
Abs Immature Granulocytes: 0.02 10*3/uL (ref 0.00–0.07)
Basophils Absolute: 0 10*3/uL (ref 0.0–0.1)
Basophils Relative: 0 %
Eosinophils Absolute: 0.1 10*3/uL (ref 0.0–0.5)
Eosinophils Relative: 2 %
HCT: 22.5 % — ABNORMAL LOW (ref 36.0–46.0)
Hemoglobin: 7.5 g/dL — ABNORMAL LOW (ref 12.0–15.0)
Immature Granulocytes: 0 %
Lymphocytes Relative: 25 %
Lymphs Abs: 1.6 10*3/uL (ref 0.7–4.0)
MCH: 30.1 pg (ref 26.0–34.0)
MCHC: 33.3 g/dL (ref 30.0–36.0)
MCV: 90.4 fL (ref 80.0–100.0)
Monocytes Absolute: 0.5 10*3/uL (ref 0.1–1.0)
Monocytes Relative: 7 %
Neutro Abs: 4.3 10*3/uL (ref 1.7–7.7)
Neutrophils Relative %: 66 %
Platelets: 175 10*3/uL (ref 150–400)
RBC: 2.49 MIL/uL — ABNORMAL LOW (ref 3.87–5.11)
RDW: 14.6 % (ref 11.5–15.5)
WBC: 6.5 10*3/uL (ref 4.0–10.5)
nRBC: 0 % (ref 0.0–0.2)

## 2020-03-01 LAB — TROPONIN I (HIGH SENSITIVITY)
Troponin I (High Sensitivity): 12 ng/L (ref <18)
Troponin I (High Sensitivity): 9 ng/L (ref <18)
Troponin I (High Sensitivity): 9 ng/L (ref <18)

## 2020-03-01 LAB — IRON AND TIBC
Iron: 64 ug/dL (ref 28–170)
Saturation Ratios: 27 % (ref 10.4–31.8)
TIBC: 241 ug/dL — ABNORMAL LOW (ref 250–450)
UIBC: 177 ug/dL

## 2020-03-01 LAB — FERRITIN: Ferritin: 23 ng/mL (ref 11–307)

## 2020-03-01 LAB — MAGNESIUM: Magnesium: 2.3 mg/dL (ref 1.7–2.4)

## 2020-03-01 MED ORDER — MORPHINE SULFATE (PF) 2 MG/ML IV SOLN: 2.0000 mg | Freq: Once | INTRAVENOUS | Status: DC

## 2020-03-01 MED ORDER — FERROUS SULFATE 325 (65 FE) MG PO TABS: 325.0000 mg | ORAL_TABLET | Freq: Every day | ORAL | 0 refills | Status: DC

## 2020-03-01 MED ORDER — CLONIDINE HCL 0.1 MG PO TABS: 0.1000 mg | ORAL_TABLET | Freq: Two times a day (BID) | ORAL | 0 refills | Status: DC

## 2020-03-01 MED ORDER — HYDRALAZINE HCL 50 MG PO TABS
100.0000 mg | ORAL_TABLET | Freq: Two times a day (BID) | ORAL | Status: DC
  Administered 2020-03-01 – 2020-03-04 (×7): 100 mg via ORAL
  Filled 2020-03-01 (×8): qty 2

## 2020-03-01 MED ORDER — MORPHINE SULFATE (PF) 2 MG/ML IV SOLN
1.0000 mg | Freq: Once | INTRAVENOUS | Status: AC
  Administered 2020-03-01: 1 mg via INTRAVENOUS
  Filled 2020-03-01 (×2): qty 1

## 2020-03-01 MED ORDER — HYDRALAZINE HCL 50 MG PO TABS
100.0000 mg | ORAL_TABLET | Freq: Three times a day (TID) | ORAL | Status: DC
  Administered 2020-03-01: 100 mg via ORAL
  Filled 2020-03-01: qty 2

## 2020-03-01 MED ORDER — FERROUS SULFATE 325 (65 FE) MG PO TABS
325.0000 mg | ORAL_TABLET | Freq: Every day | ORAL | Status: DC
  Administered 2020-03-01 – 2020-03-02 (×2): 325 mg via ORAL
  Filled 2020-03-01 (×2): qty 1

## 2020-03-01 MED ORDER — HYDRALAZINE HCL 100 MG PO TABS: 100.0000 mg | ORAL_TABLET | Freq: Two times a day (BID) | ORAL | 0 refills | Status: AC

## 2020-03-01 MED ORDER — METHOCARBAMOL 500 MG PO TABS
500.0000 mg | ORAL_TABLET | Freq: Three times a day (TID) | ORAL | Status: AC | PRN
  Administered 2020-03-01 – 2020-03-04 (×2): 500 mg via ORAL
  Filled 2020-03-01 (×2): qty 1

## 2020-03-01 MED ORDER — GABAPENTIN 100 MG PO CAPS
100.0000 mg | ORAL_CAPSULE | Freq: Three times a day (TID) | ORAL | Status: AC
  Administered 2020-03-01 – 2020-03-02 (×2): 100 mg via ORAL
  Filled 2020-03-01 (×2): qty 1

## 2020-03-01 MED ORDER — ACETAMINOPHEN 325 MG PO TABS
650.0000 mg | ORAL_TABLET | Freq: Four times a day (QID) | ORAL | Status: DC | PRN
  Administered 2020-03-01 – 2020-03-04 (×3): 650 mg via ORAL
  Filled 2020-03-01 (×4): qty 2

## 2020-03-01 NOTE — TOC Progression Note (Addendum)
Transition of Care [facility]) - Progression Note    Patient Details  Name: Haley Hernandez MRN: [id number] Date of Birth: 1960-05-22  Transition of Care [facility] CM/SW Contact  Golda Acre, RN Phone Number: 03/01/2020, 3:07 PM  Clinical Narrative:    Request for hhc sent to advanced home [facility].spoke with Leonides Sake   Expected Discharge Plan: Home w Home Health Services Barriers to Discharge: Barriers Resolved  Expected Discharge Plan and Services Expected Discharge Plan: Home w Home Health Services   Discharge Planning Services: CM Consult   Living arrangements for the past 2 months: Single Family Home Expected Discharge Date: 03/01/20                                     Social Determinants of Health (SDOH) Interventions    Readmission Risk Interventions No flowsheet data found.

## 2020-03-01 NOTE — Progress Notes (Signed)
DC delayed.    Patient reports left sided chest pain 8/10, states it is sore, non radiating and not reproducible on palpation.  12 lead EKG reviewed, no evidence of acute ischemia.  Will obtain troponin S, cycle cardiac enzymes, and monitor on telemetry for 24 hours.    If work up is negative will likely dc tomorrow AM.

## 2020-03-01 NOTE — Evaluation (Signed)
Occupational Therapy Evaluation Patient Details Name: Haley Hernandez MRN: [id number] DOB: [birth year] Today's Date: 03/01/2020    History of Present Illness 59  year-old female ,lives at home with her daughter, had intraventricular hemorrhage in March 2021 with left-sided hemiplegia, history of COPD ,obstructive sleep apnea ,to ER for  3 episodes of bright red bleeding per rectum..First colonoscopy done on 02/28/2020 by [facility] with findings consistent with diverticular bleed: Inadequate preparation of the colon, congested erythematous and ulcerated mucosa in the proximal ascending colon and the cecum which were biopsied.  Diverticulosis in the sigmoid colon, in the descending colon and in the ascending colon.  Internal hemorrhoids.   Clinical Impression   Haley Hernandez is a 59 year old woman with above medical history who presents today supine in bed. On evaluation she exhibits dense left sided hemiplegia and mild dysarthria. She required min assist for transfers and assistance for ADLs. Patient reports no new deficits. Reports her left side "feels tight" and is grateful to be up in chair. At this time patient is at her baseline requiring assistance for transfers and ADLs and has assistance of daughter at home. No OT needs at this time.   Follow Up Recommendations  No OT follow up    Equipment Recommendations  None recommended by OT    Recommendations for Other Services       Precautions / Restrictions Precautions Precautions: Fall Precaution Comments: dense left hemiplegia Restrictions Weight Bearing Restrictions: No      Mobility Bed Mobility Overal bed mobility: Needs Assistance Bed Mobility: Supine to Sit     Supine to sit: Min assist;HOB elevated     General bed mobility comments: use of bed rail, assist to slide LLE to bed edge, patient pushed self into sitting from right side    Transfers Overall transfer level: Needs assistance   Transfers: Squat Pivot  Transfers     Squat pivot transfers: Min assist     General transfer comment: patienat able to reach for recliner armrest and transfer to recliner with squat pivot, min assistance. Partially stood to scoot to right in recliner.    Balance Overall balance assessment: Needs assistance Sitting-balance support: Feet supported;Single extremity supported Sitting balance-[name]: Fair         Standing balance comment: did not stand                           ADL either performed or assessed with clinical judgement   ADL Overall ADL's : At baseline                                             Vision Baseline Vision/History: Wears glasses Wears Glasses: At all times       Perception     Praxis      Pertinent Vitals/Pain Pain Assessment:  (Reports her Left side feels tight but does not describe it as pain.)     Hand Dominance Right   Extremity/Trunk Assessment Upper Extremity Assessment Upper Extremity Assessment: LUE deficits/detail;RUE deficits/detail RUE Deficits / Details: WFL LUE Deficits / Details: No active movement, elbow flexed, forearm supinated, fingers flexed - requires soft premade splint for positioning that therapist donned. LUE Sensation: decreased light touch (reports numbness.) LUE Coordination:  (No coordination.)   Lower Extremity Assessment Lower Extremity Assessment: Defer to PT evaluation LLE  Deficits / Details: flaccid generally, no volitional movement       Communication Communication Communication: No difficulties   Cognition Arousal/Alertness: Awake/alert Behavior During Therapy: WFL for tasks assessed/performed Overall Cognitive Status: History of cognitive impairments - at baseline                                 General Comments: decreased memory at basline per patient   General Comments       Exercises     Shoulder Instructions      Home Living Family/patient expects to be  discharged to:: Private residence Living Arrangements: Children Available Help at Discharge: Family;Available 24 hours/day Type of Home: House Home Access: Stairs to enter;Other (comment) (able to use other entrance with one step) Entrance Stairs-Number of Steps: 2   Home Layout: Multi-level;Able to live on main level with bedroom/bathroom     Bathroom Shower/Tub: Producer, television/film/video: Standard Bathroom Accessibility: Yes How Accessible: Accessible via wheelchair Home Equipment: Wheelchair - manual;Bedside commode;Shower seat          Prior Functioning/Environment Level of Independence: Needs assistance  Gait / Transfers Assistance Needed: unable to manage wc by herself. ADL's / Homemaking Assistance Needed: assistance with bathing and dressing. predominantly bathes in the bed. Communication / Swallowing Assistance Needed: mild dysarthria          OT Problem List: Decreased strength;Decreased range of motion;Impaired balance (sitting and/or standing);Impaired UE functional use      OT Treatment/Interventions:      OT Goals(Current goals can be found in the care plan section) Acute Rehab OT Goals Patient Stated Goal: to go home OT Goal Formulation: Patient unable to participate in goal setting  OT Frequency:     Barriers to D/C:            Co-evaluation              AM-PAC OT "6 Clicks" Daily Activity     Outcome Measure Help from another person eating meals?: A Little Help from another person taking care of personal grooming?: A Little Help from another person toileting, which includes using toliet, bedpan, or urinal?: A Lot Help from another person bathing (including washing, rinsing, drying)?: A Lot Help from another person to put on and taking off regular upper body clothing?: A Lot Help from another person to put on and taking off regular lower body clothing?: Total 6 Click Score: 13   End of Session Equipment Utilized During Treatment:  Gait belt Nurse Communication: Mobility status  Activity Tolerance: Patient tolerated treatment well Patient left: in chair;with call bell/phone within reach;with chair alarm set  OT Visit Diagnosis: Muscle weakness (generalized) (M62.81)                Time: 7169-6789 OT Time Calculation (min): 20 min Charges:  OT General Charges $OT Visit: 1 Visit OT Evaluation $OT Eval Moderate Complexity: 1 Mod  Praveen Coia, OTR/L Acute Care Rehab Services  Office [phone] Pager: [phone]   Kelli Churn 03/01/2020, 1:22 PM

## 2020-03-01 NOTE — Progress Notes (Deleted)
Cardiology Office Note:    Date:  03/01/2020   ID:  Haley Hernandez, DOB [birth year], MRN [id number]  PCP:  Benita Stabile, MD  Cardiologist:  No primary care provider on file.  Electrophysiologist:  None   Referring MD: Benita Stabile, MD   No chief complaint on file. ***  History of Present Illness:    Haley Hernandez is a 59 y.o. female with a hx of COPD, T2DM, hypertension, ICH, OSA  She had ICH in 2014 and again 07/18/2019, with intraventricular hemorrhage status post EVD placement.  She was admitted on 02/25/2020 with rectal bleeding.  Colonoscopy on 10/26 showed diverticular bleed.  She received 3 units PRBCs.  She was seen in the ED on 02/11/2020 for chest pain.  Troponins negative x2, no concerning ischemic EKG findings.  She was discharged with cardiology follow-up.  Echocardiogram 07/19/2019 showed LVEF 60 to 65%, severe LVH,  Past Medical History:  Diagnosis Date  . COPD 06/27/2008   Qualifier: Diagnosis of  By: Craige Cotta MD, Vineet    . Diabetes mellitus without complication (HCC)   . Hypertension   . IVH (intraventricular hemorrhage) (HCC) 07/18/2019  . OBSTRUCTIVE SLEEP APNEA 06/27/2008   Qualifier: Diagnosis of  By: Craige Cotta MD, Vineet    . Stroke [facility])     Past Surgical History:  Procedure Laterality Date  . ABDOMINAL HYSTERECTOMY    . BIOPSY  02/28/2020   Procedure: BIOPSY;  Surgeon: Charlott Rakes, MD;  Location: [facility] ENDOSCOPY;  Service: Endoscopy;;  . COLONOSCOPY N/A 02/28/2020   Procedure: COLONOSCOPY;  Surgeon: Charlott Rakes, MD;  Location: [facility] ENDOSCOPY;  Service: Endoscopy;  Laterality: N/A;  . IR GASTROSTOMY TUBE MOD SED  08/03/2019  . IR GASTROSTOMY TUBE REMOVAL  11/21/2019    Current Medications: No outpatient medications have been marked as taking for the 03/02/20 encounter (Appointment) with Little Ishikawa, MD.     Allergies:   Losartan, Norvasc [amlodipine], Ace inhibitors, and Penicillins   Social History   Socioeconomic History  . Marital  status: Single    Spouse name: Not on file  . Number of children: Not on file  . Years of education: Not on file  . Highest education level: Not on file  Occupational History  . Not on file  Tobacco Use  . Smoking status: Never Smoker  . Smokeless tobacco: Never Used  Substance and Sexual Activity  . Alcohol use: Not Currently  . Drug use: Not Currently  . Sexual activity: Not on file  Other Topics Concern  . Not on file  Social History Narrative  . Not on file   Social Determinants of Health   Financial Resource Strain:   . Difficulty of Paying Living Expenses: Not on file  Food Insecurity:   . Worried About Programme researcher, broadcasting/film/video in the Last Year: Not on file  . Ran Out of Food in the Last Year: Not on file  Transportation Needs:   . Lack of Transportation (Medical): Not on file  . Lack of Transportation (Non-Medical): Not on file  Physical Activity:   . Days of Exercise per Week: Not on file  . Minutes of Exercise per Session: Not on file  Stress:   . Feeling of Stress : Not on file  Social Connections:   . Frequency of Communication with Friends and Family: Not on file  . Frequency of Social Gatherings with Friends and Family: Not on file  . Attends Religious Services: Not on file  . Active Member of  Clubs or Organizations: Not on file  . Attends Banker Meetings: Not on file  . Marital Status: Not on file     Family History: The patient's ***family history includes Diabetes in her father; Hypertension in her father.  ROS:   Please see the history of present illness.    *** All other systems reviewed and are negative.  EKGs/Labs/Other Studies Reviewed:    The following studies were reviewed today: ***  EKG:  EKG is *** ordered today.  The ekg ordered today demonstrates ***  Recent Labs: 02/25/2020: ALT 14 03/01/2020: BUN 7; Creatinine, Ser 0.59; Hemoglobin 8.7; Magnesium 2.3; Platelets 175; Potassium 3.8; Sodium 140  Recent Lipid Panel      Component Value Date/Time   CHOL 180 07/20/2019 0556   TRIG 201 (H) 07/30/2019 0637   HDL 45 07/20/2019 0556   CHOLHDL 4.0 07/20/2019 0556   VLDL 72 (H) 07/20/2019 0556   LDLCALC 63 07/20/2019 0556    Physical Exam:    VS:  There were no vitals taken for this visit.    Wt Readings from Last 3 Encounters:  02/25/20 143 lb 4.8 oz (65 kg)  10/20/19 140 lb (63.5 kg)  10/11/19 154 lb 1.6 oz (69.9 kg)     GEN: *** Well nourished, well developed in no acute distress HEENT: Normal NECK: No JVD; No carotid bruits LYMPHATICS: No lymphadenopathy CARDIAC: ***RRR, no murmurs, rubs, gallops RESPIRATORY:  Clear to auscultation without rales, wheezing or rhonchi  ABDOMEN: Soft, non-tender, non-distended MUSCULOSKELETAL:  No edema; No deformity  SKIN: Warm and dry NEUROLOGIC:  Alert and oriented x 3 PSYCHIATRIC:  Normal affect   ASSESSMENT:    No diagnosis found. PLAN:     Chest pain:  LVH: Severe on echocardiogram, evaluate with cardiac MRI for amyloidosis.  CVA: ICH in 2014 and again 07/18/2019 with IVH status post EVD placement.  Residual left hemiplegia.  Neurology has recommended avoiding aspirin given recurrent ICH.  Hypertension: On clonidine 0.1 mg twice daily, hydralazine 100 mg twice daily, ?labetalol  T2DM:  Rectal bleeding.  Recent admission for rectal bleeding, thought to be diverticular bleed  RTC in ***   Medication Adjustments/Labs and Tests Ordered: Current medicines are reviewed at length with the patient today.  Concerns regarding medicines are outlined above.  No orders of the defined types were placed in this encounter.  No orders of the defined types were placed in this encounter.   There are no Patient Instructions on file for this visit.   Signed, Little Ishikawa, MD  03/01/2020 11:45 AM    [facility]

## 2020-03-01 NOTE — Progress Notes (Signed)
Chaplain engaged in initial visit with [name].  Chaplain explained role and offered support.  During visit, Haley Hernandez voiced that she was tired and ready to be back in her own bed.  Chaplain affirmed her need for rest.  She also shared that she wanted to be able to brush her teeth.  Chaplain followed up with nurse, letting her know of this request.  Chaplain will follow-up as needed.    03/01/20 1000  Clinical Encounter Type  Visited With Patient  Visit Type Initial

## 2020-03-01 NOTE — Progress Notes (Signed)
  Echocardiogram 2D Echocardiogram has been performed.  Celene Skeen 03/01/2020, 4:22 PM

## 2020-03-01 NOTE — Evaluation (Signed)
Physical Therapy Evaluation Patient Details Name: Haley Hernandez MRN: [id number] DOB: [birth year] Today's Date: 03/01/2020   History of Present Illness  59  year-old female ,lives at home with her daughter, had intraventricular hemorrhage in March 2021 with left-sided hemiplegia, history of COPD ,obstructive sleep apnea ,to ER for  3 episodes of bright red bleeding per rectum..First colonoscopy done on 02/28/2020 by [facility] with findings consistent with diverticular bleed: Inadequate preparation of the colon, congested erythematous and ulcerated mucosa in the proximal ascending colon and the cecum which were biopsied.  Diverticulosis in the sigmoid colon, in the descending colon and in the ascending colon.  Internal hemorrhoids.  Clinical Impression  The patient mobilized with Minimal assistance  To sitting then to pivot to recliner. Patient requires min  Assistance at baseline. Patient nonambulatory  PTA, used WC for mobility, daughter assisting. Patient is to DC to home. Recommend HHPT for ensuring that patient is returning to her baseline.If patient is not DC'd, PT will continue while in /acute care.    Follow Up Recommendations Home health PT    Equipment Recommendations  None recommended by PT    Recommendations for Other Services       Precautions / Restrictions Precautions Precautions: Fall Precaution Comments: dense left hemiplegia Restrictions Weight Bearing Restrictions: No      Mobility  Bed Mobility Overal bed mobility: Needs Assistance Bed Mobility: Supine to Sit     Supine to sit: Min assist;HOB elevated     General bed mobility comments: use of bed rail, assist to slide LLE to bed edge, patient pushed seklf to sitting from right side    Transfers Overall transfer level: Needs assistance   Transfers: Squat Pivot Transfers     Squat pivot transfers: Min assist     General transfer comment: patienat able to reach for recliner armrest and transfer to  recliner with squat pivot, min assistance. Partially stood to scoot to right in recliner.  Ambulation/Gait                Stairs            Wheelchair Mobility    Modified Rankin (Stroke Patients Only)       Balance Overall balance assessment: Needs assistance Sitting-balance support: Feet supported;Single extremity supported Sitting balance-[name]: Fair         Standing balance comment: did not stand                             Pertinent Vitals/Pain Pain Assessment: No/denies pain    Home Living Family/patient expects to be discharged to:: Private residence Living Arrangements: Children Available Help at Discharge: Family;Available 24 hours/day Type of Home: House Home Access: Stairs to enter;Other (comment) (able to use other entrance with one step)   Entrance Stairs-Number of Steps: 2 Home Layout: Multi-level;Able to live on main level with bedroom/bathroom Home Equipment: Wheelchair - manual;Bedside commode;Shower seat      Prior Function Level of Independence: Needs assistance   Gait / Transfers Assistance Needed: unable to manage wc by herself.  ADL's / Homemaking Assistance Needed: assistance with bathing and dressing. predominantly bathes in the bed.        Hand Dominance   Dominant Hand: Right    Extremity/Trunk Assessment   Upper Extremity Assessment Upper Extremity Assessment: Defer to OT evaluation    Lower Extremity Assessment Lower Extremity Assessment: LLE deficits/detail LLE Deficits / Details: flaccid generally, no volitional movement  Communication   Communication: No difficulties  Cognition   Behavior During Therapy: WFL for tasks assessed/performed Overall Cognitive Status: History of cognitive impairments - at baseline                                 General Comments: decreased memory at basline per patient      General Comments      Exercises     Assessment/Plan     PT Assessment All further PT needs can be met in the next venue of care (to Dc home)  PT Problem List Decreased strength;Decreased cognition;Decreased knowledge of use of DME;Impaired tone;Decreased activity tolerance;Decreased mobility       PT Treatment Interventions      PT Goals (Current goals can be found in the Care Plan section)  Acute Rehab PT Goals Patient Stated Goal: to go home PT Goal Formulation: All assessment and education complete, DC therapy    Frequency     Barriers to discharge        Co-evaluation               AM-PAC PT "6 Clicks" Mobility  Outcome Measure Help needed turning from your back to your side while in a flat bed without using bedrails?: A Little Help needed moving from lying on your back to sitting on the side of a flat bed without using bedrails?: A Little Help needed moving to and from a bed to a chair (including a wheelchair)?: A Little Help needed standing up from a chair using your arms (e.g., wheelchair or bedside chair)?: A Lot Help needed to walk in hospital room?: Total Help needed climbing 3-5 steps with a railing? : Total 6 Click Score: 13    End of Session Equipment Utilized During Treatment: Gait belt Activity Tolerance: Patient tolerated treatment well Patient left: in chair;with call bell/phone within reach Nurse Communication: Mobility status PT Visit Diagnosis: Unsteadiness on feet (R26.81);Other symptoms and signs involving the nervous system (R29.898);Hemiplegia and hemiparesis Hemiplegia - Right/Left: Left Hemiplegia - dominant/non-dominant: Non-dominant Hemiplegia - caused by: Other Nontraumatic intracranial hemorrhage    Time: 9211-9417 PT Time Calculation (min) (ACUTE ONLY): 21 min   Charges:   PT Evaluation $PT Eval Low Complexity: [place] PT Acute Rehabilitation Services Pager [phone] Office [phone]  Claretha Cooper 03/01/2020, 11:42 AM

## 2020-03-01 NOTE — Progress Notes (Signed)
PROGRESS NOTE  Haley Hernandez OJJ:[id number] DOB: 09-29-1960 DOA: 02/25/2020 PCP: Benita Stabile, MD  HPI/Recap of past 60 hours: 59 year-old female with past medical history significant for COPD, intraventricular hemorrhage with left-sided hemiplegia, obstructive sleep apnea, essential hypertension, chronic anxiety who initially presented to [facility], brought in by her daughter from home with 3 episodes of bright red blood per rectum.  She was transferred to [facility] where she had additionally 5-6 large bloody stools.  She is not on any anticoagulation at home.  [facility] was consulted.  Had a colonoscopy on 02/28/20 by Dr. Bosie Clos, which revealed congested, erythematous and ulcerated mucosa in the proximal ascending colon and in the cecum.  Biopsied.  Diverticulosis in the sigmoid colon, in the descending colon and in the ascending colon.  Internal hemorrhoids.  Pathology returned with no evidence of malignancy. FINAL MICROSCOPIC DIAGNOSIS:   A. COLON, CECUM, BIOPSY:  - Colonic mucosa with focal hyperemia.  - No inflammatory changes, adenomatous change or malignancy.   No recurrent GI bleed.  Last bowel movement was on 02/28/20.  Hospital course complicated by atypical chest pain on 03/01/20.  Updated her daughter via phone who stated that the patient was having chest pain more frequently at home, in the past 2 weeks prior to her presentation.  She is scheduled to have her first post hospitalization appointment with cardiology, Dr. Carmon Ginsberg, on 03/02/20 at 3:40PM.  12 lead EKG obtained, no evidence of acute ischemia.  Troponin S pending.  03/01/20:  Discharge to home was delayed today due to sudden onset atypical chest pain, at rest, left sided, dull 8/10 and non radiating.  No dyspnea.  Ongoing workup for chest pain.  IV morphine given.  Cardiology consulted.  Assessment/Plan: Principal Problem:   Rectal bleeding Active Problems:   Type 2 diabetes mellitus with neurological  complications (HCC)   Hypertension associated with type 2 diabetes mellitus (HCC)   Major depression, recurrent, chronic (HCC)   History of CVA (cerebrovascular accident)  Resolved Lower GI bleed secondary to diverticular bleed Admitted with multiple episodes of hematochezia First colonoscopy done on 02/28/2020 by [facility] with findings consistent with diverticular bleed: Inadequate preparation of the colon, congested erythematous and ulcerated mucosa in the proximal ascending colon and the cecum which were biopsied.  Diverticulosis in the sigmoid colon, in the descending colon and in the ascending colon.  Internal hemorrhoids. Lower GI bleed has resolved. She received 3 units of blood transfusion during this hospital stay. Hemoglobin stable and uptrending 8.7 from 7.6  Will need to follow-up with GI outpatient Biopsy results benign.  Atypical chest pain, unclear etiology Rule out ACS Per her daughter has been recurring more frequently at home in the last 2 weeks Has an appointment with cardiology tomorrow 03/02/2020 at 3:40 PM Twelve-lead EKG no evidence of acute ischemia Troponin is ordered and pending Monitor on telemetry IV morphine as needed Cardiology has been consulted  Resolved acute blood loss anemia secondary to lower GI bleed Management as per above.  Essential hypertension Stop blood pressures this morning Decreased dose of clonidine from 0.3 mg twice daily to 0.1 mg twice daily. She is back on hydralazine 100 mg twice daily Continue to closely monitor vital signs  Transient bradycardia Reported drop in heart rate to the 30s overnight Currently back in sinus rhythm Bradycardia possibly due to clonidine Continue hydralazine Continue to monitor on telemetry  Resolved hypokalemia/hypomagnesemia Repleted Serum potassium 3.8, serum magnesium 2.3  Ambulatory dysfunction Wheelchair-bound PT recommended home  health PT OT     Code Status: Full code  Family  Communication: Updated her daughter via phone.  Disposition Plan: To home with home health services   Consultants:  GI  Cardiology  Procedures:  Colonoscopy  Antimicrobials:  None  DVT prophylaxis: SCDs  Status is: Inpatient   Dispo:  Patient From: Home  Planned Disposition: Home with Health Care Svc  Expected discharge date: 03/01/20  Medically stable for discharge: No, ongoing work-up for chest pain.        Objective: Vitals:   03/01/20 0745 03/01/20 0800 03/01/20 1131 03/01/20 1200  BP:  132/64 109/70   Pulse:  79    Resp:  16 16   Temp: 98.5 F (36.9 C)   98.8 F (37.1 C)  TempSrc: Oral   Oral  SpO2:  95%    Weight:      Height:        Intake/Output Summary (Last 24 hours) at 03/01/2020 1304 Last data filed at 03/01/2020 0400 Gross per 24 hour  Intake 240 ml  Output 800 ml  Net -560 ml   Filed Weights   02/25/20 1407 02/25/20 2125  Weight: 63.5 kg 65 kg    Exam:  . General: 59 y.o. year-old female well-developed well-nourished in no acute distress.  Appears mildly uncomfortable due to left chest pain.  Alert oriented x3. . Cardiovascular: Regular rate and rhythm no rubs or gallops. Marland Kitchen. Respiratory: Clear to auscultation no wheezes or rales. . Abdomen: Soft nontender normal bowel sounds present.  Musculoskeletal: Trace lower extremity edema bilaterally. Marland Kitchen. Psychiatry: Mood is appropriate for condition and setting.   Data Reviewed: CBC: Recent Labs  Lab 02/27/20 1649 02/27/20 1649 02/27/20 2334 02/27/20 2334 02/28/20 0244 02/28/20 [id number] 02/29/20 0255 02/29/20 0255 02/29/20 0950 02/29/20 1551 02/29/20 2045 03/01/20 0237 03/01/20 0851  WBC 6.4  --  7.2  --  7.3  --  7.1  --   --   --   --  6.5  --   NEUTROABS 4.2  --  4.4  --  4.5  --  4.7  --   --   --   --  4.3  --   HGB 8.9*   < > 8.5*   < > 8.4*  8.3*   < > 7.6*  7.6*   < > 7.3* 7.7* 7.3* 7.5* 8.7*  HCT 26.7*   < > 25.0*   < > 25.5*  25.3*   < > 23.1*  22.8*   < > 22.3*  23.2* 22.3* 22.5* 26.8*  MCV 89.0  --  87.7  --  88.2  --  89.1  --   --   --   --  90.4  --   PLT 65*  --  129*  --  153  --  162  --   --   --   --  175  --    < > = values in this interval not displayed.   Basic Metabolic Panel: Recent Labs  Lab 02/26/20 0705 02/27/20 0331 02/28/20 0244 02/29/20 0255 03/01/20 0237  NA 139 145 142 140 140  K 4.4 4.1 3.2* 3.3* 3.8  CL 108 112* 108 106 104  CO2 24 26 27 28 28   GLUCOSE 127* 103* 97 108* 106*  BUN 12 10 6  <5* 7  CREATININE 0.75 0.72 0.59 0.75 0.59  CALCIUM 9.7 9.7 9.6 9.4 9.8  MG 2.0 1.8 1.8 1.6* 2.3   GFR: Estimated Creatinine Clearance: 66.2  mL/min (by C-G formula based on SCr of 0.59 mg/dL). Liver Function Tests: Recent Labs  Lab 02/25/20 1519  AST 15  ALT 14  ALKPHOS 48  BILITOT 0.3  PROT 6.6  ALBUMIN 3.7   Recent Labs  Lab 02/26/20 0705  LIPASE 31   No results for input(s): AMMONIA in the last 168 hours. Coagulation Profile: Recent Labs  Lab 02/25/20 1519  INR 1.0   Cardiac Enzymes: No results for input(s): CKTOTAL, CKMB, CKMBINDEX, TROPONINI in the last 168 hours. BNP (last 3 results) No results for input(s): PROBNP in the last 8760 hours. HbA1C: No results for input(s): HGBA1C in the last 72 hours. CBG: Recent Labs  Lab 02/29/20 1930 02/29/20 2315 03/01/20 0337 03/01/20 0727 03/01/20 1216  GLUCAP 100* 108* 103* 94 108*   Lipid Profile: No results for input(s): CHOL, HDL, LDLCALC, TRIG, CHOLHDL, LDLDIRECT in the last 72 hours. Thyroid Function Tests: No results for input(s): TSH, T4TOTAL, FREET4, T3FREE, THYROIDAB in the last 72 hours. Anemia Panel: Recent Labs    03/01/20 0851  FERRITIN 23  TIBC 241*  IRON 64   Urine analysis:    Component Value Date/Time   COLORURINE YELLOW 08/06/2019 0817   APPEARANCEUR HAZY (A) 08/06/2019 0817   LABSPEC 1.020 08/06/2019 0817   PHURINE 5.0 08/06/2019 0817   GLUCOSEU NEGATIVE 08/06/2019 0817   HGBUR NEGATIVE 08/06/2019 0817   BILIRUBINUR  NEGATIVE 08/06/2019 0817   KETONESUR NEGATIVE 08/06/2019 0817   PROTEINUR NEGATIVE 08/06/2019 0817   NITRITE NEGATIVE 08/06/2019 0817   LEUKOCYTESUR SMALL (A) 08/06/2019 0817   Sepsis Labs: @LABRCNTIP (procalcitonin:4,lacticidven:4)  ) Recent Results (from the past 240 hour(s))  Respiratory Panel by RT PCR (Flu A&B, Covid) - Nasopharyngeal Swab     Status: None   Collection Time: 02/25/20  3:19 PM   Specimen: Nasopharyngeal Swab  Result Value Ref Range Status   SARS Coronavirus 2 by RT PCR NEGATIVE NEGATIVE Final    Comment: (NOTE) SARS-CoV-2 target nucleic acids are NOT DETECTED.  The SARS-CoV-2 RNA is generally detectable in upper respiratoy specimens during the acute phase of infection. The lowest concentration of SARS-CoV-2 viral copies this assay can detect is 131 copies/mL. A negative result does not preclude SARS-Cov-2 infection and should not be used as the sole basis for treatment or other patient management decisions. A negative result may occur with  improper specimen collection/handling, submission of specimen other than nasopharyngeal swab, presence of viral mutation(s) within the areas targeted by this assay, and inadequate number of viral copies (<131 copies/mL). A negative result must be combined with clinical observations, patient history, and epidemiological information. The expected result is Negative.  Fact Sheet for Patients:  02/27/20  Fact Sheet for Healthcare Providers:  https://www.moore.com/  This test is no t yet approved or cleared by the https://www.young.biz/ FDA and  has been authorized for detection and/or diagnosis of SARS-CoV-2 by FDA under an Emergency Use Authorization (EUA). This EUA will remain  in effect (meaning this test can be used) for the duration of the COVID-19 declaration under Section 564(b)(1) of the Act, 21 U.S.C. section 360bbb-3(b)(1), unless the authorization is terminated  or revoked sooner.     Influenza A by PCR NEGATIVE NEGATIVE Final   Influenza B by PCR NEGATIVE NEGATIVE Final    Comment: (NOTE) The Xpert Xpress SARS-CoV-2/FLU/RSV assay is intended as an aid in  the diagnosis of influenza from Nasopharyngeal swab specimens and  should not be used as a sole basis for treatment. Nasal washings and  aspirates are unacceptable for Xpert Xpress SARS-CoV-2/FLU/RSV  testing.  Fact Sheet for Patients: https://www.moore.com/  Fact Sheet for Healthcare Providers: https://www.young.biz/  This test is not yet approved or cleared by the [place] FDA and  has been authorized for detection and/or diagnosis of SARS-CoV-2 by  FDA under an Emergency Use Authorization (EUA). This EUA will remain  in effect (meaning this test can be used) for the duration of the  Covid-19 declaration under Section 564(b)(1) of the Act, 21  U.S.C. section 360bbb-3(b)(1), unless the authorization is  terminated or revoked. Performed at [facility], [address] [place], [place] [address]   MRSA PCR Screening     Status: None   Collection Time: 02/25/20  9:25 PM   Specimen: Nasal Mucosa; Nasopharyngeal  Result Value Ref Range Status   MRSA by PCR NEGATIVE NEGATIVE Final    Comment:        The GeneXpert MRSA Assay (FDA approved for NASAL specimens only), is one component of a comprehensive MRSA colonization surveillance program. It is not intended to diagnose MRSA infection nor to guide or monitor treatment for MRSA infections. Performed at [facility], [address], [place], [place] [address]       Studies: No results found.  Scheduled Meds: . sodium chloride   Intravenous Once  . busPIRone  5 mg Oral Q12H  . Chlorhexidine Gluconate Cloth  6 each Topical Daily  . cloNIDine  0.1 mg Oral BID  . ferrous sulfate  325 mg Oral Q breakfast  . hydrALAZINE  100 mg Oral BID  . insulin aspart   0-9 Units Subcutaneous Q4H  . mouth rinse  15 mL Mouth Rinse BID  .  morphine injection  1 mg Intravenous Once  . sodium chloride flush  3 mL Intravenous Q12H    Continuous Infusions:    LOS: 5 days     Darlin Drop, MD [facility] Pager [phone]  If 7PM-7AM, please contact night-coverage [email or web address] Password [email or web address] 03/01/2020, 1:04 PM

## 2020-03-02 ENCOUNTER — Ambulatory Visit: Payer: 59 | Admitting: Cardiology

## 2020-03-02 DIAGNOSIS — R079 Chest pain, unspecified: Secondary | ICD-10-CM | POA: Diagnosis not present

## 2020-03-02 DIAGNOSIS — Z8673 Personal history of transient ischemic attack (TIA), and cerebral infarction without residual deficits: Secondary | ICD-10-CM

## 2020-03-02 DIAGNOSIS — K625 Hemorrhage of anus and rectum: Secondary | ICD-10-CM | POA: Diagnosis not present

## 2020-03-02 LAB — CBC
HCT: 28.1 % — ABNORMAL LOW (ref 36.0–46.0)
Hemoglobin: 9 g/dL — ABNORMAL LOW (ref 12.0–15.0)
MCH: 29.4 pg (ref 26.0–34.0)
MCHC: 32 g/dL (ref 30.0–36.0)
MCV: 91.8 fL (ref 80.0–100.0)
Platelets: 200 10*3/uL (ref 150–400)
RBC: 3.06 MIL/uL — ABNORMAL LOW (ref 3.87–5.11)
RDW: 15.3 % (ref 11.5–15.5)
WBC: 6 10*3/uL (ref 4.0–10.5)
nRBC: 0 % (ref 0.0–0.2)

## 2020-03-02 LAB — GLUCOSE, CAPILLARY
Glucose-Capillary: 113 mg/dL — ABNORMAL HIGH (ref 70–99)
Glucose-Capillary: 118 mg/dL — ABNORMAL HIGH (ref 70–99)
Glucose-Capillary: 136 mg/dL — ABNORMAL HIGH (ref 70–99)
Glucose-Capillary: 137 mg/dL — ABNORMAL HIGH (ref 70–99)
Glucose-Capillary: 85 mg/dL (ref 70–99)
Glucose-Capillary: 98 mg/dL (ref 70–99)

## 2020-03-02 LAB — HEMOGLOBIN AND HEMATOCRIT, BLOOD
HCT: 22 % — ABNORMAL LOW (ref 36.0–46.0)
HCT: 27.7 % — ABNORMAL LOW (ref 36.0–46.0)
HCT: 30.7 % — ABNORMAL LOW (ref 36.0–46.0)
Hemoglobin: 7.1 g/dL — ABNORMAL LOW (ref 12.0–15.0)
Hemoglobin: 9 g/dL — ABNORMAL LOW (ref 12.0–15.0)
Hemoglobin: 10.2 g/dL — ABNORMAL LOW (ref 12.0–15.0)

## 2020-03-02 LAB — PREPARE RBC (CROSSMATCH)

## 2020-03-02 MED ORDER — NITROGLYCERIN 0.4 MG SL SUBL
0.8000 mg | SUBLINGUAL_TABLET | Freq: Once | SUBLINGUAL | Status: AC
  Administered 2020-03-05: 0.8 mg via SUBLINGUAL

## 2020-03-02 MED ORDER — GABAPENTIN 100 MG PO CAPS
200.0000 mg | ORAL_CAPSULE | Freq: Every day | ORAL | Status: AC
  Administered 2020-03-02: 200 mg via ORAL
  Filled 2020-03-02: qty 2

## 2020-03-02 MED ORDER — VITAMIN D3 25 MCG (1000 UNIT) PO TABS
1000.0000 [IU] | ORAL_TABLET | Freq: Every day | ORAL | Status: DC
  Administered 2020-03-03 – 2020-03-05 (×3): 1000 [IU] via ORAL
  Filled 2020-03-02 (×3): qty 1

## 2020-03-02 MED ORDER — GABAPENTIN 100 MG PO CAPS
100.0000 mg | ORAL_CAPSULE | Freq: Three times a day (TID) | ORAL | Status: DC
  Administered 2020-03-02 – 2020-03-05 (×10): 100 mg via ORAL
  Filled 2020-03-02 (×10): qty 1

## 2020-03-02 MED ORDER — KETOROLAC TROMETHAMINE 30 MG/ML IJ SOLN
30.0000 mg | Freq: Once | INTRAMUSCULAR | Status: AC
  Administered 2020-03-02: 30 mg via INTRAVENOUS
  Filled 2020-03-02: qty 1

## 2020-03-02 MED ORDER — BACLOFEN 5 MG HALF TABLET
5.0000 mg | ORAL_TABLET | Freq: Every evening | ORAL | Status: DC | PRN
  Filled 2020-03-02: qty 2

## 2020-03-02 MED ORDER — SODIUM CHLORIDE 0.9% IV SOLUTION: Freq: Once | INTRAVENOUS | Status: DC

## 2020-03-02 NOTE — Consult Note (Addendum)
Cardiology Consultation:   Patient ID: Haley Hernandez MRN: [id number]; DOB: June 24, 1960  Admit date: 02/25/2020 Date of Consult: 03/02/2020  Primary Care Provider: Benita Stabile, MD [facility] Cardiologist: No primary care provider on file.  [facility] Electrophysiologist:  None    Patient Profile:   Haley Hernandez is a 59 y.o. female  who is being seen today for the evaluation of chest pain at the request of Dr. Margo Aye.  History of Present Illness:   [name] is a 59 year old female with COPD prior stroke with intraventricular hemorrhage obstructive sleep apnea diabetes hypertension chronic anxiety who had 3 episodes of bright red blood per rectum. She had 5-6 large bloody stools on admission. Colonoscopy was performed on 02/28/2020 that showed focal hyperemia.  Thankfully, no recurrent GI bleeds. She was about to be discharged today but then she started to have sudden chest discomfort that she has been having off and on over the last several days/weeks. Dull left-sided 8/10 nonradiating. Morphine was given. We were consulted. She was supposed to have cardiology consultation today as outpatient.   Past Medical History:  Diagnosis Date  . COPD 06/27/2008   Qualifier: Diagnosis of  By: Craige Cotta MD, [place]    . Diabetes mellitus without complication (HCC)   . Hypertension   . IVH (intraventricular hemorrhage) (HCC) 07/18/2019  . OBSTRUCTIVE SLEEP APNEA 06/27/2008   Qualifier: Diagnosis of  By: Craige Cotta MD, [place]    . Stroke [facility])     Past Surgical History:  Procedure Laterality Date  . ABDOMINAL HYSTERECTOMY    . BIOPSY  02/28/2020   Procedure: BIOPSY;  Surgeon: Charlott Rakes, MD;  Location: [facility] ENDOSCOPY;  Service: Endoscopy;;  . COLONOSCOPY N/A 02/28/2020   Procedure: COLONOSCOPY;  Surgeon: Charlott Rakes, MD;  Location: [facility] ENDOSCOPY;  Service: Endoscopy;  Laterality: N/A;  . IR GASTROSTOMY TUBE MOD SED  08/03/2019  . IR GASTROSTOMY TUBE REMOVAL  11/21/2019     Home  Medications:  Prior to Admission medications   Medication Sig Start Date End Date Taking? Authorizing Provider  albuterol (ACCUNEB) 1.25 MG/3ML nebulizer solution Take 1 ampule by nebulization every 6 (six) hours as needed for wheezing.   Yes [provider]  albuterol (VENTOLIN HFA) 108 (90 Base) MCG/ACT inhaler Inhale 2 puffs into the lungs every 6 (six) hours as needed for wheezing or shortness of breath.   Yes [provider]  Baclofen 5 MG TABS Take 1-2 tablets by mouth at bedtime. 02/16/20  Yes [provider]  busPIRone (BUSPAR) 5 MG tablet Take 1 tablet (5 mg total) by mouth every 12 (twelve) hours. 10/19/19  Yes Sharee Holster, NP  Cholecalciferol (D3-1000) 25 MCG (1000 UT) tablet Take 1,000 Units by mouth daily.   Yes [provider]  cloNIDine (CATAPRES) 0.1 MG tablet Take 0.1 mg by mouth daily as needed. 02/10/20  Yes [provider]  cloNIDine (CATAPRES) 0.3 MG tablet Take 1 tablet by mouth 2 (two) times daily.  02/23/20  Yes [provider]  docusate sodium (COLACE) 100 MG capsule Take 100 mg by mouth daily.   Yes [provider]  gabapentin (NEURONTIN) 100 MG capsule Take 100 mg by mouth 3 (three) times daily.   Yes [provider]  cloNIDine (CATAPRES) 0.1 MG tablet Take 1 tablet (0.1 mg total) by mouth 2 (two) times daily. 03/01/20 03/31/20  Darlin Drop, DO  hydrALAZINE (APRESOLINE) 100 MG tablet Take 1 tablet (100 mg total) by mouth 2 (two) times daily. 03/01/20 03/31/20  Dow Adolph N, DO  labetalol (NORMODYNE) 200 MG tablet Take 1 tablet (200 mg total) by mouth every 8 (eight) hours. Patient not taking: Reported on 02/25/2020 10/19/19   Sharee Holster, NP  NON FORMULARY Flush g-tube with 240 ml free water TID. Three Times A Day 09:00 AM, 02:00 PM, 09:00 PM 09/22/19   [provider]    Inpatient Medications: Scheduled Meds: . sodium chloride   Intravenous Once  . sodium chloride    Intravenous Once  . busPIRone  5 mg Oral Q12H  . Chlorhexidine Gluconate Cloth  6 each Topical Daily  . cloNIDine  0.1 mg Oral BID  . ferrous sulfate  325 mg Oral Q breakfast  . hydrALAZINE  100 mg Oral BID  . insulin aspart  0-9 Units Subcutaneous Q4H  . mouth rinse  15 mL Mouth Rinse BID  . sodium chloride flush  3 mL Intravenous Q12H   Continuous Infusions:  PRN Meds: acetaminophen, hydrALAZINE, labetalol, methocarbamol  Allergies:    Allergies  Allergen Reactions  . Losartan Swelling  . Norvasc [Amlodipine] Swelling  . Ace Inhibitors     History of swelling with losartan  . Penicillins     Unable to verify reactions at this time/ Tolerated ceftriaxone March 2021    Social History:   Social History   Socioeconomic History  . Marital status: Single    Spouse name: Not on file  . Number of children: Not on file  . Years of education: Not on file  . Highest education level: Not on file  Occupational History  . Not on file  Tobacco Use  . Smoking status: Never Smoker  . Smokeless tobacco: Never Used  Substance and Sexual Activity  . Alcohol use: Not Currently  . Drug use: Not Currently  . Sexual activity: Not on file  Other Topics Concern  . Not on file  Social History Narrative  . Not on file   Social Determinants of Health   Financial Resource Strain:   . Difficulty of Paying Living Expenses: Not on file  Food Insecurity:   . Worried About Programme researcher, broadcasting/film/video in the Last Year: Not on file  . Ran Out of Food in the Last Year: Not on file  Transportation Needs:   . Lack of Transportation (Medical): Not on file  . Lack of Transportation (Non-Medical): Not on file  Physical Activity:   . Days of Exercise per Week: Not on file  . Minutes of Exercise per Session: Not on file  Stress:   . Feeling of Stress : Not on file  Social Connections:   . Frequency of Communication with Friends and Family: Not on file  . Frequency of Social Gatherings with Friends and  Family: Not on file  . Attends Religious Services: Not on file  . Active Member of Clubs or Organizations: Not on file  . Attends Banker Meetings: Not on file  . Marital Status: Not on file  Intimate Partner Violence:   . Fear of Current or Ex-Partner: Not on file  . Emotionally Abused: Not on file  . Physically Abused: Not on file  . Sexually Abused: Not on file    Family History:    Family History  Problem Relation Age of Onset  . Hypertension Father   . Diabetes Father      ROS:  Please see the history of present illness.   All other ROS reviewed and negative.     Physical Exam/Data:  Vitals:   03/02/20 1143 03/02/20 1145 03/02/20 1200 03/02/20 1215  BP:   (!) 100/53   Pulse:  72 65 (!) 58  Resp:  11 10 10   Temp: 98.5 F (36.9 C)     TempSrc: Oral     SpO2:  94% 94% 94%  Weight:      Height:        Intake/Output Summary (Last 24 hours) at 03/02/2020 1420 Last data filed at 03/02/2020 1130 Gross per 24 hour  Intake 850 ml  Output 1150 ml  Net -300 ml   Last 3 Weights 02/25/2020 02/25/2020 02/01/2020  Weight (lbs) 143 lb 4.8 oz 140 lb (No Data)  Weight (kg) 65 kg 63.504 kg (No Data)     Body mass index is 27.08 kg/m.  General:  Well nourished, well developed, in no acute distress HEENT: normal Lymph: no adenopathy Neck: no JVD Endocrine:  No thryomegaly Vascular: No carotid bruits; FA pulses 2+ bilaterally without bruits  Cardiac:  normal S1, S2; RRR; no murmur  Lungs:  clear to auscultation bilaterally, no wheezing, rhonchi or rales  Abd: soft, nontender, no hepatomegaly  Ext: no edema Musculoskeletal:  No deformities, BUE and BLE strength normal and equal Skin: warm and dry  Neuro:  CNs 2-12 intact, no focal abnormalities noted Psych:  Normal affect   EKG:  The EKG was personally reviewed and demonstrates:  SR LVH type pattern with T wave inversions noted in 1 and aVL. Heart rate 60 Telemetry:  Telemetry was personally reviewed and  demonstrates: Sinus rhythm heart rate between 50 and 80  Relevant CV Studies:  ECHO 03/01/20: 1. Left ventricular ejection fraction, by estimation, is 65 to 70%. The  left ventricle has hyperdynamic function. The left ventricle has no  regional wall motion abnormalities. There is moderate left ventricular  hypertrophy. Left ventricular diastolic  parameters are consistent with Grade I diastolic dysfunction (impaired  relaxation).  2. Right ventricular systolic function is normal. The right ventricular  size is normal. Tricuspid regurgitation signal is inadequate for assessing  PA pressure.  3. Left atrial size was mildly dilated.  4. A small pericardial effusion is present.  5. The mitral valve is normal in structure. No evidence of mitral valve  regurgitation. No evidence of mitral stenosis.  6. The aortic valve is tricuspid. Aortic valve regurgitation is not  visualized. Mild aortic valve sclerosis is present, with no evidence of  aortic valve stenosis.  7. The inferior vena cava is normal in size with greater than 50%  respiratory variability, suggesting right atrial pressure of 3 mmHg.   Laboratory Data:  High Sensitivity Troponin:   Recent Labs  Lab 02/11/20 2231 02/12/20 0031 03/01/20 1233 03/01/20 1506 03/01/20 2037  TROPONINIHS 6 6 12 9 9      Chemistry Recent Labs  Lab 02/28/20 0244 02/29/20 0255 03/01/20 0237  NA 142 140 140  K 3.2* 3.3* 3.8  CL 108 106 104  CO2 27 28 28   GLUCOSE 97 108* 106*  BUN 6 <5* 7  CREATININE 0.59 0.75 0.59  CALCIUM 9.6 9.4 9.8  GFRNONAA >60 >60 >60  ANIONGAP 7 6 8     Recent Labs  Lab 02/25/20 1519  PROT 6.6  ALBUMIN 3.7  AST 15  ALT 14  ALKPHOS 48  BILITOT 0.3   Hematology Recent Labs  Lab 02/29/20 0255 02/29/20 0950 03/01/20 0237 03/01/20 0851 03/01/20 2037 03/02/20 0349 03/02/20 1319  WBC 7.1  --  6.5  --   --   --  6.0  RBC 2.56*  --  2.49*  --   --   --  3.06*  HGB 7.6*  7.6*   < > 7.5*   < >  7.6* 7.1* 9.0*  HCT 23.1*  22.8*   < > 22.5*   < > 23.6* 22.0* 28.1*  MCV 89.1  --  90.4  --   --   --  91.8  MCH 29.7  --  30.1  --   --   --  29.4  MCHC 33.3  --  33.3  --   --   --  32.0  RDW 14.2  --  14.6  --   --   --  15.3  PLT 162  --  175  --   --   --  200   < > = values in this interval not displayed.   BNPNo results for input(s): BNP, PROBNP in the last 168 hours.  DDimer No results for input(s): DDIMER in the last 168 hours.   Radiology/Studies:  ECHOCARDIOGRAM COMPLETE  Result Date: 03/01/2020    ECHOCARDIOGRAM REPORT   Patient Name:   KENESHIA TENA Date of Exam: 03/01/2020 Medical Rec #:  [id number]       Height:       61.0 in Accession #:    [id number]      Weight:       143.3 lb Date of Birth:  Apr 09, 1961       BSA:          1.639 m Patient Age:    59 years        BP:           101/55 mmHg Patient Gender: F               HR:           79 bpm. Exam Location:  Inpatient Procedure: 2D Echo STAT ECHO Indications:    chest pain  History:        Patient has prior history of Echocardiogram examinations, most                 recent 07/19/2019. COPD and Stroke; Risk Factors:Diabetes and                 Hypertension.  Sonographer:    Celene Skeen RDCS (AE) Referring Phys: [id number] Darlin Drop  Sonographer Comments: restricted mobility. left arm limited in mobility & strength. challenging to get an apical window on axis at times due to left arm constraints. IMPRESSIONS  1. Left ventricular ejection fraction, by estimation, is 65 to 70%. The left ventricle has hyperdynamic function. The left ventricle has no regional wall motion abnormalities. There is moderate left ventricular hypertrophy. Left ventricular diastolic parameters are consistent with Grade I diastolic dysfunction (impaired relaxation).  2. Right ventricular systolic function is normal. The right ventricular size is normal. Tricuspid regurgitation signal is inadequate for assessing PA pressure.  3. Left atrial size was mildly  dilated.  4. A small pericardial effusion is present.  5. The mitral valve is normal in structure. No evidence of mitral valve regurgitation. No evidence of mitral stenosis.  6. The aortic valve is tricuspid. Aortic valve regurgitation is not visualized. Mild aortic valve sclerosis is present, with no evidence of aortic valve stenosis.  7. The inferior vena cava is normal in size with greater than 50% respiratory variability, suggesting right atrial pressure of 3 mmHg. FINDINGS  Left Ventricle: Left ventricular ejection fraction,  by estimation, is 65 to 70%. The left ventricle has hyperdynamic function. The left ventricle has no regional wall motion abnormalities. The left ventricular internal cavity size was normal in size. There is moderate left ventricular hypertrophy. Left ventricular diastolic parameters are consistent with Grade I diastolic dysfunction (impaired relaxation). Right Ventricle: The right ventricular size is normal. No increase in right ventricular wall thickness. Right ventricular systolic function is normal. Tricuspid regurgitation signal is inadequate for assessing PA pressure. Left Atrium: Left atrial size was mildly dilated. Right Atrium: Right atrial size was normal in size. Pericardium: A small pericardial effusion is present. Mitral Valve: The mitral valve is normal in structure. Mild mitral annular calcification. No evidence of mitral valve regurgitation. No evidence of mitral valve stenosis. Tricuspid Valve: The tricuspid valve is not well visualized. Tricuspid valve regurgitation is not demonstrated. Aortic Valve: The aortic valve is tricuspid. Aortic valve regurgitation is not visualized. Mild aortic valve sclerosis is present, with no evidence of aortic valve stenosis. Pulmonic Valve: The pulmonic valve was not well visualized. Pulmonic valve regurgitation is not visualized. Aorta: The aortic root is normal in size and structure. Venous: The inferior vena cava is normal in size with  greater than 50% respiratory variability, suggesting right atrial pressure of 3 mmHg. IAS/Shunts: No atrial level shunt detected by color flow Doppler.  LEFT VENTRICLE PLAX 2D LVIDd:         3.60 cm  Diastology LVIDs:         2.10 cm  LV e' medial:    6.31 cm/s LV PW:         1.30 cm  LV E/e' medial:  7.3 LV IVS:        1.40 cm  LV e' lateral:   5.77 cm/s LVOT diam:     2.00 cm  LV E/e' lateral: 8.0 LV SV:         65 LV SV Index:   40 LVOT Area:     3.14 cm  LEFT ATRIUM         Index LA diam:    2.40 cm 1.46 cm/m  AORTIC VALVE LVOT Vmax:   86.80 cm/s LVOT Vmean:  59.600 cm/s LVOT VTI:    0.208 m  AORTA Ao Root diam: 3.40 cm MITRAL VALVE MV Area (PHT): 2.16 cm    SHUNTS MV Decel Time: 352 msec    Systemic VTI:  0.21 m MV E velocity: 46.30 cm/s  Systemic Diam: 2.00 cm MV A velocity: 75.00 cm/s MV E/A ratio:  0.62 Marca Ancona MD Electronically signed by Marca Ancona MD Signature Date/Time: 03/01/2020/5:57:06 PM    Final      Assessment and Plan:   Chest pain -Patient has had recurrent chest discomfort of unclear etiology recurring more frequently at home over the past 2 weeks. She actually had a virtual appointment today with Dr. Gaynelle Arabian but since she was in the hospital we were asked to see her in consultation. Her EKG is unremarkable. Troponin unremarkable. -She has had a recent GI bleed with hemoglobin as low as 7.1, currently 9.0 after 2 units of blood. There is a possibility that her chest discomfort is GI related. However, her last heart rate was 58 bpm and a coronary CT scan would be an excellent way to evaluate her anatomy.  I have talked to Rockwell Alexandria RCT nurse navigator and we will get this done Monday at [facility].  She will arrange for [facility] transportation.  Of course, hemoglobin must remain stable. -  Cardiac risk factors include prior history of stroke diabetes hypertension.  Small pericardial effusion -Unsure if this is contributing to her constellation of symptoms. There is no  evidence of hemodynamic instability.  GI bleed -Colonoscopy on 02/28/2020 revealed colonic mucosa with focal hyperemia.     For questions or updates, please contact [facility] Please consult [email or web address] for contact info under    Signed, Donato Schultz, MD  03/02/2020 2:20 PM

## 2020-03-02 NOTE — Progress Notes (Signed)
Pt pending cardiac CTA on Monday 03/05/20 at 2:15pm which has to be done at [facility] CT1. Coordiated with B. Inez Catalina, RRT.   [facility] round trip transport arrangements made for the 2:15p appt (they typically arrive prior to pick up pt).  Order placed for CCTA as well as 18g IV in an [facility] (Right preferred). Pt to be NPO 4 hr prior to scan (10:30am)  Avoid caffeine and other stimulants for 12 hr prior to scan. Goal HR 55-65 bpm (current HR 58-70bpm)  Order for 0.8mg  nitroglycerin to be given by radiology nurse while patient on CT table.  Any questions reach out to me Rockwell Alexandria) or [facility] CT.  Rockwell Alexandria RN Navigator Cardiac Imaging [facility] [phone] Office  [phone] Cell

## 2020-03-02 NOTE — Progress Notes (Signed)
Discharge delayed as cardiology is planning cardiac CTA on Monday.  Per Dr. Anne Fu, patient will need to be here.  We will continue to monitor and treat as indicated.  Will update her daughter.

## 2020-03-02 NOTE — Discharge Instructions (Signed)
Nonspecific Chest Pain Chest pain can be caused by many different conditions. Some causes of chest pain can be life-threatening. These will require treatment right away. Serious causes of chest pain include:  Heart attack.  A tear in the body's main blood vessel.  Redness and swelling (inflammation) around your heart.  Blood clot in your lungs. Other causes of chest pain may not be so serious. These include:  Heartburn.  Anxiety or stress.  Damage to bones or muscles in your chest.  Lung infections. Chest pain can feel like:  Pain or discomfort in your chest.  Crushing, pressure, aching, or squeezing pain.  Burning or tingling.  Dull or sharp pain that is worse when you move, cough, or take a deep breath.  Pain or discomfort that is also felt in your back, neck, jaw, shoulder, or arm, or pain that spreads to any of these areas. It is hard to know whether your pain is caused by something that is serious or something that is not so serious. So it is important to see your doctor right away if you have chest pain. Follow these instructions at home: Medicines  Take over-the-counter and prescription medicines only as told by your doctor.  If you were prescribed an antibiotic medicine, take it as told by your doctor. Do not stop taking the antibiotic even if you start to feel better. Lifestyle   Rest as told by your doctor.  Do not use any products that contain nicotine or tobacco, such as cigarettes, e-cigarettes, and chewing tobacco. If you need help quitting, ask your doctor.  Do not drink alcohol.  Make lifestyle changes as told by your doctor. These may include: ? Getting regular exercise. Ask your doctor what activities are safe for you. ? Eating a heart-healthy diet. A diet and nutrition specialist (dietitian) can help you to learn healthy eating options. ? Staying at a healthy weight. ? Treating diabetes or high blood pressure, if needed. ? Lowering your stress.  Activities such as yoga and relaxation techniques can help. General instructions  Pay attention to any changes in your symptoms. Tell your doctor about them or any new symptoms.  Avoid any activities that cause chest pain.  Keep all follow-up visits as told by your doctor. This is important. You may need more testing if your chest pain does not go away. Contact a doctor if:  Your chest pain does not go away.  You feel depressed.  You have a fever. Get help right away if:  Your chest pain is worse.  You have a cough that gets worse, or you cough up blood.  You have very bad (severe) pain in your belly (abdomen).  You pass out (faint).  You have either of these for no clear reason: ? Sudden chest discomfort. ? Sudden discomfort in your arms, back, neck, or jaw.  You have shortness of breath at any time.  You suddenly start to sweat, or your skin gets clammy.  You feel sick to your stomach (nauseous).  You throw up (vomit).  You suddenly feel lightheaded or dizzy.  You feel very weak or tired.  Your heart starts to beat fast, or it feels like it is skipping beats. These symptoms may be an emergency. Do not wait to see if the symptoms will go away. Get medical help right away. Call your local emergency services (911 in the [place] Do not drive yourself to the hospital. Summary  Chest pain can be caused by many different conditions. The   cause may be serious and need treatment right away. If you have chest pain, see your doctor right away.  Follow your doctor's instructions for taking medicines and making lifestyle changes.  Keep all follow-up visits as told by your doctor. This includes visits for any further testing if your chest pain does not go away.  Be sure to know the signs that show that your condition has become worse. Get help right away if you have these symptoms. This information is not intended to replace advice given to you by your health care provider. Make  sure you discuss any questions you have with your health care provider. Document Revised: 10/22/2017 Document Reviewed: 10/22/2017 [employer] Patient Education  2020 [employer] Lower Gastrointestinal Bleeding  Lower gastrointestinal (GI) bleeding is the result of bleeding from the colon, rectum, or anal area. The colon is the last part of the digestive tract, where stool, also called feces, is formed. If you have lower GI bleeding, you may see blood in or on your stool. It may be bright red. Lower GI bleeding often stops without treatment. Continued or heavy bleeding needs emergency treatment at the hospital. What are the causes? Lower GI bleeding may be caused by:  A condition that causes pouches to form in the colon over time (diverticulosis).  Swelling and irritation (inflammation) in areas with diverticulosis (diverticulitis).  Inflammation of the colon (inflammatory bowel disease).  Swollen veins in the rectum (hemorrhoids).  Painful tears in the anus (anal fissures), often caused by passing hard stools.  Cancer of the colon or rectum.  Noncancerous growths (polyps) of the colon or rectum.  A bleeding disorder that impairs the formation of blood clots and causes easy bleeding (coagulopathy).  An abnormal weakening of a blood vessel where an artery and a vein come together (arteriovenous malformation). What increases the risk? You are more likely to develop this condition if:  You are older than 58 years of age.  You take aspirin or NSAIDs on a regular basis.  You take anticoagulant or antiplatelet drugs.  You have a history of high-dose X-ray treatment (radiation therapy) of the colon.  You recently had a colon polyp removed. What are the signs or symptoms? Symptoms of this condition include:  Bright red blood or blood clots coming from your rectum.  Bloody stools.  Black or maroon-colored stools.  Pain or cramping in the abdomen.  Weakness or  dizziness.  Racing heartbeat. How is this diagnosed? This condition may be diagnosed based on:  Your symptoms and medical history.  A physical exam. During the exam, your health care provider will check for signs of blood loss, such as low blood pressure and a rapid pulse.  Tests, such as: ? Flexible sigmoidoscopy. In this procedure, a flexible tube with a camera on the end is used to examine your anus and the first part of your colon to look for the source of bleeding. ? Colonoscopy. This is similar to a flexible sigmoidoscopy, but the camera can extend all the way to the uppermost part of your colon. ? Blood tests to measure your red blood cell count and to check for coagulopathy. ? An imaging study of your colon to look for a bleeding site. In some cases, you may have X-rays taken after a dye or radioactive substance is injected into your bloodstream (angiogram). How is this treated? Treatment for this condition depends on the cause of the bleeding. Heavy or persistent bleeding is treated at the hospital. Treatment may include:  Getting fluids through an IV tube inserted into one of your veins.  Getting blood through an IV tube (blood transfusion).  Stopping bleeding through high-heat coagulation, injections of certain medicines, or applying surgical clips. This can all be done during a colonoscopy.  Having a procedure that involves first doing an angiogram and then blocking blood flow to the bleeding site (embolization).  Stopping some of your regular medicines for a certain amount of time.  Having surgery to remove part of the colon. This may be needed if bleeding is severe and does not respond to other treatment. Follow these instructions at home:  Take over-the-counter and prescription medicines only as told by your health care provider. You may need to avoid aspirin, NSAIDs, or other medicines that increase bleeding.  Eat foods that are high in fiber. This will help keep your  stools soft. These foods include whole grains, legumes, fruits, and vegetables. Eating 1-3 prunes each day works well for many people.  Drink enough fluid to keep your urine clear or pale yellow.  Keep all follow-up visits as told by your health care provider. This is important. Contact a health care provider if:  Your symptoms do not improve. Get help right away if:  Your bleeding increases.  You feel light-headed or you faint.  You feel weak.  You have severe cramps in your back or abdomen.  You pass large blood clots in your stool.  Your symptoms get worse. This information is not intended to replace advice given to you by your health care provider. Make sure you discuss any questions you have with your health care provider. Document Revised: 08/13/2018 Document Reviewed: 09/06/2015 Else[employer] Patient Education  2020 [employer]   Rectal Bleeding  Rectal bleeding is when blood comes out of the opening of the butt (anus). People with this kind of bleeding may notice bright red blood in their underwear or in the toilet after they poop (have a bowel movement). They may also have dark red or black poop (stool). Rectal bleeding is often a sign that something is wrong. It needs to be checked by a doctor. Follow these instructions at home: Watch for any changes in your condition. Take these actions to help with bleeding and discomfort:  Eat a diet that is high in fiber. This will keep your poop soft so it is easier for you to poop without pushing too hard. Ask your doctor to tell you what foods and drinks are high in fiber.  Drink enough fluid to keep your pee (urine) clear or pale yellow. This also helps keep your poop soft.  Try taking a warm bath. This may help with pain.  Keep all follow-up visits as told by your doctor. This is important. Get help right away if:  You have new bleeding.  You have more bleeding than before.  You have black or dark red poop.  You throw  up (vomit) blood or something that looks like coffee grounds.  You have pain or tenderness in your belly (abdomen).  You have a fever.  You feel weak.  You feel sick to your stomach (nauseous).  You pass out (faint).  You have very bad pain in your butt.  You cannot poop. This information is not intended to replace advice given to you by your health care provider. Make sure you discuss any questions you have with your health care provider. Document Revised: 04/03/2017 Document Reviewed: 06/17/2015 [employer] Patient Education  2020 [employer].

## 2020-03-02 NOTE — Discharge Summary (Signed)
Discharge Summary  Haley Hernandez ZOX:[id number] DOB: 1960/09/05  PCP: Benita Stabile, MD  Admit date: 02/25/2020 Discharge date: 03/02/2020  Time spent: 35 minutes  Recommendations for Outpatient Follow-up:  1. Follow-up with GI 2. Follow-up with cardiology 3. Follow-up with your primary care provider 4. Take your medications as prescribed 5. Continue PT OT with assistance and fall precautions.  Discharge Diagnoses:  Active Hospital Problems   Diagnosis Date Noted  . Rectal bleeding 02/25/2020  . History of CVA (cerebrovascular accident) 02/25/2020  . Hypertension associated with type 2 diabetes mellitus (HCC) 09/22/2019  . Major depression, recurrent, chronic (HCC) 09/22/2019  . 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    Resolved Hospital Problems  No resolved problems to display.    Discharge Condition: Stable  Diet recommendation: Heart healthy carb modified diet.  Vitals:   03/02/20 1200 03/02/20 1215  BP: (!) 100/53   Pulse: 65 (!) 58  Resp: 10 10  Temp:    SpO2: 94% 94%    History of present illness:  59 year-old female with past medical history significant for COPD, intraventricular hemorrhage with left-sided hemiplegia, obstructive sleep apnea, essential hypertension, chronic anxiety who initially presented to [facility], brought in by her daughter from home with 3 episodes of bright red blood per rectum.  She was transferred to [facility] where she had additionally 5-6 large bloody stools. She is not on any anticoagulation at home.  [facility] was consulted.  Had a colonoscopy on 02/28/20 by Dr. Bosie Clos, which revealed congested, erythematous and ulcerated mucosa in the proximal ascending colon and in the cecum.  Biopsied.  Diverticulosis in the sigmoid colon, in the descending colon and in the ascending colon.  Internal hemorrhoids.  Pathology returned with no evidence of malignancy. FINAL MICROSCOPIC DIAGNOSIS:    A. COLON, CECUM, BIOPSY:  - Colonic mucosa with focal hyperemia.  - No inflammatory changes, adenomatous change or malignancy.   No recurrent GI bleed.  Last bowel movement was on 02/28/20.  Hospital course complicated by atypical chest pain on 03/01/20.  Updated her daughter via phone who stated that the patient was having chest pain more frequently at home, in the past 2 weeks prior to her presentation.  She is scheduled to have her first post hospitalization appointment with cardiology, Dr. Carmon Ginsberg, on 03/02/20 at 2:40PM.  12 lead EKG obtained, no evidence of acute ischemia.  Troponin S pending.  Discharge to home was delayed today due to intermittent atypical chest pain, occurring at rest, left sided, sometimes dull sometimes burning 8/10 and non radiating.  No dyspnea.    Twelve-lead EKG done with no evidence of acute ischemia, troponin is negative x3.  2D echo no wall motion abnormality.  Has a follow-up appointment with cardiology Dr. Carmon Ginsberg on 03/02/2020 at 2:40 PM, will be seen by cardiology in-house.   03/02/20:   Reports burning left chest pain, no skin rashes noted.  On 03/01/2020 reported sore left chest pain.  Does not appear in distress.  Vital signs are stable on the monitor in the room.  Will be evaluated by cardiology.  Plan to discharge to home with home health services if cleared by cardiology.   Hospital Course:  Principal Problem:   Rectal bleeding Active Problems:   Type 2 diabetes mellitus with neurological complications (HCC)   Hypertension associated with type 2 diabetes mellitus (HCC)   Major depression, recurrent, chronic (HCC)   History of CVA (cerebrovascular accident)  Resolved Lower GI bleed  secondary to diverticular bleed Admitted with multiple episodes of hematochezia First colonoscopy done on 02/28/2020 by [facility] with findings consistent with diverticular bleed: Inadequate preparation of the colon, congested erythematous and ulcerated mucosa in  the proximal ascending colon and the cecum which were biopsied.  Diverticulosis in the sigmoid colon, in the descending colon and in the ascending colon.  Internal hemorrhoids. Lower GI bleed has resolved. She received 4 units of blood transfusion during this hospital stay. Hemoglobin 7.1 this AM, transfused 1 U PRBC. Repeat hemoglobin 9.0 on 03/02/2020 post 1 unit PRBC transfusion. Follow-up with GI outpatient.  Atypical chest pain, unclear etiology Per her daughter has been recurring more frequently at home in the last 2 weeks Has an appointment with cardiology Dr. Bufford Buttner outpatient on 03/02/2020 at 2:40 PM-will be seen by cardiology in-house. Twelve-lead EKG no evidence of acute ischemia Troponin S negative x3. 2D echo with the following findings: 1. Left ventricular ejection fraction, by estimation, is 65 to 70%. The  left ventricle has hyperdynamic function. The left ventricle has no  regional wall motion abnormalities. There is moderate left ventricular  hypertrophy. Left ventricular diastolic  parameters are consistent with Grade I diastolic dysfunction (impaired  relaxation).  2. Right ventricular systolic function is normal. The right ventricular  size is normal. Tricuspid regurgitation signal is inadequate for assessing  PA pressure.  3. Left atrial size was mildly dilated.  4. A small pericardial effusion is present.  5. The mitral valve is normal in structure. No evidence of mitral valve  regurgitation. No evidence of mitral stenosis.  6. The aortic valve is tricuspid. Aortic valve regurgitation is not  visualized. Mild aortic valve sclerosis is present, with no evidence of  aortic valve stenosis.  7. The inferior vena cava is normal in size with greater than 50%  respiratory variability, suggesting right atrial pressure of 3 mmHg.   Acute blood loss anemia secondary to lower GI bleed Management as per above.  Essential hypertension Blood pressure  stable. Decreased dose of clonidine from 0.3 mg twice daily to 0.1 mg twice daily. Continue home hydralazine 100 mg twice daily Follow-up with your PCP.  Transient bradycardia Reported 1 episode drop in heart rate to the 30s in the evening of 02/29/2020. Currently back in sinus rhythm Monitored on telemetry. Cardiology consulted.  Resolved hypokalemia/hypomagnesemia Repleted Serum potassium 3.8, serum magnesium 2.3 on 03/01/2020.  Ambulatory dysfunction PT recommended home health PT Continue fall precautions.     Code Status: Full code  Family Communication: Updated her daughter at bedside.  Disposition Plan: To home with home health services   Consultants:  GI  Cardiology  Procedures:  Colonoscopy  Antimicrobials:  None   Discharge Exam: BP (!) 100/53   Pulse (!) 58   Temp 98.5 F (36.9 C) (Oral)   Resp 10   Ht  (1.549 m)   Wt 65 kg   SpO2 94%   BMI 27.08 kg/m  . General: 59 y.o. year-old female well developed well nourished in no acute distress.  Alert and oriented x3. . Cardiovascular: Regular rate and rhythm with no rubs or gallops.  No thyromegaly or JVD noted.   Marland Kitchen Respiratory: Clear to auscultation with no wheezes or rales. Good inspiratory effort. . Abdomen: Soft nontender nondistended with normal bowel sounds x4 quadrants. . Musculoskeletal: No lower extremity edema bilaterally. Marland Kitchen Psychiatry: Mood is appropriate for condition and setting  Discharge Instructions You were cared for by a hospitalist during your hospital stay. If you  have any questions about your discharge medications or the care you received while you were in the hospital after you are discharged, you can call the unit and asked to speak with the hospitalist on call if the hospitalist that took care of you is not available. Once you are discharged, your primary care physician will handle any further medical issues. Please note that NO REFILLS for any discharge  medications will be authorized once you are discharged, as it is imperative that you return to your primary care physician (or establish a relationship with a primary care physician if you do not have one) for your aftercare needs so that they can reassess your need for medications and monitor your lab values.   Allergies as of 03/02/2020      Reactions   Losartan Swelling   Norvasc [amlodipine] Swelling   Ace Inhibitors    History of swelling with losartan   Penicillins    Unable to verify reactions at this time/ Tolerated ceftriaxone March 2021      Medication List    STOP taking these medications   labetalol 200 MG tablet Commonly known as: NORMODYNE     TAKE these medications   albuterol 108 (90 Base) MCG/ACT inhaler Commonly known as: VENTOLIN HFA Inhale 2 puffs into the lungs every 6 (six) hours as needed for wheezing or shortness of breath.   albuterol 1.25 MG/3ML nebulizer solution Commonly known as: ACCUNEB Take 1 ampule by nebulization every 6 (six) hours as needed for wheezing.   Baclofen 5 MG Tabs Take 1-2 tablets by mouth at bedtime.   busPIRone 5 MG tablet Commonly known as: BUSPAR Take 1 tablet (5 mg total) by mouth every 12 (twelve) hours.   cloNIDine 0.1 MG tablet Commonly known as: CATAPRES Take 1 tablet (0.1 mg total) by mouth 2 (two) times daily. What changed:   medication strength  how much to take  Another medication with the same name was removed. Continue taking this medication, and follow the directions you see here.   D3-1000 25 MCG (1000 UT) tablet Generic drug: Cholecalciferol Take 1,000 Units by mouth daily.   docusate sodium 100 MG capsule Commonly known as: COLACE Take 100 mg by mouth daily.   gabapentin 100 MG capsule Commonly known as: NEURONTIN Take 100 mg by mouth 3 (three) times daily.   hydrALAZINE 100 MG tablet Commonly known as: APRESOLINE Take 1 tablet (100 mg total) by mouth 2 (two) times daily. What changed: when  to take this   NON FORMULARY Flush g-tube with 240 ml free water TID. Three Times A Day 09:00 AM, 02:00 PM, 09:00 PM      Allergies  Allergen Reactions  . Losartan Swelling  . Norvasc [Amlodipine] Swelling  . Ace Inhibitors     History of swelling with losartan  . Penicillins     Unable to verify reactions at this time/ Tolerated ceftriaxone March 2021    Follow-up Information    Benita Stabile, MD. Call in 1 day(s).   Specialty: Internal Medicine Why: Please call for a post hospital follow up appointment Contact information: [address] [place] [facility] [address] [phone]        Charlott Rakes, MD. Call in 1 day(s).   Specialty: Gastroenterology Why: Please call for a post hospital follow up appointment Contact information: [address] [place] [address] [phone]        [facility] Follow up.   Why:  they will call for first home appointment. Contact information: [address] [place] [address] [phone]        Jake Bathe, MD. Call in 1 day(s).   Specialty: Cardiology Why: Please call for a post hospital follow-up appointment. Contact information: [address] [place] [address] [phone]                The results of significant diagnostics from this hospitalization (including imaging, microbiology, ancillary and laboratory) are listed below for reference.    Significant Diagnostic Studies: DG Chest Port 1 View  Result Date: 02/11/2020 CLINICAL DATA:  Chest pain EXAM: PORTABLE CHEST 1 VIEW COMPARISON:  08/06/2019 FINDINGS: Tracheostomy and right upper extremity PICC line have been removed. Mild left basilar scarring is unchanged. Lungs are otherwise clear. No pneumothorax or pleural effusion. Cardiac size is mildly enlarged, unchanged. Pulmonary vascularity is normal. No acute bone abnormality. IMPRESSION: No active disease. Electronically Signed    By: Helyn Numbers MD   On: 02/11/2020 23:06   ECHOCARDIOGRAM COMPLETE  Result Date: 03/01/2020    ECHOCARDIOGRAM REPORT   Patient Name:   Haley Hernandez Date of Exam: 03/01/2020 Medical Rec #:  [id number]       Height:       61.0 in Accession #:    [id number]      Weight:       143.3 lb Date of Birth:  1961-04-08       BSA:          1.639 m Patient Age:    59 years        BP:           101/55 mmHg Patient Gender: F               HR:           79 bpm. Exam Location:  Inpatient Procedure: 2D Echo STAT ECHO Indications:    chest pain  History:        Patient has prior history of Echocardiogram examinations, most                 recent 07/19/2019. COPD and Stroke; Risk Factors:Diabetes and                 Hypertension.  Sonographer:    Celene Skeen RDCS (AE) Referring Phys: [id number] Darlin Drop  Sonographer Comments: restricted mobility. left arm limited in mobility & strength. challenging to get an apical window on axis at times due to left arm constraints. IMPRESSIONS  1. Left ventricular ejection fraction, by estimation, is 65 to 70%. The left ventricle has hyperdynamic function. The left ventricle has no regional wall motion abnormalities. There is moderate left ventricular hypertrophy. Left ventricular diastolic parameters are consistent with Grade I diastolic dysfunction (impaired relaxation).  2. Right ventricular systolic function is normal. The right ventricular size is normal. Tricuspid regurgitation signal is inadequate for assessing PA pressure.  3. Left atrial size was mildly dilated.  4. A small pericardial effusion is present.  5. The mitral valve is normal in structure. No evidence of mitral valve regurgitation. No evidence of mitral stenosis.  6. The aortic valve is tricuspid. Aortic valve regurgitation is not visualized. Mild aortic valve sclerosis is present, with no evidence of aortic valve stenosis.  7. The inferior vena cava is normal in size with greater than 50% respiratory variability,  suggesting right atrial pressure of 3 mmHg. FINDINGS  Left Ventricle: Left ventricular  ejection fraction, by estimation, is 65 to 70%. The left ventricle has hyperdynamic function. The left ventricle has no regional wall motion abnormalities. The left ventricular internal cavity size was normal in size. There is moderate left ventricular hypertrophy. Left ventricular diastolic parameters are consistent with Grade I diastolic dysfunction (impaired relaxation). Right Ventricle: The right ventricular size is normal. No increase in right ventricular wall thickness. Right ventricular systolic function is normal. Tricuspid regurgitation signal is inadequate for assessing PA pressure. Left Atrium: Left atrial size was mildly dilated. Right Atrium: Right atrial size was normal in size. Pericardium: A small pericardial effusion is present. Mitral Valve: The mitral valve is normal in structure. Mild mitral annular calcification. No evidence of mitral valve regurgitation. No evidence of mitral valve stenosis. Tricuspid Valve: The tricuspid valve is not well visualized. Tricuspid valve regurgitation is not demonstrated. Aortic Valve: The aortic valve is tricuspid. Aortic valve regurgitation is not visualized. Mild aortic valve sclerosis is present, with no evidence of aortic valve stenosis. Pulmonic Valve: The pulmonic valve was not well visualized. Pulmonic valve regurgitation is not visualized. Aorta: The aortic root is normal in size and structure. Venous: The inferior vena cava is normal in size with greater than 50% respiratory variability, suggesting right atrial pressure of 3 mmHg. IAS/Shunts: No atrial level shunt detected by color flow Doppler.  LEFT VENTRICLE PLAX 2D LVIDd:         3.60 cm  Diastology LVIDs:         2.10 cm  LV e' medial:    6.31 cm/s LV PW:         1.30 cm  LV E/e' medial:  7.3 LV IVS:        1.40 cm  LV e' lateral:   5.77 cm/s LVOT diam:     2.00 cm  LV E/e' lateral: 8.0 LV SV:         65 LV SV  Index:   40 LVOT Area:     3.14 cm  LEFT ATRIUM         Index LA diam:    2.40 cm 1.46 cm/m  AORTIC VALVE LVOT Vmax:   86.80 cm/s LVOT Vmean:  59.600 cm/s LVOT VTI:    0.208 m  AORTA Ao Root diam: 3.40 cm MITRAL VALVE MV Area (PHT): 2.16 cm    SHUNTS MV Decel Time: 352 msec    Systemic VTI:  0.21 m MV E velocity: 46.30 cm/s  Systemic Diam: 2.00 cm MV A velocity: 75.00 cm/s MV E/A ratio:  0.62 Marca Anconaalton Mclean MD Electronically signed by Marca Anconaalton Mclean MD Signature Date/Time: 03/01/2020/5:57:06 PM    Final    CT Angio Abd/Pel w/ and/or w/o  Result Date: 02/26/2020 CLINICAL DATA:  Melena, hypotension EXAM: CTA ABDOMEN AND PELVIS WITHOUT AND WITH CONTRAST TECHNIQUE: Multidetector CT imaging of the abdomen and pelvis was performed using the standard protocol during bolus administration of intravenous contrast. Multiplanar reconstructed images and MIPs were obtained and reviewed to evaluate the vascular anatomy. CONTRAST:  100mL OMNIPAQUE IOHEXOL 350 MG/ML SOLN COMPARISON:  the previous day's study FINDINGS: VASCULAR Aorta: Mild calcified atheromatous plaque in the infrarenal segment. No aneurysm, dissection, or stenosis. Celiac: Patent without evidence of aneurysm, dissection, vasculitis or significant stenosis. SMA: Patent without evidence of aneurysm, dissection, vasculitis or significant stenosis. Renals: Both renal arteries are patent without evidence of aneurysm, dissection, vasculitis, fibromuscular dysplasia or significant stenosis. IMA: Patent without evidence of aneurysm, dissection, vasculitis or significant stenosis. Inflow: Scattered calcified plaque in  common and internal iliac arteries bilaterally. No aneurysm, dissection, or stenosis. Proximal Outflow: Bilateral common femoral and visualized portions of the superficial and profunda femoral arteries are patent without evidence of aneurysm, dissection, vasculitis or significant stenosis. Veins: Patent hepatic veins, portal vein, SMV, splenic vein,  bilateral renal veins. IVC and iliac venous system unremarkable. No venous pathology identified. Review of the MIP images confirms the above findings. NON-VASCULAR Lower chest: Stable small pericardial effusion. Visualized lung bases clear. Blood pool is hypodense compared to the interventricular septum suggesting anemia. Hepatobiliary: No focal liver abnormality is seen. No gallstones, gallbladder wall thickening, or biliary dilatation. Pancreas: Unremarkable. No pancreatic ductal dilatation or surrounding inflammatory changes. Spleen: Normal in size without focal abnormality. Adrenals/Urinary Tract: Adrenal glands unremarkable. Bilateral nephrolithiasis without hydronephrosis. Symmetric renal parenchymal enhancement. 2 cm septated upper pole right renal cyst. Residual contrast material in the renal collecting systems and urinary bladder presumably from previous day's administration. Stomach/Bowel: Stomach is decompressed. The small bowel is nondilated. Normal appendix. The colon is nondilated. Multiple diverticula throughout the colon. No evidence of active extravasation. Lymphatic: No abdominal or pelvic adenopathy. Reproductive: Status post hysterectomy. No adnexal masses. Other: Bilateral pelvic phleboliths.  No ascites.  No free air. Musculoskeletal: No acute or significant osseous findings. IMPRESSION: 1. No active extravasation or acute vascular findings. 2. Colonic diverticulosis. 3. Nephrolithiasis without hydronephrosis. Aortic Atherosclerosis (ICD10-170.0). Electronically Signed   By: Corlis Leak M.D.   On: 02/26/2020 13:40   CT Angio Abd/Pel w/ and/or w/o  Result Date: 02/25/2020 CLINICAL DATA:  Lower GI bleed. EXAM: CTA ABDOMEN AND PELVIS WITHOUT AND WITH CONTRAST TECHNIQUE: Multidetector CT imaging of the abdomen and pelvis was performed using the standard protocol during bolus administration of intravenous contrast. Multiplanar reconstructed images and MIPs were obtained and reviewed to evaluate  the vascular anatomy. CONTRAST:  OMNIPAQUE IOHEXOL 350 MG/ML SOLN COMPARISON:  07/31/2019 FINDINGS: VASCULAR Aorta: Unenhanced images demonstrate calcification of the aorta and iliac arteries. Bilateral intrarenal stones. Arterial phase contrast-enhanced images demonstrate patent abdominal aorta without aneurysm or dissection. No focal stenosis. Celiac: Patent without evidence of aneurysm, dissection, vasculitis or significant stenosis. SMA: Patent without evidence of aneurysm, dissection, vasculitis or significant stenosis. Renals: Single renal arteries bilaterally. No evidence of aneurysm, dissection, or stenosis. Renal nephrograms are symmetrical. IMA: Patent without evidence of aneurysm, dissection, vasculitis or significant stenosis. Inflow: Patent without evidence of aneurysm, dissection, vasculitis or significant stenosis. Proximal Outflow: Bilateral common femoral and visualized portions of the superficial and profunda femoral arteries are patent without evidence of aneurysm, dissection, vasculitis or significant stenosis. Veins: No obvious venous abnormality within the limitations of this arterial phase study. Review of the MIP images confirms the above findings. NON-VASCULAR Lower chest: Cardiac enlargement. Small pericardial effusion. Lung bases are clear. Hepatobiliary: No focal liver abnormality is seen. No gallstones, gallbladder wall thickening, or biliary dilatation. Pancreas: Unremarkable. No pancreatic ductal dilatation or surrounding inflammatory changes. Spleen: Normal in size without focal abnormality. Adrenals/Urinary Tract: No adrenal gland nodules. Cyst in the upper pole right kidney. Bilateral renal stones. Largest is in the lower pole right kidney and measures 9 mm in diameter. No hydronephrosis or hydroureter. Bladder is mostly decompressed with a Foley catheter in place. The catheter sits low in the bladder. Stomach/Bowel: Stomach, small bowel, and colon are not abnormally  distended. No wall thickening or inflammatory changes are appreciated. Scattered stool throughout the colon. Diverticula scattered throughout the colon. No evidence of diverticulitis. No contrast extravasation is demonstrated within the bowel, providing no  indication of the site of hemorrhage. There is a rectal catheter which appears to been expelled. Lymphatic: No significant lymphadenopathy. Reproductive: Status post hysterectomy. No adnexal masses. Other: No free air or free fluid in the abdomen. Abdominal wall musculature appears intact. Musculoskeletal: Degenerative changes in the spine and hips. No destructive bone lesions. IMPRESSION: 1. No evidence of abdominal aortic aneurysm or dissection. No significant arterial stenosis. 2. No contrast extravasation is demonstrated within the bowel, providing no indication of the site of hemorrhage. 3. Cardiac enlargement with small pericardial effusion. 4. Bilateral nonobstructing intrarenal stones. 5. Foley catheter in place. The catheter sits low in the bladder. A rectal catheter appears to have been expelled. 6. Colonic diverticulosis without evidence of diverticulitis. 7. Degenerative changes in the spine and hips. 8. Aortic atherosclerosis. Aortic Atherosclerosis (ICD10-I70.0). Electronically Signed   By: Burman Nieves M.D.   On: 02/25/2020 23:31    Microbiology: Recent Results (from the past 240 hour(s))  Respiratory Panel by RT PCR (Flu A&B, Covid) - Nasopharyngeal Swab     Status: None   Collection Time: 02/25/20  3:19 PM   Specimen: Nasopharyngeal Swab  Result Value Ref Range Status   SARS Coronavirus 2 by RT PCR NEGATIVE NEGATIVE Final    Comment: (NOTE) SARS-CoV-2 target nucleic acids are NOT DETECTED.  The SARS-CoV-2 RNA is generally detectable in upper respiratoy specimens during the acute phase of infection. The lowest concentration of SARS-CoV-2 viral copies this assay can detect is 131 copies/mL. A negative result does not preclude  SARS-Cov-2 infection and should not be used as the sole basis for treatment or other patient management decisions. A negative result may occur with  improper specimen collection/handling, submission of specimen other than nasopharyngeal swab, presence of viral mutation(s) within the areas targeted by this assay, and inadequate number of viral copies (<131 copies/mL). A negative result must be combined with clinical observations, patient history, and epidemiological information. The expected result is Negative.  Fact Sheet for Patients:  https://www.moore.com/  Fact Sheet for Healthcare Providers:  https://www.young.biz/  This test is no t yet approved or cleared by the [place] FDA and  has been authorized for detection and/or diagnosis of SARS-CoV-2 by FDA under an Emergency Use Authorization (EUA). This EUA will remain  in effect (meaning this test can be used) for the duration of the COVID-19 declaration under Section 564(b)(1) of the Act, 21 U.S.C. section 360bbb-3(b)(1), unless the authorization is terminated or revoked sooner.     Influenza A by PCR NEGATIVE NEGATIVE Final   Influenza B by PCR NEGATIVE NEGATIVE Final    Comment: (NOTE) The Xpert Xpress SARS-CoV-2/FLU/RSV assay is intended as an aid in  the diagnosis of influenza from Nasopharyngeal swab specimens and  should not be used as a sole basis for treatment. Nasal washings and  aspirates are unacceptable for Xpert Xpress SARS-CoV-2/FLU/RSV  testing.  Fact Sheet for Patients: https://www.moore.com/  Fact Sheet for Healthcare Providers: https://www.young.biz/  This test is not yet approved or cleared by the [place] FDA and  has been authorized for detection and/or diagnosis of SARS-CoV-2 by  FDA under an Emergency Use Authorization (EUA). This EUA will remain  in effect (meaning this test can be used) for the duration of the   Covid-19 declaration under Section 564(b)(1) of the Act, 21  U.S.C. section 360bbb-3(b)(1), unless the authorization is  terminated or revoked. Performed at [facility], [address] [place], [place] [address]   MRSA PCR Screening  Status: None   Collection Time: 02/25/20  9:25 PM   Specimen: Nasal Mucosa; Nasopharyngeal  Result Value Ref Range Status   MRSA by PCR NEGATIVE NEGATIVE Final    Comment:        The GeneXpert MRSA Assay (FDA approved for NASAL specimens only), is one component of a comprehensive MRSA colonization surveillance program. It is not intended to diagnose MRSA infection nor to guide or monitor treatment for MRSA infections. Performed at [facility], [address] [place], [place] [address]      Labs: Basic Metabolic Panel: Recent Labs  Lab 02/26/20 0705 02/27/20 0331 02/28/20 0244 02/29/20 0255 03/01/20 0237  NA 139 145 142 140 140  K 4.4 4.1 3.2* 3.3* 3.8  CL 108 112* 108 106 104  CO2 24 26 27 28 28   GLUCOSE 127* 103* 97 108* 106*  BUN 12 10 6  <5* 7  CREATININE 0.75 0.72 0.59 0.75 0.59  CALCIUM 9.7 9.7 9.6 9.4 9.8  MG 2.0 1.8 1.8 1.6* 2.3   Liver Function Tests: Recent Labs  Lab 02/25/20 1519  AST 15  ALT 14  ALKPHOS 48  BILITOT 0.3  PROT 6.6  ALBUMIN 3.7   Recent Labs  Lab 02/26/20 0705  LIPASE 31   No results for input(s): AMMONIA in the last 168 hours. CBC: Recent Labs  Lab 02/27/20 1649 02/27/20 1649 02/27/20 2334 02/27/20 2334 02/28/20 0244 02/28/20 03/01/20 02/29/20 0255 02/29/20 0950 03/01/20 0237 03/01/20 0237 03/01/20 0851 03/01/20 1506 03/01/20 2037 03/02/20 0349 03/02/20 1319  WBC 6.4   < > 7.2  --  7.3  --  7.1  --  6.5  --   --   --   --   --  6.0  NEUTROABS 4.2  --  4.4  --  4.5  --  4.7  --  4.3  --   --   --   --   --   --   HGB 8.9*   < > 8.5*   < > 8.4*  8.3*   < > 7.6*  7.6*   < > 7.5*   < > 8.7* 8.2* 7.6* 7.1* 9.0*  HCT 26.7*   < > 25.0*   < > 25.5*   25.3*   < > 23.1*  22.8*   < > 22.5*   < > 26.8* 24.9* 23.6* 22.0* 28.1*  MCV 89.0   < > 87.7  --  88.2  --  89.1  --  90.4  --   --   --   --   --  91.8  PLT 65*   < > 129*  --  153  --  162  --  175  --   --   --   --   --  200   < > = values in this interval not displayed.   Cardiac Enzymes: No results for input(s): CKTOTAL, CKMB, CKMBINDEX, TROPONINI in the last 168 hours. BNP: BNP (last 3 results) No results for input(s): BNP in the last 8760 hours.  ProBNP (last 3 results) No results for input(s): PROBNP in the last 8760 hours.  CBG: Recent Labs  Lab 03/01/20 2005 03/01/20 2348 03/02/20 0259 03/02/20 0731 03/02/20 1122  GLUCAP 97 138* 98 85 137*       Signed:  03/04/20, MD [facility] 03/02/2020, 2:29 PM

## 2020-03-02 NOTE — Progress Notes (Signed)
Please refer to progress note regarding pre procedure requests by Cardiology, written by Rockwell Alexandria on what the patient needs to successfully get the scheduled Cardiac CTA on Monday at 2:15 at [facility].  I spoke with her on the phone today and she is available by phone with questions over the weekend.  If you cannot reach her, she said to call the CT department over at [facility].

## 2020-03-03 LAB — HEMOGLOBIN AND HEMATOCRIT, BLOOD
HCT: 27.9 % — ABNORMAL LOW (ref 36.0–46.0)
HCT: 28.9 % — ABNORMAL LOW (ref 36.0–46.0)
HCT: 28.5 % — ABNORMAL LOW (ref 36.0–46.0)
HCT: 28.2 % — ABNORMAL LOW (ref 36.0–46.0)
Hemoglobin: 9.2 g/dL — ABNORMAL LOW (ref 12.0–15.0)
Hemoglobin: 9.2 g/dL — ABNORMAL LOW (ref 12.0–15.0)
Hemoglobin: 9.3 g/dL — ABNORMAL LOW (ref 12.0–15.0)
Hemoglobin: 9.1 g/dL — ABNORMAL LOW (ref 12.0–15.0)

## 2020-03-03 LAB — BPAM RBC
Blood Product Expiration Date: 202111302359
ISSUE DATE / TIME: 202110290839
Unit Type and Rh: 5100

## 2020-03-03 LAB — GLUCOSE, CAPILLARY
Glucose-Capillary: 103 mg/dL — ABNORMAL HIGH (ref 70–99)
Glucose-Capillary: 108 mg/dL — ABNORMAL HIGH (ref 70–99)
Glucose-Capillary: 117 mg/dL — ABNORMAL HIGH (ref 70–99)
Glucose-Capillary: 118 mg/dL — ABNORMAL HIGH (ref 70–99)
Glucose-Capillary: 118 mg/dL — ABNORMAL HIGH (ref 70–99)
Glucose-Capillary: 99 mg/dL (ref 70–99)

## 2020-03-03 LAB — TYPE AND SCREEN
ABO/RH(D): O POS
Antibody Screen: NEGATIVE
Unit division: 0

## 2020-03-03 NOTE — Progress Notes (Signed)
PROGRESS NOTE  Haley LohDoreen Hernandez WUJ:[id number]:[id number] DOB: May 29, 1960 DOA: 02/25/2020 PCP: Benita StabileHall, John Z, MD  HPI/Recap of past 7524 hours: 59 year-old female with past medical history significant for COPD, intraventricular hemorrhage with left-sided hemiplegia, obstructive sleep apnea, essential hypertension, chronic anxiety who initially [facility], brought in by her daughter from home with 3 episodes of bright red blood per rectum.  [facility] where she had additionally 5-6 large bloody stools.  She is not on any anticoagulation at home.  [facility] was consulted.  Had a colonoscopy on 02/28/20 by Dr. Bosie ClosSchooler, which revealed congested, erythematous and ulcerated mucosa in the proximal ascending colon and in the cecum.  Biopsied.  Diverticulosis in the sigmoid colon, in the descending colon and in the ascending colon.  Internal hemorrhoids.  Pathology returned with no evidence of malignancy. FINAL MICROSCOPIC DIAGNOSIS:   A. COLON, CECUM, BIOPSY:  - Colonic mucosa with focal hyperemia.  - No inflammatory changes, adenomatous change or malignancy.   No recurrent GI bleed.  Last bowel movement was on 02/28/20.  Hospital course complicated by atypical chest pain on 03/01/20. Updated her daughter via phone who stated that the patient was having chest pain more frequently at home, in the past 2 weeksprior to her presentation. She is scheduled to have her first post hospitalization appointment with cardiology, Dr. Carmon Ginsberg'neil, on 03/02/20 at 2:40PM. 12 lead EKG obtained, no evidence of acute ischemia. Troponin S negative x3.  Discharge to home was delayed today due to sudden onset atypical chest pain, at rest.  Cardiology was consulted, ongoing work-up.  [facility].  03/03/20: No acute events overnight.  Denies any chest pain this morning.  Hemoglobin is stable 9.2 from 9.2.  No overt bleeding, no reported recurrent GI  bleed.  Assessment/Plan: Principal Problem:   Rectal bleeding Active Problems:   Type 2 diabetes mellitus with neurological complications (HCC)   Hypertension associated with type 2 diabetes mellitus (HCC)   Major depression, recurrent, chronic (HCC)   History of CVA (cerebrovascular accident)  ResolvedLower GI bleed secondary to diverticular bleed Admitted with multiple episodes of hematochezia First colonoscopy done on 02/28/2020 by [facility] with findings consistent with diverticular bleed: Inadequate preparation of the colon, congested erythematous and ulcerated mucosa in the proximal ascending colon and the cecum which were biopsied. Diverticulosis in the sigmoid colon, in the descending colon and in the ascending colon. Internal hemorrhoids. Lower GI bleedhas resolved. She received 4 units of blood transfusion during this hospital stay. Hemoglobin 7.1 this AM, transfused 1 U PRBC. Repeat hemoglobin 9.0 on 03/02/2020 post 1 unit PRBC transfusion. Follow-up with GI outpatient.  Atypical chest pain, unclear etiology Per her daughter has been recurring more frequently at home in the last 2 weeks Has an appointment with cardiology Dr. Bufford Buttner'Neill outpatient on 03/02/2020 at 2:40PM-will be seen by cardiology in-house. Twelve-lead EKG no evidence of acute ischemia Troponin S negative x3. Denies any chest pain at the time of this visit on 03/03/2020 2D echo with the following findings: 1. Left ventricular ejection fraction, by estimation, is 65 to 70%. The  left ventricle has hyperdynamic function. The left ventricle has no  regional wall motion abnormalities. There is moderate left ventricular  hypertrophy. Left ventricular diastolic  parameters are consistent with Grade I diastolic dysfunction (impaired  relaxation).  2. Right ventricular systolic function is normal. The right ventricular  size is normal. Tricuspid regurgitation signal is inadequate for  assessing  PA pressure.   3. Left atrial size was mildly dilated.  4. A small pericardial effusion is present.  5. The mitral valve is normal in structure. No evidence of mitral valve  regurgitation. No evidence of mitral stenosis.  6. The aortic valve is tricuspid. Aortic valve regurgitation is not  visualized. Mild aortic valve sclerosis is present, with no evidence of  aortic valve stenosis.  7. The inferior vena cava is normal in size with greater than 50%  respiratory variability, suggesting right atrial pressure of 3 mmHg.   Acute blood loss anemia secondary to lower GI bleed Management as per above. Hemoglobin is stable 9.2 from 9.2 Continue to monitor H&H  Essential hypertension Blood pressure stable. Decreased dose of clonidine from 0.3 mg twice daily to 0.1 mg twice daily. Continue home hydralazine100 mg twice daily Follow-up with your PCP.  Transient bradycardia Reported 1 episode drop in heart rate to the 30s in the evening of 02/29/2020. Currently back in sinus rhythm Monitored on telemetry. Cardiology consulted.  Resolved hypokalemia/hypomagnesemia Repleted Serum potassium 3.8, serum magnesium 2.3 on 03/01/2020.  Ambulatory dysfunction PT recommended home health PT Continue fall precautions.   Code Status: Full code  Family Communication: Updated her daughter via phone.  Disposition Plan: To home with home health services   Consultants:  GI  Cardiology  Procedures:  Colonoscopy  Antimicrobials:  None  DVT prophylaxis: SCDs  Status is: Inpatient   Dispo:  Patient From: Home  Planned Disposition: Home with Health Care Svc  Expected discharge date: 03/06/20  Medically stable for discharge: No, ongoing work-up for chest pain.        Objective: Vitals:   03/03/20 1200 03/03/20 1238 03/03/20 1300 03/03/20 1353  BP: 112/63  114/88 125/76  Pulse: 73  70 65  Resp: 10  11 18   Temp:  (!) 97.5 F (36.4 C)  98 F (36.7 C)  TempSrc:  Axillary   Oral  SpO2: 95%  97% 100%  Weight:      Height:        Intake/Output Summary (Last 24 hours) at 03/03/2020 1430 Last data filed at 03/03/2020 1300 Gross per 24 hour  Intake 490 ml  Output 1775 ml  Net -1285 ml   Filed Weights   02/25/20 1407 02/25/20 2125  Weight: 63.5 kg 65 kg    Exam:  . General: 59 y.o. year-old female well-developed well-nourished in no acute distress.  Alert and interactive.   . Cardiovascular: Regular rate and rhythm no rubs or gallops. 41 Respiratory: Clear to auscultation no wheezes or rales.  . Abdomen: Soft Nontender Normal Bowel Sounds Present.  . Musculoskeletal: No lower extremity edema bilaterally.  Marland Kitchen Psychiatry: Mood is appropriate for condition and setting.   Data Reviewed: CBC: Recent Labs  Lab 02/27/20 1649 02/27/20 1649 02/27/20 2334 02/27/20 2334 02/28/20 0244 02/28/20 03/01/20 02/29/20 0255 02/29/20 0950 03/01/20 0237 03/01/20 0851 03/02/20 1319 03/02/20 1543 03/02/20 2045 03/03/20 0301 03/03/20 0949  WBC 6.4   < > 7.2  --  7.3  --  7.1  --  6.5  --  6.0  --   --   --   --   NEUTROABS 4.2  --  4.4  --  4.5  --  4.7  --  4.3  --   --   --   --   --   --   HGB 8.9*   < > 8.5*   < > 8.4*  8.3*   < >  7.6*  7.6*   < > 7.5*   < > 9.0* 9.0* 10.2* 9.2* 9.2*  HCT 26.7*   < > 25.0*   < > 25.5*  25.3*   < > 23.1*  22.8*   < > 22.5*   < > 28.1* 27.7* 30.7* 27.9* 28.9*  MCV 89.0   < > 87.7  --  88.2  --  89.1  --  90.4  --  91.8  --   --   --   --   PLT 65*   < > 129*  --  153  --  162  --  175  --  200  --   --   --   --    < > = values in this interval not displayed.   Basic Metabolic Panel: Recent Labs  Lab 02/26/20 0705 02/27/20 0331 02/28/20 0244 02/29/20 0255 03/01/20 0237  NA 139 145 142 140 140  K 4.4 4.1 3.2* 3.3* 3.8  CL 108 112* 108 106 104  CO2 24 26 27 28 28   GLUCOSE 127* 103* 97 108* 106*  BUN 12 10 6  <5* 7  CREATININE 0.75 0.72 0.59 0.75 0.59  CALCIUM 9.7 9.7 9.6 9.4 9.8  MG 2.0 1.8 1.8 1.6* 2.3    GFR: Estimated Creatinine Clearance: 66.2 mL/min (by C-G formula based on SCr of 0.59 mg/dL). Liver Function Tests: Recent Labs  Lab 02/25/20 1519  AST 15  ALT 14  ALKPHOS 48  BILITOT 0.3  PROT 6.6  ALBUMIN 3.7   Recent Labs  Lab 02/26/20 0705  LIPASE 31   No results for input(s): AMMONIA in the last 168 hours. Coagulation Profile: Recent Labs  Lab 02/25/20 1519  INR 1.0   Cardiac Enzymes: No results for input(s): CKTOTAL, CKMB, CKMBINDEX, TROPONINI in the last 168 hours. BNP (last 3 results) No results for input(s): PROBNP in the last 8760 hours. HbA1C: No results for input(s): HGBA1C in the last 72 hours. CBG: Recent Labs  Lab 03/02/20 1936 03/02/20 2345 03/03/20 0405 03/03/20 0732 03/03/20 1152  GLUCAP 136* 113* 108* 118* 117*   Lipid Profile: No results for input(s): CHOL, HDL, LDLCALC, TRIG, CHOLHDL, LDLDIRECT in the last 72 hours. Thyroid Function Tests: No results for input(s): TSH, T4TOTAL, FREET4, T3FREE, THYROIDAB in the last 72 hours. Anemia Panel: Recent Labs    03/01/20 0851  FERRITIN 23  TIBC 241*  IRON 64   Urine analysis:    Component Value Date/Time   COLORURINE YELLOW 08/06/2019 0817   APPEARANCEUR HAZY (A) 08/06/2019 0817   LABSPEC 1.020 08/06/2019 0817   PHURINE 5.0 08/06/2019 0817   GLUCOSEU NEGATIVE 08/06/2019 0817   HGBUR NEGATIVE 08/06/2019 0817   BILIRUBINUR NEGATIVE 08/06/2019 0817   KETONESUR NEGATIVE 08/06/2019 0817   PROTEINUR NEGATIVE 08/06/2019 0817   NITRITE NEGATIVE 08/06/2019 0817   LEUKOCYTESUR SMALL (A) 08/06/2019 0817   Sepsis Labs: @LABRCNTIP (procalcitonin:4,lacticidven:4)  ) Recent Results (from the past 240 hour(s))  Respiratory Panel by RT PCR (Flu A&B, Covid) - Nasopharyngeal Swab     Status: None   Collection Time: 02/25/20  3:19 PM   Specimen: Nasopharyngeal Swab  Result Value Ref Range Status   SARS Coronavirus 2 by RT PCR NEGATIVE NEGATIVE Final    Comment: (NOTE) SARS-CoV-2 target nucleic  acids are NOT DETECTED.  The SARS-CoV-2 RNA is generally detectable in upper respiratoy specimens during the acute phase of infection. The lowest concentration of SARS-CoV-2 viral copies this assay can detect is 131 copies/mL. A  negative result does not preclude SARS-Cov-2 infection and should not be used as the sole basis for treatment or other patient management decisions. A negative result may occur with  improper specimen collection/handling, submission of specimen other than nasopharyngeal swab, presence of viral mutation(s) within the areas targeted by this assay, and inadequate number of viral copies (<131 copies/mL). A negative result must be combined with clinical observations, patient history, and epidemiological information. The expected result is Negative.  Fact Sheet for Patients:  https://www.moore.com/  Fact Sheet for Healthcare Providers:  https://www.young.biz/  This test is no t yet approved or cleared by the [place] FDA and  has been authorized for detection and/or diagnosis of SARS-CoV-2 by FDA under an Emergency Use Authorization (EUA). This EUA will remain  in effect (meaning this test can be used) for the duration of the COVID-19 declaration under Section 564(b)(1) of the Act, 21 U.S.C. section 360bbb-3(b)(1), unless the authorization is terminated or revoked sooner.     Influenza A by PCR NEGATIVE NEGATIVE Final   Influenza B by PCR NEGATIVE NEGATIVE Final    Comment: (NOTE) The Xpert Xpress SARS-CoV-2/FLU/RSV assay is intended as an aid in  the diagnosis of influenza from Nasopharyngeal swab specimens and  should not be used as a sole basis for treatment. Nasal washings and  aspirates are unacceptable for Xpert Xpress SARS-CoV-2/FLU/RSV  testing.  Fact Sheet for Patients: https://www.moore.com/  Fact Sheet for Healthcare Providers: https://www.young.biz/  This test  is not yet approved or cleared by the [place] FDA and  has been authorized for detection and/or diagnosis of SARS-CoV-2 by  FDA under an Emergency Use Authorization (EUA). This EUA will remain  in effect (meaning this test can be used) for the duration of the  Covid-19 declaration under Section 564(b)(1) of the Act, 21  U.S.C. section 360bbb-3(b)(1), unless the authorization is  terminated or revoked. Performed at [facility], [address] [place], [place] [address]   MRSA PCR Screening     Status: None   Collection Time: 02/25/20  9:25 PM   Specimen: Nasal Mucosa; Nasopharyngeal  Result Value Ref Range Status   MRSA by PCR NEGATIVE NEGATIVE Final    Comment:        The GeneXpert MRSA Assay (FDA approved for NASAL specimens only), is one component of a comprehensive MRSA colonization surveillance program. It is not intended to diagnose MRSA infection nor to guide or monitor treatment for MRSA infections. Performed at [facility], [address], [place], [place] [address]       Studies: No results found.  Scheduled Meds: . sodium chloride   Intravenous Once  . sodium chloride   Intravenous Once  . busPIRone  5 mg Oral Q12H  . Chlorhexidine Gluconate Cloth  6 each Topical Daily  . cholecalciferol  1,000 Units Oral Daily  . cloNIDine  0.1 mg Oral BID  . gabapentin  100 mg Oral TID  . hydrALAZINE  100 mg Oral BID  . insulin aspart  0-9 Units Subcutaneous Q4H  . mouth rinse  15 mL Mouth Rinse BID  . [START ON 03/05/2020] nitroGLYCERIN  0.8 mg Sublingual Once  . sodium chloride flush  3 mL Intravenous Q12H    Continuous Infusions:    LOS: 7 days     Darlin Drop, MD [facility] Pager [phone]  If 7PM-7AM, please contact night-coverage [email or web address] Password [email or web address] 03/03/2020, 2:30 PM

## 2020-03-04 LAB — GLUCOSE, CAPILLARY
Glucose-Capillary: 102 mg/dL — ABNORMAL HIGH (ref 70–99)
Glucose-Capillary: 104 mg/dL — ABNORMAL HIGH (ref 70–99)
Glucose-Capillary: 131 mg/dL — ABNORMAL HIGH (ref 70–99)
Glucose-Capillary: 136 mg/dL — ABNORMAL HIGH (ref 70–99)
Glucose-Capillary: 88 mg/dL (ref 70–99)
Glucose-Capillary: 96 mg/dL (ref 70–99)

## 2020-03-04 MED ORDER — SODIUM CHLORIDE 0.9 % IV SOLN: INTRAVENOUS | Status: DC

## 2020-03-04 NOTE — Progress Notes (Signed)
PROGRESS NOTE  Haley Hernandez ZOX:[id number] DOB: 07-12-1960 DOA: 02/25/2020 PCP: Benita Stabile, MD  HPI/Recap of past 61 hours: 59 year-old female with past medical history significant for COPD, intraventricular hemorrhage with left-sided hemiplegia, obstructive sleep apnea, essential hypertension, chronic anxiety who initially presented to [facility], brought in by her daughter from home with 3 episodes of bright red blood per rectum.  She was transferred to [facility] where she had additionally 5-6 large bloody stools.  She is not on any anticoagulation at home.  [facility] was consulted.  Had a colonoscopy on 02/28/20 by Dr. Bosie Clos, which revealed congested, erythematous and ulcerated mucosa in the proximal ascending colon and in the cecum.  Biopsied.  Diverticulosis in the sigmoid colon, in the descending colon and in the ascending colon.  Internal hemorrhoids.  Pathology returned with no evidence of malignancy. FINAL MICROSCOPIC DIAGNOSIS:   A. COLON, CECUM, BIOPSY:  - Colonic mucosa with focal hyperemia.  - No inflammatory changes, adenomatous change or malignancy.   No recurrent GI bleed.  Last bowel movement was on 02/28/20.  Hospital course complicated by atypical chest pain on 03/01/20. Updated her daughter via phone who stated that the patient was having chest pain more frequently at home, in the past 2 weeksprior to her presentation. She is scheduled to have her first post hospitalization appointment with cardiology, Dr. Carmon Ginsberg, on 03/02/20 at 2:40PM. 12 lead EKG obtained, no evidence of acute ischemia. Troponin S negative x3.  Discharge to home was delayed today due to sudden onset atypical chest pain, at rest.  Cardiology was consulted, ongoing work-up.  Plan for cardiac CTA on Monday at [facility].  03/04/20: No acute events overnight.  Denies any chest pain today.  Cardiac CT at [facility] tomorrow.  Assessment/Plan: Principal Problem:   Rectal  bleeding Active Problems:   Type 2 diabetes mellitus with neurological complications (HCC)   Hypertension associated with type 2 diabetes mellitus (HCC)   Major depression, recurrent, chronic (HCC)   History of CVA (cerebrovascular accident)  ResolvedLower GI bleed secondary to diverticular bleed Admitted with multiple episodes of hematochezia First colonoscopy done on 02/28/2020 by [facility] with findings consistent with diverticular bleed: Inadequate preparation of the colon, congested erythematous and ulcerated mucosa in the proximal ascending colon and the cecum which were biopsied. Diverticulosis in the sigmoid colon, in the descending colon and in the ascending colon. Internal hemorrhoids. Lower GI bleedhas resolved. She received 4 units of blood transfusion during this hospital stay. Hemoglobin 7.1 this AM, transfused 1 U PRBC. Repeat hemoglobin 9.0 on 03/02/2020 post 1 unit PRBC transfusion. Follow-up with GI outpatient.  Atypical chest pain, unclear etiology Per her daughter has been recurring more frequently at home in the last 2 weeks Has an appointment with cardiology Dr. Bufford Buttner outpatient on 03/02/2020 at 2:40PM-will be seen by cardiology in-house. Twelve-lead EKG no evidence of acute ischemia Troponin S negative x3. Denies any chest pain at the time of this visit on 03/03/2020 2D echo with the following findings: 1. Left ventricular ejection fraction, by estimation, is 65 to 70%. The  left ventricle has hyperdynamic function. The left ventricle has no  regional wall motion abnormalities. There is moderate left ventricular  hypertrophy. Left ventricular diastolic  parameters are consistent with Grade I diastolic dysfunction (impaired  relaxation).  2. Right ventricular systolic function is normal. The right ventricular  size is normal. Tricuspid regurgitation signal is inadequate for assessing  PA pressure.  3. Left atrial size was  mildly dilated.  4. A small  pericardial effusion is present.  5. The mitral valve is normal in structure. No evidence of mitral valve  regurgitation. No evidence of mitral stenosis.  6. The aortic valve is tricuspid. Aortic valve regurgitation is not  visualized. Mild aortic valve sclerosis is present, with no evidence of  aortic valve stenosis.  7. The inferior vena cava is normal in size with greater than 50%  respiratory variability, suggesting right atrial pressure of 3 mmHg.   Acute blood loss anemia secondary to lower GI bleed Management as per above. Hemoglobin is stable 9.2 from 9.2 Continue to monitor H&H  Essential hypertension Blood pressure stable. Decreased dose of clonidine from 0.3 mg twice daily to 0.1 mg twice daily. Continue home hydralazine100 mg twice daily Follow-up with your PCP.  Transient bradycardia Reported 1 episode drop in heart rate to the 30s in the evening of 02/29/2020. Currently back in sinus rhythm Monitored on telemetry. Cardiology consulted.  Resolved hypokalemia/hypomagnesemia Repleted Serum potassium 3.8, serum magnesium 2.3 on 03/01/2020.  Ambulatory dysfunction PT recommended home health PT Continue fall precautions.   Code Status: Full code  Family Communication: Updated her daughter via phone.  Disposition Plan: To home with home health services   Consultants:  GI  Cardiology  Procedures:  Colonoscopy  Antimicrobials:  None  DVT prophylaxis: SCDs  Status is: Inpatient   Dispo:  Patient From: Home  Planned Disposition: Home with Health Care Svc  Expected discharge date: 03/06/20  Medically stable for discharge: No, ongoing work-up for chest pain.        Objective: Vitals:   03/03/20 2123 03/04/20 0207 03/04/20 0423 03/04/20 1355  BP: (!) 157/96 109/76 117/70 115/66  Pulse: 63 70 73 70  Resp: 16 14 14 16   Temp: 99 F (37.2 C) 97.9 F (36.6 C) 98.7 F (37.1 C) 98.8 F (37.1 C)  TempSrc: Oral Oral Oral Oral   SpO2: 98% 94% 96% 96%  Weight:      Height:        Intake/Output Summary (Last 24 hours) at 03/04/2020 1744 Last data filed at 03/04/2020 1610 Gross per 24 hour  Intake 671.32 ml  Output 1653 ml  Net -981.68 ml   Filed Weights   02/25/20 1407 02/25/20 2125  Weight: 63.5 kg 65 kg    Exam: No specific changes from previous exam.  . General: 59 y.o. year-old female well-developed well-nourished in no acute distress.  Alert and interactive.   . Cardiovascular: Regular rate and rhythm no rubs or gallops. 41 Respiratory: Clear to auscultation no wheezes or rales.  . Abdomen: Soft Nontender Normal Bowel Sounds Present.  . Musculoskeletal: No lower extremity edema bilaterally.  Marland Kitchen Psychiatry: Mood is appropriate for condition and setting.   Data Reviewed: CBC: Recent Labs  Lab 02/27/20 1649 02/27/20 1649 02/27/20 2334 02/27/20 2334 02/28/20 0244 02/28/20 03/01/20 02/29/20 0255 02/29/20 0950 03/01/20 0237 03/01/20 0851 03/02/20 1319 03/02/20 1543 03/02/20 2045 03/03/20 0301 03/03/20 0949 03/03/20 1517 03/03/20 2043  WBC 6.4   < > 7.2  --  7.3  --  7.1  --  6.5  --  6.0  --   --   --   --   --   --   NEUTROABS 4.2  --  4.4  --  4.5  --  4.7  --  4.3  --   --   --   --   --   --   --   --  HGB 8.9*   < > 8.5*   < > 8.4*  8.3*   < > 7.6*  7.6*   < > 7.5*   < > 9.0*   < > 10.2* 9.2* 9.2* 9.3* 9.1*  HCT 26.7*   < > 25.0*   < > 25.5*  25.3*   < > 23.1*  22.8*   < > 22.5*   < > 28.1*   < > 30.7* 27.9* 28.9* 28.5* 28.2*  MCV 89.0   < > 87.7  --  88.2  --  89.1  --  90.4  --  91.8  --   --   --   --   --   --   PLT 65*   < > 129*  --  153  --  162  --  175  --  200  --   --   --   --   --   --    < > = values in this interval not displayed.   Basic Metabolic Panel: Recent Labs  Lab 02/27/20 0331 02/28/20 0244 02/29/20 0255 03/01/20 0237  NA 145 142 140 140  K 4.1 3.2* 3.3* 3.8  CL 112* 108 106 104  CO2 26 27 28 28   GLUCOSE 103* 97 108* 106*  BUN 10 6 <5* 7   CREATININE 0.72 0.59 0.75 0.59  CALCIUM 9.7 9.6 9.4 9.8  MG 1.8 1.8 1.6* 2.3   GFR: Estimated Creatinine Clearance: 66.2 mL/min (by C-G formula based on SCr of 0.59 mg/dL). Liver Function Tests: No results for input(s): AST, ALT, ALKPHOS, BILITOT, PROT, ALBUMIN in the last 168 hours. No results for input(s): LIPASE, AMYLASE in the last 168 hours. No results for input(s): AMMONIA in the last 168 hours. Coagulation Profile: No results for input(s): INR, PROTIME in the last 168 hours. Cardiac Enzymes: No results for input(s): CKTOTAL, CKMB, CKMBINDEX, TROPONINI in the last 168 hours. BNP (last 3 results) No results for input(s): PROBNP in the last 8760 hours. HbA1C: No results for input(s): HGBA1C in the last 72 hours. CBG: Recent Labs  Lab 03/03/20 2352 03/04/20 0419 03/04/20 0738 03/04/20 1222 03/04/20 1652  GLUCAP 99 88 102* 131* 136*   Lipid Profile: No results for input(s): CHOL, HDL, LDLCALC, TRIG, CHOLHDL, LDLDIRECT in the last 72 hours. Thyroid Function Tests: No results for input(s): TSH, T4TOTAL, FREET4, T3FREE, THYROIDAB in the last 72 hours. Anemia Panel: No results for input(s): VITAMINB12, FOLATE, FERRITIN, TIBC, IRON, RETICCTPCT in the last 72 hours. Urine analysis:    Component Value Date/Time   COLORURINE YELLOW 08/06/2019 0817   APPEARANCEUR HAZY (A) 08/06/2019 0817   LABSPEC 1.020 08/06/2019 0817   PHURINE 5.0 08/06/2019 0817   GLUCOSEU NEGATIVE 08/06/2019 0817   HGBUR NEGATIVE 08/06/2019 0817   BILIRUBINUR NEGATIVE 08/06/2019 0817   KETONESUR NEGATIVE 08/06/2019 0817   PROTEINUR NEGATIVE 08/06/2019 0817   NITRITE NEGATIVE 08/06/2019 0817   LEUKOCYTESUR SMALL (A) 08/06/2019 0817   Sepsis Labs: @LABRCNTIP (procalcitonin:4,lacticidven:4)  ) Recent Results (from the past 240 hour(s))  Respiratory Panel by RT PCR (Flu A&B, Covid) - Nasopharyngeal Swab     Status: None   Collection Time: 02/25/20  3:19 PM   Specimen: Nasopharyngeal Swab  Result  Value Ref Range Status   SARS Coronavirus 2 by RT PCR NEGATIVE NEGATIVE Final    Comment: (NOTE) SARS-CoV-2 target nucleic acids are NOT DETECTED.  The SARS-CoV-2 RNA is generally detectable in upper respiratoy specimens during the acute phase of infection.  The lowest concentration of SARS-CoV-2 viral copies this assay can detect is 131 copies/mL. A negative result does not preclude SARS-Cov-2 infection and should not be used as the sole basis for treatment or other patient management decisions. A negative result may occur with  improper specimen collection/handling, submission of specimen other than nasopharyngeal swab, presence of viral mutation(s) within the areas targeted by this assay, and inadequate number of viral copies (<131 copies/mL). A negative result must be combined with clinical observations, patient history, and epidemiological information. The expected result is Negative.  Fact Sheet for Patients:  https://www.moore.com/https://www.fda.gov/media/142436/download  Fact Sheet for Healthcare Providers:  https://www.young.biz/https://www.fda.gov/media/142435/download  This test is no t yet approved or cleared by the [place] FDA and  has been authorized for detection and/or diagnosis of SARS-CoV-2 by FDA under an Emergency Use Authorization (EUA). This EUA will remain  in effect (meaning this test can be used) for the duration of the COVID-19 declaration under Section 564(b)(1) of the Act, 21 U.S.C. section 360bbb-3(b)(1), unless the authorization is terminated or revoked sooner.     Influenza A by PCR NEGATIVE NEGATIVE Final   Influenza B by PCR NEGATIVE NEGATIVE Final    Comment: (NOTE) The Xpert Xpress SARS-CoV-2/FLU/RSV assay is intended as an aid in  the diagnosis of influenza from Nasopharyngeal swab specimens and  should not be used as a sole basis for treatment. Nasal washings and  aspirates are unacceptable for Xpert Xpress SARS-CoV-2/FLU/RSV  testing.  Fact Sheet for  Patients: https://www.moore.com/https://www.fda.gov/media/142436/download  Fact Sheet for Healthcare Providers: https://www.young.biz/https://www.fda.gov/media/142435/download  This test is not yet approved or cleared by the [place] FDA and  has been authorized for detection and/or diagnosis of SARS-CoV-2 by  FDA under an Emergency Use Authorization (EUA). This EUA will remain  in effect (meaning this test can be used) for the duration of the  Covid-19 declaration under Section 564(b)(1) of the Act, 21  U.S.C. section 360bbb-3(b)(1), unless the authorization is  terminated or revoked. Performed at [facility] [place], [address] [place], [place] [address]   MRSA PCR Screening     Status: None   Collection Time: 02/25/20  9:25 PM   Specimen: Nasal Mucosa; Nasopharyngeal  Result Value Ref Range Status   MRSA by PCR NEGATIVE NEGATIVE Final    Comment:        The GeneXpert MRSA Assay (FDA approved for NASAL specimens only), is one component of a comprehensive MRSA colonization surveillance program. It is not intended to diagnose MRSA infection nor to guide or monitor treatment for MRSA infections. Performed at [facility], [address] [place], [place] [address]       Studies: No results found.  Scheduled Meds: . sodium chloride   Intravenous Once  . sodium chloride   Intravenous Once  . busPIRone  5 mg Oral Q12H  . Chlorhexidine Gluconate Cloth  6 each Topical Daily  . cholecalciferol  1,000 Units Oral Daily  . cloNIDine  0.1 mg Oral BID  . gabapentin  100 mg Oral TID  . hydrALAZINE  100 mg Oral BID  . insulin aspart  0-9 Units Subcutaneous Q4H  . mouth rinse  15 mL Mouth Rinse BID  . [START ON 03/05/2020] nitroGLYCERIN  0.8 mg Sublingual Once  . sodium chloride flush  3 mL Intravenous Q12H    Continuous Infusions: . sodium chloride 50 mL/hr at 03/04/20 0644     LOS: 8 days     Darlin Droparole N Aisia Correira, MD [facility]  Pager [phone]  If 7PM-7AM, please contact  night-coverage [email or web address] Password [facility] 03/04/2020, 5:44 PM

## 2020-03-05 ENCOUNTER — Ambulatory Visit (HOSPITAL_COMMUNITY)
Admit: 2020-03-05 | Discharge: 2020-03-05 | Disposition: A | Discharge status: Home / Self Care | Payer: 59 | Attending: Cardiology | Admitting: Cardiology

## 2020-03-05 DIAGNOSIS — I152 Hypertension secondary to endocrine disorders: Secondary | ICD-10-CM

## 2020-03-05 DIAGNOSIS — E1159 Type 2 diabetes mellitus with other circulatory complications: Secondary | ICD-10-CM

## 2020-03-05 LAB — GLUCOSE, CAPILLARY
Glucose-Capillary: 96 mg/dL (ref 70–99)
Glucose-Capillary: 96 mg/dL (ref 70–99)
Glucose-Capillary: 75 mg/dL (ref 70–99)
Glucose-Capillary: 133 mg/dL — ABNORMAL HIGH (ref 70–99)

## 2020-03-05 LAB — CBC
HCT: 29.9 % — ABNORMAL LOW (ref 36.0–46.0)
Hemoglobin: 9.1 g/dL — ABNORMAL LOW (ref 12.0–15.0)
MCH: 29.3 pg (ref 26.0–34.0)
MCHC: 30.4 g/dL (ref 30.0–36.0)
MCV: 96.1 fL (ref 80.0–100.0)
Platelets: 222 10*3/uL (ref 150–400)
RBC: 3.11 MIL/uL — ABNORMAL LOW (ref 3.87–5.11)
RDW: 15.4 % (ref 11.5–15.5)
WBC: 6.5 10*3/uL (ref 4.0–10.5)
nRBC: 0 % (ref 0.0–0.2)

## 2020-03-05 MED ORDER — IOHEXOL 350 MG/ML SOLN
80.0000 mL | Freq: Once | INTRAVENOUS | Status: AC | PRN
  Administered 2020-03-05: 80 mL via INTRAVENOUS

## 2020-03-05 NOTE — Progress Notes (Signed)
RN reviewed d/c paperwork with pt and daughter, Jon Gills. All questions answered. IV's removed x2. RN assisted pt to get dressed. Tele removed. Pt left in stable condition via wheelchair with daughter.

## 2020-03-05 NOTE — Progress Notes (Signed)
Patient NPO since 0000 03/05/2020

## 2020-03-05 NOTE — Discharge Summary (Addendum)
Discharge Summary  Haley Hernandez GNF:[id number] DOB: 09-03-1960  PCP: Benita Stabile, MD  Admit date: 02/25/2020 Discharge date: 03/05/2020  Time spent: 35 minutes   Recommendations for Outpatient Follow-up:  1. Follow-up with GI 2. Follow-up with cardiology 3. Follow-up with your primary care provider 4. Take your medications as prescribed 5. Continue PT OT with assistance and fall precautions.  Discharge Diagnoses:  Active Hospital Problems   Diagnosis Date Noted  . Rectal bleeding 02/25/2020  . History of CVA (cerebrovascular accident) 02/25/2020  . Hypertension associated with type 2 diabetes mellitus (HCC) 09/22/2019  . Major depression, recurrent, chronic (HCC) 09/22/2019  . 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    Resolved Hospital Problems  No resolved problems to display.    Discharge Condition: Stable  Diet recommendation: Heart healthy carb modified diet   Vitals:   03/05/20 0842 03/05/20 1235  BP: (!) 136/94 132/83  Pulse:  68  Resp:  18  Temp:  98.4 F (36.9 C)  SpO2:  96%    History of present illness:  59 year-old female with past medical history significant for COPD, intraventricular hemorrhage with left-sided hemiplegia, obstructive sleep apnea, essential hypertension, chronic anxiety who initially presented to [facility], brought in by her daughter from home with 3 episodes of bright red blood per rectum.  She was transferred to [facility] where she had 5-6 additional large bloody stools. She is not on oral anticoagulation at home.  She was seen by [facility].  Had a colonoscopy on 02/28/20 by Dr. Bosie Clos, which revealed congested, erythematous and ulcerated mucosa in the proximal ascending colon and in the cecum.  Biopsied, no evidence of malignancy.  Diverticulosis in the sigmoid colon, descending colon, and in the ascending colon.  Internal hemorrhoids.  Pathology returned with no evidence of  malignancy. FINAL MICROSCOPIC DIAGNOSIS:   A. COLON, CECUM, BIOPSY:  - Colonic mucosa with focal hyperemia.  - No inflammatory changes, adenomatous change or malignancy.   She has had no recurrent GI bleed despite having bowel movements.  Hospital course complicated by sudden onset atypical chest pain on 03/01/20. Per her daughter she was having chest pain more frequently at home, in the past 2 weeksprior to her presentation. She was scheduled to have her first post hospitalization appointment with cardiology, Dr. Carmon Ginsberg, on 03/02/20 at2:40PM but missed it.  Due to her chest pain in the hospital a work-up was completed. 12 lead EKG-no evidence of acute ischemia. Troponin S negative x3.  But she continued to have chest pain intermittently.  Cardiology recommended cardiac CTA on 03/05/2020 at [facility].  03/05/20: Cardiac CTA completed at [facility].  Okay to discharge from a cardiac standpoint as the study was essentially normal per cardiology.   Hospital Course:  Principal Problem:   Rectal bleeding Active Problems:   Type 2 diabetes mellitus with neurological complications (HCC)   Hypertension associated with type 2 diabetes mellitus (HCC)   Major depression, recurrent, chronic (HCC)   History of CVA (cerebrovascular accident)  ResolvedLower GI bleed secondary to diverticular bleed Admitted with multiple episodes of hematochezia First colonoscopy done on 02/28/2020 by [facility] with findings consistent with diverticular bleed: Inadequate preparation of the colon, congested erythematous and ulcerated mucosa in the proximal ascending colon and the cecum which were biopsied. Diverticulosis in the sigmoid colon, in the descending colon and in the ascending colon. Internal hemorrhoids. Lower GI bleedhas resolved, no recurrence despite having bowel movements. She received4units of blood  transfusion during this hospital stay. Hemoglobin 9.1 on 03/05/2020 No overt  bleeding. Will need to follow-up with GI outpatient.  Atypical chest pain, unclear etiology Per her daughter has been recurring more frequently at home in the last 2 weeks Missed her appointment with cardiologyDr. Bufford Buttner outpatient on10/29/2021 at2:40PM due to in patient work-up for recurrent chest pain-Seen by cardiology in-house. Twelve-lead EKG no evidence of acute ischemia TroponinSnegative x3. 2D echo with the following findings: 1. Left ventricular ejection fraction, by estimation, is 65 to 70%. The  left ventricle has hyperdynamic function. The left ventricle has no  regional wall motion abnormalities. There is moderate left ventricular  hypertrophy. Left ventricular diastolic  parameters are consistent with Grade I diastolic dysfunction (impaired  relaxation).  2. Right ventricular systolic function is normal. The right ventricular  size is normal. Tricuspid regurgitation signal is inadequate for assessing  PA pressure.  3. Left atrial size was mildly dilated.  4. A small pericardial effusion is present.  5. The mitral valve is normal in structure. No evidence of mitral valve  regurgitation. No evidence of mitral stenosis.  6. The aortic valve is tricuspid. Aortic valve regurgitation is not  visualized. Mild aortic valve sclerosis is present, with no evidence of  aortic valve stenosis.  7. The inferior vena cava is normal in size with greater than 50%  respiratory variability, suggesting right atrial pressure of 3 mmHg.  Cardiac CTA planned on 03/05/2020  Resolved acute blood loss anemia secondary to lower GI bleed, likely diverticular bleed Management as per above.  Essential hypertension Blood pressure stable. Continue clonidine 0.1 mg twice daily, hydralazine 100 mg twice daily. Follow-up with your PCP.  Resolved transient bradycardia Reported1 episodedrop in heart rate to the 30sin the evening of 02/29/2020. Currently back in sinus  rhythm Monitored on telemetry.  Resolved hypokalemia/hypomagnesemia Repleted Serum potassium 3.8, serum magnesium 2.3on 03/01/2020.  Ambulatory dysfunction PT recommended home health PT Continue fall precautions. TOC assisting with home health services.   Code Status: Full code  Disposition Plan: To home with home health services   Consultants:  GI  Cardiology  Procedures:  Colonoscopy 02/28/20  Cardiac CTA 03/05/20  Antimicrobials:  None   Discharge Exam: BP 132/83 (BP Location: Left Arm)   Pulse 68   Temp 98.4 F (36.9 C) (Oral)   Resp 18   Ht 5\' 1"  (1.549 m)   Wt 65 kg   SpO2 96%   BMI 27.08 kg/m  . General: 59 y.o. year-old female well developed well nourished in no acute distress.  Alert and interactive. . Cardiovascular: Regular rate and rhythm with no rubs or gallops.  No thyromegaly or JVD noted.   Marland Kitchen Respiratory: Clear to auscultation with no wheezes or rales. Good inspiratory effort. . Abdomen: Soft nontender nondistended with normal bowel sounds x4 quadrants. . Musculoskeletal: No lower extremity edema bilaterally. Marland Kitchen Psychiatry: Mood is appropriate for condition and setting  Discharge Instructions You were cared for by a hospitalist during your hospital stay. If you have any questions about your discharge medications or the care you received while you were in the hospital after you are discharged, you can call the unit and asked to speak with the hospitalist on call if the hospitalist that took care of you is not available. Once you are discharged, your primary care physician will handle any further medical issues. Please note that NO REFILLS for any discharge medications will be authorized once you are discharged, as it is imperative that you return to  your primary care physician (or establish a relationship with a primary care physician if you do not have one) for your aftercare needs so that they can reassess your need for medications and  monitor your lab values.   Allergies as of 03/05/2020      Reactions   Losartan Swelling   Norvasc [amlodipine] Swelling   Ace Inhibitors    History of swelling with losartan   Penicillins    Unable to verify reactions at this time/ Tolerated ceftriaxone March 2021      Medication List    STOP taking these medications   labetalol 200 MG tablet Commonly known as: NORMODYNE     TAKE these medications   albuterol 108 (90 Base) MCG/ACT inhaler Commonly known as: VENTOLIN HFA Inhale 2 puffs into the lungs every 6 (six) hours as needed for wheezing or shortness of breath.   albuterol 1.25 MG/3ML nebulizer solution Commonly known as: ACCUNEB Take 1 ampule by nebulization every 6 (six) hours as needed for wheezing.   Baclofen 5 MG Tabs Take 1-2 tablets by mouth at bedtime.   busPIRone 5 MG tablet Commonly known as: BUSPAR Take 1 tablet (5 mg total) by mouth every 12 (twelve) hours.   cloNIDine 0.1 MG tablet Commonly known as: CATAPRES Take 1 tablet (0.1 mg total) by mouth 2 (two) times daily. What changed:   medication strength  how much to take  Another medication with the same name was removed. Continue taking this medication, and follow the directions you see here. Notes to patient: Next dose due tonight at bedtime.   D3-1000 25 MCG (1000 UT) tablet Generic drug: Cholecalciferol Take 1,000 Units by mouth daily.   docusate sodium 100 MG capsule Commonly known as: COLACE Take 100 mg by mouth daily.   gabapentin 100 MG capsule Commonly known as: NEURONTIN Take 100 mg by mouth 3 (three) times daily.   hydrALAZINE 100 MG tablet Commonly known as: APRESOLINE Take 1 tablet (100 mg total) by mouth 2 (two) times daily. What changed: when to take this Notes to patient: Next dose due at bedtime.   NON FORMULARY Flush g-tube with 240 ml free water TID. Three Times A Day 09:00 AM, 02:00 PM, 09:00 PM      Allergies  Allergen Reactions  . Losartan Swelling  .  Norvasc [Amlodipine] Swelling  . Ace Inhibitors     History of swelling with losartan  . Penicillins     Unable to verify reactions at this time/ Tolerated ceftriaxone March 2021    Follow-up Information    Benita StabileHall, John Z, MD. Call in 1 day(s).   Specialty: Internal Medicine Why: Please call for a post hospital follow up appointment Contact information: [address] [place] [address] [place] [facility] [id number] [phone]        Charlott RakesSchooler, Vincent, MD. Call in 1 day(s).   Specialty: Gastroenterology Why: Please call for a post hospital follow up appointment Contact information: [address] [place] [address] [phone]        Sherwood GamblerLlc, [facility] Follow up.   Why: they will call for first home appointment. Contact information: [address] [place] [address] [phone]        Jake BatheSkains, Mark C, MD. Call in 1 day(s).   Specialty: Cardiology Why: Please call for a post hospital follow-up appointment. Contact information: [address] [place] [address] [phone]  The results of significant diagnostics from this hospitalization (including imaging, microbiology, ancillary and laboratory) are listed below for reference.    Significant Diagnostic Studies: CT CORONARY MORPH W/CTA COR W/SCORE W/CA W/CM &/OR WO/CM  Addendum Date: 03/05/2020   ADDENDUM REPORT: 03/05/2020 15:34 CLINICAL DATA:  59 year old female with chest pain EXAM: Cardiac/Coronary  CTA TECHNIQUE: The patient was scanned on a [employer]. FINDINGS: A 110 kV prospective scan was triggered in the descending thoracic aorta at 111 HU's. Axial non-contrast 3 mm slices were carried out through the heart. The data set was analyzed on a dedicated work station and scored using the Agatson method. Gantry rotation speed was 250 msecs and collimation was .6 mm. Beta blockade and 0.8 mg of sl NTG was given. The 3D data set was  reconstructed in 5% intervals of the 67-82 % of the R-R cycle. Diastolic phases were analyzed on a dedicated work station using MPR, MIP and VRT modes. The patient received 80 cc of contrast. Aorta: Upper normal size, ascending aorta 38 mm. Mild descending aortic calcifications. No dissection. Aortic Valve:  Trileaflet.  No calcifications. Coronary Arteries:  Normal coronary origin.  Left dominance. RCA is a small non dominant artery. There is no obvious plaque. Left main is a large artery that gives rise to LAD and LCX arteries. LAD is a large vessel that has a small non flow limiting focal ostial plaque, 0-24%. There is a portion of mid to distal vessel that is non interpretable due to step artifact, however in 60% phase appears fairly normal. LCX is a dominant artery that gives rise to one large OM1 branch and PDA. There is no obvious plaque in portion of vessel that is interpretable (stair step artifact). Other findings: Normal pulmonary vein drainage into the left atrium. Normal left atrial appendage without a thrombus. Normal size of the pulmonary artery. Small pericardial effusion is present. Please see radiology report for non cardiac findings. IMPRESSION: 1. Coronary calcium score of 2. This was 87 percentile for age and sex matched control. 2. Normal coronary origin with LEFT dominance. 3. No evidence of significant flow limiting CAD in interpretable vessel segments (stair step artifact, likely breathing artifact noted). There is a small focal calcified plaque in ostial LAD of 0-24% stenosis. 4.  Small pericardial effusion is present 5. Mild descending aortic atherosclerosis. Mildly dilated ascending aorta, 38 mm. Donato Schultz, MD [facility] Electronically Signed   By: Donato Schultz MD   On: 03/05/2020 15:34   Result Date: 03/05/2020 EXAM: OVER-READ INTERPRETATION  CT CHEST The following report is an over-read performed by radiologist Dr. Genevive Bi of [facility] on 03/05/2020. This over-read  does not include interpretation of cardiac or coronary anatomy or pathology. The coronary CTA interpretation by the cardiologist is attached. COMPARISON:  None. FINDINGS: Limited view of the lung parenchyma demonstrates no suspicious nodularity. Airways are normal. Limited view of the mediastinum demonstrates no adenopathy. Esophagus normal. Limited view of the upper abdomen unremarkable. Limited view of the skeleton and chest wall is unremarkable. IMPRESSION: No significant extracardiac findings. Electronically Signed: By: Genevive Bi M.D. On: 03/05/2020 14:35   DG Chest Port 1 View  Result Date: 02/11/2020 CLINICAL DATA:  Chest pain EXAM: PORTABLE CHEST 1 VIEW COMPARISON:  08/06/2019 FINDINGS: Tracheostomy and right upper extremity PICC line have been removed. Mild left basilar scarring is unchanged. Lungs are otherwise clear. No pneumothorax or pleural effusion. Cardiac size is mildly enlarged, unchanged. Pulmonary vascularity is normal. No acute  bone abnormality. IMPRESSION: No active disease. Electronically Signed   By: Helyn Numbers MD   On: 02/11/2020 23:06   ECHOCARDIOGRAM COMPLETE  Result Date: 03/01/2020    ECHOCARDIOGRAM REPORT   Patient Name:   Haley Hernandez Date of Exam: 03/01/2020 Medical Rec #:  [id number]       Height:       61.0 in Accession #:    [id number]      Weight:       143.3 lb Date of Birth:  1961/04/22       BSA:          1.639 m Patient Age:    59 years        BP:           101/55 mmHg Patient Gender: F               HR:           79 bpm. Exam Location:  Inpatient Procedure: 2D Echo STAT ECHO Indications:    chest pain  History:        Patient has prior history of Echocardiogram examinations, most                 recent 07/19/2019. COPD and Stroke; Risk Factors:Diabetes and                 Hypertension.  Sonographer:    Celene Skeen RDCS (AE) Referring Phys: [id number] Darlin Drop  Sonographer Comments: restricted mobility. left arm limited in mobility & strength.  challenging to get an apical window on axis at times due to left arm constraints. IMPRESSIONS  1. Left ventricular ejection fraction, by estimation, is 65 to 70%. The left ventricle has hyperdynamic function. The left ventricle has no regional wall motion abnormalities. There is moderate left ventricular hypertrophy. Left ventricular diastolic parameters are consistent with Grade I diastolic dysfunction (impaired relaxation).  2. Right ventricular systolic function is normal. The right ventricular size is normal. Tricuspid regurgitation signal is inadequate for assessing PA pressure.  3. Left atrial size was mildly dilated.  4. A small pericardial effusion is present.  5. The mitral valve is normal in structure. No evidence of mitral valve regurgitation. No evidence of mitral stenosis.  6. The aortic valve is tricuspid. Aortic valve regurgitation is not visualized. Mild aortic valve sclerosis is present, with no evidence of aortic valve stenosis.  7. The inferior vena cava is normal in size with greater than 50% respiratory variability, suggesting right atrial pressure of 3 mmHg. FINDINGS  Left Ventricle: Left ventricular ejection fraction, by estimation, is 65 to 70%. The left ventricle has hyperdynamic function. The left ventricle has no regional wall motion abnormalities. The left ventricular internal cavity size was normal in size. There is moderate left ventricular hypertrophy. Left ventricular diastolic parameters are consistent with Grade I diastolic dysfunction (impaired relaxation). Right Ventricle: The right ventricular size is normal. No increase in right ventricular wall thickness. Right ventricular systolic function is normal. Tricuspid regurgitation signal is inadequate for assessing PA pressure. Left Atrium: Left atrial size was mildly dilated. Right Atrium: Right atrial size was normal in size. Pericardium: A small pericardial effusion is present. Mitral Valve: The mitral valve is normal in  structure. Mild mitral annular calcification. No evidence of mitral valve regurgitation. No evidence of mitral valve stenosis. Tricuspid Valve: The tricuspid valve is not well visualized. Tricuspid valve regurgitation is not demonstrated. Aortic Valve: The aortic valve is tricuspid. Aortic valve regurgitation is  not visualized. Mild aortic valve sclerosis is present, with no evidence of aortic valve stenosis. Pulmonic Valve: The pulmonic valve was not well visualized. Pulmonic valve regurgitation is not visualized. Aorta: The aortic root is normal in size and structure. Venous: The inferior vena cava is normal in size with greater than 50% respiratory variability, suggesting right atrial pressure of 3 mmHg. IAS/Shunts: No atrial level shunt detected by color flow Doppler.  LEFT VENTRICLE PLAX 2D LVIDd:         3.60 cm  Diastology LVIDs:         2.10 cm  LV e' medial:    6.31 cm/s LV PW:         1.30 cm  LV E/e' medial:  7.3 LV IVS:        1.40 cm  LV e' lateral:   5.77 cm/s LVOT diam:     2.00 cm  LV E/e' lateral: 8.0 LV SV:         65 LV SV Index:   40 LVOT Area:     3.14 cm  LEFT ATRIUM         Index LA diam:    2.40 cm 1.46 cm/m  AORTIC VALVE LVOT Vmax:   86.80 cm/s LVOT Vmean:  59.600 cm/s LVOT VTI:    0.208 m  AORTA Ao Root diam: 3.40 cm MITRAL VALVE MV Area (PHT): 2.16 cm    SHUNTS MV Decel Time: 352 msec    Systemic VTI:  0.21 m MV E velocity: 46.30 cm/s  Systemic Diam: 2.00 cm MV A velocity: 75.00 cm/s MV E/A ratio:  0.62 Marca Ancona MD Electronically signed by Marca Ancona MD Signature Date/Time: 03/01/2020/5:57:06 PM    Final    CT Angio Abd/Pel w/ and/or w/o  Result Date: 02/26/2020 CLINICAL DATA:  Melena, hypotension EXAM: CTA ABDOMEN AND PELVIS WITHOUT AND WITH CONTRAST TECHNIQUE: Multidetector CT imaging of the abdomen and pelvis was performed using the standard protocol during bolus administration of intravenous contrast. Multiplanar reconstructed images and MIPs were obtained and reviewed  to evaluate the vascular anatomy. CONTRAST:  OMNIPAQUE IOHEXOL 350 MG/ML SOLN COMPARISON:  the previous day's study FINDINGS: VASCULAR Aorta: Mild calcified atheromatous plaque in the infrarenal segment. No aneurysm, dissection, or stenosis. Celiac: Patent without evidence of aneurysm, dissection, vasculitis or significant stenosis. SMA: Patent without evidence of aneurysm, dissection, vasculitis or significant stenosis. Renals: Both renal arteries are patent without evidence of aneurysm, dissection, vasculitis, fibromuscular dysplasia or significant stenosis. IMA: Patent without evidence of aneurysm, dissection, vasculitis or significant stenosis. Inflow: Scattered calcified plaque in common and internal iliac arteries bilaterally. No aneurysm, dissection, or stenosis. Proximal Outflow: Bilateral common femoral and visualized portions of the superficial and profunda femoral arteries are patent without evidence of aneurysm, dissection, vasculitis or significant stenosis. Veins: Patent hepatic veins, portal vein, SMV, splenic vein, bilateral renal veins. IVC and iliac venous system unremarkable. No venous pathology identified. Review of the MIP images confirms the above findings. NON-VASCULAR Lower chest: Stable small pericardial effusion. Visualized lung bases clear. Blood pool is hypodense compared to the interventricular septum suggesting anemia. Hepatobiliary: No focal liver abnormality is seen. No gallstones, gallbladder wall thickening, or biliary dilatation. Pancreas: Unremarkable. No pancreatic ductal dilatation or surrounding inflammatory changes. Spleen: Normal in size without focal abnormality. Adrenals/Urinary Tract: Adrenal glands unremarkable. Bilateral nephrolithiasis without hydronephrosis. Symmetric renal parenchymal enhancement. 2 cm septated upper pole right renal cyst. Residual contrast material in the renal collecting systems and urinary bladder presumably from previous  day's  administration. Stomach/Bowel: Stomach is decompressed. The small bowel is nondilated. Normal appendix. The colon is nondilated. Multiple diverticula throughout the colon. No evidence of active extravasation. Lymphatic: No abdominal or pelvic adenopathy. Reproductive: Status post hysterectomy. No adnexal masses. Other: Bilateral pelvic phleboliths.  No ascites.  No free air. Musculoskeletal: No acute or significant osseous findings. IMPRESSION: 1. No active extravasation or acute vascular findings. 2. Colonic diverticulosis. 3. Nephrolithiasis without hydronephrosis. Aortic Atherosclerosis (ICD10-170.0). Electronically Signed   By: Corlis Leak M.D.   On: 02/26/2020 13:40   CT Angio Abd/Pel w/ and/or w/o  Result Date: 02/25/2020 CLINICAL DATA:  Lower GI bleed. EXAM: CTA ABDOMEN AND PELVIS WITHOUT AND WITH CONTRAST TECHNIQUE: Multidetector CT imaging of the abdomen and pelvis was performed using the standard protocol during bolus administration of intravenous contrast. Multiplanar reconstructed images and MIPs were obtained and reviewed to evaluate the vascular anatomy. CONTRAST:  OMNIPAQUE IOHEXOL 350 MG/ML SOLN COMPARISON:  07/31/2019 FINDINGS: VASCULAR Aorta: Unenhanced images demonstrate calcification of the aorta and iliac arteries. Bilateral intrarenal stones. Arterial phase contrast-enhanced images demonstrate patent abdominal aorta without aneurysm or dissection. No focal stenosis. Celiac: Patent without evidence of aneurysm, dissection, vasculitis or significant stenosis. SMA: Patent without evidence of aneurysm, dissection, vasculitis or significant stenosis. Renals: Single renal arteries bilaterally. No evidence of aneurysm, dissection, or stenosis. Renal nephrograms are symmetrical. IMA: Patent without evidence of aneurysm, dissection, vasculitis or significant stenosis. Inflow: Patent without evidence of aneurysm, dissection, vasculitis or significant stenosis. Proximal Outflow: Bilateral common  femoral and visualized portions of the superficial and profunda femoral arteries are patent without evidence of aneurysm, dissection, vasculitis or significant stenosis. Veins: No obvious venous abnormality within the limitations of this arterial phase study. Review of the MIP images confirms the above findings. NON-VASCULAR Lower chest: Cardiac enlargement. Small pericardial effusion. Lung bases are clear. Hepatobiliary: No focal liver abnormality is seen. No gallstones, gallbladder wall thickening, or biliary dilatation. Pancreas: Unremarkable. No pancreatic ductal dilatation or surrounding inflammatory changes. Spleen: Normal in size without focal abnormality. Adrenals/Urinary Tract: No adrenal gland nodules. Cyst in the upper pole right kidney. Bilateral renal stones. Largest is in the lower pole right kidney and measures 9 mm in diameter. No hydronephrosis or hydroureter. Bladder is mostly decompressed with a Foley catheter in place. The catheter sits low in the bladder. Stomach/Bowel: Stomach, small bowel, and colon are not abnormally distended. No wall thickening or inflammatory changes are appreciated. Scattered stool throughout the colon. Diverticula scattered throughout the colon. No evidence of diverticulitis. No contrast extravasation is demonstrated within the bowel, providing no indication of the site of hemorrhage. There is a rectal catheter which appears to been expelled. Lymphatic: No significant lymphadenopathy. Reproductive: Status post hysterectomy. No adnexal masses. Other: No free air or free fluid in the abdomen. Abdominal wall musculature appears intact. Musculoskeletal: Degenerative changes in the spine and hips. No destructive bone lesions. IMPRESSION: 1. No evidence of abdominal aortic aneurysm or dissection. No significant arterial stenosis. 2. No contrast extravasation is demonstrated within the bowel, providing no indication of the site of hemorrhage. 3. Cardiac enlargement with small  pericardial effusion. 4. Bilateral nonobstructing intrarenal stones. 5. Foley catheter in place. The catheter sits low in the bladder. A rectal catheter appears to have been expelled. 6. Colonic diverticulosis without evidence of diverticulitis. 7. Degenerative changes in the spine and hips. 8. Aortic atherosclerosis. Aortic Atherosclerosis (ICD10-I70.0). Electronically Signed   By: Burman Nieves M.D.   On: 02/25/2020 23:31    Microbiology: Recent  Results (from the past 240 hour(s))  Respiratory Panel by RT PCR (Flu A&B, Covid) - Nasopharyngeal Swab     Status: None   Collection Time: 02/25/20  3:19 PM   Specimen: Nasopharyngeal Swab  Result Value Ref Range Status   SARS Coronavirus 2 by RT PCR NEGATIVE NEGATIVE Final    Comment: (NOTE) SARS-CoV-2 target nucleic acids are NOT DETECTED.  The SARS-CoV-2 RNA is generally detectable in upper respiratoy specimens during the acute phase of infection. The lowest concentration of SARS-CoV-2 viral copies this assay can detect is 131 copies/mL. A negative result does not preclude SARS-Cov-2 infection and should not be used as the sole basis for treatment or other patient management decisions. A negative result may occur with  improper specimen collection/handling, submission of specimen other than nasopharyngeal swab, presence of viral mutation(s) within the areas targeted by this assay, and inadequate number of viral copies (<131 copies/mL). A negative result must be combined with clinical observations, patient history, and epidemiological information. The expected result is Negative.  Fact Sheet for Patients:  https://www.moore.com/  Fact Sheet for Healthcare Providers:  https://www.young.biz/  This test is no t yet approved or cleared by the [place] FDA and  has been authorized for detection and/or diagnosis of SARS-CoV-2 by FDA under an Emergency Use Authorization (EUA). This EUA will remain   in effect (meaning this test can be used) for the duration of the COVID-19 declaration under Section 564(b)(1) of the Act, 21 U.S.C. section 360bbb-3(b)(1), unless the authorization is terminated or revoked sooner.     Influenza A by PCR NEGATIVE NEGATIVE Final   Influenza B by PCR NEGATIVE NEGATIVE Final    Comment: (NOTE) The Xpert Xpress SARS-CoV-2/FLU/RSV assay is intended as an aid in  the diagnosis of influenza from Nasopharyngeal swab specimens and  should not be used as a sole basis for treatment. Nasal washings and  aspirates are unacceptable for Xpert Xpress SARS-CoV-2/FLU/RSV  testing.  Fact Sheet for Patients: https://www.moore.com/  Fact Sheet for Healthcare Providers: https://www.young.biz/  This test is not yet approved or cleared by the [place] FDA and  has been authorized for detection and/or diagnosis of SARS-CoV-2 by  FDA under an Emergency Use Authorization (EUA). This EUA will remain  in effect (meaning this test can be used) for the duration of the  Covid-19 declaration under Section 564(b)(1) of the Act, 21  U.S.C. section 360bbb-3(b)(1), unless the authorization is  terminated or revoked. Performed at [facility], [address] [place], [place] [address]   MRSA PCR Screening     Status: None   Collection Time: 02/25/20  9:25 PM   Specimen: Nasal Mucosa; Nasopharyngeal  Result Value Ref Range Status   MRSA by PCR NEGATIVE NEGATIVE Final    Comment:        The GeneXpert MRSA Assay (FDA approved for NASAL specimens only), is one component of a comprehensive MRSA colonization surveillance program. It is not intended to diagnose MRSA infection nor to guide or monitor treatment for MRSA infections. Performed at [facility], [address] [place], [place] [address]      Labs: Basic Metabolic Panel: Recent Labs  Lab 02/28/20 0244 02/29/20 0255 03/01/20 0237  NA  142 140 140  K 3.2* 3.3* 3.8  CL 108 106 104  CO2 27 28 28   GLUCOSE 97 108* 106*  BUN 6 <5* 7  CREATININE 0.59 0.75 0.59  CALCIUM 9.6 9.4 9.8  MG 1.8 1.6* 2.3  Liver Function Tests: No results for input(s): AST, ALT, ALKPHOS, BILITOT, PROT, ALBUMIN in the last 168 hours. No results for input(s): LIPASE, AMYLASE in the last 168 hours. No results for input(s): AMMONIA in the last 168 hours. CBC: Recent Labs  Lab 02/27/20 2334 02/27/20 2334 02/28/20 0244 02/28/20 4098 02/29/20 0255 02/29/20 0950 03/01/20 0237 03/01/20 0851 03/02/20 1319 03/02/20 1543 03/03/20 0301 03/03/20 0949 03/03/20 1517 03/03/20 2043 03/05/20 0351  WBC 7.2   < > 7.3  --  7.1  --  6.5  --  6.0  --   --   --   --   --  6.5  NEUTROABS 4.4  --  4.5  --  4.7  --  4.3  --   --   --   --   --   --   --   --   HGB 8.5*   < > 8.4*  8.3*   < > 7.6*  7.6*   < > 7.5*   < > 9.0*   < > 9.2* 9.2* 9.3* 9.1* 9.1*  HCT 25.0*   < > 25.5*  25.3*   < > 23.1*  22.8*   < > 22.5*   < > 28.1*   < > 27.9* 28.9* 28.5* 28.2* 29.9*  MCV 87.7   < > 88.2  --  89.1  --  90.4  --  91.8  --   --   --   --   --  96.1  PLT 129*   < > 153  --  162  --  175  --  200  --   --   --   --   --  222   < > = values in this interval not displayed.   Cardiac Enzymes: No results for input(s): CKTOTAL, CKMB, CKMBINDEX, TROPONINI in the last 168 hours. BNP: BNP (last 3 results) No results for input(s): BNP in the last 8760 hours.  ProBNP (last 3 results) No results for input(s): PROBNP in the last 8760 hours.  CBG: Recent Labs  Lab 03/04/20 2355 03/05/20 0414 03/05/20 0733 03/05/20 1133 03/05/20 1718  GLUCAP 104* 96 96 75 133*       Signed:  Darlin Drop, MD [facility] 03/05/2020, 6:20 PM

## 2020-03-05 NOTE — Progress Notes (Signed)
[facility] at bedside to take pt to [facility] for CTA. VSS. Report given to [facility] team. Daughter at bedside and aware of plan. No further needs. Medical necessity packet given to [facility] team.

## 2020-03-05 NOTE — Progress Notes (Signed)
Progress Note  Patient Name: Haley Hernandez Date of Encounter: 03/05/2020  [facility] Cardiologist: Donato Schultz, MD ([place])  Subjective   No acute overnight events. No chest pain poor appetite   Inpatient Medications    Scheduled Meds:  sodium chloride   Intravenous Once   sodium chloride   Intravenous Once   busPIRone  5 mg Oral Q12H   Chlorhexidine Gluconate Cloth  6 each Topical Daily   cholecalciferol  1,000 Units Oral Daily   cloNIDine  0.1 mg Oral BID   gabapentin  100 mg Oral TID   hydrALAZINE  100 mg Oral BID   insulin aspart  0-9 Units Subcutaneous Q4H   mouth rinse  15 mL Mouth Rinse BID   nitroGLYCERIN  0.8 mg Sublingual Once   sodium chloride flush  3 mL Intravenous Q12H   Continuous Infusions:  sodium chloride 75 mL/hr at 03/05/20 0610   PRN Meds: acetaminophen, baclofen, hydrALAZINE, labetalol   Vital Signs    Vitals:   03/04/20 2049 03/05/20 0418 03/05/20 0753 03/05/20 0800  BP: 120/78 (!) 171/91 (!) 186/98 (!) 184/94  Pulse: 72 61 65   Resp: 18 18 18    Temp: 99 F (37.2 C) 97.8 F (36.6 C)    TempSrc:      SpO2: 95% 97% 95%   Weight:      Height:        Intake/Output Summary (Last 24 hours) at 03/05/2020 0817 Last data filed at 03/05/2020 0759 Gross per 24 hour  Intake 681.32 ml  Output 2850 ml  Net -2168.68 ml   Last 3 Weights 02/25/2020 02/25/2020 02/01/2020  Weight (lbs) 143 lb 4.8 oz 140 lb (No Data)  Weight (kg) 65 kg 63.504 kg (No Data)      Telemetry    NSR 03/05/2020  - Personally Reviewed  ECG    No acute ischemic changes. - Personally Reviewed  Physical Exam  Overweight black female  GEN: No acute distress.   Neck: No JVD Cardiac: RRR, no murmurs, rubs, or gallops.  Respiratory: Clear to auscultation bilaterally. GI: Soft, nontender, non-distended  MS: No edema; No deformity. Neuro:  Nonfocal  Psych: Normal affect   Labs    High Sensitivity Troponin:   Recent Labs  Lab 02/11/20 2231  02/12/20 0031 03/01/20 1233 03/01/20 1506 03/01/20 2037  TROPONINIHS 6 6 12 9 9       Chemistry Recent Labs  Lab 02/28/20 0244 02/29/20 0255 03/01/20 0237  NA 142 140 140  K 3.2* 3.3* 3.8  CL 108 106 104  CO2 27 28 28   GLUCOSE 97 108* 106*  BUN 6 <5* 7  CREATININE 0.59 0.75 0.59  CALCIUM 9.6 9.4 9.8  GFRNONAA >60 >60 >60  ANIONGAP 7 6 8      Hematology Recent Labs  Lab 03/01/20 0237 03/01/20 0851 03/02/20 1319 03/02/20 1543 03/03/20 1517 03/03/20 2043 03/05/20 0351  WBC 6.5  --  6.0  --   --   --  6.5  RBC 2.49*  --  3.06*  --   --   --  3.11*  HGB 7.5*   < > 9.0*   < > 9.3* 9.1* 9.1*  HCT 22.5*   < > 28.1*   < > 28.5* 28.2* 29.9*  MCV 90.4  --  91.8  --   --   --  96.1  MCH 30.1  --  29.4  --   --   --  29.3  MCHC 33.3  --  32.0  --   --   --  30.4  RDW 14.6  --  15.3  --   --   --  15.4  PLT 175  --  200  --   --   --  222   < > = values in this interval not displayed.    BNPNo results for input(s): BNP, PROBNP in the last 168 hours.   DDimer No results for input(s): DDIMER in the last 168 hours.   Radiology    No results found.  Cardiac Studies   Echocardiogram 03/01/2020: Impressions: 1. Left ventricular ejection fraction, by estimation, is 65 to 70%. The  left ventricle has hyperdynamic function. The left ventricle has no  regional wall motion abnormalities. There is moderate left ventricular  hypertrophy. Left ventricular diastolic  parameters are consistent with Grade I diastolic dysfunction (impaired  relaxation).  2. Right ventricular systolic function is normal. The right ventricular  size is normal. Tricuspid regurgitation signal is inadequate for assessing  PA pressure.  3. Left atrial size was mildly dilated.  4. A small pericardial effusion is present.  5. The mitral valve is normal in structure. No evidence of mitral valve  regurgitation. No evidence of mitral stenosis.  6. The aortic valve is tricuspid. Aortic valve  regurgitation is not  visualized. Mild aortic valve sclerosis is present, with no evidence of  aortic valve stenosis.  7. The inferior vena cava is normal in size with greater than 50%  respiratory variability, suggesting right atrial pressure of 3 mmHg.  Patient Profile     59 y.o. female with a history of prior stroke with intraventricular hemorrhage, COPD, obstructive sleep apnea, hypertension, diabetes, chronic anxiety who was admitted with GI bleed. Cardiology consulted for evaluation of chest pain at the Dr. Margo Aye.  Assessment & Plan    Chest Pain - Patient has had recurrent chest pain of unclear etiology over the past 2 weeks.  - EKG shows no acute ischemic changes. - High-sensitivity troponin negative x3. - Echo showed LVEF of 65-70% with normal wall motion, moderate LVH, grade 1 diastolic dysfunction, and small pericardial effusion. - Per Dr Anne Fu cardiac CT to be done today   Hypertension  -per primary service has allergies to ACE/ARB and norvasc Back on home clonidine increase hydralazine to TID  GI Bleed - Admitted with multiple episodes of hematochezia. - Colonoscopy on 02/28/2020 showed diverticular bleed. - S/p 4 units of PRBCs during hospital stay. - Hemoglobin stable at 9.1.  - Management per primary team and GI.  For questions or updates, please contact [facility] Please consult [email or web address] for contact info under        Charlton Haws MD [facility]

## 2020-03-05 NOTE — Progress Notes (Signed)
PROGRESS NOTE  Haley Hernandez IWP:[id number] DOB: 03/21/1961 DOA: 02/25/2020 PCP: Benita Stabile, MD  HPI/Recap of past 27 hours: 59 year-old female with past medical history significant for COPD, intraventricular hemorrhage with left-sided hemiplegia, obstructive sleep apnea, essential hypertension, chronic anxiety who initially presented to [facility], brought in by her daughter from home with 3 episodes of bright red blood per rectum.  She was transferred to [facility] where she had 5-6 additional large bloody stools.  She is not on oral anticoagulation at home.  She was seen by [facility].  Had a colonoscopy on 02/28/20 by Dr. Bosie Clos, which revealed congested, erythematous and ulcerated mucosa in the proximal ascending colon and in the cecum.  Biopsied, no evidence of malignancy.  Diverticulosis in the sigmoid colon, descending colon, and in the ascending colon.  Internal hemorrhoids.  Pathology returned with no evidence of malignancy. FINAL MICROSCOPIC DIAGNOSIS:   A. COLON, CECUM, BIOPSY:  - Colonic mucosa with focal hyperemia.  - No inflammatory changes, adenomatous change or malignancy.   She has had no recurrent GI bleed despite having bowel movements.  Hospital course complicated by sudden onset atypical chest pain on 03/01/20. Per her daughter she was having chest pain more frequently at home, in the past 2 weeksprior to her presentation. She was scheduled to have her first post hospitalization appointment with cardiology, Dr. Carmon Ginsberg, on 03/02/20 at 2:40PM but missed it.  Due to her chest pain in the hospital a work-up was completed. 12 lead EKG-no evidence of acute ischemia. Troponin S negative x3.  But she continued to have chest pain intermittently.  Cardiology recommended cardiac CTA on 03/05/2020 at [facility].  03/05/20: She reports that left chest tingles this morning, with 5 out of 10 discomfort, unclear if related to her anxiety.  She has cardiac CTA planned  today at [facility].  Assessment/Plan: Principal Problem:   Rectal bleeding Active Problems:   Type 2 diabetes mellitus with neurological complications (HCC)   Hypertension associated with type 2 diabetes mellitus (HCC)   Major depression, recurrent, chronic (HCC)   History of CVA (cerebrovascular accident)  ResolvedLower GI bleed secondary to diverticular bleed Admitted with multiple episodes of hematochezia First colonoscopy done on 02/28/2020 by [facility] with findings consistent with diverticular bleed: Inadequate preparation of the colon, congested erythematous and ulcerated mucosa in the proximal ascending colon and the cecum which were biopsied. Diverticulosis in the sigmoid colon, in the descending colon and in the ascending colon. Internal hemorrhoids. Lower GI bleedhas resolved, no recurrence despite having bowel movements. She received 4 units of blood transfusion during this hospital stay. Hemoglobin 9.1 on 03/05/2020 No overt bleeding. Will need to follow-up with GI outpatient.  Atypical chest pain, unclear etiology Per her daughter has been recurring more frequently at home in the last 2 weeks Missed her appointment with cardiology Dr. Bufford Buttner outpatient on 03/02/2020 at 2:40PM due to in patient work-up for recurrent chest pain-Seen by cardiology in-house. Twelve-lead EKG no evidence of acute ischemia Troponin S negative x3. 2D echo with the following findings: 1. Left ventricular ejection fraction, by estimation, is 65 to 70%. The  left ventricle has hyperdynamic function. The left ventricle has no  regional wall motion abnormalities. There is moderate left ventricular  hypertrophy. Left ventricular diastolic  parameters are consistent with Grade I diastolic dysfunction (impaired  relaxation).  2. Right ventricular systolic function is normal. The right ventricular  size is normal. Tricuspid regurgitation signal is inadequate for assessing  PA pressure.   3. Left atrial size was mildly dilated.  4. A small pericardial effusion is present.  5. The mitral valve is normal in structure. No evidence of mitral valve  regurgitation. No evidence of mitral stenosis.  6. The aortic valve is tricuspid. Aortic valve regurgitation is not  visualized. Mild aortic valve sclerosis is present, with no evidence of  aortic valve stenosis.  7. The inferior vena cava is normal in size with greater than 50%  respiratory variability, suggesting right atrial pressure of 3 mmHg.  Cardiac CTA planned on 03/05/2020  Resolved acute blood loss anemia secondary to lower GI bleed, likely diverticular bleed Management as per above.  Essential hypertension Blood pressure stable. Continue clonidine 0.1 mg twice daily, hydralazine 100 mg twice daily. Follow-up with your PCP.  Resolved transient bradycardia Reported 1 episode drop in heart rate to the 30s in the evening of 02/29/2020. Currently back in sinus rhythm Monitored on telemetry.  Resolved hypokalemia/hypomagnesemia Repleted Serum potassium 3.8, serum magnesium 2.3 on 03/01/2020.  Ambulatory dysfunction PT recommended home health PT Continue fall precautions. TOC assisting with home health services.   Code Status: Full code  Family Communication: Updated her daughter via phone.  Disposition Plan: To home with home health services   Consultants:  GI  Cardiology  Procedures:  Colonoscopy  Antimicrobials:  None  DVT prophylaxis: SCDs  Status is: Inpatient   Dispo:  Patient From: Home  Planned Disposition: Home with Health Care Svc  Expected discharge date: 03/06/20  Medically stable for discharge: No, ongoing work-up for atypical chest pain.        Objective: Vitals:   03/05/20 0800 03/05/20 0840 03/05/20 0842 03/05/20 1235  BP: (!) 184/94 (!) 136/94 (!) 136/94 132/83  Pulse:  77  68  Resp:    18  Temp:    98.4 F (36.9 C)  TempSrc:    Oral  SpO2:    96%   Weight:      Height:        Intake/Output Summary (Last 24 hours) at 03/05/2020 1442 Last data filed at 03/05/2020 0900 Gross per 24 hour  Intake 561.32 ml  Output 2900 ml  Net -2338.68 ml   Filed Weights   02/25/20 1407 02/25/20 2125  Weight: 63.5 kg 65 kg    Exam:  . General: 59 y.o. year-old female well-developed well-nourished in no acute stress.  Alert and interactive.   . Cardiovascular: Regular rate and rhythm no rubs or gallops. Marland Kitchen Respiratory: Clear to auscultation no wheezes or rales.  . Abdomen: Soft nontender normal bowel sounds present.  . Musculoskeletal: No lower extremity edema bilaterally. Marland Kitchen Psychiatry: Mood is appropriate for condition and setting.  Data Reviewed: CBC: Recent Labs  Lab 02/27/20 1649 02/27/20 1649 02/27/20 2334 02/27/20 2334 02/28/20 0244 02/28/20 1610 02/29/20 0255 02/29/20 0950 03/01/20 0237 03/01/20 0851 03/02/20 1319 03/02/20 1543 03/03/20 0301 03/03/20 0949 03/03/20 1517 03/03/20 2043 03/05/20 0351  WBC 6.4   < > 7.2   < > 7.3  --  7.1  --  6.5  --  6.0  --   --   --   --   --  6.5  NEUTROABS 4.2  --  4.4  --  4.5  --  4.7  --  4.3  --   --   --   --   --   --   --   --   HGB 8.9*   < > 8.5*   < >  8.4*  8.3*   < > 7.6*  7.6*   < > 7.5*   < > 9.0*   < > 9.2* 9.2* 9.3* 9.1* 9.1*  HCT 26.7*   < > 25.0*   < > 25.5*  25.3*   < > 23.1*  22.8*   < > 22.5*   < > 28.1*   < > 27.9* 28.9* 28.5* 28.2* 29.9*  MCV 89.0   < > 87.7   < > 88.2  --  89.1  --  90.4  --  91.8  --   --   --   --   --  96.1  PLT 65*   < > 129*   < > 153  --  162  --  175  --  200  --   --   --   --   --  222   < > = values in this interval not displayed.   Basic Metabolic Panel: Recent Labs  Lab 02/28/20 0244 02/29/20 0255 03/01/20 0237  NA 142 140 140  K 3.2* 3.3* 3.8  CL 108 106 104  CO2 27 28 28   GLUCOSE 97 108* 106*  BUN 6 <5* 7  CREATININE 0.59 0.75 0.59  CALCIUM 9.6 9.4 9.8  MG 1.8 1.6* 2.3   GFR: Estimated Creatinine Clearance: 66.2  mL/min (by C-G formula based on SCr of 0.59 mg/dL). Liver Function Tests: No results for input(s): AST, ALT, ALKPHOS, BILITOT, PROT, ALBUMIN in the last 168 hours. No results for input(s): LIPASE, AMYLASE in the last 168 hours. No results for input(s): AMMONIA in the last 168 hours. Coagulation Profile: No results for input(s): INR, PROTIME in the last 168 hours. Cardiac Enzymes: No results for input(s): CKTOTAL, CKMB, CKMBINDEX, TROPONINI in the last 168 hours. BNP (last 3 results) No results for input(s): PROBNP in the last 8760 hours. HbA1C: No results for input(s): HGBA1C in the last 72 hours. CBG: Recent Labs  Lab 03/04/20 1953 03/04/20 2355 03/05/20 0414 03/05/20 0733 03/05/20 1133  GLUCAP 96 104* 96 96 75   Lipid Profile: No results for input(s): CHOL, HDL, LDLCALC, TRIG, CHOLHDL, LDLDIRECT in the last 72 hours. Thyroid Function Tests: No results for input(s): TSH, T4TOTAL, FREET4, T3FREE, THYROIDAB in the last 72 hours. Anemia Panel: No results for input(s): VITAMINB12, FOLATE, FERRITIN, TIBC, IRON, RETICCTPCT in the last 72 hours. Urine analysis:    Component Value Date/Time   COLORURINE YELLOW 08/06/2019 0817   APPEARANCEUR HAZY (A) 08/06/2019 0817   LABSPEC 1.020 08/06/2019 0817   PHURINE 5.0 08/06/2019 0817   GLUCOSEU NEGATIVE 08/06/2019 0817   HGBUR NEGATIVE 08/06/2019 0817   BILIRUBINUR NEGATIVE 08/06/2019 0817   KETONESUR NEGATIVE 08/06/2019 0817   PROTEINUR NEGATIVE 08/06/2019 0817   NITRITE NEGATIVE 08/06/2019 0817   LEUKOCYTESUR SMALL (A) 08/06/2019 0817   Sepsis Labs: @LABRCNTIP (procalcitonin:4,lacticidven:4)  ) Recent Results (from the past 240 hour(s))  Respiratory Panel by RT PCR (Flu A&B, Covid) - Nasopharyngeal Swab     Status: None   Collection Time: 02/25/20  3:19 PM   Specimen: Nasopharyngeal Swab  Result Value Ref Range Status   SARS Coronavirus 2 by RT PCR NEGATIVE NEGATIVE Final    Comment: (NOTE) SARS-CoV-2 target nucleic acids are  NOT DETECTED.  The SARS-CoV-2 RNA is generally detectable in upper respiratoy specimens during the acute phase of infection. The lowest concentration of SARS-CoV-2 viral copies this assay can detect is 131 copies/mL. A negative result does not preclude SARS-Cov-2 infection and should  not be used as the sole basis for treatment or other patient management decisions. A negative result may occur with  improper specimen collection/handling, submission of specimen other than nasopharyngeal swab, presence of viral mutation(s) within the areas targeted by this assay, and inadequate number of viral copies (<131 copies/mL). A negative result must be combined with clinical observations, patient history, and epidemiological information. The expected result is Negative.  Fact Sheet for Patients:  https://www.moore.com/  Fact Sheet for Healthcare Providers:  https://www.young.biz/  This test is no t yet approved or cleared by the [place] FDA and  has been authorized for detection and/or diagnosis of SARS-CoV-2 by FDA under an Emergency Use Authorization (EUA). This EUA will remain  in effect (meaning this test can be used) for the duration of the COVID-19 declaration under Section 564(b)(1) of the Act, 21 U.S.C. section 360bbb-3(b)(1), unless the authorization is terminated or revoked sooner.     Influenza A by PCR NEGATIVE NEGATIVE Final   Influenza B by PCR NEGATIVE NEGATIVE Final    Comment: (NOTE) The Xpert Xpress SARS-CoV-2/FLU/RSV assay is intended as an aid in  the diagnosis of influenza from Nasopharyngeal swab specimens and  should not be used as a sole basis for treatment. Nasal washings and  aspirates are unacceptable for Xpert Xpress SARS-CoV-2/FLU/RSV  testing.  Fact Sheet for Patients: https://www.moore.com/  Fact Sheet for Healthcare Providers: https://www.young.biz/  This test is not yet  approved or cleared by the [place] FDA and  has been authorized for detection and/or diagnosis of SARS-CoV-2 by  FDA under an Emergency Use Authorization (EUA). This EUA will remain  in effect (meaning this test can be used) for the duration of the  Covid-19 declaration under Section 564(b)(1) of the Act, 21  U.S.C. section 360bbb-3(b)(1), unless the authorization is  terminated or revoked. Performed at [facility], [address] [place], [place] [address]   MRSA PCR Screening     Status: None   Collection Time: 02/25/20  9:25 PM   Specimen: Nasal Mucosa; Nasopharyngeal  Result Value Ref Range Status   MRSA by PCR NEGATIVE NEGATIVE Final    Comment:        The GeneXpert MRSA Assay (FDA approved for NASAL specimens only), is one component of a comprehensive MRSA colonization surveillance program. It is not intended to diagnose MRSA infection nor to guide or monitor treatment for MRSA infections. Performed at [facility], [address], [place], [place] [address]       Studies: CT CORONARY MORPH W/CTA COR W/SCORE W/CA W/CM &/OR WO/CM  Result Date: 03/05/2020 EXAM: OVER-READ INTERPRETATION  CT CHEST The following report is an over-read performed by radiologist Dr. Genevive Bi of [facility] on 03/05/2020. This over-read does not include interpretation of cardiac or coronary anatomy or pathology. The coronary CTA interpretation by the cardiologist is attached. COMPARISON:  None. FINDINGS: Limited view of the lung parenchyma demonstrates no suspicious nodularity. Airways are normal. Limited view of the mediastinum demonstrates no adenopathy. Esophagus normal. Limited view of the upper abdomen unremarkable. Limited view of the skeleton and chest wall is unremarkable. IMPRESSION: No significant extracardiac findings. Electronically Signed   By: Genevive Bi M.D.   On: 03/05/2020 14:35    Scheduled Meds: . sodium chloride    Intravenous Once  . sodium chloride   Intravenous Once  . busPIRone  5 mg Oral Q12H  . Chlorhexidine Gluconate Cloth  6 each Topical Daily  . cholecalciferol  1,000 Units Oral Daily  . cloNIDine  0.1 mg Oral BID  . gabapentin  100 mg Oral TID  . hydrALAZINE  100 mg Oral BID  . insulin aspart  0-9 Units Subcutaneous Q4H  . mouth rinse  15 mL Mouth Rinse BID  . sodium chloride flush  3 mL Intravenous Q12H    Continuous Infusions: . sodium chloride 75 mL/hr at 03/05/20 0610     LOS: 9 days     Darlin Drop, MD [facility] Pager [phone]  If 7PM-7AM, please contact night-coverage [email or web address] Password [email or web address] 03/05/2020, 2:42 PM

## 2020-03-05 NOTE — Progress Notes (Signed)
PT Cancellation Note  Patient Details Name: Haley Hernandez MRN: [id number] DOB: October 31, 1960   Cancelled Treatment:    Reason Eval/Treat Not Completed: Patient at procedure or test/unavailable. Will check back another day.    Faye Ramsay, PT Acute Rehabilitation  Office: [phone] Pager: [phone]

## 2020-04-03 ENCOUNTER — Ambulatory Visit: Payer: 59 | Admitting: Cardiology

## 2020-04-03 ENCOUNTER — Other Ambulatory Visit: Payer: Self-pay

## 2020-04-03 ENCOUNTER — Encounter: Payer: Self-pay | Admitting: Cardiology

## 2020-04-03 VITALS — BP 140/90 | HR 57 | Ht 61.0 in | Wt 138.0 lb

## 2020-04-03 DIAGNOSIS — E1159 Type 2 diabetes mellitus with other circulatory complications: Secondary | ICD-10-CM

## 2020-04-03 DIAGNOSIS — I2584 Coronary atherosclerosis due to calcified coronary lesion: Secondary | ICD-10-CM | POA: Diagnosis not present

## 2020-04-03 DIAGNOSIS — I152 Hypertension secondary to endocrine disorders: Secondary | ICD-10-CM | POA: Diagnosis not present

## 2020-04-03 DIAGNOSIS — I2510 Atherosclerotic heart disease of native coronary artery without angina pectoris: Secondary | ICD-10-CM | POA: Diagnosis not present

## 2020-04-03 MED ORDER — ROSUVASTATIN CALCIUM 10 MG PO TABS: 10.0000 mg | ORAL_TABLET | Freq: Every day | ORAL | 3 refills | Status: DC

## 2020-04-03 NOTE — Progress Notes (Signed)
Cardiology Office Note:    Date:  04/03/2020   ID:  Haley Hernandez, DOB 09-24-1960, MRN [id number]  PCP:  Benita Stabile, MD  [facility] Cardiologist:  Donato Schultz, MD  [facility] Electrophysiologist:  None   Referring MD: Benita Stabile, MD     History of Present Illness:    Haley Hernandez is a 59 y.o. female here for the follow-up of chest pain having been evaluated in the hospital on 03/02/2020.  Has stroke with intraventricular hemorrhage obstructive sleep apnea diabetes hypertension chronic anxiety.  During that admission had 5-6 large bloody stools.  Colonoscopy on 02/28/2020 showed focal hyperemia.  No recurrent GI bleed.  Just prior to discharge however she started having chest discomfort off and on intermittent which she actually describes over several days and weeks.  Dull left-sided 8 out of 10.  Past Medical History:  Diagnosis Date  . COPD 06/27/2008   Qualifier: Diagnosis of  By: Craige Cotta MD, [place]    . Diabetes mellitus without complication (HCC)   . Hypertension   . IVH (intraventricular hemorrhage) (HCC) 07/18/2019  . OBSTRUCTIVE SLEEP APNEA 06/27/2008   Qualifier: Diagnosis of  By: Craige Cotta MD, [place]    . Stroke [facility])     Past Surgical History:  Procedure Laterality Date  . ABDOMINAL HYSTERECTOMY    . BIOPSY  02/28/2020   Procedure: BIOPSY;  Surgeon: Charlott Rakes, MD;  Location: [facility] ENDOSCOPY;  Service: Endoscopy;;  . COLONOSCOPY N/A 02/28/2020   Procedure: COLONOSCOPY;  Surgeon: Charlott Rakes, MD;  Location: [facility] ENDOSCOPY;  Service: Endoscopy;  Laterality: N/A;  . IR GASTROSTOMY TUBE MOD SED  08/03/2019  . IR GASTROSTOMY TUBE REMOVAL  11/21/2019    Current Medications: Current Meds  Medication Sig  . albuterol (ACCUNEB) 1.25 MG/3ML nebulizer solution Take 1 ampule by nebulization every 6 (six) hours as needed for wheezing.  Marland Kitchen albuterol (VENTOLIN HFA) 108 (90 Base) MCG/ACT inhaler Inhale 2 puffs into the lungs every 6 (six) hours as needed for  wheezing or shortness of breath.  . Baclofen 5 MG TABS Take 1-2 tablets by mouth at bedtime.  . Cholecalciferol (D3-1000) 25 MCG (1000 UT) tablet Take 1,000 Units by mouth daily.  Marland Kitchen docusate sodium (COLACE) 100 MG capsule Take 100 mg by mouth daily.  Marland Kitchen escitalopram (LEXAPRO) 5 MG tablet Take by mouth.  . gabapentin (NEURONTIN) 100 MG capsule Take 100 mg by mouth 3 (three) times daily.     Allergies:   Losartan, Norvasc [amlodipine], Ace inhibitors, and Penicillins   Social History   Socioeconomic History  . Marital status: Single    Spouse name: Not on file  . Number of children: Not on file  . Years of education: Not on file  . Highest education level: Not on file  Occupational History  . Not on file  Tobacco Use  . Smoking status: Never Smoker  . Smokeless tobacco: Never Used  Substance and Sexual Activity  . Alcohol use: Not Currently  . Drug use: Not Currently  . Sexual activity: Not on file  Other Topics Concern  . Not on file  Social History Narrative  . Not on file   Social Determinants of Health   Financial Resource Strain:   . Difficulty of Paying Living Expenses: Not on file  Food Insecurity:   . Worried About Programme researcher, broadcasting/film/video in the Last Year: Not on file  . Ran Out of Food in the Last Year: Not on file  Transportation Needs:   .  Lack of Transportation (Medical): Not on file  . Lack of Transportation (Non-Medical): Not on file  Physical Activity:   . Days of Exercise per Week: Not on file  . Minutes of Exercise per Session: Not on file  Stress:   . Feeling of Stress : Not on file  Social Connections:   . Frequency of Communication with Friends and Family: Not on file  . Frequency of Social Gatherings with Friends and Family: Not on file  . Attends Religious Services: Not on file  . Active Member of Clubs or Organizations: Not on file  . Attends Banker Meetings: Not on file  . Marital Status: Not on file     Family History: The  patient's family history includes Diabetes in her father; Hypertension in her father.  ROS:   Please see the history of present illness.    Denies any fevers chills nausea vomiting syncope bleeding all other systems reviewed and are negative.  EKGs/Labs/Other Studies Reviewed:    The following studies were reviewed today:   ECHO 03/01/20: 1. Left ventricular ejection fraction, by estimation, is 65 to 70%. The  left ventricle has hyperdynamic function. The left ventricle has no  regional wall motion abnormalities. There is moderate left ventricular  hypertrophy. Left ventricular diastolic  parameters are consistent with Grade I diastolic dysfunction (impaired  relaxation).  2. Right ventricular systolic function is normal. The right ventricular  size is normal. Tricuspid regurgitation signal is inadequate for assessing  PA pressure.  3. Left atrial size was mildly dilated.  4. A small pericardial effusion is present.  5. The mitral valve is normal in structure. No evidence of mitral valve  regurgitation. No evidence of mitral stenosis.  6. The aortic valve is tricuspid. Aortic valve regurgitation is not  visualized. Mild aortic valve sclerosis is present, with no evidence of  aortic valve stenosis.  7. The inferior vena cava is normal in size with greater than 50%  respiratory variability, suggesting right atrial pressure of 3 mmHg.     Recent Labs: 02/25/2020: ALT 14 03/01/2020: BUN 7; Creatinine, Ser 0.59; Magnesium 2.3; Potassium 3.8; Sodium 140 03/05/2020: Hemoglobin 9.1; Platelets 222  Recent Lipid Panel    Component Value Date/Time   CHOL 180 07/20/2019 0556   TRIG 201 (H) 07/30/2019 0637   HDL 45 07/20/2019 0556   CHOLHDL 4.0 07/20/2019 0556   VLDL 72 (H) 07/20/2019 0556   LDLCALC 63 07/20/2019 0556     Physical Exam:    VS:  BP 140/90   Pulse (!) 57   Ht 5\' 1"  (1.549 m)   Wt 138 lb (62.6 kg) Comment: per patient's daughter. unable to weigh here in  office  SpO2 95%   BMI 26.07 kg/m     Wt Readings from Last 3 Encounters:  04/03/20 138 lb (62.6 kg)  02/25/20 143 lb 4.8 oz (65 kg)  10/20/19 140 lb (63.5 kg)     GEN:  Well nourished, well developed in no acute distress HEENT: Normal NECK: No JVD; No carotid bruits LYMPHATICS: No lymphadenopathy CARDIAC: RRR, no murmurs, rubs, gallops RESPIRATORY:  Clear to auscultation without rales, wheezing or rhonchi  ABDOMEN: Soft, non-tender, non-distended MUSCULOSKELETAL:  No edema; No deformity  SKIN: Warm and dry NEUROLOGIC: Left-sided weakness noted PSYCHIATRIC:  Normal affect   ASSESSMENT:    1. Hypertension associated with type 2 diabetes mellitus (HCC)   2. Coronary artery calcification    PLAN:    In order of problems listed  above:  Chest pain-atypical/mild coronary plaque. -EKG unremarkable troponin unremarkable echocardiogram reassuring. -Cardiac CT was performed on 03/05/2020 and showed the following: IMPRESSION: 1. Coronary calcium score of 2. This was 59 percentile for age and sex matched control.  2. Normal coronary origin with LEFT dominance.  3. No evidence of significant flow limiting CAD in interpretable vessel segments (stair step artifact, likely breathing artifact noted). There is a small focal calcified plaque in ostial LAD of 0-24% stenosis.  4.  Small pericardial effusion is present  5. Mild descending aortic atherosclerosis. Mildly dilated ascending aorta, 38 mm.  Continue with good blood pressure control.  Overall reassuring.  Continue with aggressive medical management.  I would encourage use of Crestor for her for plaque stabilization.  We talked about this, we will start Crestor 10 mg once a day.  She was a little bit hesitant to start any medication.  Last LDL excellent at 63.  Continue to monitor.  Essential hypertension -Medications reviewed as above.  Continue with good overall management.    Stroke -Left-sided weakness once again  noted.  Anemia/GI bleed -Hemoglobin 9.1 on 03/05/2020.  Per primary team.  Avoid aspirin.    Shared Decision Making/Informed Consent        Medication Adjustments/Labs and Tests Ordered: Current medicines are reviewed at length with the patient today.  Concerns regarding medicines are outlined above.  No orders of the defined types were placed in this encounter.  Meds ordered this encounter  Medications  . rosuvastatin (CRESTOR) 10 MG tablet    Sig: Take 1 tablet (10 mg total) by mouth daily.    Dispense:  90 tablet    Refill:  3    Patient Instructions  Medication Instructions:  Please start Crestor 10 mg once a day. Continue all other medications as listed.  *If you need a refill on your cardiac medications before your next appointment, please call your pharmacy*  Follow-Up: At [facility], you and your health needs are our priority.  As part of our continuing mission to provide you with exceptional heart care, we have created designated Provider Care Teams.  These Care Teams include your primary Cardiologist (physician) and Advanced Practice Providers (APPs -  Physician Assistants and Nurse Practitioners) who all work together to provide you with the care you need, when you need it.  We recommend signing up for the patient portal called "MyChart".  Sign up information is provided on this After Visit Summary.  MyChart is used to connect with patients for Virtual Visits (Telemedicine).  Patients are able to view lab/test results, encounter notes, upcoming appointments, etc.  Non-urgent messages can be sent to your provider as well.   To learn more about what you can do with MyChart, go to [email or web address].    Your next appointment:   See Dr Anne Fu back as needed.  Thank you for choosing [facility]!!        Signed, Donato Schultz, MD  04/03/2020 3:24 PM    [facility]

## 2020-04-03 NOTE — Patient Instructions (Signed)
Medication Instructions:  Please start Crestor 10 mg once a day. Continue all other medications as listed.  *If you need a refill on your cardiac medications before your next appointment, please call your pharmacy*  Follow-Up: At [facility], you and your health needs are our priority.  As part of our continuing mission to provide you with exceptional heart care, we have created designated Provider Care Teams.  These Care Teams include your primary Cardiologist (physician) and Advanced Practice Providers (APPs -  Physician Assistants and Nurse Practitioners) who all work together to provide you with the care you need, when you need it.  We recommend signing up for the patient portal called "MyChart".  Sign up information is provided on this After Visit Summary.  MyChart is used to connect with patients for Virtual Visits (Telemedicine).  Patients are able to view lab/test results, encounter notes, upcoming appointments, etc.  Non-urgent messages can be sent to your provider as well.   To learn more about what you can do with MyChart, go to [email or web address].    Your next appointment:   See Dr Anne Fu back as needed.  Thank you for choosing [facility]!!

## 2020-04-20 ENCOUNTER — Other Ambulatory Visit (HOSPITAL_COMMUNITY): Payer: Self-pay

## 2020-04-21 ENCOUNTER — Telehealth (HOSPITAL_COMMUNITY): Payer: Self-pay | Admitting: Nurse Practitioner

## 2020-04-21 NOTE — Telephone Encounter (Signed)
Called to Discuss with patient about Covid symptoms and the use of a monoclonal antibody infusion for those with mild to moderate Covid symptoms and at a high risk of hospitalization.     Pt is qualified for this infusion at the [place] infusion center due to co-morbid conditions and/or a member of an at-risk group.     Unable to reach pt. Left message to return call. Sent mychart message.   Cathlyn Tersigni, DNP, AGNP-C [phone] (Infusion Center Hotline)  

## 2020-05-08 DIAGNOSIS — Z0271 Encounter for disability determination: Secondary | ICD-10-CM

## 2020-07-09 ENCOUNTER — Other Ambulatory Visit (HOSPITAL_COMMUNITY): Payer: Self-pay | Admitting: Internal Medicine

## 2020-07-09 DIAGNOSIS — Z1231 Encounter for screening mammogram for malignant neoplasm of breast: Secondary | ICD-10-CM

## 2020-07-16 ENCOUNTER — Other Ambulatory Visit: Payer: Self-pay | Admitting: Internal Medicine

## 2020-07-16 DIAGNOSIS — Z13820 Encounter for screening for osteoporosis: Secondary | ICD-10-CM

## 2020-08-01 ENCOUNTER — Ambulatory Visit (INDEPENDENT_AMBULATORY_CARE_PROVIDER_SITE_OTHER): Payer: 59 | Admitting: Adult Health

## 2020-08-01 ENCOUNTER — Encounter: Payer: Self-pay | Admitting: Adult Health

## 2020-08-01 VITALS — BP 153/83 | HR 63 | Ht 61.0 in | Wt 147.0 lb

## 2020-08-01 DIAGNOSIS — I612 Nontraumatic intracerebral hemorrhage in hemisphere, unspecified: Secondary | ICD-10-CM

## 2020-08-01 DIAGNOSIS — G8114 Spastic hemiplegia affecting left nondominant side: Secondary | ICD-10-CM | POA: Diagnosis not present

## 2020-08-01 MED ORDER — BACLOFEN 5 MG PO TABS
ORAL_TABLET | ORAL | 5 refills | Status: DC
Start: 2020-08-01 — End: 2021-01-28

## 2020-08-01 MED ORDER — DULOXETINE HCL 60 MG PO CPEP
60.0000 mg | ORAL_CAPSULE | Freq: Every day | ORAL | 3 refills | Status: DC
Start: 2020-08-01 — End: 2021-07-22

## 2020-08-01 MED ORDER — BACLOFEN 5 MG PO TABS
ORAL_TABLET | ORAL | 5 refills | Status: DC
Start: 2020-08-01 — End: 2020-08-01

## 2020-08-01 NOTE — Progress Notes (Signed)
[facility] [address] [place]. [place] [address] [phone]       STROKE FOLLOW UP NOTE  [name] Date of Birth:  11/14/1960 Medical Record Number:  [id number]   Reason for Referral: stroke follow up    SUBJECTIVE:   CHIEF COMPLAINT:  Chief Complaint  Patient presents with  . Follow-up    RM 14 with daughter [name]) Pt is having L side weakness, gait immobility and pain    HPI:   Today, 08/01/2020, Haley Hernandez returns for 32-month stroke follow-up accompanied by her daughter, Ed Blalock since prior visit without new stroke/TIA symptoms Residual left spastic hemiplegia and dysarthria  Continues to work with PT and has been ambulating during sessions with AFO brace and hemiwalker!!  C/o spasticity and nerve pain -currently on baclofen 5 mg twice daily, gabapentin 100 mg 3 times daily and recently started on duloxetine Continues to reside with her daughter who is her primary caregiver  Compliant on Crestor 10 mg daily -denies associated side effects Blood pressure today 153/83 -monitors at home and typically stable - per daughter, blood pressure typically elevated at doctor's appointments and when in pain HTN and HLD routinely monitored by cardiology  No further concerns at this time     History provided for reference purposes only Update 02/01/2020 JM: Haley Hernandez returns for stroke follow-up accompanied by her daughter.  Stable since prior visit with residual deficits left spastic hemiplegia, and dysarthria. No residual swallowing difficulties on a regular diet- PEG tube has since been removed. She is currently working with outpatient [facility]  She is able to transfer via stand/pivot.  PCP recently started baclofen 5 mg 1-2 tabs at bedtime for spasticity.  Daughter primary caregiver. Denies new or worsening stroke/TIA symptoms. Blood pressure today 150/92.  Occasionally monitors at home which has been stable.   No concerns at this time.  Update 10/20/2019 JM: Haley Hernandez is being seen for hospital follow-up accompanied by her daughter and granddaughter. Initially discharged to SNF but returned home with family yesterday. Residual deficits of left hemiplegia, dysphagia s/p PEG tube and modified diet, speech impairment, and right eye deviation.  In the process of setting up home health therapy and has PT coming at home today financial evaluation.  She is nonambulatory and transfers via wheelchair.  Her daughter was present during visit as well as her other daughter are currently caring for her full-time.  Diet modified without difficulty (daughter reports chopped food with thin liquids) and supplemental nutrition as needed via PEG.  Blood pressure today initially elevated and on recheck 146/82.  No further concerns at this time.  Stroke admission 07/18/2019 Haley Hernandez is a 60 y.o. female with history of ICH 7 yrs ago and HTN found unresponsive on 07/18/2019 with L sided hemiplegia, R gaze, and SBP 250. 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 Serial repeat CT heads below with eventual improvement on repeat CT head 4/5. 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 EEG showed severe diffuse encephalopathy with repeat EEG 4/5 R frontal temporal slowing c/w ICH, no seizure activity. LDL 63 and A1c 5.7. No prior use of antithrombotic and no indication for initiation given hemorrhage. Hypertensive emergency treated with Cleviprex and transition to Coreg 25 mg twice daily and chlorthalidone 12.5 mg daily with long-term BP goal normotensive range. No prior history of DM with hyperglycemia during admission  requiring insulin coverage. Other stroke risk factors include obesity, prior history of ICH 7 years ago (no data available via epic) and obstructive sleep apnea. Residual deficits of dysphagia s/p PEG,  nonverbal, no spontaneous extremity movement and left eye downward gaze, and right eye midposition with slight downward gaze. Palliative care met with family members and remain full code with full scope of treatment. Evaluated by therapy and recommended discharge to SNF.  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 Moderate IVH. Mass effect w/ partial effacment R lateral and 3rd ventricles. 66mm L midline shift. Suspicious for early hydrocephalus.   Repeat CT head 3/16 interval L frontal lobe EVD w/ tip at R thalamus. Interval increased in R ICH now w/ 78mm L midline shift. IVH extended into B lateral, 3rd and 4th ventricles. Prominent B superior ophthalmic vein suggestion increased ICP.   CT repeat 3/17 - stable MLS 74mm, hematoma and hydrocephalus  CT repeat 3/20 - stable hematoma and edeam. Decreased hydrocephalus.   CT repeat 3/25 evolution R thalamic ICH w/ decreasing clot. Increased ventricular volume s/p EVD removal. 1cm midline shift. Sinusitis.  CT repeat 3/26 - Stable volume of right thalamic and intraventricular hemorrhage. Ventriculomegaly with 2 mm of increased lateral ventricular diameter when compared yesterday.  CT repeat 3/28 - Slight interval decrease in size of right thalamic hemorrhage, but with similar degree of surrounding vasogenic edema. Localized 9 mm right-to-left shift relatively unchanged. Intraventricular extension with small volume intraventricular blood, stable. Lateral ventriculomegaly not significantly changed.  CT repeat 4/5 improvement in R thalamic hemorrhage, small ICH w/ improvement. Stable mild ventricular enlargement. Slight improvement in L midline shift.   2D Echo EF 60-65%. Severe LVH. 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 Pt to remain Full Code with full scope of treatment.       ROS:   14 system review of systems performed and negative with exception of those listed in HPI  PMH:  Past Medical History:  Diagnosis Date  . COPD 06/27/2008   Qualifier: Diagnosis of  By: Halford Chessman MD, [place]    . Diabetes mellitus without complication ([place])   . Hypertension   . IVH (intraventricular hemorrhage) ([place]) 07/18/2019  . OBSTRUCTIVE SLEEP APNEA 06/27/2008   Qualifier: Diagnosis of  By: Halford Chessman MD, [place]    . Stroke [facility])     PSH:  Past Surgical History:  Procedure Laterality Date  . ABDOMINAL HYSTERECTOMY    . BIOPSY  02/28/2020   Procedure: BIOPSY;  Surgeon: Wilford Corner, MD;  Location: [facility] ENDOSCOPY;  Service: Endoscopy;;  . COLONOSCOPY N/A 02/28/2020   Procedure: COLONOSCOPY;  Surgeon: Wilford Corner, MD;  Location: [facility] ENDOSCOPY;  Service: Endoscopy;  Laterality: N/A;  . IR GASTROSTOMY TUBE MOD SED  08/03/2019  . IR GASTROSTOMY TUBE REMOVAL  11/21/2019    Social History:  Social History   Socioeconomic History  . Marital status: Single    Spouse name: Not on file  . Number of children: Not on file  .  Years of education: Not on file  . Highest education level: Not on file  Occupational History  . Not on file  Tobacco Use  . Smoking status: Never Smoker  . Smokeless tobacco: Never Used  Substance and Sexual Activity  . Alcohol use: Not Currently  . Drug use: Not Currently  . Sexual activity: Not on file  Other Topics Concern  . Not on file  Social History Narrative  . Not on file   Social Determinants of Health   Financial Resource Strain: Not on file  Food Insecurity: Not on file  Transportation Needs: Not on file   Physical Activity: Not on file  Stress: Not on file  Social Connections: Not on file  Intimate Partner Violence: Not on file    Family History:  Family History  Problem Relation Age of Onset  . Hypertension Father   . Diabetes Father     Medications:   Current Outpatient Medications on File Prior to Visit  Medication Sig Dispense Refill  . albuterol (ACCUNEB) 1.25 MG/3ML nebulizer solution Take 1 ampule by nebulization every 6 (six) hours as needed for wheezing.    Marland Kitchen albuterol (VENTOLIN HFA) 108 (90 Base) MCG/ACT inhaler Inhale 2 puffs into the lungs every 6 (six) hours as needed for wheezing or shortness of breath.    Marland Kitchen amLODipine (NORVASC) 2.5 MG tablet Take 2.5 mg by mouth daily.    . busPIRone (BUSPAR) 5 MG tablet Take 1 tablet (5 mg total) by mouth every 12 (twelve) hours. 60 tablet 0  . Cholecalciferol (D3-1000) 25 MCG (1000 UT) tablet Take 1,000 Units by mouth daily.    . cloNIDine (CATAPRES) 0.1 MG tablet Take 1 tablet (0.1 mg total) by mouth 2 (two) times daily. 60 tablet 0  . docusate sodium (COLACE) 100 MG capsule Take 100 mg by mouth daily.    Marland Kitchen gabapentin (NEURONTIN) 100 MG capsule Take 100 mg by mouth 3 (three) times daily.    . hydrALAZINE (APRESOLINE) 100 MG tablet Take 1 tablet (100 mg total) by mouth 2 (two) times daily. 60 tablet 0  . rosuvastatin (CRESTOR) 10 MG tablet Take 1 tablet (10 mg total) by mouth daily. 90 tablet 3   No current facility-administered medications on file prior to visit.    Allergies:   Allergies  Allergen Reactions  . Losartan Swelling  . Norvasc [Amlodipine] Swelling  . Ace Inhibitors     History of swelling with losartan  . Penicillins     Unable to verify reactions at this time/ Tolerated ceftriaxone March 2021      OBJECTIVE:  Physical Exam  Vitals:   08/01/20 0744  BP: (!) 153/83  Pulse: 63  Weight: 147 lb (66.7 kg)  Height: 5\' 1"  (1.549 m)   Body mass index is 27.78 kg/m. No exam data present  General: well  developed, well nourished, pleasant middle-age African-American female, seated, in no evident distress Head: head normocephalic and atraumatic.   Neck: supple with no carotid or supraclavicular bruits Cardiovascular: regular rate and rhythm, no murmurs Musculoskeletal: no deformity Skin:  no rash/petichiae Vascular:  Normal pulses all extremities   Neurologic Exam Mental Status: Awake and fully alert. Mild to moderate dysarthria. Oriented to place and time. Recent and remote memory impaired. Attention span, concentration and fund of knowledge impaired with daughter providing history. Mood and affect appropriate.  Cranial Nerves: Right gaze preference but able to cross midline. Visual fields decreased blink to threat on left.  Hearing intact. Facial sensation intact.  Left lower facial weakness Motor: Full strength right upper and lower extremity.  LUE: 0/5 with increased tone LLE: 2/5 with increased tone and AFO brace (no movement at prior visit!!) Sensory.:  Left hemisensory impairment Coordination: Rapid alternating movements normal on right side. Finger-to-nose and heel-to-shin performed accurately on right side. Gait and Station: deferred as only ambulatory during PT sessions Reflexes: brisk left side; 1+ and symmetric. Toes downgoing.        ASSESSMENT: Haley Hernandez is a 60 y.o. 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 Vascular risk factors include HTN and prior ICH 7 years ago.     PLAN:  1. Right thalamic and BG ICH:  a. Residual deficits: Left spastic hemiparesis with paresthesias, dysarthria and visual impairment i. Continue working with PT - has made progress and now ambulating with PT ii. Spasticity: Increase evening baclofen dose to 10 mg  and continue 66m AM iii. Paresthesias: Increase duloxetine from 30 mg to 60 mg for potential benefit of both nerve pain and anxiety and continue gabapentin 1019m3 times daily b. Continue Crestor 10 mg daily for secondary stroke prevention c. Avoidance of aspirin, aspirin-containing products and ibuprofen products [place] No indication to initiate antithrombotic as no prior stroke history or cardiac/vascular stents d. Discussed importance of close PCP follow-up for aggressive stroke risk factor management  2. HTN: BP goal<130/90.  Slightly elevated today but stable at home.  On amlodipine, clonidine and hydralazine per PCP/cardiology    Follow up in 6 months or call earlier if needed   CC:  [facility] provider: Dr. SeBroadus JohnJoEdwinna AreolaMD    I spent 30 minutes of face-to-face and non-face-to-face time with patient and daughter.  This included previsit chart review, lab review, study review, order entry, electronic health record documentation, patient and daughter education and discussion regarding prior stroke, residual deficits and improvement since prior visit and ongoing symptom management, importance of managing stroke risk factors and answered all other questions to patient and daughters satisfaction  JeFrann RiderAGChildren'S Rehabilitation CenterGuHe[facility] [address] [address]Phone [phone]: This document was prepared with digital dictation and possible smart phrase technology. Any transcriptional errors that result from this process are unintentional.

## 2020-08-01 NOTE — Patient Instructions (Addendum)
You have been making excellent progress since I saw you last!! Keep up the good work!!!  Your Plan:  Recommend increasing baclofen evening dosage to 10mg  and continue 5mg  in the morning  Increase duloxetine from 30 mg to 60 mg to hopefully help with nerve pain and anxiety  Continue working with PT for hopeful further recovery    Follow-up in 6 months or call earlier if needed     Thank you for coming to see at [facility]. I hope we have been able to provide you high quality care today.  You may receive a patient satisfaction survey over the next few weeks. We would appreciate your feedback and comments so that we may continue to improve ourselves and the health of our patients.

## 2020-08-02 NOTE — Progress Notes (Signed)
I agree with the above plan 

## 2020-09-10 ENCOUNTER — Ambulatory Visit: Payer: 59

## 2020-09-10 ENCOUNTER — Other Ambulatory Visit: Payer: Self-pay | Admitting: Internal Medicine

## 2020-09-19 ENCOUNTER — Other Ambulatory Visit: Payer: Self-pay

## 2020-09-19 ENCOUNTER — Telehealth: Payer: Self-pay | Admitting: Adult Health

## 2020-09-19 ENCOUNTER — Encounter (HOSPITAL_COMMUNITY): Payer: Self-pay | Admitting: Emergency Medicine

## 2020-09-19 ENCOUNTER — Emergency Department (HOSPITAL_COMMUNITY)
Admission: EM | Admit: 2020-09-19 | Discharge: 2020-09-19 | Disposition: A | Discharge status: Home / Self Care | Payer: 59 | Attending: Emergency Medicine | Admitting: Emergency Medicine

## 2020-09-19 ENCOUNTER — Emergency Department (HOSPITAL_COMMUNITY): Payer: 59

## 2020-09-19 DIAGNOSIS — I10 Essential (primary) hypertension: Secondary | ICD-10-CM | POA: Diagnosis present

## 2020-09-19 DIAGNOSIS — M25512 Pain in left shoulder: Secondary | ICD-10-CM | POA: Diagnosis not present

## 2020-09-19 DIAGNOSIS — Z79899 Other long term (current) drug therapy: Secondary | ICD-10-CM | POA: Insufficient documentation

## 2020-09-19 DIAGNOSIS — E1140 Type 2 diabetes mellitus with diabetic neuropathy, unspecified: Secondary | ICD-10-CM | POA: Insufficient documentation

## 2020-09-19 DIAGNOSIS — J449 Chronic obstructive pulmonary disease, unspecified: Secondary | ICD-10-CM | POA: Insufficient documentation

## 2020-09-19 DIAGNOSIS — Z7409 Other reduced mobility: Secondary | ICD-10-CM | POA: Insufficient documentation

## 2020-09-19 LAB — CBC WITH DIFFERENTIAL/PLATELET
Abs Immature Granulocytes: 0.01 10*3/uL (ref 0.00–0.07)
Basophils Absolute: 0 10*3/uL (ref 0.0–0.1)
Basophils Relative: 1 %
Eosinophils Absolute: 0.1 10*3/uL (ref 0.0–0.5)
Eosinophils Relative: 1 %
HCT: 35.5 % — ABNORMAL LOW (ref 36.0–46.0)
Hemoglobin: 11.2 g/dL — ABNORMAL LOW (ref 12.0–15.0)
Immature Granulocytes: 0 %
Lymphocytes Relative: 22 %
Lymphs Abs: 1.2 10*3/uL (ref 0.7–4.0)
MCH: 28.1 pg (ref 26.0–34.0)
MCHC: 31.5 g/dL (ref 30.0–36.0)
MCV: 89 fL (ref 80.0–100.0)
Monocytes Absolute: 0.4 10*3/uL (ref 0.1–1.0)
Monocytes Relative: 8 %
Neutro Abs: 3.9 10*3/uL (ref 1.7–7.7)
Neutrophils Relative %: 68 %
Platelets: 176 10*3/uL (ref 150–400)
RBC: 3.99 MIL/uL (ref 3.87–5.11)
RDW: 13.6 % (ref 11.5–15.5)
WBC: 5.7 10*3/uL (ref 4.0–10.5)
nRBC: 0 % (ref 0.0–0.2)

## 2020-09-19 LAB — COMPREHENSIVE METABOLIC PANEL
ALT: 13 U/L (ref 0–44)
AST: 16 U/L (ref 15–41)
Albumin: 3.6 g/dL (ref 3.5–5.0)
Alkaline Phosphatase: 62 U/L (ref 38–126)
Anion gap: 7 (ref 5–15)
BUN: 11 mg/dL (ref 6–20)
CO2: 30 mmol/L (ref 22–32)
Calcium: 10.3 mg/dL (ref 8.9–10.3)
Chloride: 102 mmol/L (ref 98–111)
Creatinine, Ser: 0.87 mg/dL (ref 0.44–1.00)
GFR, Estimated: >60 mL/min (ref >60)
Glucose, Bld: 104 mg/dL — ABNORMAL HIGH (ref 70–99)
Potassium: 4.2 mmol/L (ref 3.5–5.1)
Sodium: 139 mmol/L (ref 135–145)
Total Bilirubin: 0.4 mg/dL (ref 0.3–1.2)
Total Protein: 6.6 g/dL (ref 6.5–8.1)

## 2020-09-19 LAB — TROPONIN I (HIGH SENSITIVITY)
Troponin I (High Sensitivity): 7 ng/L (ref <18)
Troponin I (High Sensitivity): 7 ng/L (ref <18)

## 2020-09-19 MED ORDER — HYDRALAZINE HCL 25 MG PO TABS
50.0000 mg | ORAL_TABLET | Freq: Once | ORAL | Status: AC
  Administered 2020-09-19: 50 mg via ORAL
  Filled 2020-09-19: qty 2

## 2020-09-19 NOTE — ED Provider Notes (Signed)
[facility] EMERGENCY DEPARTMENT Provider Note   CSN: [id number] Arrival date & time: 09/19/20  0913     History Chief Complaint  Patient presents with  . Hypertension    Haley Hernandez is a 60 y.o. female-past medical history of COPD, diabetes, hypertension, hemorrhagic stroke with residual left-sided weakness that presents to the emergency department today sent from PCP for hypertensive emergency.  Patient states that she went for general checkup with her PCP this morning, spoke to PCP NP Glennon Hamilton who states she is currently sent due to her blood pressure and her previous history of CVA.  Patient does admit to a headache that is mild, does also mention that she is having some pain from her left shoulder down with some burning down into her arm.  States this is intermittent and occurs often.  Denies any other complaints.  No chest pain or shortness of breath.  No back pain or abdominal pain.  No nausea or vomiting.  No blurry vision, neck pain, new weakness.  Patient denies any falls onto her left shoulder.  Denies any jaw pain.  Denies any prior history of MI.  Denies any dysuria or hematuria.  States that she has not been checking her blood pressure at home, however she has been to multiple office appointments for her blood pressure has been normal.  Patient admits to intermittent headache that comes and goes, however did begin this morning.  Is all over, does not radiate anywhere.  No vision changes.  Denies any neck pain.  States that she has been compliant with all of her blood pressure medications which include amlodipine, clonidine, hydralazine.  HPI     Past Medical History:  Diagnosis Date  . COPD 06/27/2008   Qualifier: Diagnosis of  By: Craige Cotta MD, [place]    . Diabetes mellitus without complication (HCC)   . Hypertension   . IVH (intraventricular hemorrhage) (HCC) 07/18/2019  . OBSTRUCTIVE SLEEP APNEA 06/27/2008   Qualifier: Diagnosis of  By: Craige Cotta MD, [place]    . Stroke [facility])      Patient Active Problem List   Diagnosis Date Noted  . Rectal bleeding 02/25/2020  . History of CVA (cerebrovascular accident) 02/25/2020  . Oral thrush 10/10/2019  . Hypercalcemia 09/27/2019  . Normochromic normocytic anemia 09/27/2019  . Diarrhea 09/27/2019  . Diplopia 09/27/2019  . Dysphagia due to recent cerebrovascular accident (CVA) 09/22/2019  . GERD without esophagitis 09/22/2019  . 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 Hypertension associated with type 2 diabetes mellitus (HCC) 09/22/2019  . Chronic constipation 09/22/2019  . Major depression, recurrent, chronic (HCC) 09/22/2019  . 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 Status post tracheostomy (HCC)   . Tongue swelling   . Palliative care by specialist   . Goals of care, counseling/discussion   . Full code status   . Acute respiratory failure with hypoxemia (HCC) 07/23/2019  . Hypokalemia 07/23/2019  . Hypophosphatemia 07/23/2019  . IVH (intraventricular hemorrhage) (HCC) 07/18/2019  . OBSTRUCTIVE SLEEP APNEA 06/27/2008    Past Surgical History:  Procedure Laterality Date  . ABDOMINAL HYSTERECTOMY    . BIOPSY  02/28/2020   Procedure: BIOPSY;  Surgeon: Charlott Rakes, MD;  Location: [facility] ENDOSCOPY;  Service: Endoscopy;;  . COLONOSCOPY N/A 02/28/2020   Procedure: COLONOSCOPY;  Surgeon: Charlott Rakes, MD;  Location: [facility] ENDOSCOPY;  Service: Endoscopy;  Laterality: N/A;  . IR GASTROSTOMY TUBE MOD SED  08/03/2019  .  IR GASTROSTOMY TUBE REMOVAL  11/21/2019     OB History   No obstetric history on file.     Family History  Problem Relation Age of Onset  . Hypertension Father   . Diabetes Father     Social History   Tobacco Use  . Smoking status: Never Smoker  . Smokeless tobacco: Never Used  Substance Use Topics  . Alcohol use: Not Currently  . Drug use: Not Currently    Home Medications Prior to  Admission medications   Medication Sig Start Date End Date Taking? Authorizing Provider  amLODipine (NORVASC) 2.5 MG tablet Take 5 mg by mouth daily.   Yes [provider]  Baclofen 5 MG TABS Take 5 mg by mouth in the morning AND 10 mg at bedtime. 08/01/20  Yes McCue, Shanda Bumps, NP  busPIRone (BUSPAR) 5 MG tablet Take 1 tablet (5 mg total) by mouth every 12 (twelve) hours. 10/19/19  Yes Sharee Holster, NP  chlorhexidine (PERIDEX) 0.12 % solution 15 mL by Mouth route Two (2) times a day. Rinse and spit out excess. 09/03/20  Yes [provider]  Cholecalciferol (D3-1000) 25 MCG (1000 UT) tablet Take 1,000 Units by mouth daily.   Yes [provider]  cloNIDine (CATAPRES) 0.1 MG tablet Take 1 tablet (0.1 mg total) by mouth 2 (two) times daily. 03/01/20 03/31/20 Yes Darlin Drop, DO  DULoxetine (CYMBALTA) 60 MG capsule Take 1 capsule (60 mg total) by mouth daily. 08/01/20  Yes McCue, Shanda Bumps, NP  gabapentin (NEURONTIN) 100 MG capsule Take 100 mg by mouth 3 (three) times daily.   Yes [provider]  rosuvastatin (CRESTOR) 10 MG tablet Take 1 tablet (10 mg total) by mouth daily. 04/03/20  Yes Jake Bathe, MD  albuterol (ACCUNEB) 1.25 MG/3ML nebulizer solution Take 1 ampule by nebulization every 6 (six) hours as needed for wheezing. Patient not taking: No sig reported    [provider]  albuterol (VENTOLIN HFA) 108 (90 Base) MCG/ACT inhaler Inhale 2 puffs into the lungs every 6 (six) hours as needed for wheezing or shortness of breath. Patient not taking: No sig reported    [provider]  docusate sodium (COLACE) 100 MG capsule Take 100 mg by mouth daily. Patient not taking: No sig reported    [provider]  hydrALAZINE (APRESOLINE) 100 MG tablet Take 1 tablet (100 mg total) by mouth 2 (two) times daily. 03/01/20 03/31/20  Darlin Drop, DO    Allergies    Losartan, Norvasc [amlodipine], Ace inhibitors, and Penicillins  Review of  Systems   Review of Systems  Constitutional: Negative for chills, diaphoresis, fatigue and fever.  HENT: Negative for congestion, sore throat and trouble swallowing.   Eyes: Negative for pain and visual disturbance.  Respiratory: Negative for cough, shortness of breath and wheezing.   Cardiovascular: Negative for chest pain, palpitations and leg swelling.  Gastrointestinal: Negative for abdominal distention, abdominal pain, diarrhea, nausea and vomiting.  Genitourinary: Negative for difficulty urinating.  Musculoskeletal: Positive for arthralgias. Negative for back pain, neck pain and neck stiffness.  Skin: Negative for pallor.  Neurological: Positive for headaches. Negative for dizziness, speech difficulty and weakness.  Psychiatric/Behavioral: Negative for confusion.    Physical Exam Updated Vital Signs BP (!) 159/84   Pulse (!) 52   Temp 98.3 F (36.8 C) (Oral)   Resp (!) 9   Ht 5\' 1"  (1.549 m)   Wt 68 kg   SpO2 100%   BMI 28.34 kg/m  Physical Exam Constitutional:      General: She is not in acute distress.    Appearance: Normal appearance. She is not ill-appearing, toxic-appearing or diaphoretic.  HENT:     Mouth/Throat:     Mouth: Mucous membranes are moist.     Pharynx: Oropharynx is clear.  Eyes:     General: No scleral icterus.    Extraocular Movements: Extraocular movements intact.     Pupils: Pupils are equal, round, and reactive to light.  Cardiovascular:     Rate and Rhythm: Normal rate and regular rhythm.     Pulses: Normal pulses.     Heart sounds: Normal heart sounds.  Pulmonary:     Effort: Pulmonary effort is normal. No respiratory distress.     Breath sounds: Normal breath sounds. No stridor. No wheezing, rhonchi or rales.  Chest:     Chest wall: No tenderness.  Abdominal:     General: Abdomen is flat. There is no distension.     Palpations: Abdomen is soft.     Tenderness: There is no abdominal tenderness. There is no guarding or rebound.   Musculoskeletal:        General: No swelling or tenderness. Normal range of motion.     Cervical back: Normal range of motion and neck supple. No rigidity.     Right lower leg: No edema.     Left lower leg: No edema.     Comments: Left upper extremity without tenderness, normal passive range of motion.  Patient is unable to use left upper arm due to stroke.  Radial pulse 2+  Skin:    General: Skin is warm and dry.     Capillary Refill: Capillary refill takes less than 2 seconds.     Coloration: Skin is not pale.  Neurological:     Mental Status: She is alert. Mental status is at baseline.     Comments: Alert. Clear speech. No facial droop. CNIII-XII grossly intact. Bilateral upper and lower extremities' sensation grossly intact.  0 out of 5 strength to left upper extremity, 5 out of 5 strength to right upper extremity.  4-5 strength to left lower, 5 out of 5 strength to right lower.  . Normal finger to nose bilaterally on right. Gait baseline for pt  Psychiatric:        Mood and Affect: Mood normal.        Behavior: Behavior normal.     ED Results / Procedures / Treatments   Labs (all labs ordered are listed, but only abnormal results are displayed) Labs Reviewed  COMPREHENSIVE METABOLIC PANEL - Abnormal; Notable for the following components:      Result Value   Glucose, Bld 104 (*)    All other components within normal limits  CBC WITH DIFFERENTIAL/PLATELET - Abnormal; Notable for the following components:   Hemoglobin 11.2 (*)    HCT 35.5 (*)    All other components within normal limits  TROPONIN I (HIGH SENSITIVITY)  TROPONIN I (HIGH SENSITIVITY)    EKG EKG Interpretation  Date/Time:  Wednesday Sep 19 2020 10:03:45 EDT Ventricular Rate:  58 PR Interval:  198 QRS Duration: 102 QT Interval:  440 QTC Calculation: 433 R Axis:   -27 Text Interpretation: Sinus rhythm Borderline left axis deviation Abnormal R-wave progression, late transition Abnormal T, consider ischemia,  lateral leads Left ventricular hypertrophy with repolarization abnormality No significant change since last tracing Confirmed by Susy Frizzle [phone]) on 09/19/2020 10:06:19 AM   Radiology CT Head Wo  Contrast  Result Date: 09/19/2020 CLINICAL DATA:  Headache, intracranial hemorrhage suspected. Hypertensive emergency. Headache. EXAM: CT HEAD WITHOUT CONTRAST TECHNIQUE: Contiguous axial images were obtained from the base of the skull through the vertex without intravenous contrast. COMPARISON:  Prior head CT examinations 08/08/2019 and earlier. FINDINGS: Brain: Cerebral volume is normal for age. A cavity is present in the region of the right corona radiata/thalamus at site of prior right thalamic hemorrhage. Mild-to-moderate patchy and ill-defined hypoattenuation within the cerebral white matter, nonspecific but compatible with chronic small vessel ischemic disease. As before, there are small calcific/ossific foci within the left frontal lobe deep to a left-sided burr hole. There is no acute intracranial hemorrhage. No demarcated cortical infarct. No extra-axial fluid collection. No evidence of intracranial mass. No midline shift. Vascular: No hyperdense vessel.  Atherosclerotic calcifications. Skull: Redemonstrated left frontoparietal burr hole. No calvarial fracture or focal suspicious osseous lesion. Sinuses/Orbits: Visualized orbits show no acute finding. Trace mucosal thickening and small volume frothy secretions within the left sphenoid sinus. Trace bilateral ethmoid and left maxillary sinus mucosal thickening at the imaged levels. IMPRESSION: No evidence of acute intracranial abnormality. Cavity within the right thalamus and corona radiata at site of prior parenchymal hemorrhage. Stable mild-to-moderate cerebral white matter chronic small vessel ischemic disease. Paranasal sinus disease, as described. Electronically Signed   By: Jackey Loge DO   On: 09/19/2020 10:52    Procedures Procedures    Medications Ordered in ED Medications  hydrALAZINE (APRESOLINE) tablet 50 mg (50 mg Oral Given 09/19/20 1142)    ED Course  I have reviewed the triage vital signs and the nursing notes.  Pertinent labs & imaging results that were available during my care of the patient were reviewed by me and considered in my medical decision making (see chart for details).    MDM Rules/Calculators/A&P                           Patient presents to the emergency department today for hypertensive emergency from PCP.  Patient does have a blood pressure of 187/99, has been compliant with her blood pressure medications.  Patient is complaining of a mild headache and left arm pain that began this morning, however this is not the first time she has had this pain.  Will obtain CT head, is within timeframe.  We will also obtain troponins and basic labs.  Work-up today unremarkable with CBC and CMP without any acute abnormalities.  2 negative flat troponins with EKG showing no significant ST elevations or depressions.  Hydralazine given, blood pressure systolic now 159/84.  Patient states that shoulder pain and tingling have significantly improved, daughter is now in the room who states that the neurologist states she is most likely regaining function to the left side which is why she is constantly having the pain and then tingling into her arm.  No need for chest x-ray at this time, patient is not having chest pain, cough, shortness of breath.  Satting 100% on room air.  Patient to be discharged at this time, do not think that patient is having hypertensive emergency.  Discussed hypertensive management and goals, patient will follow up with PCP.  Strict return precautions given.  Doubt need for further emergent work up at this time. I explained the diagnosis and have given explicit precautions to return to the ER including for any other new or worsening symptoms. The patient understands and accepts the medical plan as  it's been  dictated and I have answered their questions. Discharge instructions concerning home care and prescriptions have been given. The patient is STABLE and is discharged to home in good condition.  I discussed this case with my attending physician who cosigned this note including patient's presenting symptoms, physical exam, and planned diagnostics and interventions. Attending physician stated agreement with plan or made changes to plan which were implemented.   Attending physician assessed patient at bedside.  Final Clinical Impression(s) / ED Diagnoses Final diagnoses:  Hypertension, unspecified type    Rx / DC Orders ED Discharge Orders    None       Farrel Gordonatel, Anna-Marie Coller, PA-C 09/19/20 1339    Pollyann SavoySheldon, Charles B, MD 09/19/20 [phone]

## 2020-09-19 NOTE — ED Notes (Signed)
Pt ambulated to bedside commode with assistance.

## 2020-09-19 NOTE — Discharge Instructions (Signed)
  You were evaluated in the Emergency Department and after careful evaluation, we did not find any emergent condition requiring admission or further testing in the hospital.   Your exam/testing today was overall reassuring.  I want you to continue taking your blood pressure, 3 times a day, you can use the forearm attached and follow-up with your PCP this week.  If your blood pressure is over 180 systolic and you do not have any symptoms that you can take half of the hydralazine, 50 mg.  Please continue to take your blood pressure medications as prescribed.  If you do have any symptoms such as chest pain, shortness of breath, dizziness, numbness or tingling or weakness or any new concerning symptoms please come back to the ER.  Please return to the Emergency Department if you experience any worsening of your condition.  Thank you for allowing [place] to be a part of your care. Please speak to your pharmacist about any new medications prescribed today in regards to side effects or interactions with other medications.

## 2020-09-19 NOTE — ED Triage Notes (Signed)
Pt sent by Dr.hall's office for hypertensive emergency.

## 2020-09-19 NOTE — Telephone Encounter (Signed)
Pt's daughter (on [place] ) has called to inform pt is allergic to amLODipine (NORVASC) 2.5 MG tablet please call

## 2020-09-19 NOTE — Telephone Encounter (Signed)
I called and LMVM for [employer], and relayed that will make note ( I see it is in her allergies- swelling) from 08-22-20. She is to contact her family doctor for any additional medication needed for her Bp management.

## 2020-09-20 ENCOUNTER — Ambulatory Visit (INDEPENDENT_AMBULATORY_CARE_PROVIDER_SITE_OTHER): Payer: 59 | Admitting: Cardiology

## 2020-09-20 ENCOUNTER — Encounter: Payer: Self-pay | Admitting: Cardiology

## 2020-09-20 VITALS — BP 162/86 | HR 68 | Ht 61.0 in | Wt 157.0 lb

## 2020-09-20 DIAGNOSIS — E1159 Type 2 diabetes mellitus with other circulatory complications: Secondary | ICD-10-CM | POA: Diagnosis not present

## 2020-09-20 DIAGNOSIS — I152 Hypertension secondary to endocrine disorders: Secondary | ICD-10-CM | POA: Diagnosis not present

## 2020-09-20 DIAGNOSIS — I2584 Coronary atherosclerosis due to calcified coronary lesion: Secondary | ICD-10-CM | POA: Diagnosis not present

## 2020-09-20 DIAGNOSIS — I2510 Atherosclerotic heart disease of native coronary artery without angina pectoris: Secondary | ICD-10-CM | POA: Diagnosis not present

## 2020-09-20 MED ORDER — HYDROCHLOROTHIAZIDE 25 MG PO TABS: 25.0000 mg | ORAL_TABLET | Freq: Every day | ORAL | 3 refills | Status: DC

## 2020-09-20 NOTE — Progress Notes (Signed)
Cardiology Office Note:    Date:  09/20/2020   ID:  Haley Hernandez, DOB 1960/12/10, MRN [id number]  PCP:  Benita Stabile, MD   [facility] Providers Cardiologist:  Donato Schultz, MD     Referring MD: Benita Stabile, MD     History of Present Illness:    Haley Hernandez is a 60 y.o. female here for follow-up of prior chest discomfort, prior hospitalization 03/02/2020.  She was also in the emergency department on 09/19/2020 yesterday with high blood pressure.  Clonidine patch has been prescribed.  Previously has had stroke with interventricular hemorrhage obstructive sleep apnea.  During the admission she did have 5-6 large bloody stools and colonoscopy showed a focal hyperemia.  No recurrent GI bleed.  Echocardiogram 03/01/2020 showed EF of 65 to 70% with grade 1 diastolic dysfunction.  Cardiac CT performed on 03/05/2020 showed a calcium score of 2.  No significant flow-limiting CAD.  0 to 24% stenosis of LAD.  Borderline dilated aorta 38 mm.  She does have occasional musculoskeletal type tingling in her left upper chest residual from strokelike symptoms.  Overall no shortness of breath.   Past Medical History:  Diagnosis Date  . COPD 06/27/2008   Qualifier: Diagnosis of  By: Craige Cotta MD, Vineet    . Diabetes mellitus without complication (HCC)   . Hypertension   . IVH (intraventricular hemorrhage) (HCC) 07/18/2019  . OBSTRUCTIVE SLEEP APNEA 06/27/2008   Qualifier: Diagnosis of  By: Craige Cotta MD, Vineet    . Stroke [facility])     Past Surgical History:  Procedure Laterality Date  . ABDOMINAL HYSTERECTOMY    . BIOPSY  02/28/2020   Procedure: BIOPSY;  Surgeon: Charlott Rakes, MD;  Location: [facility];  Service: Endoscopy;;  . COLONOSCOPY N/A 02/28/2020   Procedure: COLONOSCOPY;  Surgeon: Charlott Rakes, MD;  Location: [facility];  Service: Endoscopy;  Laterality: N/A;  . IR GASTROSTOMY TUBE MOD SED  08/03/2019  . IR GASTROSTOMY TUBE REMOVAL  11/21/2019    Current  Medications: Current Meds  Medication Sig  . albuterol (ACCUNEB) 1.25 MG/3ML nebulizer solution Take 1 ampule by nebulization every 6 (six) hours as needed for wheezing.  Marland Kitchen albuterol (VENTOLIN HFA) 108 (90 Base) MCG/ACT inhaler Inhale 2 puffs into the lungs every 6 (six) hours as needed for wheezing or shortness of breath.  . Baclofen 5 MG TABS Take 5 mg by mouth in the morning AND 10 mg at bedtime.  . busPIRone (BUSPAR) 5 MG tablet Take 1 tablet (5 mg total) by mouth every 12 (twelve) hours.  . chlorhexidine (PERIDEX) 0.12 % solution 15 mL by Mouth route Two (2) times a day. Rinse and spit out excess.  . Cholecalciferol (D3-1000) 25 MCG (1000 UT) tablet Take 1,000 Units by mouth daily.  . cloNIDine (CATAPRES - DOSED IN MG/24 HR) 0.1 mg/24hr patch Place 0.1 mg onto the skin once a week.  . docusate sodium (COLACE) 100 MG capsule Take 100 mg by mouth daily.  . DULoxetine (CYMBALTA) 60 MG capsule Take 1 capsule (60 mg total) by mouth daily.  Marland Kitchen gabapentin (NEURONTIN) 100 MG capsule Take 100 mg by mouth 3 (three) times daily.  . hydrochlorothiazide (HYDRODIURIL) 25 MG tablet Take 1 tablet (25 mg total) by mouth daily.  . rosuvastatin (CRESTOR) 10 MG tablet Take 1 tablet (10 mg total) by mouth daily.     Allergies:   Losartan, Norvasc [amlodipine], Ace inhibitors, and Penicillins   Social History   Socioeconomic History  . Marital status: Single  Spouse name: Not on file  . Number of children: Not on file  . Years of education: Not on file  . Highest education level: Not on file  Occupational History  . Not on file  Tobacco Use  . Smoking status: Never Smoker  . Smokeless tobacco: Never Used  Substance and Sexual Activity  . Alcohol use: Not Currently  . Drug use: Not Currently  . Sexual activity: Not on file  Other Topics Concern  . Not on file  Social History Narrative  . Not on file   Social Determinants of Health   Financial Resource Strain: Not on file  Food Insecurity:  Not on file  Transportation Needs: Not on file  Physical Activity: Not on file  Stress: Not on file  Social Connections: Not on file     Family History: The patient's family history includes Diabetes in her father; Hypertension in her father.  ROS:   Please see the history of present illness.     All other systems reviewed and are negative.  EKGs/Labs/Other Studies Reviewed:    The following studies were reviewed today: As above  EKG: Sinus rhythm 58 with left axis deviation.  Personally reviewed and interpreted from ER yesterday.  Recent Labs: 03/01/2020: Magnesium 2.3 09/19/2020: ALT 13; BUN 11; Creatinine, Ser 0.87; Hemoglobin 11.2; Platelets 176; Potassium 4.2; Sodium 139  Recent Lipid Panel    Component Value Date/Time   CHOL 180 07/20/2019 0556   TRIG 201 (H) 07/30/2019 0637   HDL 45 07/20/2019 0556   CHOLHDL 4.0 07/20/2019 0556   VLDL 72 (H) 07/20/2019 0556   LDLCALC 63 07/20/2019 0556     Risk Assessment/Calculations:      Physical Exam:    VS:  BP (!) 162/86   Pulse 68   Ht 5\' 1"  (1.549 m)   Wt 157 lb (71.2 kg)   SpO2 97%   BMI 29.66 kg/m     Wt Readings from Last 3 Encounters:  09/20/20 157 lb (71.2 kg)  09/19/20 150 lb (68 kg)  08/01/20 147 lb (66.7 kg)     GEN: In wheelchair, left arm supported, well nourished, well developed in no acute distress HEENT: Normal NECK: No JVD; No carotid bruits LYMPHATICS: No lymphadenopathy CARDIAC: RRR, no murmurs, rubs, gallops RESPIRATORY:  Clear to auscultation without rales, wheezing or rhonchi  ABDOMEN: Soft, non-tender, non-distended MUSCULOSKELETAL:  No edema; No deformity  SKIN: Warm and dry NEUROLOGIC:  Alert and oriented x 3 PSYCHIATRIC:  Normal affect   ASSESSMENT:    1. Coronary artery calcification   2. Hypertension associated with type 2 diabetes mellitus (HCC)    PLAN:    In order of problems listed above:  Difficulty control hypertension - Agree with clonidine patch.  Does not  need to take pills if she is taking the patch. -She has an allergy to both amlodipine as well as ACE inhibitor's with swelling. - We will start HCTZ 25 mg once a day.  Labs from yesterday showed sodium of 139 potassium 4.2 creatinine 0.87. -Dr. 08/03/20 has been monitoring.  Her daughter has an electronic blood pressure cuff.  Nonobstructive coronary artery disease - Minor plaque in LAD noted on coronary CT scan.  Reassurance.  Continue with Crestor, blood pressure control.  LDL 37.  ALT 9.  Refill medications as needed.       Medication Adjustments/Labs and Tests Ordered: Current medicines are reviewed at length with the patient today.  Concerns regarding medicines are outlined above.  No orders of the defined types were placed in this encounter.  Meds ordered this encounter  Medications  . hydrochlorothiazide (HYDRODIURIL) 25 MG tablet    Sig: Take 1 tablet (25 mg total) by mouth daily.    Dispense:  90 tablet    Refill:  3    Patient Instructions  Medication Instructions:  1) START Hydrochlorothiazide (HCTZ) 25mg  once daily  *If you need a refill on your cardiac medications before your next appointment, please call your pharmacy*   Lab Work: None If you have labs (blood work) drawn today and your tests are completely normal, you will receive your results only by: MyChart Message (if you have MyChart) OR . A paper copy in the mail If you have any lab test that is abnormal or we need to change your treatment, we will call you to review the results.   Testing/Procedures: None   Follow-Up: At [facility], you and your health needs are our priority.  As part of our continuing mission to provide you with exceptional heart care, we have created designated Provider Care Teams.  These Care Teams include your primary Cardiologist (physician) and Advanced Practice Providers (APPs -  Physician Assistants and Nurse Practitioners) who all work together to provide you with the care  you need, when you need it.  We recommend signing up for the patient portal called "MyChart".  Sign up information is provided on this After Visit Summary.  MyChart is used to connect with patients for Virtual Visits (Telemedicine).  Patients are able to view lab/test results, encounter notes, upcoming appointments, etc.  Non-urgent messages can be sent to your provider as well.   To learn more about what you can do with MyChart, go to [facility].    Your next appointment:   1 year(s)  The format for your next appointment:   In Person  Provider:   You may see [email or web address], MD or one of the following Advanced Practice Providers on your designated Care Team:    Donato Schultz, NP    Other Instructions      Signed, Georgie Chard, MD  09/20/2020 10:38 AM    [facility]

## 2020-09-20 NOTE — Patient Instructions (Signed)
Medication Instructions:  1) START Hydrochlorothiazide (HCTZ) 25mg  once daily  *If you need a refill on your cardiac medications before your next appointment, please call your pharmacy*   Lab Work: None If you have labs (blood work) drawn today and your tests are completely normal, you will receive your results only by: MyChart Message (if you have MyChart) OR . A paper copy in the mail If you have any lab test that is abnormal or we need to change your treatment, we will call you to review the results.   Testing/Procedures: None   Follow-Up: At [facility], you and your health needs are our priority.  As part of our continuing mission to provide you with exceptional heart care, we have created designated Provider Care Teams.  These Care Teams include your primary Cardiologist (physician) and Advanced Practice Providers (APPs -  Physician Assistants and Nurse Practitioners) who all work together to provide you with the care you need, when you need it.  We recommend signing up for the patient portal called "MyChart".  Sign up information is provided on this After Visit Summary.  MyChart is used to connect with patients for Virtual Visits (Telemedicine).  Patients are able to view lab/test results, encounter notes, upcoming appointments, etc.  Non-urgent messages can be sent to your provider as well.   To learn more about what you can do with MyChart, go to [facility].    Your next appointment:   1 year(s)  The format for your next appointment:   In Person  Provider:   You may see [email or web address], MD or one of the following Advanced Practice Providers on your designated Care Team:    Donato Schultz, NP    Other Instructions

## 2020-10-25 ENCOUNTER — Emergency Department (HOSPITAL_COMMUNITY): Payer: 59

## 2020-10-25 ENCOUNTER — Other Ambulatory Visit: Payer: Self-pay

## 2020-10-25 ENCOUNTER — Emergency Department (HOSPITAL_COMMUNITY)
Admission: EM | Admit: 2020-10-25 | Discharge: 2020-10-26 | Disposition: A | Discharge status: Home / Self Care | Payer: 59 | Attending: Emergency Medicine | Admitting: Emergency Medicine

## 2020-10-25 ENCOUNTER — Encounter (HOSPITAL_COMMUNITY): Payer: Self-pay | Admitting: Emergency Medicine

## 2020-10-25 DIAGNOSIS — I10 Essential (primary) hypertension: Secondary | ICD-10-CM | POA: Insufficient documentation

## 2020-10-25 DIAGNOSIS — W19XXXA Unspecified fall, initial encounter: Secondary | ICD-10-CM

## 2020-10-25 DIAGNOSIS — J449 Chronic obstructive pulmonary disease, unspecified: Secondary | ICD-10-CM | POA: Insufficient documentation

## 2020-10-25 DIAGNOSIS — E119 Type 2 diabetes mellitus without complications: Secondary | ICD-10-CM | POA: Insufficient documentation

## 2020-10-25 DIAGNOSIS — Y9289 Other specified places as the place of occurrence of the external cause: Secondary | ICD-10-CM | POA: Insufficient documentation

## 2020-10-25 DIAGNOSIS — Z23 Encounter for immunization: Secondary | ICD-10-CM | POA: Insufficient documentation

## 2020-10-25 DIAGNOSIS — Y9301 Activity, walking, marching and hiking: Secondary | ICD-10-CM | POA: Insufficient documentation

## 2020-10-25 DIAGNOSIS — R062 Wheezing: Secondary | ICD-10-CM | POA: Diagnosis not present

## 2020-10-25 DIAGNOSIS — S0101XA Laceration without foreign body of scalp, initial encounter: Secondary | ICD-10-CM | POA: Insufficient documentation

## 2020-10-25 DIAGNOSIS — W01198A Fall on same level from slipping, tripping and stumbling with subsequent striking against other object, initial encounter: Secondary | ICD-10-CM | POA: Diagnosis not present

## 2020-10-25 DIAGNOSIS — S0990XA Unspecified injury of head, initial encounter: Secondary | ICD-10-CM

## 2020-10-25 DIAGNOSIS — Z79899 Other long term (current) drug therapy: Secondary | ICD-10-CM | POA: Diagnosis not present

## 2020-10-25 MED ORDER — LIDOCAINE-EPINEPHRINE (PF) 2 %-1:200000 IJ SOLN
10.0000 mL | Freq: Once | INTRAMUSCULAR | Status: AC
  Administered 2020-10-26: 10 mL
  Filled 2020-10-25: qty 20

## 2020-10-25 NOTE — ED Triage Notes (Signed)
Patient arrives via EMS s/p fall with injury to the back of the head. Patient was walking into her room and lost her balance and fell due to left sided deficits from a stroke. Patient denies LOC, no thinners. Full ROM, c-spine clear. Patient noted to be hypertensive, has not had night time medications.

## 2020-10-25 NOTE — ED Provider Notes (Signed)
Emergency Medicine Provider Triage Evaluation Note  Haley Hernandez , a 60 y.o. female  was evaluated in triage.  Pt complains of head lac from fall. Patient was getting ready for bed when she fell (left side weakness from prior CVA causes her to be unsteady). Hit head on night stand, no LOC, not on thinners. No other injuries.  Review of Systems  Positive: Head lac Negative: loc  Physical Exam  BP (!) 190/100 (BP Location: Left Arm)   Pulse 62   Temp (!) 97.5 F (36.4 C) (Oral)   Resp 18   SpO2 96%  Gen:   Awake, no distress   Resp:  Normal effort  MSK:   Moves extremities without difficulty  Other:  Lac to posterior scalp, no active bleeding  Medical Decision Making  Medically screening exam initiated at 11:28 PM.  Appropriate orders placed.  Tora Prunty was informed that the remainder of the evaluation will be completed by another provider, this initial triage assessment does not replace that evaluation, and the importance of remaining in the ED until their evaluation is complete.     Jeannie Fend, PA-C 10/25/20 2329    Lorre Nick, MD 10/27/20 [phone]

## 2020-10-25 NOTE — ED Notes (Signed)
Patient transported to CT 

## 2020-10-26 MED ORDER — TETANUS-DIPHTH-ACELL PERTUSSIS 5-2.5-18.5 LF-MCG/0.5 IM SUSY
0.5000 mL | PREFILLED_SYRINGE | Freq: Once | INTRAMUSCULAR | Status: AC
  Administered 2020-10-26: 0.5 mL via INTRAMUSCULAR
  Filled 2020-10-26: qty 0.5

## 2020-10-26 MED ORDER — ACETAMINOPHEN 325 MG PO TABS
650.0000 mg | ORAL_TABLET | Freq: Once | ORAL | Status: AC
  Administered 2020-10-26: 650 mg via ORAL
  Filled 2020-10-26: qty 2

## 2020-10-26 NOTE — Discharge Instructions (Addendum)
Your head CT is negative for any evidence of significant head injury. The staples placed to repair the laceration will need to be removed in 10 days. Tylenol for pain as needed for comfort.

## 2020-10-26 NOTE — ED Provider Notes (Signed)
[facility]-EMERGENCY DEPT Provider Note   CSN: [id number] Arrival date & time: 10/25/20  2309     History Chief Complaint  Patient presents with   Fall   Head Injury    Haley Hernandez is a 60 y.o. female.  Patient with history of CVA, persistent left sided weakness, HTN, T2DM, COPD, OSA to ED after fall tonight secondary to left sided weakness and losing her balance. No LOC. She hit her head on a nightstand causing a laceration to the back of her head. No neck pain. She denies other injury. Here with daughter who is caregiver and was present when she fell. No nausea, vomiting. No confusion.   The history is provided by the patient. No language interpreter was used.  Fall Associated symptoms include headaches. Pertinent negatives include no chest pain.  Head Injury Associated symptoms: headache   Associated symptoms: no nausea, no neck pain and no vomiting       Past Medical History:  Diagnosis Date   COPD 06/27/2008   Qualifier: Diagnosis of  By: Craige Cotta MD, Vineet     Diabetes mellitus without complication (HCC)    Hypertension    IVH (intraventricular hemorrhage) (HCC) 07/18/2019   OBSTRUCTIVE SLEEP APNEA 06/27/2008   Qualifier: Diagnosis of  By: Craige Cotta MD, Vineet     Stroke [facility])     Patient Active Problem List   Diagnosis Date Noted   Rectal bleeding 02/25/2020   History of CVA (cerebrovascular accident) 02/25/2020   Oral thrush 10/10/2019   Hypercalcemia 09/27/2019   Normochromic normocytic anemia 09/27/2019   Diarrhea 09/27/2019   Diplopia 09/27/2019   Dysphagia due to recent cerebrovascular accident (CVA) 09/22/2019   GERD without esophagitis 09/22/2019   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   Hypertension associated with type 2 diabetes mellitus (HCC) 09/22/2019   Chronic constipation 09/22/2019   Major depression, recurrent, chronic (HCC) 09/22/2019   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) 09/22/2019   Status post tracheostomy (HCC)    Tongue swelling    Palliative care by specialist    Goals of care, counseling/discussion    Full code status    Acute respiratory failure with hypoxemia (HCC) 07/23/2019   Hypokalemia 07/23/2019   Hypophosphatemia 07/23/2019   IVH (intraventricular hemorrhage) (HCC) 07/18/2019   OBSTRUCTIVE SLEEP APNEA 06/27/2008    Past Surgical History:  Procedure Laterality Date   ABDOMINAL HYSTERECTOMY     BIOPSY  02/28/2020   Procedure: BIOPSY;  Surgeon: Charlott Rakes, MD;  Location: [facility];  Service: Endoscopy;;   COLONOSCOPY N/A 02/28/2020   Procedure: COLONOSCOPY;  Surgeon: Charlott Rakes, MD;  Location: [facility];  Service: Endoscopy;  Laterality: N/A;   IR GASTROSTOMY TUBE MOD SED  08/03/2019   IR GASTROSTOMY TUBE REMOVAL  11/21/2019     OB History   No obstetric history on file.     Family History  Problem Relation Age of Onset   Hypertension Father    Diabetes Father     Social History   Tobacco Use   Smoking status: Never   Smokeless tobacco: Never  Substance Use Topics   Alcohol use: Not Currently   Drug use: Not Currently    Home Medications Prior to Admission medications   Medication Sig Start Date End Date Taking? Authorizing Provider  albuterol (ACCUNEB) 1.25 MG/3ML nebulizer solution Take 1 ampule by nebulization every 6 (six) hours as needed for wheezing.    [provider]  albuterol (VENTOLIN HFA) 108 (90 Base) MCG/ACT inhaler Inhale 2 puffs into the lungs every 6 (six) hours as needed for wheezing or shortness of breath.    [provider]  Baclofen 5 MG TABS Take 5 mg by mouth in the morning AND 10 mg at bedtime. 08/01/20   Ihor Austin, NP  busPIRone (BUSPAR) 5 MG tablet Take 1 tablet (5 mg total) by mouth every 12 (twelve) hours. 10/19/19   Sharee Holster, NP  chlorhexidine (PERIDEX) 0.12 % solution 15 mL by Mouth route Two (2) times a day.  Rinse and spit out excess. 09/03/20   [provider]  Cholecalciferol (D3-1000) 25 MCG (1000 UT) tablet Take 1,000 Units by mouth daily.    [provider]  cloNIDine (CATAPRES - DOSED IN MG/24 HR) 0.1 mg/24hr patch Place 0.1 mg onto the skin once a week.    [provider]  docusate sodium (COLACE) 100 MG capsule Take 100 mg by mouth daily.    [provider]  DULoxetine (CYMBALTA) 60 MG capsule Take 1 capsule (60 mg total) by mouth daily. 08/01/20   Ihor Austin, NP  gabapentin (NEURONTIN) 100 MG capsule Take 100 mg by mouth 3 (three) times daily.    [provider]  hydrALAZINE (APRESOLINE) 100 MG tablet Take 1 tablet (100 mg total) by mouth 2 (two) times daily. 03/01/20 03/31/20  Darlin Drop, DO  hydrochlorothiazide (HYDRODIURIL) 25 MG tablet Take 1 tablet (25 mg total) by mouth daily. 09/20/20   Jake Bathe, MD  rosuvastatin (CRESTOR) 10 MG tablet Take 1 tablet (10 mg total) by mouth daily. 04/03/20   Jake Bathe, MD    Allergies    Losartan, Norvasc [amlodipine], Ace inhibitors, and Penicillins  Review of Systems   Review of Systems  Constitutional:  Negative for diaphoresis.  Cardiovascular:  Negative for chest pain.  Gastrointestinal:  Negative for nausea and vomiting.  Musculoskeletal:  Negative for back pain and neck pain.  Skin:  Positive for wound.  Neurological:  Positive for headaches. Negative for syncope.   Physical Exam Updated Vital Signs BP (!) 160/105   Pulse 61   Temp 98.2 F (36.8 C) (Oral)   Resp 15   SpO2 100%   Physical Exam Vitals and nursing note reviewed.  Constitutional:      Appearance: She is well-developed.  HENT:     Head: Normocephalic.  Cardiovascular:     Rate and Rhythm: Normal rate.  Pulmonary:     Effort: Pulmonary effort is normal. No respiratory distress.     Breath sounds: Wheezing and rhonchi present.  Abdominal:     Tenderness: There is no abdominal tenderness.   Musculoskeletal:        General: Normal range of motion.     Cervical back: Normal range of motion and neck supple.     Comments: Contracture of left hand. No extremity injury visualized. No midline cervical tenderness.   Skin:    General: Skin is warm and dry.     Comments: 3 cm laceration occipital scalp.   Neurological:     General: No focal deficit present.     Mental Status: She is alert and oriented to person, place, and time.    ED Results / Procedures / Treatments   Labs (all labs ordered are listed, but only abnormal results are displayed) Labs Reviewed - No data to display  EKG None  Radiology CT Head Wo Contrast  Result Date: 10/26/2020 CLINICAL  DATA:  Head trauma. EXAM: CT HEAD WITHOUT CONTRAST TECHNIQUE: Contiguous axial images were obtained from the base of the skull through the vertex without intravenous contrast. COMPARISON:  CT head 09/19/2020 FINDINGS: Brain: Patchy and confluent areas of decreased attenuation are noted throughout the deep and periventricular white matter of the cerebral hemispheres bilaterally, compatible with chronic microvascular ischemic disease. Similar-appearing encephalomalacia of the right thalamus due to prior infarction. No evidence of large-territorial acute infarction. No parenchymal hemorrhage. No mass lesion. No extra-axial collection. No mass effect or midline shift. No hydrocephalus. Basilar cisterns are patent. Vascular: No hyperdense vessel. Skull: No acute fracture or focal lesion. Sinuses/Orbits: Paranasal sinuses and mastoid air cells are clear. The orbits are unremarkable. Other: None. IMPRESSION: No acute intracranial abnormality. Electronically Signed   By: Tish Frederickson M.D.   On: 10/26/2020 00:31    Procedures .Marland KitchenLaceration Repair  Date/Time: 10/26/2020 4:55 AM Performed by: Elpidio Anis, PA-C Authorized by: Elpidio Anis, PA-C   Consent:    Consent obtained:  Verbal   Consent given by:  Patient Universal protocol:     Procedure explained and questions answered to patient or proxy's satisfaction: yes     Immediately prior to procedure, a time out was called: yes     Patient identity confirmed:  Verbally with patient Anesthesia:    Anesthesia method:  Local infiltration   Local anesthetic:  Lidocaine 2% WITH epi Laceration details:    Location:  Scalp   Scalp location:  Occipital   Length (cm):  3 Treatment:    Area cleansed with:  Povidone-iodine and saline   Amount of cleaning:  Standard Skin repair:    Repair method:  Staples   Number of staples:  4 Approximation:    Approximation:  Close Repair type:    Repair type:  Simple Post-procedure details:    Procedure completion:  Tolerated well, no immediate complications   Medications Ordered in ED Medications  acetaminophen (TYLENOL) tablet 650 mg (has no administration in time range)  lidocaine-EPINEPHrine (XYLOCAINE W/EPI) 2 %-1:200000 (PF) injection 10 mL (10 mLs Infiltration Given by Other 10/26/20 0335)    ED Course  I have reviewed the triage vital signs and the nursing notes.  Pertinent labs & imaging results that were available during my care of the patient were reviewed by me and considered in my medical decision making (see chart for details).    MDM Rules/Calculators/A&P                          Patient to ED after fall at home causing scalp laceration. No LOC, vomiting.   Patient at baseline function, mental status per daughter at bedside. Laceration repaired as per above note. Head CT negative for acute injury. Tetanus updated.   Care instructions provided. Patient is stable for discharge home.   Final Clinical Impression(s) / ED Diagnoses Final diagnoses:  None   Fall Scalp laceration  Rx / DC Orders ED Discharge Orders     None        Elpidio Anis, PA-C 10/26/20 0457    Gilda Crease, MD 10/26/20 [phone]

## 2020-10-26 NOTE — ED Notes (Signed)
Stapler, suture kit, suture cart at bedside.

## 2020-11-13 DIAGNOSIS — G8194 Hemiplegia, unspecified affecting left nondominant side: Secondary | ICD-10-CM | POA: Insufficient documentation

## 2020-11-13 DIAGNOSIS — F411 Generalized anxiety disorder: Secondary | ICD-10-CM | POA: Insufficient documentation

## 2020-11-13 DIAGNOSIS — I639 Cerebral infarction, unspecified: Secondary | ICD-10-CM | POA: Insufficient documentation

## 2020-11-16 ENCOUNTER — Other Ambulatory Visit (HOSPITAL_COMMUNITY): Payer: Self-pay | Admitting: Family Medicine

## 2020-11-20 ENCOUNTER — Other Ambulatory Visit: Payer: Self-pay | Admitting: Cardiology

## 2020-11-24 ENCOUNTER — Ambulatory Visit: Payer: 59

## 2020-11-24 ENCOUNTER — Ambulatory Visit
Admission: RE | Admit: 2020-11-24 | Discharge: 2020-11-24 | Disposition: A | Discharge status: Home / Self Care | Payer: 59 | Source: Ambulatory Visit | Attending: Internal Medicine | Admitting: Internal Medicine

## 2020-11-24 DIAGNOSIS — Z1231 Encounter for screening mammogram for malignant neoplasm of breast: Secondary | ICD-10-CM

## 2020-12-26 ENCOUNTER — Other Ambulatory Visit: Payer: 59

## 2021-01-10 ENCOUNTER — Other Ambulatory Visit: Payer: 59

## 2021-01-17 ENCOUNTER — Other Ambulatory Visit: Payer: Self-pay

## 2021-01-17 ENCOUNTER — Ambulatory Visit
Admission: RE | Admit: 2021-01-17 | Discharge: 2021-01-17 | Disposition: A | Discharge status: Home / Self Care | Payer: 59 | Source: Ambulatory Visit | Attending: Internal Medicine | Admitting: Internal Medicine

## 2021-01-17 DIAGNOSIS — Z13820 Encounter for screening for osteoporosis: Secondary | ICD-10-CM

## 2021-01-28 ENCOUNTER — Encounter: Payer: Self-pay | Admitting: Adult Health

## 2021-01-28 ENCOUNTER — Ambulatory Visit (INDEPENDENT_AMBULATORY_CARE_PROVIDER_SITE_OTHER): Payer: 59 | Admitting: Adult Health

## 2021-01-28 VITALS — BP 142/84 | HR 51

## 2021-01-28 DIAGNOSIS — G8114 Spastic hemiplegia affecting left nondominant side: Secondary | ICD-10-CM | POA: Diagnosis not present

## 2021-01-28 DIAGNOSIS — M792 Neuralgia and neuritis, unspecified: Secondary | ICD-10-CM

## 2021-01-28 DIAGNOSIS — I612 Nontraumatic intracerebral hemorrhage in hemisphere, unspecified: Secondary | ICD-10-CM

## 2021-01-28 DIAGNOSIS — I69398 Other sequelae of cerebral infarction: Secondary | ICD-10-CM

## 2021-01-28 MED ORDER — GABAPENTIN 300 MG PO CAPS: 300.0000 mg | ORAL_CAPSULE | Freq: Three times a day (TID) | ORAL | 5 refills | Status: DC

## 2021-01-28 MED ORDER — BACLOFEN 5 MG PO TABS: ORAL_TABLET | ORAL | 5 refills | Status: DC

## 2021-01-28 NOTE — Patient Instructions (Signed)
Increase gabapentin 300mg  three times daily - would recommend gradually increasing this dose to help prevent side effects such as fatigue  Continue baclofen at current dose - can consider increasing in the future if needed  Continue duloxetine at current dose  Continue Crestor 10mg  daily  for secondary stroke prevention  Continue to follow up with PCP regarding cholesterol and blood pressure management  Maintain strict control of hypertension with blood pressure goal below 130/90 and cholesterol with LDL cholesterol (bad cholesterol) goal below 70 mg/dL.       Followup in the future with me in 6 months or call earlier if needed       Thank you for coming to see at [facility]. I hope we have been able to provide you high quality care today.  You may receive a patient satisfaction survey over the next few weeks. We would appreciate your feedback and comments so that we may continue to improve ourselves and the health of our patients.

## 2021-01-28 NOTE — Progress Notes (Signed)
[facility] [address] [place]. [place] [address] [phone]       STROKE FOLLOW UP NOTE  [name] Date of Birth:  1960-10-22 Medical Record Number:  [id number]   Reason for Referral: stroke follow up    SUBJECTIVE:   CHIEF COMPLAINT:  Chief Complaint  Patient presents with   Follow-up    Rm 3 with daughter- here for 6 month f/u. Reports she has been doing ok. Reports she still has pain and tightness on the left arm.      HPI:   Haley Hernandez is a 60 y.o. 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  She continues to be followed in this office for stroke follow-up.  Today, 01/28/2021, returns for 69-monthfollow-up accompanied by her daughter.  Overall stable.  Denies new stroke/TIA symptoms.  C/o continued left sided pain (burning, cramping) despite use of baclofen, gabapentin and duloxetine. Increased duloxetine and baclofen at prior visit with some benefit.  Tolerating medications without side effects.  Completed therapies and currently doing HEP assisted by daughter who is her primary caregiver. Ambulating short distance with hemiwalker. Compliant on Crestor 10 mg daily without side effects.  Blood pressure today 142/84. Routinely monitors at home which has been stable (just took BP meds prior to visit).  No new concerns at this time.    History provided for reference purposes only Update 08/01/2020 JM: Ms. AIsabellreturns for 635-monthtroke follow-up accompanied by her daughter, AlEd Blalockince prior visit without new stroke/TIA symptoms Residual left spastic hemiplegia and dysarthria  Continues to work with PT and has been ambulating during sessions with AFO brace and hemiwalker!!  C/o spasticity and nerve pain -currently on baclofen 5 mg twice daily,  gabapentin 100 mg 3 times daily and recently started on duloxetine Continues to reside with her daughter who is her primary caregiver  Compliant on Crestor 10 mg daily -denies associated side effects Blood pressure today 153/83 -monitors at home and typically stable - per daughter, blood pressure typically elevated at doctor's appointments and when in pain HTN and HLD routinely monitored by cardiology  No further concerns at this time  Update 02/01/2020 JM: [name] for stroke follow-up accompanied by her daughter.  Stable since prior visit with residual deficits left spastic hemiplegia, and dysarthria. No residual swallowing difficulties on a regular diet- PEG tube has since been removed. She is currently working with outpatient PT/[facility]  She is able to transfer via stand/pivot.  PCP recently started baclofen 5 mg 1-2 tabs at bedtime for spasticity.  Daughter primary caregiver. Denies new or worsening stroke/TIA symptoms. Blood pressure today 150/92.  Occasionally monitors at home which has been stable.  No concerns at this time.  Update 10/20/2019 JM: [name] being seen for hospital follow-up accompanied by her daughter and granddaughter. Initially discharged to SNF but returned home with family yesterday. Residual deficits of left hemiplegia, dysphagia s/p PEG tube and modified diet, speech impairment, and right eye deviation.  In the process of setting up home health therapy and has PT coming at home today financial evaluation.  She is nonambulatory and transfers via wheelchair.  Her daughter was present during visit as well as her other daughter are currently caring for her full-time.  Diet modified without difficulty (daughter reports  chopped food with thin liquids) and supplemental nutrition as needed via PEG.  Blood pressure today initially elevated and on recheck 146/82.  No further concerns at this time.  Stroke admission 07/18/2019 [name] is a 60 y.o. female with history of ICH 7 yrs ago and HTN found unresponsive on 07/18/2019 with L sided hemiplegia, R gaze, and SBP 250. 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 Serial repeat CT heads below with eventual improvement on repeat CT head 4/5. 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 EEG showed severe diffuse encephalopathy with repeat EEG 4/5 R frontal temporal slowing c/w ICH, no seizure activity. LDL 63 and A1c 5.7. No prior use of antithrombotic and no indication for initiation given hemorrhage. Hypertensive emergency treated with Cleviprex and transition to Coreg 25 mg twice daily and chlorthalidone 12.5 mg daily with long-term BP goal normotensive range. No prior history of DM with hyperglycemia during admission requiring insulin coverage. Other stroke risk factors include obesity, prior history of ICH 7 years ago (no data available via epic) and obstructive sleep apnea. Residual deficits of dysphagia s/p PEG, nonverbal, no spontaneous extremity movement and left eye downward gaze, and right eye midposition with slight downward gaze. Palliative care met with family members and remain full code with full scope of treatment. Evaluated by therapy and recommended discharge to SNF.  Stroke:   R thalamic and basal ganglia ICH w/ IVH s/p EVD placement, hemorrhage secondary to hypertensive source Code Stroke CT head posterior R basal ganglia and R thalamic IPH. Moderate IVH. Mass effect w/ partial effacment R lateral and 3rd ventricles. 48m L midline shift. Suspicious for early hydrocephalus.  Repeat CT head 3/16 interval L frontal lobe EVD w/ tip at R thalamus. Interval increased in R ICH now w/ 72mL midline shift. IVH extended into B lateral, 3rd and 4th ventricles. Prominent B superior ophthalmic vein suggestion increased ICP.  CT repeat 3/17 - stable MLS 55m555mhematoma and  hydrocephalus CT repeat 3/20 - stable hematoma and edeam. Decreased hydrocephalus.  CT repeat 3/25 evolution R thalamic ICH w/ decreasing clot. Increased ventricular volume s/p EVD removal. 1cm midline shift. Sinusitis. CT repeat 3/26 - Stable volume of right thalamic and intraventricular hemorrhage. Ventriculomegaly with 2 mm of increased lateral ventricular diameter when compared yesterday. CT repeat 3/28 - Slight interval decrease in size of right thalamic hemorrhage, but with similar degree of surrounding vasogenic edema. Localized 9 mm right-to-left shift relatively unchanged. Intraventricular extension with small volume intraventricular blood, stable. Lateral ventriculomegaly not significantly changed. CT repeat 4/5 improvement in R thalamic hemorrhage, small ICH w/ improvement. Stable mild ventricular enlargement. Slight improvement in L midline shift.  2D Echo EF 60-65%. Severe LVH. 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 Pt to remain Full Code with full scope of treatment.       ROS:   14 system review of systems performed and negative with exception of those listed in HPI  PMH:  Past Medical History:  Diagnosis Date   COPD 06/27/2008   Qualifier: Diagnosis of  By: SooHalford Chessman, Vineet  Diabetes mellitus without complication ([place])    Hypertension    IVH (intraventricular hemorrhage) ([place]) 07/18/2019   OBSTRUCTIVE SLEEP APNEA 06/27/2008   Qualifier: Diagnosis of  By: Halford Chessman MD, Vineet     Stroke [facility])     PSH:  Past Surgical History:  Procedure Laterality Date   ABDOMINAL HYSTERECTOMY     BIOPSY  02/28/2020   Procedure: BIOPSY;  Surgeon:  Wilford Corner, MD;  Location: [facility] ENDOSCOPY;  Service: Endoscopy;;   COLONOSCOPY N/A 02/28/2020   Procedure: COLONOSCOPY;  Surgeon: Wilford Corner, MD;  Location: [facility] ENDOSCOPY;  Service: Endoscopy;  Laterality: N/A;   IR GASTROSTOMY TUBE MOD SED  08/03/2019   IR GASTROSTOMY TUBE REMOVAL  11/21/2019    Social History:  Social History   Socioeconomic History   Marital status: Single    Spouse name: Not on file   Number of children: Not on file   Years of education: Not on file   Highest education level: Not on file  Occupational History   Not on file  Tobacco Use   Smoking status: Never   Smokeless tobacco: Never  Substance and Sexual Activity   Alcohol use: Not Currently   Drug use: Not Currently   Sexual activity: Not on file  Other Topics Concern   Not on file  Social History Narrative   Not on file   Social Determinants of Health   Financial Resource Strain: Not on file  Food Insecurity: Not on file  Transportation Needs: Not on file  Physical Activity: Not on file  Stress: Not on file  Social Connections: Not on file  Intimate Partner Violence: Not on file    Family History:  Family History  Problem Relation Age of Onset   Hypertension Father    Diabetes Father     Medications:   Current Outpatient Medications on File Prior to Visit  Medication Sig Dispense Refill   albuterol (ACCUNEB) 1.25 MG/3ML nebulizer solution Take 1 ampule by nebulization every 6 (six) hours as needed for wheezing.     albuterol (VENTOLIN HFA) 108 (90 Base) MCG/ACT inhaler Inhale 2 puffs into the lungs every 6 (six) hours as needed for wheezing or shortness of breath.     Baclofen 5 MG TABS Take 5 mg by mouth in the morning AND 10 mg at bedtime. 90 tablet 5   busPIRone (BUSPAR) 5 MG tablet Take 1 tablet (5 mg total) by mouth every 12 (twelve) hours. 60 tablet 0   Cholecalciferol (D3-1000) 25 MCG (1000 UT) tablet Take 1,000 Units by mouth daily.     cloNIDine (CATAPRES - DOSED IN  MG/24 HR) 0.1 mg/24hr patch Place 0.1 mg onto the skin once a week.     docusate sodium (COLACE) 100 MG capsule Take 100 mg by mouth daily.     DULoxetine (CYMBALTA) 60 MG capsule Take 1 capsule (60 mg total) by mouth daily. 90 capsule 3   gabapentin (NEURONTIN) 100 MG capsule Take 100 mg by mouth 3 (three) times daily.     hydrochlorothiazide (HYDRODIURIL) 25 MG tablet Take 1 tablet (25 mg total) by mouth daily. 90 tablet 3   rosuvastatin (CRESTOR) 10 MG tablet TAKE 1 TABLET BY MOUTH EVERY DAY 90 tablet 3   chlorhexidine (PERIDEX) 0.12 % solution 15 mL by Mouth route Two (2) times a day. Rinse and spit out excess. (Patient not taking: Reported on 01/28/2021)     hydrALAZINE (APRESOLINE) 100 MG tablet Take 1 tablet (100 mg total) by mouth 2 (two)  times daily. 60 tablet 0   No current facility-administered medications on file prior to visit.    Allergies:   Allergies  Allergen Reactions   Losartan Swelling   Norvasc [Amlodipine] Swelling   Ace Inhibitors     History of swelling with losartan   Penicillins     Unable to verify reactions at this time/ Tolerated ceftriaxone March 2021      OBJECTIVE:  Physical Exam  Vitals:   01/28/21 0822  BP: (!) 142/84  Pulse: (!) 51  SpO2: 98%    There is no height or weight on file to calculate BMI. No results found.  General: well developed, well nourished, pleasant middle-age African-American female, seated, in no evident distress Head: head normocephalic and atraumatic.   Neck: supple with no carotid or supraclavicular bruits Cardiovascular: regular rate and rhythm, no murmurs Musculoskeletal: no deformity Skin:  no rash/petichiae Vascular:  Normal pulses all extremities   Neurologic Exam Mental Status: Awake and fully alert. Mild dysarthria. Oriented to place and time. Recent and remote memory impaired. Attention span, concentration and fund of knowledge impaired with daughter providing history. Mood and affect appropriate.   Cranial Nerves: Right gaze preference but able to cross midline. Visual fields decreased blink to threat on left (difficulty following directions for finger confrontation).  Hearing intact. Facial sensation intact.  Left lower facial weakness Motor: Full strength right upper and lower extremity.  LUE: 0/5 with increased tone and shoulder pain LLE: 2/5 hip flexor, 3/4 KE, 1/5 KF, 0/5 ADF and APF with increased tone and AFO brace Sensory.:  Left hemisensory impairment Coordination: Rapid alternating movements normal on right side. Finger-to-nose and heel-to-shin performed accurately on right side. Gait and Station: deferred as hemiwalker not present  Reflexes: brisk left side; 1+ and symmetric. Toes downgoing.        ASSESSMENT: Haley Hernandez is a 60 y.o. 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 Vascular risk factors include HTN and prior ICH 7 years ago.     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  Discussed potential side effects and advised to call with any questions or concerns or possible need of further dosage increase if indicated Continue Crestor 10 mg daily for secondary stroke prevention Avoidance of aspirin, aspirin-containing products and ibuprofen products due to ICMendes No indication to initiate antithrombotic as no prior stroke history or  cardiac/vascular stents Discussed importance of close PCP follow-up for aggressive stroke risk factor management  HTN: BP goal<130/90.  Slightly elevated today but stable at home.  On amlodipine, clonidine and hydralazine per PCP/cardiology    Follow up in 1 year or call earlier if needed   CC:  [facility] provider: Dr. SeBroadus JohnJoEdwinna AreolaMD    I spent 32 minutes of face-to-face and non-face-to-face time with patient and daughter.  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 Any transcriptional errors that result from this process are unintentional.

## 2021-02-03 NOTE — Progress Notes (Signed)
I agree with the above plan 

## 2021-04-16 ENCOUNTER — Other Ambulatory Visit: Payer: Self-pay | Admitting: Gastroenterology

## 2021-04-16 ENCOUNTER — Other Ambulatory Visit: Payer: Self-pay | Admitting: Internal Medicine

## 2021-07-08 ENCOUNTER — Encounter (HOSPITAL_COMMUNITY): Payer: Self-pay | Admitting: Gastroenterology

## 2021-07-08 ENCOUNTER — Other Ambulatory Visit: Payer: Self-pay | Admitting: Gastroenterology

## 2021-07-08 NOTE — Progress Notes (Signed)
Attempted to obtain medical history via telephone, unable to reach at this time. I left a voicemail to return pre surgical testing department's phone call.  

## 2021-07-16 ENCOUNTER — Other Ambulatory Visit: Payer: Self-pay

## 2021-07-16 ENCOUNTER — Encounter (HOSPITAL_COMMUNITY): Payer: Self-pay | Admitting: Gastroenterology

## 2021-07-16 ENCOUNTER — Ambulatory Visit (HOSPITAL_COMMUNITY)
Admission: RE | Admit: 2021-07-16 | Discharge: 2021-07-16 | Disposition: A | Discharge status: Home / Self Care | Payer: Medicaid Other | Attending: Gastroenterology | Admitting: Gastroenterology

## 2021-07-16 ENCOUNTER — Ambulatory Visit (HOSPITAL_COMMUNITY): Payer: Medicaid Other | Admitting: Anesthesiology

## 2021-07-16 ENCOUNTER — Ambulatory Visit (HOSPITAL_BASED_OUTPATIENT_CLINIC_OR_DEPARTMENT_OTHER): Payer: Medicaid Other | Admitting: Anesthesiology

## 2021-07-16 ENCOUNTER — Encounter (HOSPITAL_COMMUNITY)
Admission: RE | Disposition: A | Discharge status: Home / Self Care | Payer: Self-pay | Source: Home / Self Care | Attending: Gastroenterology

## 2021-07-16 DIAGNOSIS — K5730 Diverticulosis of large intestine without perforation or abscess without bleeding: Secondary | ICD-10-CM

## 2021-07-16 DIAGNOSIS — K640 First degree hemorrhoids: Secondary | ICD-10-CM | POA: Diagnosis not present

## 2021-07-16 DIAGNOSIS — Z1211 Encounter for screening for malignant neoplasm of colon: Secondary | ICD-10-CM

## 2021-07-16 DIAGNOSIS — I10 Essential (primary) hypertension: Secondary | ICD-10-CM | POA: Diagnosis not present

## 2021-07-16 DIAGNOSIS — K5641 Fecal impaction: Secondary | ICD-10-CM | POA: Diagnosis not present

## 2021-07-16 LAB — GLUCOSE, CAPILLARY: Glucose-Capillary: 90 mg/dL (ref 70–99)

## 2021-07-16 SURGERY — INVASIVE LAB ABORTED CASE
Anesthesia: Monitor Anesthesia Care

## 2021-07-16 MED ORDER — LACTATED RINGERS IV SOLN
INTRAVENOUS | Status: DC
  Administered 2021-07-16: 1000 mL via INTRAVENOUS

## 2021-07-16 MED ORDER — BUPIVACAINE IN DEXTROSE 0.75-8.25 % IT SOLN
INTRATHECAL | Status: DC | PRN
  Administered 2021-07-16: 60 mL via INTRATHECAL

## 2021-07-16 MED ORDER — PROPOFOL 500 MG/50ML IV EMUL
INTRAVENOUS | Status: DC | PRN
  Administered 2021-07-16: 150 ug/kg/min via INTRAVENOUS

## 2021-07-16 MED ORDER — PROPOFOL 10 MG/ML IV BOLUS
INTRAVENOUS | Status: DC | PRN
  Administered 2021-07-16: 20 mg via INTRAVENOUS

## 2021-07-16 SURGICAL SUPPLY — 22 items

## 2021-07-16 NOTE — Anesthesia Postprocedure Evaluation (Signed)
Anesthesia Post Note ? ?Patient: Haley Hernandez ? ?Procedure(s) Performed: ABORTED COLONOSCOPY ? ?  ? ?Patient location during evaluation: Endoscopy ?Anesthesia Type: MAC ?Level of consciousness: awake and alert ?Pain management: pain level controlled ?Vital Signs Assessment: post-procedure vital signs reviewed and stable ?Respiratory status: spontaneous breathing, nonlabored ventilation and respiratory function stable ?Cardiovascular status: stable and blood pressure returned to baseline ?Postop Assessment: no apparent nausea or vomiting ?Anesthetic complications: no ? ? ?No notable events documented. ? ?Last Vitals:  ?Vitals:  ? 07/16/21 1040 07/16/21 1050  ?BP: (!) 164/91 (!) 153/91  ?Pulse: (!) 55 (!) 55  ?Resp: 12 10  ?Temp:    ?SpO2: 94% 95%  ?  ?Last Pain:  ?Vitals:  ? 07/16/21 1050  ?TempSrc:   ?PainSc: 0-No pain  ? ? ?  ?  ?  ?  ?  ?  ? ?Jayda White,W. EDMOND ? ? ? ? ?

## 2021-07-16 NOTE — Discharge Instructions (Signed)
YOU HAD AN ENDOSCOPIC PROCEDURE TODAY: Refer to the procedure report and other information in the discharge instructions given to you for any specific questions about what was found during the examination. If this information does not answer your questions, please call [place] office at [phone] to clarify.  ? ?YOU SHOULD EXPECT: Some feelings of bloating in the abdomen. Passage of more gas than usual. Walking can help get rid of the air that was put into your GI tract during the procedure and reduce the bloating. If you had a lower endoscopy (such as a colonoscopy or flexible sigmoidoscopy) you may notice spotting of blood in your stool or on the toilet paper. Some abdominal soreness may be present for a day or two, also. ? ?DIET: Your first meal following the procedure should be a light meal and then it is ok to progress to your normal diet. A half-sandwich or bowl of soup is an example of a good first meal. Heavy or fried foods are harder to digest and may make you feel nauseous or bloated. Drink plenty of fluids but you should avoid alcoholic beverages for 24 hours.  ? ?ACTIVITY: Your care partner should take you home directly after the procedure. You should plan to take it easy, moving slowly for the rest of the day. You can resume normal activity the day after the procedure however YOU SHOULD NOT DRIVE, use power tools, machinery or perform tasks that involve climbing or major physical exertion for 24 hours (because of the sedation medicines used during the test).  ? ?SYMPTOMS TO REPORT IMMEDIATELY: ?A gastroenterologist can be reached at any hour. Please call [phone]  for any of the following symptoms:  ?Following lower endoscopy (colonoscopy, flexible sigmoidoscopy) ?Excessive amounts of blood in the stool  ?Significant tenderness, worsening of abdominal pains  ?Swelling of the abdomen that is new, acute  ?Fever of 100? or higher  ? ? ?FOLLOW UP:  ?If any biopsies were taken you will be contacted by  phone or by letter within the next 1-3 weeks. Call [phone]  if you have not heard about the biopsies in 3 weeks.  ?Please also call with any specific questions about appointments or follow up tests.  ?

## 2021-07-16 NOTE — Transfer of Care (Signed)
Immediate Anesthesia Transfer of Care Note ? ?Patient: Haley Hernandez ? ?Procedure(s) Performed: ABORTED COLONOSCOPY ? ?Patient Location: PACU ? ?Anesthesia Type:MAC ? ?Level of Consciousness: drowsy ? ?Airway & Oxygen Therapy: Patient Spontanous Breathing and Patient connected to nasal cannula oxygen ? ?Post-op Assessment: Report given to RN and Post -op Vital signs reviewed and stable ? ?Post vital signs: Reviewed and stable ? ?Last Vitals:  ?Vitals Value Taken Time  ?BP 149/85 07/16/21 1022  ?Temp    ?Pulse 57 07/16/21 1023  ?Resp 14 07/16/21 1023  ?SpO2 98 % 07/16/21 1023  ?Vitals shown include unvalidated device data. ? ?Last Pain:  ?Vitals:  ? 07/16/21 1022  ?TempSrc:   ?PainSc: Asleep  ?   ? ?  ? ?Complications: No notable events documented. ?

## 2021-07-16 NOTE — Consult Note (Signed)
Date of Initial H&P: 07/08/21 ? ?History reviewed, patient examined, no change in status, stable for surgery.  ? ?

## 2021-07-16 NOTE — Interval H&P Note (Signed)
History and Physical Interval Note: ? ?07/16/2021 ?9:57 AM ? ?[name]  has presented today for surgery, with the diagnosis of Screening.  The various methods of treatment have been discussed with the patient and family. After consideration of risks, benefits and other options for treatment, the patient has consented to  Procedure(s): ?COLONOSCOPY WITH PROPOFOL (N/A) as a surgical intervention.  The patient's history has been reviewed, patient examined, no change in status, stable for surgery.  I have reviewed the patient's chart and labs.  Questions were answered to the patient's satisfaction.   ? ? ?Haley Hernandez ? ? ?

## 2021-07-16 NOTE — Anesthesia Preprocedure Evaluation (Addendum)
Anesthesia Evaluation  ?Patient identified by MRN, date of birth, ID band ?Patient awake ? ? ? ?Reviewed: ?Allergy & Precautions, H&P , NPO status , Patient's Chart, lab work & pertinent test results ? ?Airway ?Mallampati: III ? ?TM Distance: >3 FB ?Neck ROM: Full ? ? ? Dental ?no notable dental hx. ?(+) Teeth Intact, Dental Advisory Given ?  ?Pulmonary ?sleep apnea and Continuous Positive Airway Pressure Ventilation , COPD,  ?  ?Pulmonary exam normal ?breath sounds clear to auscultation ? ? ? ? ? ? Cardiovascular ?hypertension, Pt. on medications ? ?Rhythm:Regular Rate:Normal ? ? ?  ?Neuro/Psych ?Depression CVA, Residual Symptoms   ? GI/Hepatic ?Neg liver ROS, GERD  ,  ?Endo/Other  ?diabetes ? Renal/GU ?negative Renal ROS  ?negative genitourinary ?  ?Musculoskeletal ? ? Abdominal ?  ?Peds ? Hematology ? ?(+) Blood dyscrasia, anemia ,   ?Anesthesia Other Findings ? ? Reproductive/Obstetrics ?negative OB ROS ? ?  ? ? ? ? ? ? ? ? ? ? ? ? ? ?  ?  ? ? ? ? ? ? ? ?Anesthesia Physical ?Anesthesia Plan ? ?ASA: 3 ? ?Anesthesia Plan: MAC  ? ?Post-op Pain Management: Minimal or no pain anticipated  ? ?Induction: Intravenous ? ?PONV Risk Score and Plan: 2 and Treatment may vary due to age or medical condition ? ?Airway Management Planned: Natural Airway and Simple Face Mask ? ?Additional Equipment:  ? ?Intra-op Plan:  ? ?Post-operative Plan:  ? ?Informed Consent: I have reviewed the patients History and Physical, chart, labs and discussed the procedure including the risks, benefits and alternatives for the proposed anesthesia with the patient or authorized representative who has indicated his/her understanding and acceptance.  ? ? ? ?Dental advisory given ? ?Plan Discussed with: CRNA ? ?Anesthesia Plan Comments:   ? ? ? ? ? ? ?Anesthesia Quick Evaluation ? ?

## 2021-07-16 NOTE — Op Note (Signed)
[facility] ?Patient Name: Haley Hernandez ?Procedure Date: 07/16/2021 ?MRN: [id number] ?Attending MD: Lear Ng , MD ?Date of Birth: Jan 31, 1961 ?CSN: [id number] ?Age: 61 ?Admit Type: Outpatient ?Procedure:                Colonoscopy ?Indications:              Screening for colorectal malignant neoplasm, Last  ?                          colonoscopy: October 2021 ?Providers:                Lear Ng, MD, Dulcy Fanny, Charlean Merl  ?                          Purcell Nails, Merchant navy officer, Stephanie [place] CRNA ?Referring MD:             Delphina Cahill, MD ?Medicines:                Propofol per Anesthesia, Monitored Anesthesia Care ?Complications:            No immediate complications. ?Estimated Blood Loss:     Estimated blood loss: none. ?Procedure:                Pre-Anesthesia Assessment: ?                          - Prior to the procedure, a History and Physical  ?                          was performed, and patient medications and  ?                          allergies were reviewed. The patient's tolerance of  ?                          previous anesthesia was also reviewed. The risks  ?                          and benefits of the procedure and the sedation  ?                          options and risks were discussed with the patient.  ?                          All questions were answered, and informed consent  ?                          was obtained. Prior Anticoagulants: The patient has  ?                          taken no previous anticoagulant or antiplatelet  ?                          agents. ASA Grade Assessment: III - A patient with  ?  severe systemic disease. After reviewing the risks  ?                          and benefits, the patient was deemed in  ?                          satisfactory condition to undergo the procedure. ?                          After obtaining informed consent, the colonoscope  ?                          was passed under direct  vision. Throughout the  ?                          procedure, the patient's blood pressure, pulse, and  ?                          oxygen saturations were monitored continuously. The  ?                          PCF-HQ190L ([id number]) Olympus colonoscope was  ?                          introduced through the anus and advanced to the the  ?                          sigmoid colon. The colonoscopy was performed with  ?                          difficulty due to poor bowel prep with stool  ?                          present. The patient tolerated the procedure well.  ?                          The quality of the bowel preparation was poor and  ?                          not adequate to identify polyps 6 mm and larger in  ?                          size. The rectum was photographed. ?Scope In: 10:12:35 AM ?Scope Out: 10:17:47 AM ?Total Procedure Duration: 0 hours 5 minutes 12 seconds  ?Findings: ?     The perianal and digital rectal examinations were normal. ?     Scattered small-mouthed diverticula were found in the sigmoid colon. ?     Internal hemorrhoids were found during retroflexion. The hemorrhoids  ?     were medium-sized and Grade I (internal hemorrhoids that do not  ?     prolapse). ?     Copious quantities of semi-solid solid stool was found in the rectum, in  ?     the recto-sigmoid colon and in the sigmoid colon, precluding  ?  visualization. ?Impression:               - Preparation of the colon was poor. ?                          - Preparation of the colon was inadequate. ?                          - Diverticulosis in the sigmoid colon. ?                          - Internal hemorrhoids. ?                          - Stool in the rectum, in the recto-sigmoid colon  ?                          and in the sigmoid colon. ?                          - No specimens collected. ?Moderate Sedation: ?     N/A - MAC procedure ?Recommendation:           - Patient has a contact number available for  ?                           emergencies. The signs and symptoms of potential  ?                          delayed complications were discussed with the  ?                          patient. Return to normal activities tomorrow.  ?                          Written discharge instructions were provided to the  ?                          patient. ?                          - Resume previous diet. ?                          - Repeat colonoscopy in 7 months because the bowel  ?                          preparation was poor. ?                          - Continue present medications. ?Procedure Code(s):        --- Professional --- ?                          [phone], 53, Colonoscopy, flexible; diagnostic,  ?  including collection of specimen(s) by brushing or  ?                          washing, when performed (separate procedure) ?Diagnosis Code(s):        --- Professional --- ?                          Z12.11, Encounter for screening for malignant  ?                          neoplasm of colon ?                          K64.0, First degree hemorrhoids ?                          K57.30, Diverticulosis of large intestine without  ?                          perforation or abscess without bleeding ?CPT copyright 2019 American Medical Association. All rights reserved. ?The codes documented in this report are preliminary and upon coder review may  ?be revised to meet current compliance requirements. ?Lear Ng, MD ?07/16/2021 10:24:06 AM ?This report has been signed electronically. ?Number of Addenda: 0 ?

## 2021-07-16 NOTE — H&P (View-Only) (Signed)
Date of Initial H&P: 07/08/21 ? ?History reviewed, patient examined, no change in status, stable for surgery.  ? ?

## 2021-07-19 ENCOUNTER — Other Ambulatory Visit: Payer: Self-pay | Admitting: Adult Health

## 2021-07-29 ENCOUNTER — Encounter: Payer: Self-pay | Admitting: Adult Health

## 2021-07-29 ENCOUNTER — Ambulatory Visit: Payer: Medicaid Other | Admitting: Adult Health

## 2021-07-29 VITALS — BP 165/93 | HR 63 | Ht 61.0 in | Wt 160.0 lb

## 2021-07-29 DIAGNOSIS — I69398 Other sequelae of cerebral infarction: Secondary | ICD-10-CM

## 2021-07-29 DIAGNOSIS — M792 Neuralgia and neuritis, unspecified: Secondary | ICD-10-CM | POA: Diagnosis not present

## 2021-07-29 DIAGNOSIS — G8114 Spastic hemiplegia affecting left nondominant side: Secondary | ICD-10-CM

## 2021-07-29 DIAGNOSIS — I612 Nontraumatic intracerebral hemorrhage in hemisphere, unspecified: Secondary | ICD-10-CM | POA: Diagnosis not present

## 2021-07-29 MED ORDER — BACLOFEN 5 MG PO TABS: ORAL_TABLET | ORAL | 11 refills | Status: AC

## 2021-07-29 NOTE — Progress Notes (Signed)
?[facility] ?[address] ?[place]. [place] [address] ?[phone] ? ?     STROKE FOLLOW UP NOTE ? ?[name] ?Date of Birth:  1960-07-22 ?Medical Record Number:  [id number]  ? ?Reason for Referral: stroke follow up ? ? ? ?SUBJECTIVE: ? ? ?CHIEF COMPLAINT:  ?Chief Complaint  ?Patient presents with  ? Follow-up  ?  Rm 2 with daughter Haley Hernandez  ?Pt is well and stable, no new concerns  ? ? ? ?HPI:  ? ?Haley Hernandez is a 61 y.o. 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  She continues to be followed in this office for stroke follow-up. ? ?Update 07/29/2021 JM: Returns for 39-monthstroke follow-up accompanied by her daughter, Haley Hernandez  Overall stable without new stroke/TIA symptoms.  Residual deficits stable without worsening.  Remains on baclofen, and duloxetine for post stroke pain and spasticity, denies side effects. Gabapentin self discontinued approx 1 mo ago due to fatigue and other potential side effects, no worsening of pain since stopping. Does still have left sided spasms, at times will improve after ambulating or stretching. Daughter continued to assist with HEP [facility] Ambulating short distance with hemiwalker, no recent falls.  Compliant on Crestor 10 mg daily, denies side effects.  Blood pressure today 165/93. Routinely monitors at home and typically 120s/80s. No new concerns at this time. ? ? ? ?History provided for reference purposes only ?Update 01/28/2021 JM: Returns for 649-monthollow-up accompanied by her daughter.  Overall stable.  Denies new stroke/TIA symptoms.  C/o continued left sided pain (burning, cramping) despite use of baclofen, gabapentin and duloxetine. Increased duloxetine and baclofen at prior visit with some benefit.   Tolerating medications without side effects.  Completed therapies and currently doing HEP assisted by daughter who is her primary caregiver. Ambulating short distance with hemiwalker. Compliant on Crestor 10 mg daily without side effects.  Blood pressure today 142/84. Routinely monitors at home which has been stable (just took BP meds prior to visit).  No new concerns at this time. ? ?Update 08/01/2020 JM: Ms. AnFiorenzaeturns for 6-71-monthroke follow-up accompanied by her daughter, AleUbaldo Hernandez?Stable since prior visit without new stroke/TIA symptoms ?Residual left spastic hemiplegia and dysarthria  ?Continues to work with PT and has been ambulating during sessions with AFO brace and hemiwalker!!  ?C/o spasticity and nerve pain -currently on baclofen 5 mg twice daily, gabapentin 100 mg 3 times daily and recently started on duloxetine ?Continues to reside with her daughter who is her primary caregiver ? ?Compliant on Crestor 10 mg daily -denies associated side effects ?Blood pressure today 153/83 -monitors at home and typically stable - per daughter, blood pressure typically elevated at doctor's appointments and when in pain ?HTN and HLD routinely monitored by cardiology ? ?No further concerns at this time ? ?Update 02/01/2020 JM: Ms. AndOehlerturns for stroke follow-up accompanied by her daughter.  Stable since prior visit with residual deficits left spastic hemiplegia, and dysarthria. No residual swallowing difficulties on a regular diet- PEG tube has since been removed. She is currently working with outpatient [facility]  She is able to transfer via stand/pivot.  PCP recently started baclofen 5 mg 1-2 tabs at bedtime for spasticity.  Daughter primary caregiver. Denies new or worsening stroke/TIA symptoms. Blood  pressure today 150/92.  Occasionally monitors at home which has been stable.  No concerns at this time. ? ?Update 10/20/2019 JM: [name] is being seen for hospital follow-up  accompanied by her daughter and granddaughter. Initially discharged to SNF but returned home with family yesterday. Residual deficits of left hemiplegia, dysphagia s/p PEG tube and modified diet, speech impairment, and right eye deviation.  In the process of setting up home health therapy and has PT coming at home today financial evaluation.  She is nonambulatory and transfers via wheelchair.  Her daughter was present during visit as well as her other daughter are currently caring for her full-time.  Diet modified without difficulty (daughter reports chopped food with thin liquids) and supplemental nutrition as needed via PEG.  Blood pressure today initially elevated and on recheck 146/82.  No further concerns at this time. ? ?Stroke admission 07/18/2019 ?[name] is a 62 y.o. female with history of ICH 7 yrs ago and HTN found unresponsive on 07/18/2019 with L sided hemiplegia, R gaze, and SBP 250. 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 Serial repeat CT heads below with eventual improvement on repeat CT head 4/5. 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 EEG showed severe diffuse encephalopathy with repeat EEG 4/5 R frontal temporal slowing c/w ICH, no seizure activity. LDL 63 and A1c 5.7. No prior use of antithrombotic and no indication for initiation given hemorrhage. Hypertensive emergency treated with Cleviprex and transition to Coreg 25 mg twice daily and chlorthalidone 12.5 mg daily with long-term BP goal normotensive range. No prior history of DM with hyperglycemia during admission requiring insulin coverage. Other stroke risk factors include obesity, prior history of ICH 7 years ago (no data available via epic) and obstructive sleep apnea. Residual deficits of dysphagia s/p PEG, nonverbal, no spontaneous extremity movement and left eye downward gaze, and right eye midposition with  slight downward gaze. Palliative care met with family members and remain full code with full scope of treatment. Evaluated by therapy and recommended discharge to SNF. ? ?Stroke:   R thalamic and basal ganglia ICH w/ IVH s/p EVD placement, hemorrhage secondary to hypertensive source ?Code Stroke CT head posterior R basal ganglia and R thalamic IPH. Moderate IVH. Mass effect w/ partial effacment R lateral and 3rd ventricles. 33m L midline shift. Suspicious for early hydrocephalus.  ?Repeat CT head 3/16 interval L frontal lobe EVD w/ tip at R thalamus. Interval increased in R ICH now w/ 763mL midline shift. IVH extended into B lateral, 3rd and 4th ventricles. Prominent B superior ophthalmic vein suggestion increased ICP.  ?CT repeat 3/17 - stable MLS 40m101mhematoma and hydrocephalus ?CT repeat 3/20 - stable hematoma and edeam. Decreased hydrocephalus.  ?CT repeat 3/25 evolution R thalamic ICH w/ decreasing clot. Increased ventricular volume s/p EVD removal. 1cm midline shift. Sinusitis. ?CT repeat 3/26 - Stable volume of right thalamic and intraventricular hemorrhage. Ventriculomegaly with 2 mm of increased lateral ventricular diameter when compared yesterday. ?CT repeat 3/28 - Slight interval decrease in size of right thalamic hemorrhage, but with similar degree of surrounding vasogenic edema. Localized 9 mm right-to-left shift relatively unchanged. Intraventricular extension with small volume intraventricular blood, stable. Lateral ventriculomegaly not significantly changed. ?CT repeat 4/5 improvement in R thalamic hemorrhage, small ICH w/ improvement. Stable mild ventricular enlargement. Slight improvement in L midline shift.  ?2D Echo EF 60-65%. Severe LVH. 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 Pt to remain Full Code with full scope of treatment.  ? ? ? ? ? ?ROS:   ?14 system review

## 2021-07-29 NOTE — Patient Instructions (Signed)
Continue Crestor 10 mg daily for secondary stroke prevention ? ?Continue baclofen and duloxetine at current dosages ? ?Continue to follow up with PCP regarding cholesterol and blood pressure management  ?Maintain strict control of hypertension with blood pressure goal below 130/90 and cholesterol with LDL cholesterol (bad cholesterol) goal below 70 mg/dL.  ? ?Signs of a Stroke? Follow the BEFAST method:  ?Balance Watch for a sudden loss of balance, trouble with coordination or vertigo ?Eyes Is there a sudden loss of vision in one or both eyes? Or double vision?  ?Face: Ask the person to smile. Does one side of the face droop or is it numb?  ?Arms: Ask the person to raise both arms. Does one arm drift downward? Is there weakness or numbness of a leg? ?Speech: Ask the person to repeat a simple phrase. Does the speech sound slurred/strange? Is the person confused ? ?Time: If you observe any of these signs, call 911. ? ? ? ? ? ? ? ?Thank you for coming to see [place] at [facility]. I hope we have been able to provide you high quality care today. ? ?You may receive a patient satisfaction survey over the next few weeks. We would appreciate your feedback and comments so that we may continue to improve ourselves and the health of our patients. ? ?

## 2021-09-19 ENCOUNTER — Other Ambulatory Visit: Payer: Self-pay | Admitting: Family Medicine

## 2021-09-19 DIAGNOSIS — Z1231 Encounter for screening mammogram for malignant neoplasm of breast: Secondary | ICD-10-CM

## 2021-09-26 DIAGNOSIS — R32 Unspecified urinary incontinence: Secondary | ICD-10-CM | POA: Insufficient documentation

## 2021-10-15 ENCOUNTER — Other Ambulatory Visit: Payer: Self-pay | Admitting: Cardiology

## 2021-10-22 NOTE — Addendum Note (Signed)
Addendum  created 10/22/21 1315 by Marny Lowenstein, CRNA   Intraprocedure Event edited

## 2021-11-06 ENCOUNTER — Other Ambulatory Visit: Payer: Self-pay | Admitting: Cardiology

## 2021-11-25 ENCOUNTER — Ambulatory Visit
Admission: RE | Admit: 2021-11-25 | Discharge: 2021-11-25 | Disposition: A | Discharge status: Home / Self Care | Payer: Medicaid Other | Source: Ambulatory Visit | Attending: Family Medicine | Admitting: Family Medicine

## 2021-11-25 DIAGNOSIS — Z1231 Encounter for screening mammogram for malignant neoplasm of breast: Secondary | ICD-10-CM

## 2021-12-06 ENCOUNTER — Ambulatory Visit (INDEPENDENT_AMBULATORY_CARE_PROVIDER_SITE_OTHER): Payer: Medicaid Other | Admitting: Cardiology

## 2021-12-06 ENCOUNTER — Encounter: Payer: Self-pay | Admitting: Cardiology

## 2021-12-06 VITALS — BP 112/72 | HR 48 | Ht 62.0 in | Wt 144.8 lb

## 2021-12-06 DIAGNOSIS — I2510 Atherosclerotic heart disease of native coronary artery without angina pectoris: Secondary | ICD-10-CM | POA: Diagnosis not present

## 2021-12-06 DIAGNOSIS — I152 Hypertension secondary to endocrine disorders: Secondary | ICD-10-CM

## 2021-12-06 DIAGNOSIS — I2584 Coronary atherosclerosis due to calcified coronary lesion: Secondary | ICD-10-CM | POA: Diagnosis not present

## 2021-12-06 DIAGNOSIS — E1159 Type 2 diabetes mellitus with other circulatory complications: Secondary | ICD-10-CM

## 2021-12-06 NOTE — Patient Instructions (Signed)
Medication Instructions:  Your physician recommends that you continue on your current medications as directed. Please refer to the Current Medication list given to you today.  *If you need a refill on your cardiac medications before your next appointment, please call your pharmacy*  Follow-Up: At [facility], you and your health needs are our priority.  As part of our continuing mission to provide you with exceptional heart care, we have created designated Provider Care Teams.  These Care Teams include your primary Cardiologist (physician) and Advanced Practice Providers (APPs -  Physician Assistants and Nurse Practitioners) who all work together to provide you with the care you need, when you need it.  Your next appointment:   1 year(s)  The format for your next appointment:   In Person  Provider:   Mark Skains, MD  

## 2021-12-06 NOTE — Progress Notes (Signed)
Cardiology Office Note:    Date:  12/06/2021   ID:  Haley Hernandez, DOB 01-16-1961, MRN [id number]  PCP:  Benita Stabile, MD   [facility] Providers Cardiologist:  Donato Schultz, MD     Referring MD: Benita Stabile, MD    History of Present Illness:    Haley Hernandez is a 61 y.o. female here for follow-up prior chest discomfort hospitalization in 2021, ER visit in 2022 with hypertension with prior stroke with intracranial hemorrhage, obstructive sleep apnea, prior GI bleed with no recurrence.  Echocardiogram in 2021 showed EF of 65 to 70% with grade 1 diastolic dysfunction.  Cardiac CT performed in November 2021 showed a calcium score of 2.  Overall reassuring.  0 to 24% stenosis of the LAD.  Aorta was 38 mm.  Occasionally would have tingling of her left chest.  This certainly could have been from spasticity given stroke.  Overall she is feeling better.  No recent bleeding episodes.  Haley Hernandez, her daughter is here with her.  Past Medical History:  Diagnosis Date   COPD 06/27/2008   Qualifier: Diagnosis of  By: Craige Cotta MD, Vineet     Diabetes mellitus without complication (HCC)    Hypertension    IVH (intraventricular hemorrhage) (HCC) 07/18/2019   OBSTRUCTIVE SLEEP APNEA 06/27/2008   Qualifier: Diagnosis of  By: Craige Cotta MD, Vineet     Stroke [facility])     Past Surgical History:  Procedure Laterality Date   ABDOMINAL HYSTERECTOMY     BIOPSY  02/28/2020   Procedure: BIOPSY;  Surgeon: Charlott Rakes, MD;  Location: [facility];  Service: Endoscopy;;   COLONOSCOPY N/A 02/28/2020   Procedure: COLONOSCOPY;  Surgeon: Charlott Rakes, MD;  Location: [facility];  Service: Endoscopy;  Laterality: N/A;   IR GASTROSTOMY TUBE MOD SED  08/03/2019   IR GASTROSTOMY TUBE REMOVAL  11/21/2019    Current Medications: Current Meds  Medication Sig   acetaminophen (TYLENOL) 325 MG tablet Take 650 mg by mouth every 6 (six) hours as needed for mild pain or moderate pain.   albuterol (VENTOLIN HFA)  108 (90 Base) MCG/ACT inhaler Inhale 2 puffs into the lungs every 6 (six) hours as needed for wheezing or shortness of breath.   Baclofen 5 MG TABS Take 5 mg by mouth in the morning AND 10 mg at bedtime.   Cholecalciferol (D3-1000) 25 MCG (1000 UT) tablet Take 1,000 Units by mouth daily.   cloNIDine (CATAPRES - DOSED IN MG/24 HR) 0.1 mg/24hr patch Place 0.1 mg onto the skin once a week.   DULoxetine (CYMBALTA) 60 MG capsule TAKE 1 CAPSULE BY MOUTH EVERY DAY   hydrochlorothiazide (HYDRODIURIL) 25 MG tablet Take 1 tablet by mouth daily. Please call [phone] to schedule an appointment for future refills. Thank you. 1st attempt.   rosuvastatin (CRESTOR) 10 MG tablet TAKE 1 TABLET BY MOUTH EVERY DAY (Patient taking differently: Take 10 mg by mouth at bedtime.)     Allergies:   Losartan, Norvasc [amlodipine], Ace inhibitors, and Penicillins   Social History   Socioeconomic History   Marital status: Single    Spouse name: Not on file   Number of children: Not on file   Years of education: Not on file   Highest education level: Not on file  Occupational History   Not on file  Tobacco Use   Smoking status: Never   Smokeless tobacco: Never  Substance and Sexual Activity   Alcohol use: Not Currently   Drug use: Not Currently  Sexual activity: Not on file  Other Topics Concern   Not on file  Social History Narrative   Not on file   Social Determinants of Health   Financial Resource Strain: Not on file  Food Insecurity: Not on file  Transportation Needs: Not on file  Physical Activity: Not on file  Stress: Not on file  Social Connections: Not on file     Family History: The patient's family history includes Diabetes in her father; Hypertension in her father.  ROS:   Please see the history of present illness.     All other systems reviewed and are negative.  EKGs/Labs/Other Studies Reviewed:    The following studies were reviewed today:  ECHO 2021:   1. Left ventricular  ejection fraction, by estimation, is 65 to 70%. The  left ventricle has hyperdynamic function. The left ventricle has no  regional wall motion abnormalities. There is moderate left ventricular  hypertrophy. Left ventricular diastolic  parameters are consistent with Grade I diastolic dysfunction (impaired  relaxation).   2. Right ventricular systolic function is normal. The right ventricular  size is normal. Tricuspid regurgitation signal is inadequate for assessing  PA pressure.   3. Left atrial size was mildly dilated.   4. A small pericardial effusion is present.   5. The mitral valve is normal in structure. No evidence of mitral valve  regurgitation. No evidence of mitral stenosis.   6. The aortic valve is tricuspid. Aortic valve regurgitation is not  visualized. Mild aortic valve sclerosis is present, with no evidence of  aortic valve stenosis.   7. The inferior vena cava is normal in size with greater than 50%  respiratory variability, suggesting right atrial pressure of 3 mmHg.   EKG:  EKG is  ordered today.  The ekg ordered today demonstrates sinus bradycardia 48 first-degree AV block 222 ms LVH with repolarization abnormality.  Recent Labs: No results found for requested labs within last 365 days.  Recent Lipid Panel    Component Value Date/Time   CHOL 180 07/20/2019 0556   TRIG 201 (H) 07/30/2019 0637   HDL 45 07/20/2019 0556   CHOLHDL 4.0 07/20/2019 0556   VLDL 72 (H) 07/20/2019 0556   LDLCALC 63 07/20/2019 0556     Risk Assessment/Calculations:              Physical Exam:    VS:  BP 112/72   Pulse (!) 48   Ht 5\' 2"  (1.575 m)   Wt 144 lb 12.8 oz (65.7 kg)   SpO2 95%   BMI 26.48 kg/m     Wt Readings from Last 3 Encounters:  12/06/21 144 lb 12.8 oz (65.7 kg)  07/29/21 160 lb (72.6 kg)  09/20/20 157 lb (71.2 kg)     GEN: In wheelchair well nourished, well developed in no acute distress HEENT: Normal NECK: No JVD; No carotid bruits LYMPHATICS: No  lymphadenopathy CARDIAC: RRR, no murmurs, no rubs, gallops RESPIRATORY:  Clear to auscultation without rales, wheezing or rhonchi  ABDOMEN: Soft, non-tender, non-distended MUSCULOSKELETAL:  No edema; No deformity  SKIN: Warm and dry NEUROLOGIC:  Alert and oriented x 3, left-sided spasticity PSYCHIATRIC:  Normal affect   ASSESSMENT:    1. Coronary artery calcification   2. Hypertension associated with type 2 diabetes mellitus (HCC)    PLAN:    In order of problems listed above:  Difficult to control hypertension -Has been on clonidine patch hydralazine 100 mg twice a day hydrochlorothiazide 25 mg daily.  Today under excellent control. -Lab work from May 2023 outside labs show creatinine of 0.8 potassium 4.2 TSH 0.8  Mixed hyperlipidemia - Currently on Crestor 10 mg a day with last LDL from outside labs 51.  Excellent.  Mild nonobstructive coronary artery disease - Seen on coronary CT scan 2021.  Continue with Crestor.  She is not on low-dose aspirin given prior history of ICH as well as GI bleed.  Abnormal EKG - Shows LVH-like pattern.  Repolarization abnormality.  Moderate LVH noted on prior echocardiogram.  History of stroke, intracranial hemorrhage, left-sided spastic hemiplegia - Neurology notes reviewed.      Medication Adjustments/Labs and Tests Ordered: Current medicines are reviewed at length with the patient today.  Concerns regarding medicines are outlined above.  Orders Placed This Encounter  Procedures   EKG 12-Lead   No orders of the defined types were placed in this encounter.   Patient Instructions  Medication Instructions:  Your physician recommends that you continue on your current medications as directed. Please refer to the Current Medication list given to you today.  *If you need a refill on your cardiac medications before your next appointment, please call your pharmacy*  Follow-Up: At [facility], you and your health needs are our priority.   As part of our continuing mission to provide you with exceptional heart care, we have created designated Provider Care Teams.  These Care Teams include your primary Cardiologist (physician) and Advanced Practice Providers (APPs -  Physician Assistants and Nurse Practitioners) who all work together to provide you with the care you need, when you need it. .    Your next appointment:   1 year(s)  The format for your next appointment:   In Person  Provider:   Donato Schultz, MD {          Signed, Donato Schultz, MD  12/06/2021 10:29 AM    [facility]

## 2021-12-23 ENCOUNTER — Other Ambulatory Visit: Payer: Self-pay | Admitting: Cardiology

## 2021-12-30 ENCOUNTER — Other Ambulatory Visit: Payer: Self-pay | Admitting: Cardiology

## 2022-01-23 ENCOUNTER — Other Ambulatory Visit: Payer: Self-pay | Admitting: Gastroenterology

## 2022-04-08 ENCOUNTER — Encounter (HOSPITAL_COMMUNITY): Payer: Self-pay | Admitting: Gastroenterology

## 2022-04-14 NOTE — Anesthesia Preprocedure Evaluation (Signed)
Anesthesia Evaluation  Patient identified by MRN, date of birth, ID band Patient awake    Reviewed: Allergy & Precautions, NPO status , Patient's Chart, lab work & pertinent test results  Airway Mallampati: II  TM Distance: >3 FB Neck ROM: Full    Dental no notable dental hx. (+) Teeth Intact, Dental Advisory Given   Pulmonary sleep apnea and Continuous Positive Airway Pressure Ventilation , COPD   Pulmonary exam normal breath sounds clear to auscultation       Cardiovascular hypertension, Pt. on medications Normal cardiovascular exam Rhythm:Regular Rate:Normal     Neuro/Psych  PSYCHIATRIC DISORDERS  Depression    CVA, Residual Symptoms    GI/Hepatic Neg liver ROS,,,  Endo/Other  diabetes    Renal/GU negative Renal ROS     Musculoskeletal negative musculoskeletal ROS (+)    Abdominal   Peds  Hematology   Anesthesia Other Findings All: losartan Norvasc Aceinhibitors, PCN  Reproductive/Obstetrics                             Anesthesia Physical Anesthesia Plan  ASA: 3  Anesthesia Plan: MAC   Post-op Pain Management:    Induction: Intravenous  PONV Risk Score and Plan: Treatment may vary due to age or medical condition and Propofol infusion  Airway Management Planned: Nasal Cannula and Natural Airway  Additional Equipment: None  Intra-op Plan:   Post-operative Plan:   Informed Consent: I have reviewed the patients History and Physical, chart, labs and discussed the procedure including the risks, benefits and alternatives for the proposed anesthesia with the patient or authorized representative who has indicated his/her understanding and acceptance.     Dental advisory given  Plan Discussed with: CRNA and Anesthesiologist  Anesthesia Plan Comments: (Screening colonoscopy)        Anesthesia Quick Evaluation

## 2022-04-15 ENCOUNTER — Ambulatory Visit (HOSPITAL_COMMUNITY)
Admission: RE | Admit: 2022-04-15 | Discharge: 2022-04-15 | Disposition: A | Discharge status: Home / Self Care | Payer: Medicare Other | Attending: Gastroenterology | Admitting: Gastroenterology

## 2022-04-15 ENCOUNTER — Encounter (HOSPITAL_COMMUNITY): Payer: Self-pay | Admitting: Gastroenterology

## 2022-04-15 ENCOUNTER — Ambulatory Visit (HOSPITAL_BASED_OUTPATIENT_CLINIC_OR_DEPARTMENT_OTHER): Payer: Medicare Other | Admitting: Anesthesiology

## 2022-04-15 ENCOUNTER — Encounter (HOSPITAL_COMMUNITY)
Admission: RE | Disposition: A | Discharge status: Home / Self Care | Payer: Self-pay | Source: Home / Self Care | Attending: Gastroenterology

## 2022-04-15 ENCOUNTER — Other Ambulatory Visit: Payer: Self-pay

## 2022-04-15 ENCOUNTER — Ambulatory Visit (HOSPITAL_COMMUNITY): Payer: Medicare Other | Admitting: Anesthesiology

## 2022-04-15 DIAGNOSIS — I10 Essential (primary) hypertension: Secondary | ICD-10-CM | POA: Diagnosis not present

## 2022-04-15 DIAGNOSIS — Z1211 Encounter for screening for malignant neoplasm of colon: Secondary | ICD-10-CM | POA: Diagnosis not present

## 2022-04-15 DIAGNOSIS — Z8673 Personal history of transient ischemic attack (TIA), and cerebral infarction without residual deficits: Secondary | ICD-10-CM | POA: Insufficient documentation

## 2022-04-15 DIAGNOSIS — K640 First degree hemorrhoids: Secondary | ICD-10-CM

## 2022-04-15 DIAGNOSIS — J449 Chronic obstructive pulmonary disease, unspecified: Secondary | ICD-10-CM

## 2022-04-15 DIAGNOSIS — G4730 Sleep apnea, unspecified: Secondary | ICD-10-CM | POA: Insufficient documentation

## 2022-04-15 DIAGNOSIS — E119 Type 2 diabetes mellitus without complications: Secondary | ICD-10-CM | POA: Insufficient documentation

## 2022-04-15 DIAGNOSIS — K5730 Diverticulosis of large intestine without perforation or abscess without bleeding: Secondary | ICD-10-CM

## 2022-04-15 HISTORY — PX: COLONOSCOPY WITH PROPOFOL: SHX5780

## 2022-04-15 SURGERY — COLONOSCOPY WITH PROPOFOL
Anesthesia: Monitor Anesthesia Care

## 2022-04-15 MED ORDER — PROPOFOL 500 MG/50ML IV EMUL
INTRAVENOUS | Status: AC
  Filled 2022-04-15: qty 50

## 2022-04-15 MED ORDER — PROPOFOL 500 MG/50ML IV EMUL
INTRAVENOUS | Status: DC | PRN
  Administered 2022-04-15: 100 ug/kg/min via INTRAVENOUS

## 2022-04-15 MED ORDER — LIDOCAINE 2% (20 MG/ML) 5 ML SYRINGE
INTRAMUSCULAR | Status: DC | PRN
  Administered 2022-04-15: 40 mg via INTRAVENOUS

## 2022-04-15 MED ORDER — LACTATED RINGERS IV SOLN
INTRAVENOUS | Status: DC
  Administered 2022-04-15: 1000 mL via INTRAVENOUS

## 2022-04-15 SURGICAL SUPPLY — 22 items

## 2022-04-15 NOTE — Op Note (Signed)
[facility] Patient Name: Haley Hernandez Procedure Date: 04/15/2022 MRN: [id number] Attending MD: Lear Ng , MD, [id number] Date of Birth: 1960-06-08 CSN: [id number] Age: 61 Admit Type: Outpatient Procedure:                Colonoscopy Indications:              Screening for colorectal malignant neoplasm, Last                            colonoscopy: March 2023 Providers:                Lear Ng, MD, Adah Perl RN, RN,                            Cletis Athens, Technician Referring MD:             Delphina Cahill, MD Medicines:                Propofol per Anesthesia, Monitored Anesthesia Care Complications:            No immediate complications. Estimated Blood Loss:     Estimated blood loss: none. Procedure:                Pre-Anesthesia Assessment:                           - Prior to the procedure, a History and Physical                            was performed, and patient medications and                            allergies were reviewed. The patient's tolerance of                            previous anesthesia was also reviewed. The risks                            and benefits of the procedure and the sedation                            options and risks were discussed with the patient.                            All questions were answered, and informed consent                            was obtained. Prior Anticoagulants: The patient has                            taken no anticoagulant or antiplatelet agents. ASA                            Grade Assessment: III - A patient with severe  systemic disease. After reviewing the risks and                            benefits, the patient was deemed in satisfactory                            condition to undergo the procedure.                           After obtaining informed consent, the colonoscope                            was passed under direct vision. Throughout  the                            procedure, the patient's blood pressure, pulse, and                            oxygen saturations were monitored continuously. The                            [id number]-HQ190L ([id number]) Olympus colonoscope was                            introduced through the anus and advanced to the the                            cecum, identified by appendiceal orifice and                            ileocecal valve. The colonoscopy was performed                            without difficulty. The patient tolerated the                            procedure well. The quality of the bowel                            preparation was fair. The ileocecal valve,                            appendiceal orifice, and rectum were photographed. Scope In: 9:44:08 AM Scope Out: 10:05:57 AM Total Procedure Duration: 0 hours 21 minutes 49 seconds  Findings:      The perianal and digital rectal examinations were normal.      A moderate amount of liquid semi-liquid stool was found in the sigmoid       colon and in the descending colon, making visualization difficult.       Lavage of the area was performed, resulting in clearance with good       visualization.      Multiple large-mouthed, medium-mouthed and small-mouthed diverticula       were found in the entire colon.      Internal hemorrhoids were found during retroflexion. The hemorrhoids  were small and Grade I (internal hemorrhoids that do not prolapse). Impression:               - Preparation of the colon was fair.                           - Stool in the sigmoid colon and in the descending                            colon.                           - Diverticulosis in the entire examined colon.                           - Internal hemorrhoids.                           - No specimens collected. Moderate Sedation:      N/A - MAC procedure Recommendation:           - Patient has a contact number available for                             emergencies. The signs and symptoms of potential                            delayed complications were discussed with the                            patient. Return to normal activities tomorrow.                            Written discharge instructions were provided to the                            patient.                           - High fiber diet.                           - Continue present medications.                           - Repeat colonoscopy in 10 years for screening                            purposes. Procedure Code(s):        --- Professional ---                           [phone], Colonoscopy, flexible; diagnostic, including                            collection of specimen(s) by brushing or washing,  when performed (separate procedure) Diagnosis Code(s):        --- Professional ---                           Z12.11, Encounter for screening for malignant                            neoplasm of colon                           K64.0, First degree hemorrhoids                           K57.30, Diverticulosis of large intestine without                            perforation or abscess without bleeding CPT copyright 2022 American Medical Association. All rights reserved. The codes documented in this report are preliminary and upon coder review may  be revised to meet current compliance requirements. Lear Ng, MD 04/15/2022 10:15:49 AM This report has been signed electronically. Number of Addenda: 0

## 2022-04-15 NOTE — Anesthesia Procedure Notes (Signed)
Procedure Name: MAC Date/Time: 04/15/2022 9:39 AM  Performed by: Jenne Campus, CRNAPre-anesthesia Checklist: Patient identified, Emergency Drugs available, Suction available and Patient being monitored Oxygen Delivery Method: Simple face mask Placement Confirmation: positive ETCO2

## 2022-04-15 NOTE — Interval H&P Note (Signed)
History and Physical Interval Note:  04/15/2022 9:24 AM  [name]  has presented today for surgery, with the diagnosis of Screening.  The various methods of treatment have been discussed with the patient and family. After consideration of risks, benefits and other options for treatment, the patient has consented to  Procedure(s): COLONOSCOPY WITH PROPOFOL (N/A) as a surgical intervention.  The patient's history has been reviewed, patient examined, no change in status, stable for surgery.  I have reviewed the patient's chart and labs.  Questions were answered to the patient's satisfaction.     [name]

## 2022-04-15 NOTE — Anesthesia Postprocedure Evaluation (Signed)
Anesthesia Post Note  Patient: Haley Hernandez  Procedure(s) Performed: COLONOSCOPY WITH PROPOFOL     Patient location during evaluation: Endoscopy Anesthesia Type: MAC Level of consciousness: awake and alert Pain management: pain level controlled Vital Signs Assessment: post-procedure vital signs reviewed and stable Respiratory status: spontaneous breathing, nonlabored ventilation, respiratory function stable and patient connected to nasal cannula oxygen Cardiovascular status: blood pressure returned to baseline and stable Postop Assessment: no apparent nausea or vomiting Anesthetic complications: no  No notable events documented.  Last Vitals:  Vitals:   04/15/22 0859 04/15/22 1015  BP: 137/87   Pulse: (!) 2   Resp: 16   Temp: 36.7 C 36.7 C  SpO2: 95%     Last Pain:  Vitals:   04/15/22 1035  TempSrc:   PainSc: 3                  Barnet Glasgow

## 2022-04-15 NOTE — Transfer of Care (Signed)
Immediate Anesthesia Transfer of Care Note  Patient: Haley Hernandez  Procedure(s) Performed: COLONOSCOPY WITH PROPOFOL  Patient Location: Endoscopy Unit  Anesthesia Type:MAC  Level of Consciousness: oriented, drowsy, and patient cooperative  Airway & Oxygen Therapy: Patient Spontanous Breathing and Patient connected to face mask oxygen  Post-op Assessment: Report given to RN and Post -op Vital signs reviewed and stable  Post vital signs: Reviewed  Last Vitals:  Vitals Value Taken Time  BP 189/86 04/15/22 1014  Temp    Pulse 90 04/15/22 1015  Resp 14 04/15/22 1015  SpO2 100 % 04/15/22 1015  Vitals shown include unvalidated device data.  Last Pain:  Vitals:   04/15/22 0859  TempSrc: Temporal  PainSc: 3          Complications: No notable events documented.

## 2022-04-15 NOTE — H&P (Signed)
Date of Initial H&P: 04/08/22  History reviewed, patient examined, no change in status, stable for surgery.

## 2022-04-15 NOTE — Discharge Instructions (Signed)

## 2022-04-20 ENCOUNTER — Encounter (HOSPITAL_COMMUNITY): Payer: Self-pay | Admitting: Gastroenterology

## 2022-05-29 DIAGNOSIS — R634 Abnormal weight loss: Secondary | ICD-10-CM | POA: Insufficient documentation

## 2022-05-29 DIAGNOSIS — R7303 Prediabetes: Secondary | ICD-10-CM | POA: Insufficient documentation

## 2022-10-22 DIAGNOSIS — E782 Mixed hyperlipidemia: Secondary | ICD-10-CM | POA: Insufficient documentation

## 2022-11-17 DIAGNOSIS — E870 Hyperosmolality and hypernatremia: Secondary | ICD-10-CM | POA: Insufficient documentation

## 2022-11-18 ENCOUNTER — Other Ambulatory Visit (HOSPITAL_COMMUNITY): Payer: Self-pay | Admitting: Family Medicine

## 2022-11-18 ENCOUNTER — Telehealth: Payer: Self-pay | Admitting: Cardiology

## 2022-11-18 DIAGNOSIS — Z1231 Encounter for screening mammogram for malignant neoplasm of breast: Secondary | ICD-10-CM

## 2022-11-18 NOTE — Telephone Encounter (Signed)
Patient's daughter returned call.  She said they were looking to try to simplify, reduce medication and since BP has been running good PCP suggesting hydrochlorothiazide would be the best one to consider reducing but asked that they review w Dr. Anne Fu first.    Adv he is out of the office this week and in the hospital working next week.  Adv that when he has a chance to review we will call her back with his recommendations.

## 2022-11-18 NOTE — Telephone Encounter (Signed)
Left message to call back  

## 2022-11-18 NOTE — Telephone Encounter (Signed)
Pt c/o medication issue:  1. Name of Medication:  hydrochlorothiazide (HYDRODIURIL) 25 MG tablet   cloNIDine (CATAPRES - DOSED IN MG/24 HR) 0.2 mg/24hr patch    2. How are you currently taking this medication (dosage and times per day)? As written   3. Are you having a reaction (difficulty breathing--STAT)? No   4. What is your medication issue? Pt daughter called in stating pt's bp has been around 110-120/80. She states they saw pt's PCP today and they suggested pt discontinue hydrochlorothiazide because she is on a lot of bp medications. Please advise.

## 2022-12-03 ENCOUNTER — Ambulatory Visit
Admission: RE | Admit: 2022-12-03 | Discharge: 2022-12-03 | Disposition: A | Discharge status: Home / Self Care | Payer: 59 | Source: Ambulatory Visit | Attending: Family Medicine | Admitting: Family Medicine

## 2022-12-03 ENCOUNTER — Ambulatory Visit: Payer: 59

## 2022-12-03 DIAGNOSIS — Z1231 Encounter for screening mammogram for malignant neoplasm of breast: Secondary | ICD-10-CM

## 2022-12-22 NOTE — Telephone Encounter (Signed)
I am fine with primary care physicians recommendations.  Thank you  ___________________________________________________________  Jeanene Erb the patient's daughter and let her know that Dr. Anne Fu is okay w Dr. Manon Hilding recommendation to stop hydrochlorothiazide.  Will keep an eye on blood pressure.  Will route to Dr. Margo Aye to let him know and update medication list.   Pt's daughter grateful for information provided.

## 2023-02-25 ENCOUNTER — Ambulatory Visit: Payer: 59 | Attending: Cardiology | Admitting: Cardiology

## 2023-02-26 ENCOUNTER — Encounter: Payer: Self-pay | Admitting: Cardiology

## 2023-05-19 ENCOUNTER — Other Ambulatory Visit: Payer: Self-pay | Admitting: Cardiology

## 2023-06-25 ENCOUNTER — Other Ambulatory Visit: Payer: Self-pay

## 2023-06-25 ENCOUNTER — Other Ambulatory Visit: Payer: Self-pay | Admitting: Cardiology

## 2023-06-25 MED ORDER — ROSUVASTATIN CALCIUM 10 MG PO TABS: 10.0000 mg | ORAL_TABLET | Freq: Every day | ORAL | 0 refills | Status: DC

## 2023-07-02 ENCOUNTER — Encounter: Payer: Self-pay | Admitting: Podiatry

## 2023-07-02 ENCOUNTER — Ambulatory Visit (INDEPENDENT_AMBULATORY_CARE_PROVIDER_SITE_OTHER): Payer: 59 | Admitting: Podiatry

## 2023-07-02 DIAGNOSIS — B351 Tinea unguium: Secondary | ICD-10-CM | POA: Diagnosis not present

## 2023-07-02 DIAGNOSIS — D2372 Other benign neoplasm of skin of left lower limb, including hip: Secondary | ICD-10-CM

## 2023-07-02 DIAGNOSIS — M79676 Pain in unspecified toe(s): Secondary | ICD-10-CM

## 2023-07-02 NOTE — Progress Notes (Signed)
 Subjective:  Patient ID: Haley Hernandez, female    DOB: [birth year],  MRN: [id number] HPI Chief Complaint  Patient presents with   Foot Pain    Foot exam - wears brace on left foot, 2nd and 3rd toes left are sore, appears they stay bent up in the shoe, toenails long, 1st bilateral thick and discolored, callused areas from rubbing at 5th toe left and 1st MPJ left    New Patient (Initial Visit)    63 y.o. female presents with the above complaint.   ROS: Denies fever chills nausea vomiting muscle aches pains calf pain back pain chest pain shortness of breath.  History of right hemispheric stroke affecting her left side hyper sensate left lower extremity muscle tone diminished  Past Medical History:  Diagnosis Date   COPD 06/27/2008   Qualifier: Diagnosis of  By: Craige Cotta MD, Vineet     Diabetes mellitus without complication (HCC)    Hypertension    IVH (intraventricular hemorrhage) (HCC) 07/18/2019   OBSTRUCTIVE SLEEP APNEA 06/27/2008   Qualifier: Diagnosis of  By: Craige Cotta MD, Vineet     Stroke [facility])    Past Surgical History:  Procedure Laterality Date   ABDOMINAL HYSTERECTOMY     BIOPSY  02/28/2020   Procedure: BIOPSY;  Surgeon: Charlott Rakes, MD;  Location: [facility];  Service: Endoscopy;;   COLONOSCOPY N/A 02/28/2020   Procedure: COLONOSCOPY;  Surgeon: Charlott Rakes, MD;  Location: [facility];  Service: Endoscopy;  Laterality: N/A;   COLONOSCOPY WITH PROPOFOL N/A 04/15/2022   Procedure: COLONOSCOPY WITH PROPOFOL;  Surgeon: Charlott Rakes, MD;  Location: [facility];  Service: Gastroenterology;  Laterality: N/A;   IR GASTROSTOMY TUBE MOD SED  08/03/2019   IR GASTROSTOMY TUBE REMOVAL  11/21/2019    Current Outpatient Medications:    hydrALAZINE (APRESOLINE) 100 MG tablet, Take 1 tablet (100 mg total) by mouth 2 (two) times daily., Disp: 60 tablet, Rfl: 0   terbinafine (LAMISIL) 250 MG tablet, Take 1 tablet by mouth daily., Disp: , Rfl:    Baclofen 5 MG TABS, Take 5 mg  by mouth in the morning AND 10 mg at bedtime. (Patient taking differently: Take 5 mg daily), Disp: 90 tablet, Rfl: 11   Cholecalciferol (D3-1000) 25 MCG (1000 UT) tablet, Take 1,000 Units by mouth daily., Disp: , Rfl:    cloNIDine (CATAPRES - DOSED IN MG/24 HR) 0.2 mg/24hr patch, Place 0.2 mg onto the skin once a week., Disp: , Rfl:    rosuvastatin (CRESTOR) 10 MG tablet, Take 1 tablet (10 mg total) by mouth daily., Disp: 7 tablet, Rfl: 0  Allergies  Allergen Reactions   Losartan Swelling   Norvasc [Amlodipine] Swelling   Ace Inhibitors     History of swelling with losartan   Penicillins     Unable to verify reactions at this time/ Tolerated ceftriaxone March 2021   Review of Systems Objective:  There were no vitals filed for this visit.  General: Well developed, nourished, in no acute distress, alert and oriented x3   Dermatological: Skin is warm, dry and supple bilateral. Nails x 10 are well maintained; though her toenails are thick and dystrophic particularly the hallux nails bilateral.  Remaining integument appears unremarkable at this time. There are no open sores, no preulcerative lesions, no rash or signs of infection present.  Vascular: Dorsalis Pedis artery and Posterior Tibial artery pedal pulses are 2/4 bilateral with immedate capillary fill time. Pedal hair growth present. No varicosities and no lower extremity edema present bilateral.  Neruologic: Right side is grossly intact with normal sensorium.  Left side hyper sensate left lower extremity.  Muscle strength is absent left normal right.  Musculoskeletal: No gross boney pedal deformities bilateral. No pain, crepitus, or limitation noted with foot and ankle range of motion bilateral.  Gait: Wheelchair bound   Radiographs:  None taken  Assessment & Plan:   Assessment: Nail dystrophy hallux bilateral  Plan: Debridement of nails 1 through 5 bilateral forefoot.     Daequan Kozma T. [place], [place]

## 2023-09-02 ENCOUNTER — Telehealth: Payer: Self-pay

## 2023-09-02 NOTE — Progress Notes (Signed)
   09/02/2023  Patient ID: Haley Hernandez, female   DOB: 1960-05-10, 63 y.o.   MRN: [id number]  Adherence Monitoring:  -Outreach unsuccessful, left voicemail to return call. Will attempt again later this week and coordinate refills with PCP  Flint Hummer, PharmD

## 2023-09-04 ENCOUNTER — Telehealth: Payer: Self-pay

## 2023-09-04 NOTE — Progress Notes (Signed)
   09/04/2023  Patient ID: Haley Hernandez, female   DOB: Jan 22, 1961, 63 y.o.   MRN: [id number]  Adherence Monitoring:  -Second outreach attempt unsuccessful, left voicemail to return call. Will attempt again next week and coordinate refills with PCP.  Flint Hummer, PharmD

## 2023-10-01 ENCOUNTER — Ambulatory Visit: Payer: 59 | Attending: Cardiology | Admitting: Cardiology

## 2023-10-01 ENCOUNTER — Encounter: Payer: Self-pay | Admitting: Cardiology

## 2023-10-01 VITALS — BP 135/83 | HR 56 | Ht 61.0 in

## 2023-10-01 DIAGNOSIS — I152 Hypertension secondary to endocrine disorders: Secondary | ICD-10-CM

## 2023-10-01 DIAGNOSIS — E1159 Type 2 diabetes mellitus with other circulatory complications: Secondary | ICD-10-CM

## 2023-10-01 DIAGNOSIS — I2510 Atherosclerotic heart disease of native coronary artery without angina pectoris: Secondary | ICD-10-CM

## 2023-10-01 DIAGNOSIS — I615 Nontraumatic intracerebral hemorrhage, intraventricular: Secondary | ICD-10-CM | POA: Diagnosis not present

## 2023-10-01 NOTE — Patient Instructions (Signed)
 Medication Instructions:  Your physician recommends that you continue on your current medications as directed. Please refer to the Current Medication list given to you today.  *If you need a refill on your cardiac medications before your next appointment, please call your pharmacy*  Lab Work: If you have labs (blood work) drawn today and your tests are completely normal, you will receive your results only by: MyChart Message (if you have MyChart) OR A paper copy in the mail If you have any lab test that is abnormal or we need to change your treatment, we will call you to review the results.  Testing/Procedures: None ordered today.  Follow-Up: At [facility], you and your health needs are our priority.  As part of our continuing mission to provide you with exceptional heart care, our providers are all part of one team.  This team includes your primary Cardiologist (physician) and Advanced Practice Providers or APPs (Physician Assistants and Nurse Practitioners) who all work together to provide you with the care you need, when you need it.  Your next appointment:   As needed  Provider:   Dorothye Gathers, MD    We recommend signing up for the patient portal called "MyChart".  Sign up information is provided on this After Visit Summary.  MyChart is used to connect with patients for Virtual Visits (Telemedicine).  Patients are able to view lab/test results, encounter notes, upcoming appointments, etc.  Non-urgent messages can be sent to your provider as well.   To learn more about what you can do with MyChart, go to [email or web address].

## 2023-10-01 NOTE — Progress Notes (Signed)
 Cardiology Office Note:  .   Date:  10/01/2023  ID:  Haley Hernandez, DOB Jun 17, 1960, MRN [id number] PCP: Omie Bickers, MD  [facility] Providers Cardiologist:  Dorothye Gathers, MD     History of Present Illness: .   Haley Hernandez is a 63 y.o. female Discussed the use of AI scribe software for clinical note transcription with the patient, who gave verbal consent to proceed.  History of Present Illness Haley Hernandez is a 63 year old female with hypertension and prior stroke who presents for cardiovascular follow-up.  She has a history of difficult to control hypertension, currently managed with clonidine  patch 0.2 mg and hydralazine  100 mg twice a day. Hydrochlorothiazide  25 mg daily was recently discontinued, and her blood pressure remains stable.  She experienced a prior stroke resulting in limited movement in her left arm and the need for a brace on her left leg. She has occasional muscle jerking and pain in the affected areas, with no movement in her left arm since the stroke. She experiences stress related to her condition.  She has obstructive sleep apnea, a prior gastrointestinal bleed with no recurrence, and moderate left ventricular hypertrophy. An echocardiogram in 2021 showed an ejection fraction of 70% with grade one diastolic dysfunction. A coronary CT in November 2021 revealed a calcium  score of 2 with minimal stenosis.  She reports occasional tingling in the left chest area. Her current medications include rosuvastatin  10 mg for cholesterol management, with an LDL level of 53. Recent lab results show an A1c of 5.6, hemoglobin of 12, and creatinine of 0.9. No movement in her left arm since the stroke.      ROS: No CP, no SOB  Studies Reviewed: Aaron Aas    EKG Interpretation Date/Time:  Thursday Oct 01 2023 09:26:10 EDT Ventricular Rate:  56 PR Interval:  194 QRS Duration:  88 QT Interval:  442 QTC Calculation: 426 R Axis:   -5  Text Interpretation: Sinus  bradycardia Poor R wave progression , age undetermined When compared with ECG of 19-Sep-2020 10:03, No significant change since last tracing Confirmed by Dorothye Gathers ([id number]) on 10/01/2023 9:28:12 AM    Results LABS LDL: 53 mg/dL X9J: 4.7% Hemoglobin: 12 g/dL Creatinine: 0.9 mg/dL  RADIOLOGY Coronary CT: Calcium  score 2, minimal stenosis (03/2020)  DIAGNOSTIC Echocardiography: EF 70%, grade 1 diastolic dysfunction (2021)  Risk Assessment/Calculations:            Physical Exam:   VS:  BP 135/83   Pulse (!) 56   Ht 5\' 1"  (1.549 m)   SpO2 97%   BMI 27.36 kg/m    Wt Readings from Last 3 Encounters:  12/06/21 144 lb 12.8 oz (65.7 kg)  07/29/21 160 lb (72.6 kg)  09/20/20 157 lb (71.2 kg)    GEN: Well nourished, well developed in no acute distress NECK: No JVD; No carotid bruits CARDIAC: RRR, no murmurs, no rubs, no gallops RESPIRATORY:  Clear to auscultation without rales, wheezing or rhonchi  ABDOMEN: Soft, non-tender, non-distended EXTREMITIES:  No edema; Left side weakness, left leg brace  ASSESSMENT AND PLAN: .    Assessment and Plan Assessment & Plan Hypertension Hypertension is well-controlled with clonidine  patch and hydralazine . Blood pressure readings are within target range. Hydrochlorothiazide  was discontinued without adverse effects. - Continue clonidine  patch 0.2 mg/24hr transdermal once a week - Continue hydralazine  100 mg oral twice daily  Coronary artery calcification Coronary artery calcification is well-managed with rosuvastatin , maintaining LDL levels at 53 mg/dL. - Continue  rosuvastatin  10 mg oral daily  Left ventricular hypertrophy Moderate left ventricular hypertrophy is present. No specific changes or concerns were addressed during this encounter.  Stroke with residual left-sided weakness Residual left-sided weakness persists post-stroke. No movement in the left arm. Occasional muscle jerking is noted, common post-stroke. Emphasized the  importance of continuing rehabilitation exercises. - Encourage continuation of rehabilitation exercises for left-sided weakness  Obstructive sleep apnea Obstructive sleep apnea was not addressed in this encounter.         Dispo: Graduate from cardiology clinic. No changes made to medication. Please reach out if needed.   Signed, Dorothye Gathers, MD

## 2023-10-08 ENCOUNTER — Ambulatory Visit (INDEPENDENT_AMBULATORY_CARE_PROVIDER_SITE_OTHER): Payer: 59 | Admitting: Podiatry

## 2023-10-08 ENCOUNTER — Encounter: Payer: Self-pay | Admitting: Podiatry

## 2023-10-08 DIAGNOSIS — M79676 Pain in unspecified toe(s): Secondary | ICD-10-CM

## 2023-10-08 DIAGNOSIS — Z8673 Personal history of transient ischemic attack (TIA), and cerebral infarction without residual deficits: Secondary | ICD-10-CM | POA: Diagnosis not present

## 2023-10-08 DIAGNOSIS — B351 Tinea unguium: Secondary | ICD-10-CM | POA: Diagnosis not present

## 2023-10-08 DIAGNOSIS — E1149 Type 2 diabetes mellitus with other diabetic neurological complication: Secondary | ICD-10-CM | POA: Diagnosis not present

## 2023-10-08 NOTE — Progress Notes (Signed)
 This patient returns to my office for at risk foot care.  This patient requires this care by a professional since this patient will be at risk due to having CVA and Type 2 diabetes. This patient is unable to cut nails himself since the patient cannot reach his nails.These nails are painful walking and wearing shoes.  This patient presents for at risk foot care today.  General Appearance  Alert, conversant and in no acute stress.  Vascular  Dorsalis pedis and posterior tibial  pulses are palpable  bilaterally.  Capillary return is within normal limits  bilaterally. Temperature is within normal limits  bilaterally.  Neurologic  Senn-Weinstein monofilament wire test diminished  bilaterally. Muscle power within normal limits bilaterally.  Nails Thick disfigured discolored nails with subungual debris  from hallux to fifth toes bilaterally. No evidence of bacterial infection or drainage bilaterally.  Orthopedic  No limitations of motion  feet .  No crepitus or effusions noted.  No bony pathology or digital deformities noted.  Skin  normotropic skin with no porokeratosis noted bilaterally.  No signs of infections or ulcers noted.     Onychomycosis  Pain in right toes  Pain in left toes  Consent was obtained for treatment procedures.   Mechanical debridement of nails 1-5  bilaterally performed with a nail nipper.  Filed with dremel without incident.    Return office visit    4 months                  Told patient to return for periodic foot care and evaluation due to potential at risk complications.   Ruffin Cotton DPM

## 2023-10-13 ENCOUNTER — Telehealth: Payer: Self-pay | Admitting: Cardiology

## 2023-10-13 MED ORDER — ROSUVASTATIN CALCIUM 10 MG PO TABS: 10.0000 mg | ORAL_TABLET | Freq: Every day | ORAL | 3 refills | Status: AC

## 2023-10-13 NOTE — Telephone Encounter (Signed)
*  STAT* If patient is at the pharmacy, call can be transferred to refill team.   1. Which medications need to be refilled? (please list name of each medication and dose if known) rosuvastatin  (CRESTOR ) 10 MG tablet   2. Which pharmacy/location (including street and city if local pharmacy) is medication to be sent to?  [facility] - [place], [place] - [address]    3. Do they need a 30 day or 90 day supply? 90

## 2023-10-13 NOTE — Telephone Encounter (Signed)
 Pt's medication was sent to pt's pharmacy as requested. Confirmation received.

## 2023-11-09 ENCOUNTER — Other Ambulatory Visit: Payer: Self-pay | Admitting: Internal Medicine

## 2023-11-09 DIAGNOSIS — Z1231 Encounter for screening mammogram for malignant neoplasm of breast: Secondary | ICD-10-CM

## 2023-11-12 ENCOUNTER — Other Ambulatory Visit (HOSPITAL_COMMUNITY): Payer: Self-pay | Admitting: Nurse Practitioner

## 2023-11-12 DIAGNOSIS — Z13820 Encounter for screening for osteoporosis: Secondary | ICD-10-CM

## 2023-11-16 ENCOUNTER — Inpatient Hospital Stay (HOSPITAL_BASED_OUTPATIENT_CLINIC_OR_DEPARTMENT_OTHER): Admission: RE | Admit: 2023-11-16 | Source: Ambulatory Visit | Admitting: Radiology

## 2023-11-16 DIAGNOSIS — Z1231 Encounter for screening mammogram for malignant neoplasm of breast: Secondary | ICD-10-CM

## 2023-11-20 ENCOUNTER — Ambulatory Visit (HOSPITAL_COMMUNITY)
Admission: RE | Admit: 2023-11-20 | Discharge: 2023-11-20 | Disposition: A | Discharge status: Home / Self Care | Source: Ambulatory Visit | Attending: Nurse Practitioner | Admitting: Nurse Practitioner

## 2023-11-20 DIAGNOSIS — Z780 Asymptomatic menopausal state: Secondary | ICD-10-CM | POA: Diagnosis not present

## 2023-11-20 DIAGNOSIS — M810 Age-related osteoporosis without current pathological fracture: Secondary | ICD-10-CM | POA: Insufficient documentation

## 2023-11-20 DIAGNOSIS — Z13820 Encounter for screening for osteoporosis: Secondary | ICD-10-CM | POA: Diagnosis present

## 2023-12-04 ENCOUNTER — Ambulatory Visit

## 2023-12-10 ENCOUNTER — Ambulatory Visit
Admission: RE | Admit: 2023-12-10 | Discharge: 2023-12-10 | Disposition: A | Discharge status: Home / Self Care | Source: Ambulatory Visit | Attending: Internal Medicine | Admitting: Internal Medicine

## 2023-12-10 ENCOUNTER — Encounter: Payer: Self-pay | Admitting: Cardiology

## 2023-12-10 DIAGNOSIS — Z1231 Encounter for screening mammogram for malignant neoplasm of breast: Secondary | ICD-10-CM

## 2023-12-14 ENCOUNTER — Other Ambulatory Visit: Payer: Self-pay | Admitting: Internal Medicine

## 2023-12-14 DIAGNOSIS — R928 Other abnormal and inconclusive findings on diagnostic imaging of breast: Secondary | ICD-10-CM

## 2023-12-22 ENCOUNTER — Inpatient Hospital Stay
Admission: RE | Admit: 2023-12-22 | Discharge: 2023-12-22 | Discharge status: Home / Self Care | Source: Ambulatory Visit | Attending: Internal Medicine | Admitting: Internal Medicine

## 2023-12-22 ENCOUNTER — Other Ambulatory Visit: Payer: Self-pay | Admitting: Internal Medicine

## 2023-12-22 DIAGNOSIS — R928 Other abnormal and inconclusive findings on diagnostic imaging of breast: Secondary | ICD-10-CM

## 2023-12-22 DIAGNOSIS — R921 Mammographic calcification found on diagnostic imaging of breast: Secondary | ICD-10-CM

## 2024-01-11 ENCOUNTER — Other Ambulatory Visit

## 2024-01-11 ENCOUNTER — Encounter

## 2024-01-15 ENCOUNTER — Encounter: Payer: Self-pay | Admitting: Podiatry

## 2024-01-15 ENCOUNTER — Ambulatory Visit (INDEPENDENT_AMBULATORY_CARE_PROVIDER_SITE_OTHER)

## 2024-01-15 ENCOUNTER — Ambulatory Visit: Admitting: Podiatry

## 2024-01-15 DIAGNOSIS — M79676 Pain in unspecified toe(s): Secondary | ICD-10-CM | POA: Diagnosis not present

## 2024-01-15 DIAGNOSIS — M7752 Other enthesopathy of left foot: Secondary | ICD-10-CM

## 2024-01-15 DIAGNOSIS — B351 Tinea unguium: Secondary | ICD-10-CM | POA: Diagnosis not present

## 2024-01-15 DIAGNOSIS — M2062 Acquired deformities of toe(s), unspecified, left foot: Secondary | ICD-10-CM

## 2024-01-15 DIAGNOSIS — E1149 Type 2 diabetes mellitus with other diabetic neurological complication: Secondary | ICD-10-CM | POA: Diagnosis not present

## 2024-01-15 DIAGNOSIS — Z8673 Personal history of transient ischemic attack (TIA), and cerebral infarction without residual deficits: Secondary | ICD-10-CM

## 2024-01-15 NOTE — Progress Notes (Addendum)
 This patient returns to my office for at risk foot care.  This patient requires this care by a professional since this patient will be at risk due to having CVA and Type 2 diabetes. This patient is unable to cut nails himself since the patient cannot reach his nails.These nails are painful walking and wearing shoes.  This patient presents for at risk foot care today.   General Appearance  Alert, conversant and in no acute stress.  Vascular  Dorsalis pedis and posterior tibial  pulses are palpable  bilaterally.  Capillary return is within normal limits  bilaterally. Temperature is within normal limits  bilaterally.  Neurologic  Senn-Weinstein monofilament wire test diminished  bilaterally. Muscle power within normal limits bilaterally.  Nails Thick disfigured discolored nails with subungual debris  from hallux to fifth toes bilaterally. No evidence of bacterial infection or drainage bilaterally.  Orthopedic  No limitations of motion  feet .  No crepitus or effusions noted.  No bony pathology or digital deformities noted. Toe deformity at middle phalanx 2,3  left.  Skin  normotropic skin with no porokeratosis noted bilaterally.  No signs of infections or ulcers noted.     Onychomycosis  Pain in right toes  Pain in left toes  Toe deformity.  Consent was obtained for treatment procedures.   Mechanical debridement of nails 1-5  bilaterally performed with a nail nipper.  Filed with dremel without incident. Xrays reveal digital deformity at middle phalanx.  Dr.  Tobie discussed this condition with patient. The toe deformity was noted during nail care left foot.The toe deformity seems to be her toe walking without shoes due to her CVA.   Return office visit    4 months                  Told patient to return for periodic foot care and evaluation due to potential at risk complications.   Cordella Bold DPM

## 2024-01-16 NOTE — Progress Notes (Addendum)
 [facility] CONSULT NOTE  Patient Care Team: Shona Norleen PEDLAR, MD as PCP - General (Internal Medicine) Jeffrie Oneil BROCKS, MD as PCP - Cardiology (Cardiology)  Addendum EGD reviewed dated 01/14/2024 Gastritis characterized by congestion edema, erythema and linear erosion.  Biopsy. A single mucosal papule nodule found in the stomach biopsy. Normal examined duodenum.  ASSESSMENT & PLAN:  Haley Hernandez is a 63 y.o. female with history of DM2, hemorrhagic CVA with baseline left-sided deficit, HTN, HLD being seen for iron deficiency anemia.  Iron saturation was 6% in Aug with microcytic anemia worsened since January 2025.  The mechanism of IDA is due to either blood loss or decreased absorptive mechanism or both.  Medically, patient had normal colonoscopy within the last few years.  Report recent EGD showed inflammation.  This likely is gastritis.  She has not tried oral iron. We discussed some of the risks, benefits, and alternatives of intravenous iron infusions.  Some of the side-effects to be expected including risks of infusion reactions, phlebitis, headaches, nausea and fatigue.  She will give a trial of oral iron first.  If cannot tolerate, her daughter will call us  for IV iron. Assessment & Plan Iron deficiency anemia secondary to inadequate dietary iron intake Oral iron once daily CBC, Iron, TIBC, ferritin in January Follow-up few days after lab in January Will fax lab to [facility]  Orders Placed This Encounter  Procedures   CBC with Differential (Cancer Center Only)    Standing Status:   Future    Expiration Date:   01/17/2025   Iron and Iron Binding Capacity ([facility] only)    Standing Status:   Future    Expiration Date:   01/17/2025   Ferritin    Standing Status:   Future    Expiration Date:   01/17/2025   Last colonoscopy: 04/2022. Last EGD: 01/2024. Report inflammation. No bleeding.   All questions were answered. The patient knows to call the clinic with any problems,  questions or concerns.  Pauletta BROCKS [place], MD 9/15/202512:37 PM   CHIEF COMPLAINTS/PURPOSE OF CONSULTATION:  Anemia  HISTORY OF PRESENTING ILLNESS:  Haley Hernandez 63 y.o. female is here because of anemia.  Laboratory studies show history of anemia.  From home health first documented iron deficiency with 2009.  Most recent labs from outside records dated 8//25.  Iron saturation was 6%.  MCV 74 hemoglobin 9.4.  RDW 16.7.  Previous lab data in January 2025 showed normal hemoglobin of 12.  MCV of 84.  RDW was normal at 13.6.  Tilda had not noticed any recent bleeding such as melena, hematuria or hematochezia Her last colonoscopy was 2023.  Patient denies any other bleeding she has not tried any oral supplements.  No abdominal pain, GI or bowel changes.  No other new systemic symptoms.  Baseline left-sided weakness from history of stroke.  MEDICAL HISTORY:  Past Medical History:  Diagnosis Date   COPD 06/27/2008   Qualifier: Diagnosis of  By: Shellia MD, Vineet     Diabetes mellitus without complication (HCC)    Hypertension    IVH (intraventricular hemorrhage) (HCC) 07/18/2019   OBSTRUCTIVE SLEEP APNEA 06/27/2008   Qualifier: Diagnosis of  By: Shellia MD, Vineet     Stroke [facility])     SURGICAL HISTORY: Past Surgical History:  Procedure Laterality Date   ABDOMINAL HYSTERECTOMY     BIOPSY  02/28/2020   Procedure: BIOPSY;  Surgeon: Dianna Specking, MD;  Location: [facility];  Service: Endoscopy;;   COLONOSCOPY N/A 02/28/2020  Procedure: COLONOSCOPY;  Surgeon: Dianna Specking, MD;  Location: [facility];  Service: Endoscopy;  Laterality: N/A;   COLONOSCOPY WITH PROPOFOL  N/A 04/15/2022   Procedure: COLONOSCOPY WITH PROPOFOL ;  Surgeon: Dianna Specking, MD;  Location: [facility];  Service: Gastroenterology;  Laterality: N/A;   IR GASTROSTOMY TUBE MOD SED  08/03/2019   IR GASTROSTOMY TUBE REMOVAL  11/21/2019    SOCIAL HISTORY: Social History   Socioeconomic History   Marital  status: Single    Spouse name: Not on file   Number of children: Not on file   Years of education: Not on file   Highest education level: Not on file  Occupational History   Not on file  Tobacco Use   Smoking status: Never   Smokeless tobacco: Never  Substance and Sexual Activity   Alcohol use: Not Currently   Drug use: Not Currently   Sexual activity: Not on file  Other Topics Concern   Not on file  Social History Narrative   Not on file   Social Drivers of Health   Financial Resource Strain: Not on file  Food Insecurity: No Food Insecurity (01/18/2024)   Hunger Vital Sign    Worried About Running Out of Food in the Last Year: Never true    Ran Out of Food in the Last Year: Never true  Transportation Needs: No Transportation Needs (01/18/2024)   PRAPARE - Administrator, Civil Service (Medical): No    Lack of Transportation (Non-Medical): No  Physical Activity: Not on file  Stress: Not on file  Social Connections: Not on file  Intimate Partner Violence: Not At Risk (01/18/2024)   Humiliation, Afraid, Rape, and Kick questionnaire    Fear of Current or Ex-Partner: No    Emotionally Abused: No    Physically Abused: No    Sexually Abused: No    FAMILY HISTORY: Family History  Problem Relation Age of Onset   Hypertension Father    Diabetes Father     ALLERGIES:  is allergic to losartan , norvasc  [amlodipine ], ace inhibitors, and penicillins.  MEDICATIONS:  Current Outpatient Medications  Medication Sig Dispense Refill   Baclofen  5 MG TABS Take 5 mg by mouth in the morning AND 10 mg at bedtime. 90 tablet 11   Cholecalciferol  (D3-1000) 25 MCG (1000 UT) tablet Take 1,000 Units by mouth daily.     cloNIDine  (CATAPRES  - DOSED IN MG/24 HR) 0.2 mg/24hr patch Place 0.2 mg onto the skin once a week.     ferrous gluconate  (FERGON) 324 MG tablet Take 1 tablet (324 mg total) by mouth daily with breakfast. 90 tablet 3   rosuvastatin  (CRESTOR ) 10 MG tablet Take 1 tablet  (10 mg total) by mouth daily. 90 tablet 3   hydrALAZINE  (APRESOLINE ) 100 MG tablet Take 1 tablet (100 mg total) by mouth 2 (two) times daily. 60 tablet 0   No current facility-administered medications for this visit.    REVIEW OF SYSTEMS:   All relevant systems were reviewed with the patient and are negative.  PHYSICAL EXAMINATION: Vitals:   01/18/24 1208  BP: 135/80  Pulse: (!) 107  Resp: 18  Temp: 97.7 F (36.5 C)  SpO2: 100%   Filed Weights   01/18/24 1208  Weight: 121 lb 12.8 oz (55.2 kg)    GENERAL: alert, no distress and comfortable on wheelchair EYES: normal conjunctiva, sclera clear LUNGS: normal breathing effort HEART: regular rate & rhythm ABDOMEN: abdomen soft, non-tender and nondistended   RADIOGRAPHIC STUDIES: I have personally  reviewed the radiological images as listed and agreed with the findings in the report. DG Foot Complete Left Result Date: 01/15/2024 Please see detailed radiograph report in office note.  MM Digital Diagnostic Unilat L Result Date: 12/22/2023 CLINICAL DATA:  Recall from screening mammography, 2 sets of calcifications involving the LEFT breast. EXAM: DIGITAL DIAGNOSTIC UNILATERAL LEFT MAMMOGRAM WITH CAD TECHNIQUE: Left digital diagnostic mammography was performed. The patient had a stroke and is in wheelchair and had difficulty standing for the lateral images. COMPARISON:  Previous exam(s). ACR Breast Density Category c: The breasts are heterogeneously dense, which may obscure small masses. FINDINGS: Spot compression CC, mediolateral and MLO views of the calcifications and a full field mediolateral view were obtained. 0.3 cm group of likely benign coarse dystrophic calcifications in the UPPER INNER QUADRANT at middle to posterior depth. There are no suspicious linear or branching forms. The magnification lateral and MLO images had significant motion. The magnification CC view demonstrates a 0.5 cm group of calcifications superficially in the  lower breast at middle depth which have a round/punctate and amorphous morphology. There are no suspicious linear or branching forms. IMPRESSION: Two groups of likely benign calcifications in the LEFT breast. RECOMMENDATION: Diagnostic LEFT mammogram to include spot magnification views of the calcifications in 6 months. I have discussed the findings and recommendations with the patient and. If applicable, a reminder letter will be sent to the patient regarding the next appointment. BI-RADS CATEGORY  3: Probably benign. Electronically Signed   By: Debby Satterfield M.D.   On: 12/22/2023 10:54

## 2024-01-18 ENCOUNTER — Telehealth: Payer: Self-pay

## 2024-01-18 ENCOUNTER — Inpatient Hospital Stay

## 2024-01-18 ENCOUNTER — Other Ambulatory Visit

## 2024-01-18 VITALS — BP 135/80 | HR 107 | Temp 97.7°F | Resp 18 | Wt 121.8 lb

## 2024-01-18 DIAGNOSIS — G4733 Obstructive sleep apnea (adult) (pediatric): Secondary | ICD-10-CM | POA: Insufficient documentation

## 2024-01-18 DIAGNOSIS — I10 Essential (primary) hypertension: Secondary | ICD-10-CM | POA: Insufficient documentation

## 2024-01-18 DIAGNOSIS — D508 Other iron deficiency anemias: Secondary | ICD-10-CM | POA: Insufficient documentation

## 2024-01-18 DIAGNOSIS — Z79899 Other long term (current) drug therapy: Secondary | ICD-10-CM | POA: Diagnosis not present

## 2024-01-18 DIAGNOSIS — E785 Hyperlipidemia, unspecified: Secondary | ICD-10-CM | POA: Insufficient documentation

## 2024-01-18 DIAGNOSIS — E119 Type 2 diabetes mellitus without complications: Secondary | ICD-10-CM | POA: Insufficient documentation

## 2024-01-18 MED ORDER — FERROUS GLUCONATE 324 (38 FE) MG PO TABS: 324.0000 mg | ORAL_TABLET | Freq: Every day | ORAL | 3 refills | Status: AC

## 2024-01-18 NOTE — Telephone Encounter (Signed)
 Scheduled appointments per 9/15 los. Talked with the patients daughter and she is aware of the made appointments for the patient.

## 2024-01-22 ENCOUNTER — Telehealth: Payer: Self-pay | Admitting: *Deleted

## 2024-01-22 NOTE — Telephone Encounter (Signed)
 LM on 9/15 and 9/19 to get EGD scanned into our system or fax report to us .

## 2024-01-26 ENCOUNTER — Encounter: Payer: Self-pay | Admitting: Gastroenterology

## 2024-02-11 ENCOUNTER — Ambulatory Visit: Admitting: Podiatry

## 2024-05-11 ENCOUNTER — Telehealth: Payer: Self-pay

## 2024-05-11 NOTE — Telephone Encounter (Signed)
 I received a voicemail from the patients daughter needing to reschedule her mothers appts in January. I called and spoke with the daughter, I moved the appts to February. She is aware.

## 2024-05-16 ENCOUNTER — Ambulatory Visit: Admitting: Podiatry

## 2024-05-19 ENCOUNTER — Ambulatory Visit

## 2024-05-19 ENCOUNTER — Other Ambulatory Visit

## 2024-06-20 ENCOUNTER — Ambulatory Visit: Admitting: Podiatry

## 2024-06-21 ENCOUNTER — Inpatient Hospital Stay

## 2024-06-27 ENCOUNTER — Encounter
